# Patient Record
Sex: Female | Born: 1945
Health system: Southern US, Community
[De-identification: ages and names within clinical notes are randomized; demographics above are authoritative.]

## PROBLEM LIST (undated history)

## (undated) DIAGNOSIS — G4733 Obstructive sleep apnea (adult) (pediatric): Secondary | ICD-10-CM

## (undated) DIAGNOSIS — Z9989 Dependence on other enabling machines and devices: Secondary | ICD-10-CM

## (undated) DIAGNOSIS — Z7901 Long term (current) use of anticoagulants: Secondary | ICD-10-CM

## (undated) DIAGNOSIS — Z9889 Other specified postprocedural states: Secondary | ICD-10-CM

## (undated) DIAGNOSIS — C801 Malignant (primary) neoplasm, unspecified: Secondary | ICD-10-CM

## (undated) DIAGNOSIS — R112 Nausea with vomiting, unspecified: Secondary | ICD-10-CM

## (undated) DIAGNOSIS — K5792 Diverticulitis of intestine, part unspecified, without perforation or abscess without bleeding: Secondary | ICD-10-CM

## (undated) DIAGNOSIS — Z8541 Personal history of malignant neoplasm of cervix uteri: Secondary | ICD-10-CM

## (undated) DIAGNOSIS — I509 Heart failure, unspecified: Secondary | ICD-10-CM

## (undated) DIAGNOSIS — K219 Gastro-esophageal reflux disease without esophagitis: Secondary | ICD-10-CM

## (undated) DIAGNOSIS — Z8679 Personal history of other diseases of the circulatory system: Secondary | ICD-10-CM

## (undated) DIAGNOSIS — K449 Diaphragmatic hernia without obstruction or gangrene: Secondary | ICD-10-CM

## (undated) DIAGNOSIS — E119 Type 2 diabetes mellitus without complications: Secondary | ICD-10-CM

## (undated) DIAGNOSIS — Z853 Personal history of malignant neoplasm of breast: Secondary | ICD-10-CM

## (undated) DIAGNOSIS — M1990 Unspecified osteoarthritis, unspecified site: Secondary | ICD-10-CM

## (undated) DIAGNOSIS — Z85828 Personal history of other malignant neoplasm of skin: Secondary | ICD-10-CM

## (undated) DIAGNOSIS — J9601 Acute respiratory failure with hypoxia: Secondary | ICD-10-CM

## (undated) DIAGNOSIS — N189 Chronic kidney disease, unspecified: Secondary | ICD-10-CM

## (undated) DIAGNOSIS — N3020 Other chronic cystitis without hematuria: Secondary | ICD-10-CM

## (undated) DIAGNOSIS — E039 Hypothyroidism, unspecified: Secondary | ICD-10-CM

## (undated) DIAGNOSIS — Z794 Long term (current) use of insulin: Secondary | ICD-10-CM

## (undated) DIAGNOSIS — Z8719 Personal history of other diseases of the digestive system: Secondary | ICD-10-CM

## (undated) DIAGNOSIS — Z973 Presence of spectacles and contact lenses: Secondary | ICD-10-CM

## (undated) DIAGNOSIS — E785 Hyperlipidemia, unspecified: Secondary | ICD-10-CM

## (undated) DIAGNOSIS — N393 Stress incontinence (female) (male): Secondary | ICD-10-CM

## (undated) DIAGNOSIS — I739 Peripheral vascular disease, unspecified: Secondary | ICD-10-CM

## (undated) DIAGNOSIS — G629 Polyneuropathy, unspecified: Secondary | ICD-10-CM

## (undated) HISTORY — PX: ENDOVASCULAR STENT GRAFT (AAA): CATH118280

## (undated) HISTORY — DX: Acute respiratory failure with hypoxia: J96.01

## (undated) HISTORY — PX: TYMPANOPLASTY: SHX33

## (undated) HISTORY — PX: MASTECTOMY: SHX3

## (undated) HISTORY — PX: INCONTINENCE SURGERY: SHX676

## (undated) HISTORY — DX: Chronic kidney disease, unspecified: N18.9

## (undated) HISTORY — DX: Diverticulitis of intestine, part unspecified, without perforation or abscess without bleeding: K57.92

## (undated) HISTORY — DX: Gastro-esophageal reflux disease without esophagitis: K21.9

## (undated) HISTORY — PX: TONSILLECTOMY: SUR1361

## (undated) HISTORY — DX: Hyperlipidemia, unspecified: E78.5

---

## 1996-12-04 HISTORY — PX: WRIST GANGLION EXCISION: SUR520

## 1997-12-04 HISTORY — PX: CARPAL TUNNEL RELEASE: SHX101

## 1998-12-04 DIAGNOSIS — Z8541 Personal history of malignant neoplasm of cervix uteri: Secondary | ICD-10-CM

## 1998-12-04 DIAGNOSIS — I509 Heart failure, unspecified: Secondary | ICD-10-CM

## 1998-12-04 HISTORY — PX: ABDOMINAL HYSTERECTOMY: SHX81

## 1998-12-04 HISTORY — DX: Personal history of malignant neoplasm of cervix uteri: Z85.41

## 1998-12-04 HISTORY — DX: Heart failure, unspecified: I50.9

## 2011-06-26 DIAGNOSIS — R7302 Impaired glucose tolerance (oral): Secondary | ICD-10-CM | POA: Insufficient documentation

## 2011-09-15 DIAGNOSIS — Z8679 Personal history of other diseases of the circulatory system: Secondary | ICD-10-CM

## 2011-09-15 DIAGNOSIS — Z9889 Other specified postprocedural states: Secondary | ICD-10-CM | POA: Insufficient documentation

## 2011-09-15 HISTORY — DX: Other specified postprocedural states: Z98.890

## 2011-09-15 HISTORY — DX: Personal history of other diseases of the circulatory system: Z86.79

## 2011-11-23 ENCOUNTER — Encounter (INDEPENDENT_AMBULATORY_CARE_PROVIDER_SITE_OTHER): Payer: Medicare Other | Admitting: Family Medicine

## 2011-11-23 DIAGNOSIS — Z0000 Encounter for general adult medical examination without abnormal findings: Secondary | ICD-10-CM

## 2011-11-23 DIAGNOSIS — K219 Gastro-esophageal reflux disease without esophagitis: Secondary | ICD-10-CM

## 2011-11-23 DIAGNOSIS — M719 Bursopathy, unspecified: Secondary | ICD-10-CM

## 2011-12-16 ENCOUNTER — Ambulatory Visit (INDEPENDENT_AMBULATORY_CARE_PROVIDER_SITE_OTHER): Payer: Managed Care, Other (non HMO)

## 2011-12-16 DIAGNOSIS — E119 Type 2 diabetes mellitus without complications: Secondary | ICD-10-CM

## 2012-01-04 ENCOUNTER — Telehealth: Payer: Self-pay

## 2012-01-04 NOTE — Telephone Encounter (Signed)
OK to call in to pharmacy of patient's choice.  Metoprolol XR 25 mg 1 po qd #30 No rf and  Protonix 40mg  1 po qd #30 no rf.

## 2012-01-04 NOTE — Telephone Encounter (Signed)
.  [facility] PT [place] HER MEDS [place]  NEEDS ADDITIONAL PRESCRIPTION  [facility] [address] - [place]  CB [phone]

## 2012-01-04 NOTE — Telephone Encounter (Signed)
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  She needs her metoprolol XL 25mg  and protonix 40mg . Can we refill for her?  Chart at nurses desk

## 2012-01-04 NOTE — Telephone Encounter (Signed)
[facility] [address], [facility] notifying pt.

## 2012-01-07 ENCOUNTER — Telehealth: Payer: Self-pay

## 2012-01-07 NOTE — Telephone Encounter (Addendum)
PT REQUESTS RX FOR GLUCOSE METER AND STRIPS DOES NOT HAVE BRAND PREFERENCE   PT CALLED BACK TO LET us SHE USES THE TEST SCRIPTS TWICE A DAY. YOU MAY REACH HER AT [phone] IF NEEDED AND THE PHARMACY IS [facility] [address]

## 2012-01-07 NOTE — Telephone Encounter (Signed)
LMOM for patient to call back with which pharmacy she uses and how often she tests.

## 2012-01-08 NOTE — Telephone Encounter (Signed)
LMOM TO CB 

## 2012-01-08 NOTE — Telephone Encounter (Signed)
Please give OK for [place] to call in glucometer and test strips for pt.

## 2012-01-09 NOTE — Telephone Encounter (Signed)
Please call in glucometer of choice and appropriate strips so pt can test up to twice daily.  Thanks.

## 2012-01-10 NOTE — Telephone Encounter (Signed)
Called in Rx and spoke with pt to notify. D/W pt testing and BS levels. instr'd pt to write down readings and bring to OV w/ Dr Perrin Maltese. Pt agreed and will call if has questions before then.

## 2012-01-15 ENCOUNTER — Ambulatory Visit (INDEPENDENT_AMBULATORY_CARE_PROVIDER_SITE_OTHER): Payer: Managed Care, Other (non HMO) | Admitting: Family Medicine

## 2012-01-15 VITALS — BP 154/71 | HR 64 | Temp 98.0°F | Resp 16 | Ht 64.75 in | Wt 186.2 lb

## 2012-01-15 DIAGNOSIS — R309 Painful micturition, unspecified: Secondary | ICD-10-CM

## 2012-01-15 DIAGNOSIS — N390 Urinary tract infection, site not specified: Secondary | ICD-10-CM

## 2012-01-15 DIAGNOSIS — R300 Dysuria: Secondary | ICD-10-CM

## 2012-01-15 LAB — POCT UA - MICROSCOPIC ONLY
Casts, Ur, LPF, POC: NEGATIVE
Mucus, UA: NEGATIVE
Yeast, UA: NEGATIVE

## 2012-01-15 LAB — POCT URINALYSIS DIPSTICK
Spec Grav, UA: 1.02
Urobilinogen, UA: 0.2

## 2012-01-15 MED ORDER — SULFAMETHOXAZOLE-TRIMETHOPRIM 800-160 MG PO TABS: 1.0000 | ORAL_TABLET | Freq: Two times a day (BID) | ORAL | Status: AC

## 2012-01-15 NOTE — Patient Instructions (Signed)
Patient (or parent if minor) instructed to return to clinic or call if not better in 2 day(s). Sooner if worse.

## 2012-01-15 NOTE — Progress Notes (Signed)
Patient Name: Mandy Kim Date of Birth: 12-06-1945 Medical Record Number: [id number] Gender: female Date of Encounter: 01/15/2012  History of Present Illness:  Mandy Kim is a 66 y.o. very pleasant female patient who presents with the following:  Had a bladder tack 2 weeks ago by her Urologist, Dr. Wilburn Mylar in [place].  This was done ?due to frequent UTI.   Forgot to fill her septra rx-  Did well until she started to note UTI symptoms 4 days ago.  Hurts to urinate, blood in urine, urinary frequency.  These are her typical UTI symptoms.  No fever, nausea or vomiting.  Does note some right flank pain though.    There is no problem list on file for this patient.  No past medical history on file. No past surgical history on file. History  Substance Use Topics  . Smoking status: Never Smoker   . Smokeless tobacco: Not on file  . Alcohol Use: Not on file   No family history on file. Allergies  Allergen Reactions  . Aspirin   . Crestor (Rosuvastatin Calcium)   . Lipitor (Atorvastatin Calcium)   . Pravastatin   . Simvastatin     Medication list has been reviewed and updated.  Review of Systems: As per HPI- otherwise ok  Physical Examination: Filed Vitals:   01/15/12 0940  BP: 154/71  Pulse: 64  Temp: 98 F (36.7 C)  TempSrc: Oral  Resp: 16  Height: 5' 4.75" (1.645 m)  Weight: 186 lb 3.2 oz (84.46 kg)    Body mass index is 31.23 kg/(m^2).  GEN: WDWN, NAD, Non-toxic, A & O x 3, obese HEENT: Atraumatic, Normocephalic. Neck supple. No masses, No LAD. Ears and Nose: No external deformity. CV: RRR, No M/G/R. No JVD. No thrill. No extra heart sounds. PULM: CTA B, no wheezes, crackles, rhonchi. No retractions. No resp. distress. No accessory muscle use. ABD: S, NT, ND, +BS. No HSM.  No CVA tenderness EXTR: No c/c/e NEURO Normal gait.  PSYCH: Normally interactive. Conversant. Not depressed or anxious appearing.  Calm demeanor.   Results for orders  placed in visit on 01/15/12  POCT UA - MICROSCOPIC ONLY      Component Value Range   WBC, Ur, HPF, POC TNTC     RBC, urine, microscopic TNTC     Bacteria, U Microscopic 1+     Mucus, UA NEGATIVE     Epithelial cells, urine per micros 0-4     Crystals, Ur, HPF, POC NEGATIVE     Casts, Ur, LPF, POC NEGATIVE     Yeast, UA NEGATIVE    POCT URINALYSIS DIPSTICK      Component Value Range   Color, UA yellow     Clarity, UA cloudy     Glucose, UA negative     Bilirubin, UA negative     Ketones, UA negative     Spec Grav, UA 1.020     Blood, UA moderate     pH, UA 8.0     Protein, UA trace     Urobilinogen, UA 0.2     Nitrite, UA neative     Leukocytes, UA moderate (2+)       Assessment and Plan: 1. UTI (lower urinary tract infection)  sulfamethoxazole-trimethoprim (BACTRIM DS,SEPTRA DS) 800-160 MG per tablet, Urine culture  2. Painful urination  POCT UA - Microscopic Only, POCT Urinalysis Dipstick  patient with UTI after a recent bladder operation.  Will treat with antibiotics, urine culture pending. Instructed  her to let us know if she is not feeling better in short order!  Sooner if worse.

## 2012-01-17 ENCOUNTER — Encounter: Payer: Self-pay | Admitting: Family Medicine

## 2012-02-11 DIAGNOSIS — Z0271 Encounter for disability determination: Secondary | ICD-10-CM

## 2012-02-12 ENCOUNTER — Encounter: Payer: Self-pay | Admitting: Internal Medicine

## 2012-02-12 ENCOUNTER — Ambulatory Visit (INDEPENDENT_AMBULATORY_CARE_PROVIDER_SITE_OTHER): Payer: Managed Care, Other (non HMO) | Admitting: Internal Medicine

## 2012-02-12 DIAGNOSIS — E119 Type 2 diabetes mellitus without complications: Secondary | ICD-10-CM

## 2012-02-12 DIAGNOSIS — Z794 Long term (current) use of insulin: Secondary | ICD-10-CM

## 2012-02-12 DIAGNOSIS — E876 Hypokalemia: Secondary | ICD-10-CM

## 2012-02-12 DIAGNOSIS — E1142 Type 2 diabetes mellitus with diabetic polyneuropathy: Secondary | ICD-10-CM

## 2012-02-12 DIAGNOSIS — I714 Abdominal aortic aneurysm, without rupture, unspecified: Secondary | ICD-10-CM

## 2012-02-12 DIAGNOSIS — E785 Hyperlipidemia, unspecified: Secondary | ICD-10-CM

## 2012-02-12 DIAGNOSIS — I10 Essential (primary) hypertension: Secondary | ICD-10-CM

## 2012-02-12 DIAGNOSIS — Z79899 Other long term (current) drug therapy: Secondary | ICD-10-CM

## 2012-02-12 DIAGNOSIS — K760 Fatty (change of) liver, not elsewhere classified: Secondary | ICD-10-CM

## 2012-02-12 DIAGNOSIS — C50919 Malignant neoplasm of unspecified site of unspecified female breast: Secondary | ICD-10-CM | POA: Insufficient documentation

## 2012-02-12 DIAGNOSIS — C539 Malignant neoplasm of cervix uteri, unspecified: Secondary | ICD-10-CM

## 2012-02-12 DIAGNOSIS — R5381 Other malaise: Secondary | ICD-10-CM

## 2012-02-12 HISTORY — DX: Abdominal aortic aneurysm, without rupture, unspecified: I71.40

## 2012-02-12 HISTORY — DX: Type 2 diabetes mellitus with diabetic polyneuropathy: E11.42

## 2012-02-12 HISTORY — DX: Malignant neoplasm of unspecified site of unspecified female breast: C50.919

## 2012-02-12 HISTORY — DX: Fatty (change of) liver, not elsewhere classified: K76.0

## 2012-02-12 HISTORY — DX: Long term (current) use of insulin: Z79.4

## 2012-02-12 LAB — GLUCOSE, POCT (MANUAL RESULT ENTRY): POC Glucose: 82

## 2012-02-12 LAB — POCT URINALYSIS DIPSTICK
Ketones, UA: NEGATIVE
Protein, UA: NEGATIVE
Spec Grav, UA: 1.025
pH, UA: 7

## 2012-02-12 LAB — POCT UA - MICROSCOPIC ONLY: Crystals, Ur, HPF, POC: NEGATIVE

## 2012-02-12 NOTE — Progress Notes (Signed)
  Subjective:    Patient ID: Mandy Kim, female    DOB: Sep 05, 1946, 66 y.o.   MRN: [id number]  HPI New pt. Multiple problems, see list Drives a truck, feels good. New dx of NIDDM on new metformin 500mg  qd.   Review of Systems Will sched CPE    Objective:   Physical Exam  Normal      Results for orders placed in visit on 02/12/12  POCT UA - MICROSCOPIC ONLY      Component Value Range   WBC, Ur, HPF, POC 1-6     RBC, urine, microscopic 0-3     Bacteria, U Microscopic trace     Mucus, UA trace     Epithelial cells, urine per micros 0-5     Crystals, Ur, HPF, POC neg     Casts, Ur, LPF, POC neg     Yeast, UA neg    POCT URINALYSIS DIPSTICK      Component Value Range   Color, UA yellow     Clarity, UA clear     Glucose, UA neg     Bilirubin, UA neg     Ketones, UA neg     Spec Grav, UA 1.025     Blood, UA trace     pH, UA 7.0     Protein, UA neg     Urobilinogen, UA 1.0     Nitrite, UA neg     Leukocytes, UA Trace    GLUCOSE, POCT (MANUAL RESULT ENTRY)      Component Value Range   POC Glucose 82    POCT GLYCOSYLATED HEMOGLOBIN (HGB A1C)      Component Value Range   Hemoglobin A1C 6.5      Assessment & Plan:   Schedule CPE Refill all meds and test strips prn

## 2012-02-13 LAB — LIPID PANEL
Cholesterol: 248 mg/dL — ABNORMAL HIGH (ref 0–200)
Total CHOL/HDL Ratio: 8.6 Ratio

## 2012-02-13 LAB — CBC WITH DIFFERENTIAL/PLATELET
Basophils Absolute: 0.1 10*3/uL (ref 0.0–0.1)
Basophils Relative: 1 % (ref 0–1)
Hemoglobin: 13.6 g/dL (ref 12.0–15.0)
MCHC: 32.2 g/dL (ref 30.0–36.0)
Monocytes Relative: 8 % (ref 3–12)
Neutro Abs: 2.8 10*3/uL (ref 1.7–7.7)
Neutrophils Relative %: 39 % — ABNORMAL LOW (ref 43–77)
Platelets: 271 10*3/uL (ref 150–400)

## 2012-02-13 LAB — COMPREHENSIVE METABOLIC PANEL
AST: 45 U/L — ABNORMAL HIGH (ref 0–37)
Albumin: 5.1 g/dL (ref 3.5–5.2)
BUN: 19 mg/dL (ref 6–23)
Calcium: 9.8 mg/dL (ref 8.4–10.5)
Chloride: 107 mEq/L (ref 96–112)
Glucose, Bld: 92 mg/dL (ref 70–99)
Potassium: 4.2 mEq/L (ref 3.5–5.3)

## 2012-02-13 LAB — TSH: TSH: 2.406 u[IU]/mL (ref 0.350–4.500)

## 2012-02-19 ENCOUNTER — Encounter: Payer: Self-pay | Admitting: *Deleted

## 2012-02-22 ENCOUNTER — Ambulatory Visit: Payer: Managed Care, Other (non HMO)

## 2012-02-22 ENCOUNTER — Ambulatory Visit (INDEPENDENT_AMBULATORY_CARE_PROVIDER_SITE_OTHER): Payer: Managed Care, Other (non HMO) | Admitting: Family Medicine

## 2012-02-22 ENCOUNTER — Inpatient Hospital Stay (HOSPITAL_COMMUNITY)
Admission: EM | Admit: 2012-02-22 | Discharge: 2012-02-25 | DRG: 194 | Disposition: A | Discharge status: Home / Self Care | Payer: Managed Care, Other (non HMO) | Attending: Family Medicine | Admitting: Family Medicine

## 2012-02-22 VITALS — BP 118/71 | HR 98 | Temp 99.3°F | Resp 24 | Ht 64.0 in | Wt 169.8 lb

## 2012-02-22 DIAGNOSIS — J159 Unspecified bacterial pneumonia: Secondary | ICD-10-CM

## 2012-02-22 DIAGNOSIS — E119 Type 2 diabetes mellitus without complications: Secondary | ICD-10-CM

## 2012-02-22 DIAGNOSIS — J449 Chronic obstructive pulmonary disease, unspecified: Secondary | ICD-10-CM | POA: Diagnosis present

## 2012-02-22 DIAGNOSIS — J4489 Other specified chronic obstructive pulmonary disease: Secondary | ICD-10-CM | POA: Diagnosis present

## 2012-02-22 DIAGNOSIS — J438 Other emphysema: Secondary | ICD-10-CM

## 2012-02-22 DIAGNOSIS — J13 Pneumonia due to Streptococcus pneumoniae: Principal | ICD-10-CM | POA: Diagnosis present

## 2012-02-22 DIAGNOSIS — Z79899 Other long term (current) drug therapy: Secondary | ICD-10-CM

## 2012-02-22 DIAGNOSIS — R05 Cough: Secondary | ICD-10-CM

## 2012-02-22 DIAGNOSIS — E785 Hyperlipidemia, unspecified: Secondary | ICD-10-CM

## 2012-02-22 DIAGNOSIS — K7689 Other specified diseases of liver: Secondary | ICD-10-CM | POA: Diagnosis present

## 2012-02-22 DIAGNOSIS — I129 Hypertensive chronic kidney disease with stage 1 through stage 4 chronic kidney disease, or unspecified chronic kidney disease: Secondary | ICD-10-CM | POA: Diagnosis present

## 2012-02-22 DIAGNOSIS — J9690 Respiratory failure, unspecified, unspecified whether with hypoxia or hypercapnia: Secondary | ICD-10-CM

## 2012-02-22 DIAGNOSIS — N189 Chronic kidney disease, unspecified: Secondary | ICD-10-CM | POA: Diagnosis present

## 2012-02-22 DIAGNOSIS — J189 Pneumonia, unspecified organism: Secondary | ICD-10-CM

## 2012-02-22 DIAGNOSIS — G4733 Obstructive sleep apnea (adult) (pediatric): Secondary | ICD-10-CM

## 2012-02-22 DIAGNOSIS — E876 Hypokalemia: Secondary | ICD-10-CM | POA: Diagnosis present

## 2012-02-22 DIAGNOSIS — Z23 Encounter for immunization: Secondary | ICD-10-CM

## 2012-02-22 DIAGNOSIS — Z87891 Personal history of nicotine dependence: Secondary | ICD-10-CM

## 2012-02-22 DIAGNOSIS — Z8541 Personal history of malignant neoplasm of cervix uteri: Secondary | ICD-10-CM

## 2012-02-22 DIAGNOSIS — R634 Abnormal weight loss: Secondary | ICD-10-CM | POA: Diagnosis present

## 2012-02-22 DIAGNOSIS — R059 Cough, unspecified: Secondary | ICD-10-CM

## 2012-02-22 DIAGNOSIS — R0902 Hypoxemia: Secondary | ICD-10-CM

## 2012-02-22 DIAGNOSIS — E871 Hypo-osmolality and hyponatremia: Secondary | ICD-10-CM | POA: Diagnosis present

## 2012-02-22 DIAGNOSIS — Z7902 Long term (current) use of antithrombotics/antiplatelets: Secondary | ICD-10-CM

## 2012-02-22 DIAGNOSIS — Z853 Personal history of malignant neoplasm of breast: Secondary | ICD-10-CM

## 2012-02-22 DIAGNOSIS — E860 Dehydration: Secondary | ICD-10-CM

## 2012-02-22 HISTORY — DX: Malignant (primary) neoplasm, unspecified: C80.1

## 2012-02-22 LAB — POCT CBC
Granulocyte percent: 70.8 %G (ref 37–80)
HCT, POC: 40.9 % (ref 37.7–47.9)
Hemoglobin: 13.6 g/dL (ref 12.2–16.2)
Lymph, poc: 2.6 (ref 0.6–3.4)
MCH, POC: 28.9 pg (ref 27–31.2)
MCHC: 33.3 g/dL (ref 31.8–35.4)
MCV: 87 fL (ref 80–97)
MID (cbc): 0.8 (ref 0–0.9)
MPV: 9.6 fL (ref 0–99.8)
POC Granulocyte: 8.2 — AB (ref 2–6.9)
POC LYMPH PERCENT: 22.2 %L (ref 10–50)
POC MID %: 7 %M (ref 0–12)
Platelet Count, POC: 224 10*3/uL (ref 142–424)
RBC: 4.7 M/uL (ref 4.04–5.48)
RDW, POC: 16.5 %
WBC: 11.6 10*3/uL — AB (ref 4.6–10.2)

## 2012-02-22 LAB — CBC
Hemoglobin: 12.9 g/dL (ref 12.0–15.0)
MCHC: 34.4 g/dL (ref 30.0–36.0)
RBC: 4.4 MIL/uL (ref 3.87–5.11)
WBC: 10.2 10*3/uL (ref 4.0–10.5)

## 2012-02-22 LAB — BASIC METABOLIC PANEL
CO2: 21 mEq/L (ref 19–32)
Chloride: 100 mEq/L (ref 96–112)
GFR calc non Af Amer: 69 mL/min — ABNORMAL LOW (ref >90)
Glucose, Bld: 130 mg/dL — ABNORMAL HIGH (ref 70–99)
Potassium: 3.2 mEq/L — ABNORMAL LOW (ref 3.5–5.1)
Sodium: 131 mEq/L — ABNORMAL LOW (ref 135–145)

## 2012-02-22 LAB — GLUCOSE, CAPILLARY: Glucose-Capillary: 107 mg/dL — ABNORMAL HIGH (ref 70–99)

## 2012-02-22 LAB — GLUCOSE, POCT (MANUAL RESULT ENTRY): POC Glucose: 135

## 2012-02-22 IMAGING — CR DG CHEST 2V
2 series · 2 of 2 positions shown · non-contrast
Comparison: [DATE]

CLINICAL DATA: Cough.  Hypertension.  Diabetes.  History breast and
cervical cancer.

CHEST - 2 VIEW

[PA]
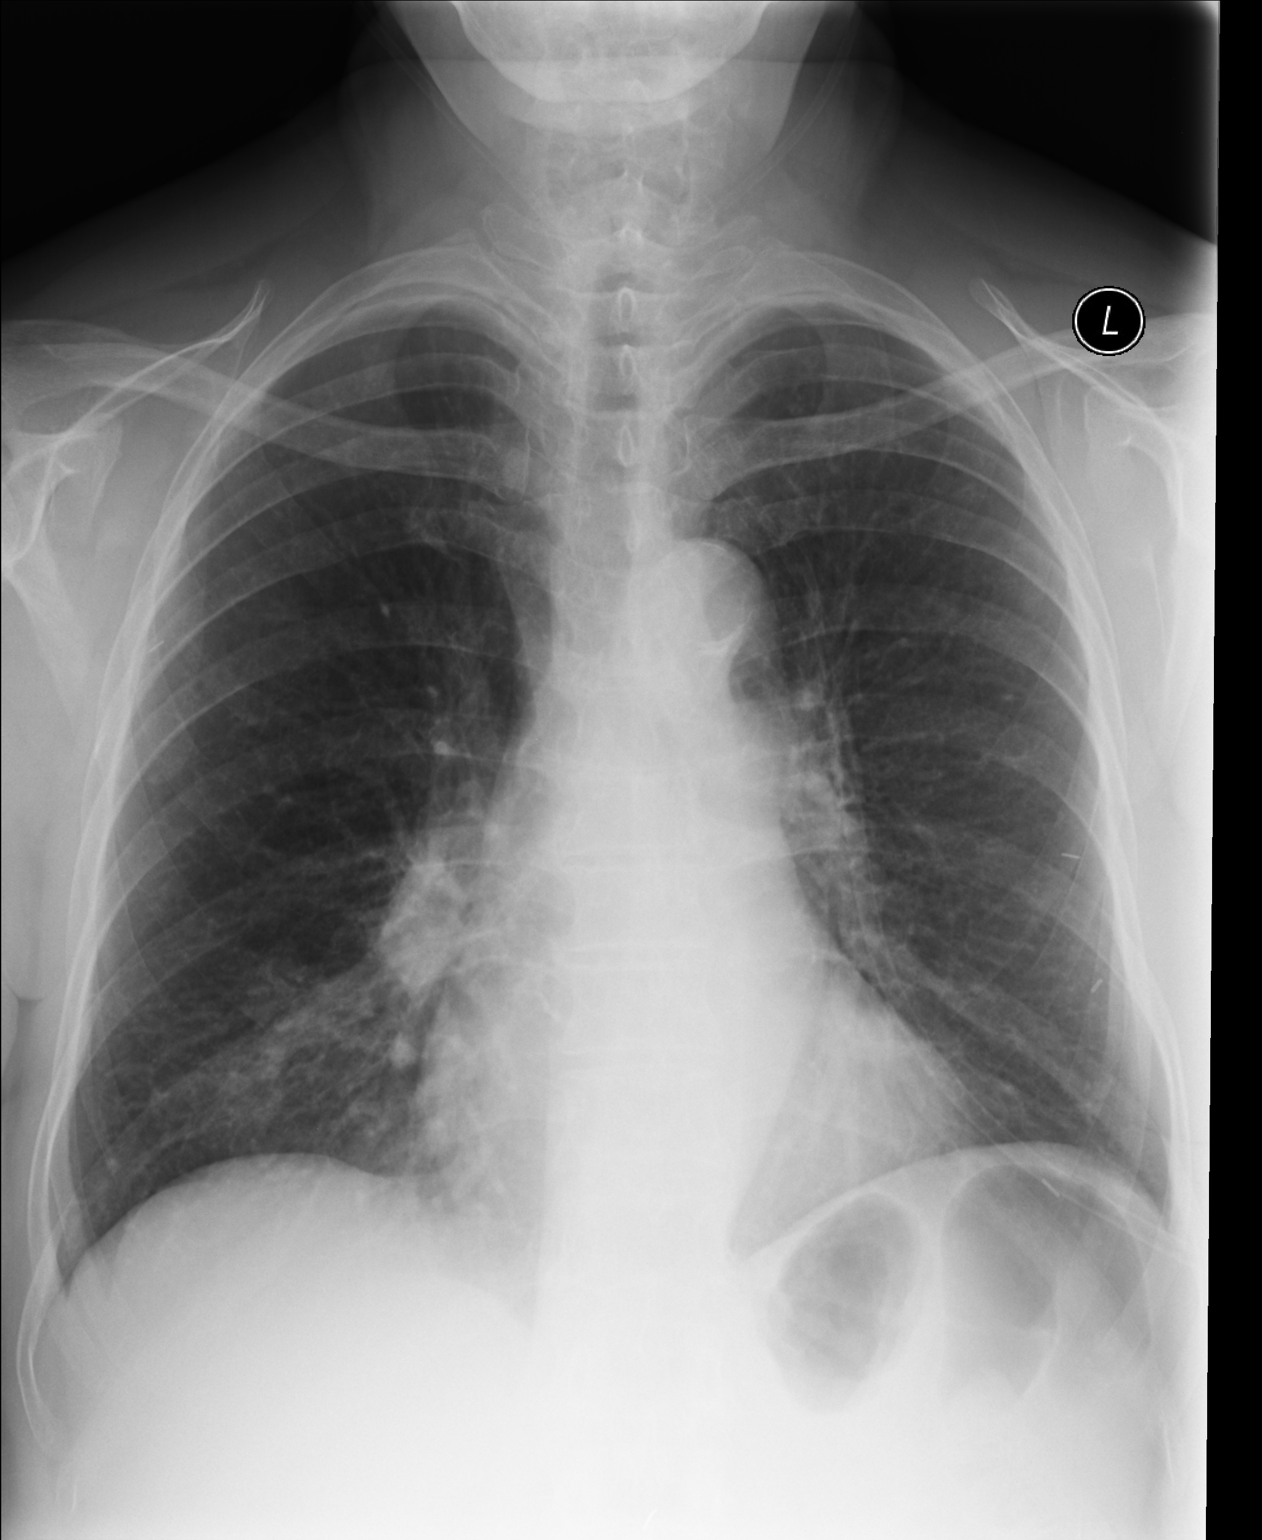

[lateral]
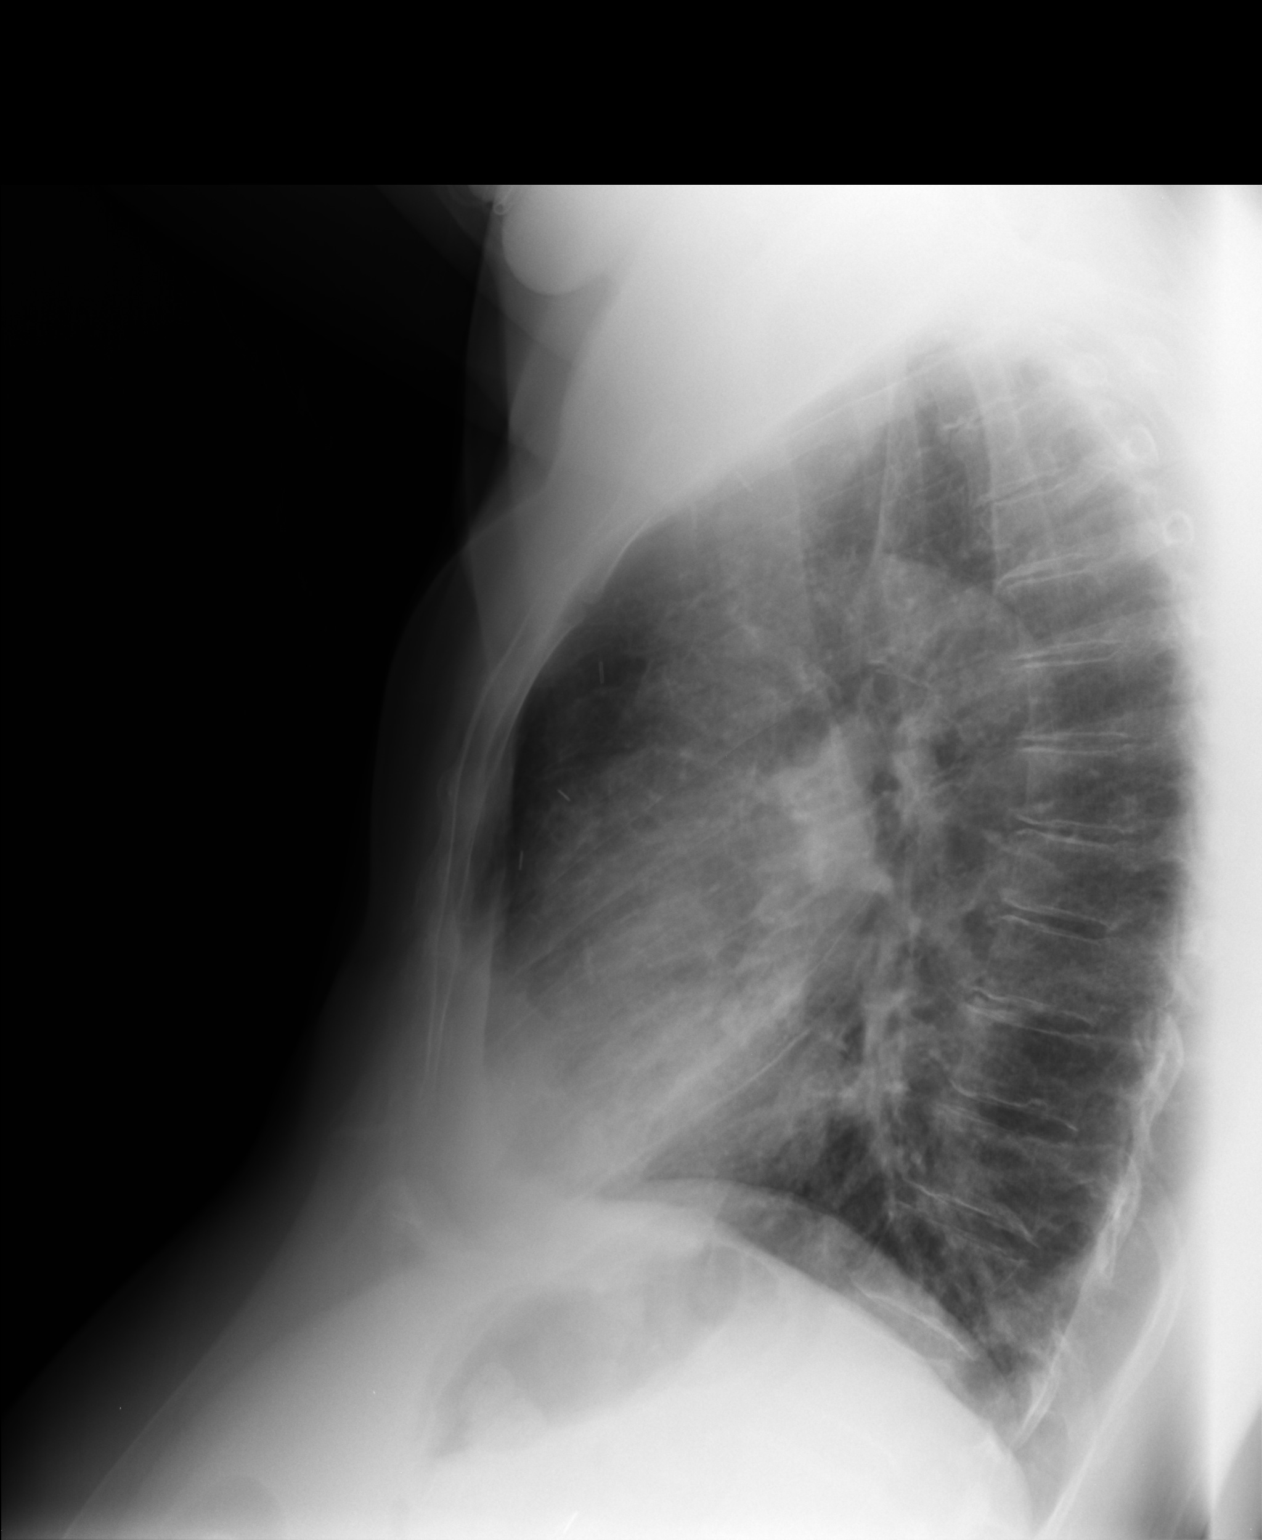

[2 of 2 positions shown; findings below may reference images not displayed]

FINDINGS: Surgical clips project over the right axilla and left
chest wall. Midline trachea.  Mild cardiomegaly with
atherosclerosis in the transverse aorta. No pleural effusion or
pneumothorax.  Right infrahilar soft tissue fullness suspected.
New patchy airspace disease in the right middle lobe.  Left lung
clear.
IMPRESSION: 1.  New patchy right middle lobe airspace disease, suspicious for
infection.
2.  Right infrahilar soft tissue fullness.  Cannot exclude a
central mass or adenopathy.  Antibiotic therapy with short-term
plain film follow-up versus further characterization with contrast
enhanced chest CT recommended.

This differs from the preliminary report by the clinical service,
which described only right lower lobe pneumonia. These results will
be called to the ordering clinician or representative by the
Radiologist Assistant, and communication documented in the PACS
Dashboard.

## 2012-02-22 MED ORDER — MOXIFLOXACIN HCL IN NACL 400 MG/250ML IV SOLN
400.0000 mg | Freq: Once | INTRAVENOUS | Status: AC
  Administered 2012-02-22: 400 mg via INTRAVENOUS
  Filled 2012-02-22: qty 250

## 2012-02-22 MED ORDER — ALBUTEROL SULFATE (5 MG/ML) 0.5% IN NEBU
5.0000 mg | INHALATION_SOLUTION | Freq: Once | RESPIRATORY_TRACT | Status: AC
  Administered 2012-02-22: 5 mg via RESPIRATORY_TRACT
  Filled 2012-02-22: qty 1

## 2012-02-22 MED ORDER — ACETAMINOPHEN 325 MG RE SUPP
RECTAL | Status: AC
  Filled 2012-02-22: qty 3

## 2012-02-22 MED ORDER — IPRATROPIUM BROMIDE 0.02 % IN SOLN: 0.5000 mg | Freq: Once | RESPIRATORY_TRACT | Status: DC

## 2012-02-22 NOTE — ED Provider Notes (Signed)
History     CSN: [id number]  Arrival date & time 02/22/12  1640   First MD Initiated Contact with Patient 02/22/12 1657      Chief Complaint  Patient presents with  . Shortness of Breath    (Consider location/radiation/quality/duration/timing/severity/associated sxs/prior treatment) HPI Pt presents with c/o cough, nasal congestion, generalized weakness over the past several days.  Symptoms have been continuous and worsening.  Pt went to her doctor's office and there presented with O2 sat of 80% on RA, CXR performed there shows RLL infiltrate.  Pt was transferred to ED via EMS.  Pt denies vomiting, no chest pain, no syncope.  Exertion makes symptoms worse.  There are no other systemic symptoms, there are no alleviating or modifying factors.   Past Medical History  Diagnosis Date  . Chronic kidney disease   . Hypertension   . Hyperlipidemia   . Diabetes mellitus     Past Surgical History  Procedure Date  . Abdominal aortic aneurysm repair     No family history on file.  History  Substance Use Topics  . Smoking status: Never Smoker   . Smokeless tobacco: Not on file  . Alcohol Use: Not on file    OB History    Grav Para Term Preterm Abortions TAB SAB Ect Mult Living                  Review of Systems ROS reviewed and otherwise negative except for mentioned in HPI  Allergies  Aspirin; Crestor; Lipitor; Pravastatin; and Simvastatin  Home Medications   Current Outpatient Rx  Name Route Sig Dispense Refill  . CALCIUM CITRATE-VITAMIN D 200-200 MG-UNIT PO TABS Oral Take 1 tablet by mouth daily.    Marland Kitchen VITAMIN D 1000 UNITS PO TABS Oral Take 1,000 Units by mouth daily.    Marland Kitchen CLOPIDOGREL BISULFATE 75 MG PO TABS Oral Take by mouth daily.    . OMEGA-3 FATTY ACIDS 1000 MG PO CAPS Oral Take 2 g by mouth daily.    Marland Kitchen GEMFIBROZIL 600 MG PO TABS Oral Take 600 mg by mouth 2 (two) times daily before a meal.    . METFORMIN HCL 500 MG PO TABS Oral Take 500 mg by mouth daily.     Marland Kitchen  METOPROLOL SUCCINATE ER 25 MG PO TB24 Oral Take 25 mg by mouth daily.    Marland Kitchen PANTOPRAZOLE SODIUM 40 MG PO TBEC Oral Take 40 mg by mouth daily.    Marland Kitchen POTASSIUM CHLORIDE CRYS ER 20 MEQ PO TBCR Oral Take 20 mEq by mouth 2 (two) times daily.    Marland Kitchen VITAMIN B-6 100 MG PO TABS Oral Take 100 mg by mouth daily.      BP 116/55  Pulse 86  Temp(Src) 98.9 F (37.2 C) (Axillary)  Resp 24  SpO2 92% Vitals reviewed Physical Exam Physical Examination: General appearance - alert, ill appearing, and in no acute distress Mental status - alert, oriented to person, place, and time Mouth - mucous membranes moist, pharynx normal without lesions Chest - coarse rhonchi bilaterally, right side greater than left, no wheezes, no increased respiratory effort or dyspnea Heart - normal rate, regular rhythm, normal S1, S2, no murmurs, rubs, clicks or gallops Abdomen - soft, nontender, nondistended, no masses or organomegaly, nabs Extremities - peripheral pulses normal, no pedal edema, no clubbing or cyanosis Skin - normal coloration and turgor, no rashes  ED Course  Procedures (including critical care time) \ 7:09 PM discussed with [facility] resident for admission.  Pt to be transferred to [facility].   Labs Reviewed  BASIC METABOLIC PANEL - Abnormal; Notable for the following:    Sodium 131 (*)    Potassium 3.2 (*)    Glucose, Bld 130 (*)    GFR calc non Af Amer 69 (*)    GFR calc Af Amer 80 (*)    All other components within normal limits  GLUCOSE, CAPILLARY - Abnormal; Notable for the following:    Glucose-Capillary 107 (*)    All other components within normal limits  CBC  CULTURE, BLOOD (ROUTINE X 2)  CULTURE, BLOOD (ROUTINE X 2)  INFLUENZA PANEL BY PCR   Dg Chest 2 View  02/22/2012  OVERREAD BY [facility] *RADIOLOGY REPORT*  Clinical Data: Cough.  Hypertension.  Diabetes.  History breast and cervical cancer.  CHEST - 2 VIEW  Comparison: 11/14/2009  Findings: Surgical  clips project over the right axilla and left chest wall. Midline trachea.  Mild cardiomegaly with atherosclerosis in the transverse aorta. No pleural effusion or pneumothorax.  Right infrahilar soft tissue fullness suspected. New patchy airspace disease in the right middle lobe.  Left lung clear.  IMPRESSION:  1.  New patchy right middle lobe airspace disease, suspicious for infection. 2.  Right infrahilar soft tissue fullness.  Cannot exclude a central mass or adenopathy.  Antibiotic therapy with short-term plain film follow-up versus further characterization with contrast enhanced chest CT recommended.  This differs from the preliminary report by the clinical service, which described only right lower lobe pneumonia. These results will be called to the ordering clinician or representative by the Radiologist Assistant, and communication documented in the PACS Dashboard.  Original Report Authenticated By: Consuello Bossier, M.D.     1. Community acquired pneumonia   2. Hypoxia       MDM  Patient presenting from her primary care doctor's office after being seen there for generalized weakness cough and shortness of breath with fever. Chest x-ray shows right lower lobe infiltrate. Patient is hypoxic at 83% on room air. Her oxygen saturation increased to the mid 90s on nasal cannula. She was started on antibiotics and arrangements were made for transfer to [facility] comes to family practice service        Ethelda Chick, MD 02/23/12 0000

## 2012-02-22 NOTE — ED Notes (Signed)
[facility] in process of assigning bed should be ready in about 5 min they said, attempted to call report but it was a dummy bed assigned.

## 2012-02-22 NOTE — ED Notes (Signed)
[id number]<AV> Expected date:<BR> Expected time:<BR> Means of arrival:<BR> Comments:<BR> Ems/ [facility]

## 2012-02-22 NOTE — ED Notes (Signed)
[id number]<YN> Expected date:<BR> Expected time: 4:37 PM<BR> Means of arrival:<BR> Comments:<BR> M32 - 60yoF Cough, fever,. Pneumonia by xray (transfer from [facility])

## 2012-02-22 NOTE — ED Notes (Signed)
Pt is going to [facility] when a bed becomes available and they are aware of what the wait is for

## 2012-02-22 NOTE — ED Notes (Signed)
At 2330 975 mg of Tylenol supp was scanned and showed up and patient was scanned but when came back to the computer it was not scanned

## 2012-02-22 NOTE — ED Notes (Signed)
Shortness of breath, cough, fever x 2 days, pt went to PMD at [facility], who sent her to ED via EMS for treatment. CXR and EKG done at [facility].

## 2012-02-22 NOTE — Progress Notes (Signed)
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  She continues to fall asleep.  Brought in by daughter acutely ill.  Decreased appetite.  O:  Acutely ill elderly woman with tachypnea and somnolence.  Brought back urgently Very deep congested cough Pulse ox 80% TM's neg Oroph:  Clear Chest:  Decreased bs on right Heart:  S3 gallop, tachycardic Ext:  No edema, good pedal pulses 3:45 hx and phys, O2 started 3:50 EKG 3:55 IV started 3:56 pulse Ox 93%, pulse 83 3:39 blood drawn  4:05 to x-ray [facility] reading (PRIMARY) by  Dr. Milus Glazier: CXR. RLL infiltrate  Results for orders placed in visit on 02/22/12  POCT CBC      Component Value Range   WBC 11.6 (*) 4.6 - 10.2 (K/uL)   Lymph, poc 2.6  0.6 - 3.4    POC LYMPH PERCENT 22.2  10 - 50 (%L)   MID (cbc) 0.8  0 - 0.9    POC MID % 7.0  0 - 12 (%M)   POC Granulocyte 8.2 (*) 2 - 6.9    Granulocyte percent 70.8  37 - 80 (%G)   RBC 4.70  4.04 - 5.48 (M/uL)   Hemoglobin 13.6  12.2 - 16.2 (g/dL)   HCT, POC 40.9  81.1 - 47.9 (%)   MCV 87.0  80 - 97 (fL)   MCH, POC 28.9  27 - 31.2 (pg)   MCHC 33.3  31.8 - 35.4 (g/dL)   RDW, POC 91.4     Platelet Count, POC 224  142 - 424 (K/uL)   MPV 9.6  0 - 99.8 (fL)  GLUCOSE, POCT (MANUAL RESULT ENTRY)      Component Value Range   POC Glucose 135      A:  RLL pneumonia, dehydration, resp failure  P:  EMT to ED stat

## 2012-02-23 ENCOUNTER — Inpatient Hospital Stay (HOSPITAL_COMMUNITY): Payer: Managed Care, Other (non HMO)

## 2012-02-23 ENCOUNTER — Encounter (HOSPITAL_COMMUNITY): Payer: Self-pay | Admitting: Family Medicine

## 2012-02-23 LAB — GLUCOSE, CAPILLARY
Glucose-Capillary: 114 mg/dL — ABNORMAL HIGH (ref 70–99)
Glucose-Capillary: 109 mg/dL — ABNORMAL HIGH (ref 70–99)
Glucose-Capillary: 104 mg/dL — ABNORMAL HIGH (ref 70–99)

## 2012-02-23 LAB — BASIC METABOLIC PANEL
BUN: 10 mg/dL (ref 6–23)
CO2: 22 mEq/L (ref 19–32)
Chloride: 107 mEq/L (ref 96–112)
Creatinine, Ser: 0.77 mg/dL (ref 0.50–1.10)

## 2012-02-23 LAB — HIV ANTIBODY (ROUTINE TESTING W REFLEX): HIV: NONREACTIVE

## 2012-02-23 LAB — CBC
HCT: 33.5 % — ABNORMAL LOW (ref 36.0–46.0)
MCHC: 34 g/dL (ref 30.0–36.0)
MCV: 85.7 fL (ref 78.0–100.0)
RDW: 14.6 % (ref 11.5–15.5)

## 2012-02-23 LAB — INFLUENZA PANEL BY PCR (TYPE A & B): H1N1 flu by pcr: NOT DETECTED

## 2012-02-23 IMAGING — CT CT CHEST W/O CM
3 of 4 series · 17 of 30 positions shown, 19 images · non-contrast
Comparison: Plain films of the chest [DATE] and [DATE].

CLINICAL DATA: Possible pulmonary nodule by chest film.

CT CHEST WITHOUT CONTRAST
TECHNIQUE: Multidetector CT imaging of the chest was performed
following the standard protocol without IV contrast.

[Series 2: routine chest · axial · 0.73mm/px · z∈[-304,-99]mm · 5 of 69 slices shown, 7 images]
[im 14/69  mediastinal]
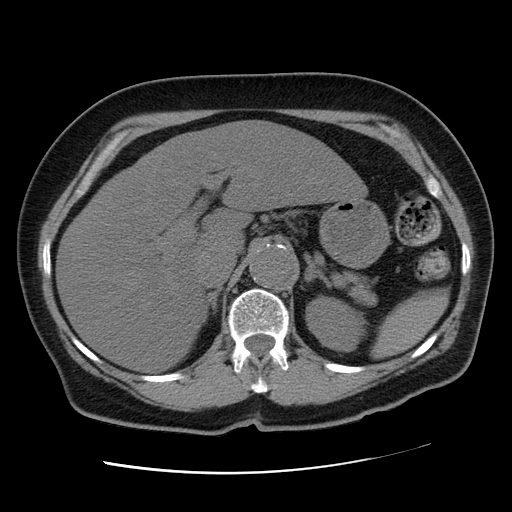
[im 14/69  lung]
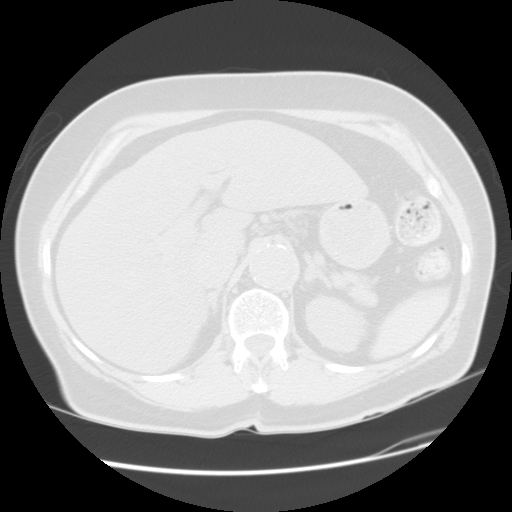
[im 28/69  lung]
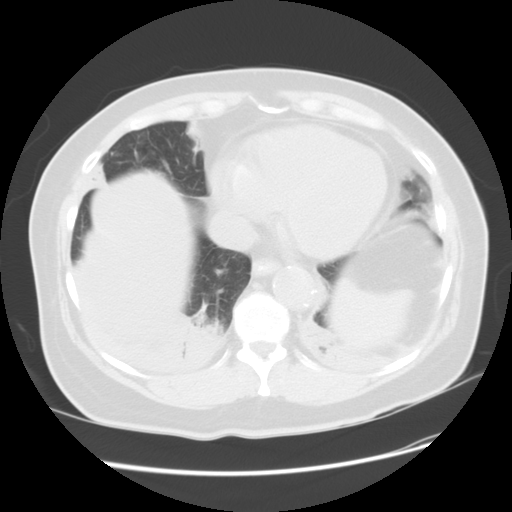
[im 37/69  lung]
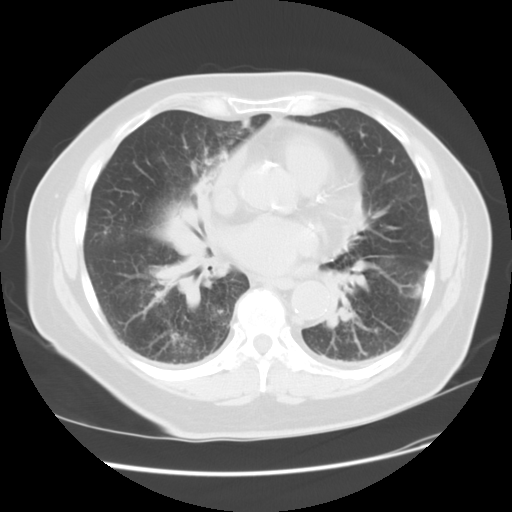
[im 41/69  lung]
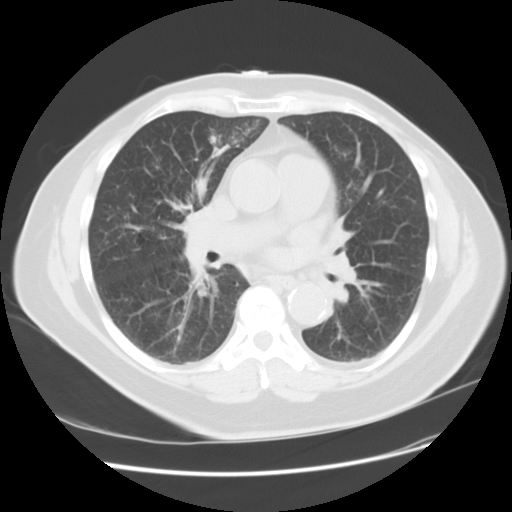
[im 55/69  mediastinal]
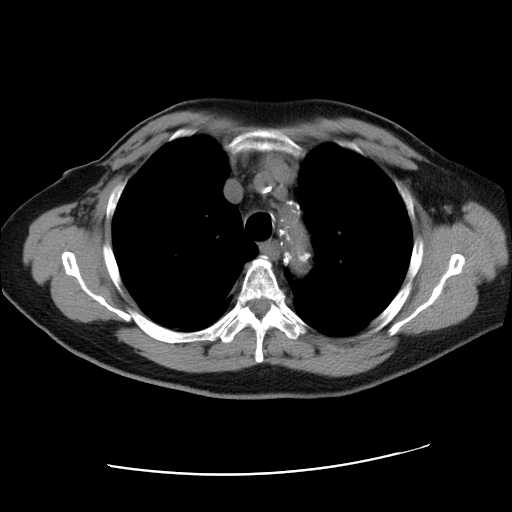
[im 55/69  lung]
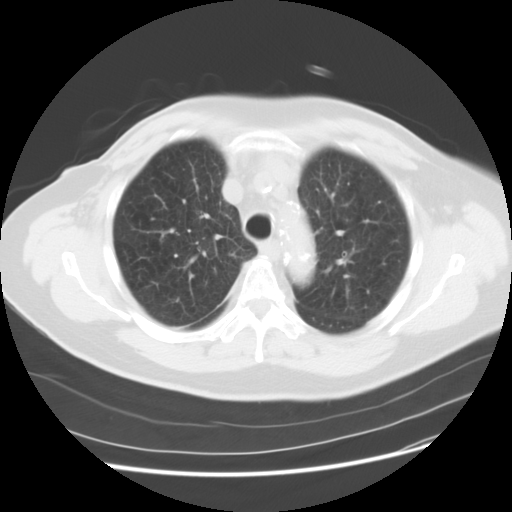

[Series 3: recon 2: routine chest · axial · 0.73mm/px · z∈[-234,-104]mm · 4 of 53 slices shown]
[im 14/53  lung]
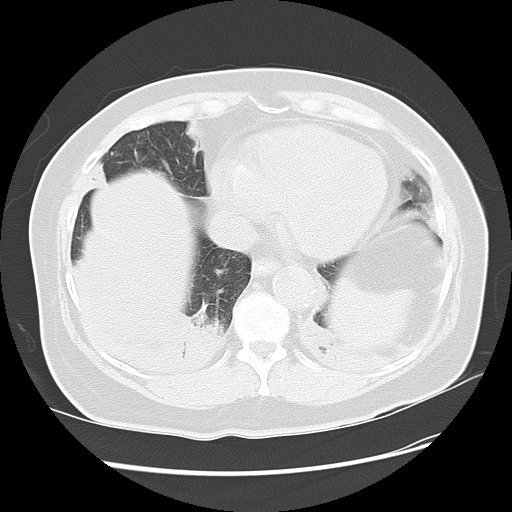
[im 23/53  lung]
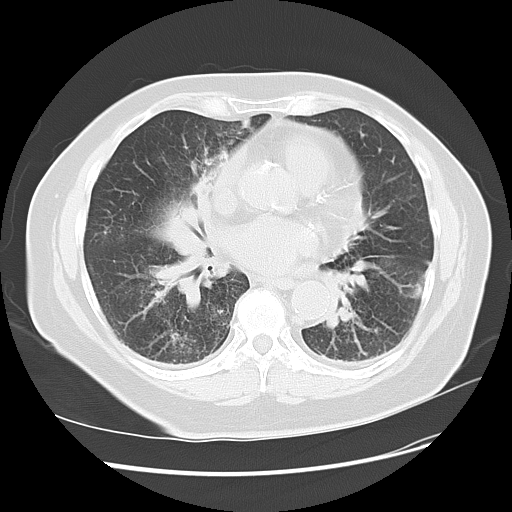
[im 27/53  lung]
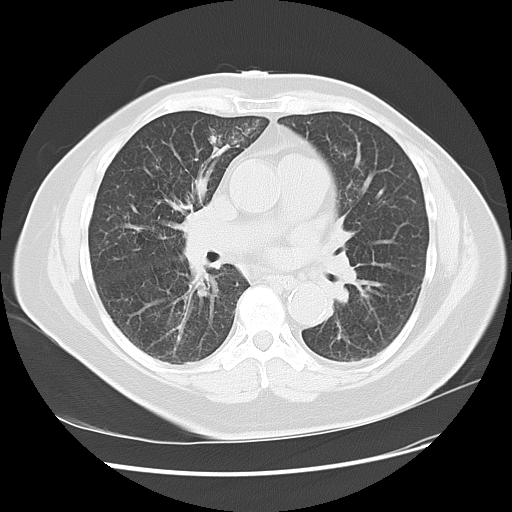
[im 40/53  lung]
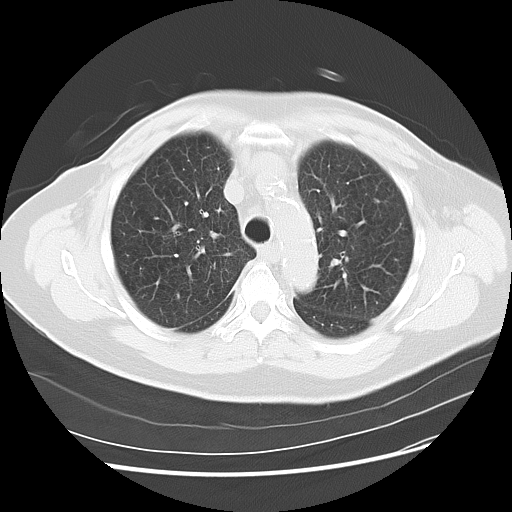

[Series 401: sagittals · sagittal · 0.73mm/px · 8 of 107 slices shown]
[im 12/107  lung]
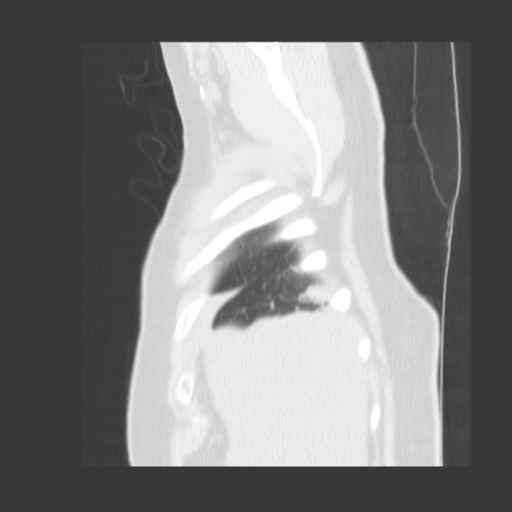
[im 24/107  lung]
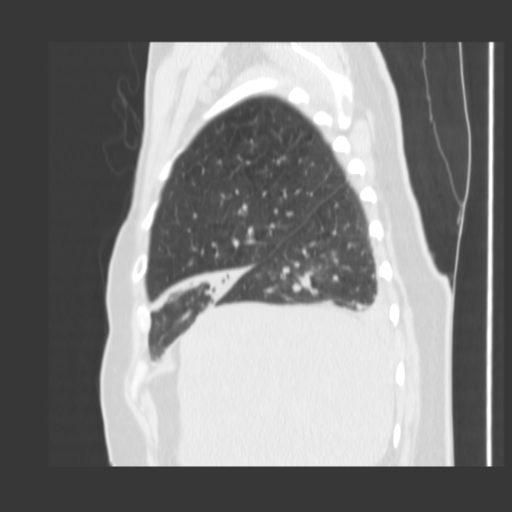
[im 36/107  lung]
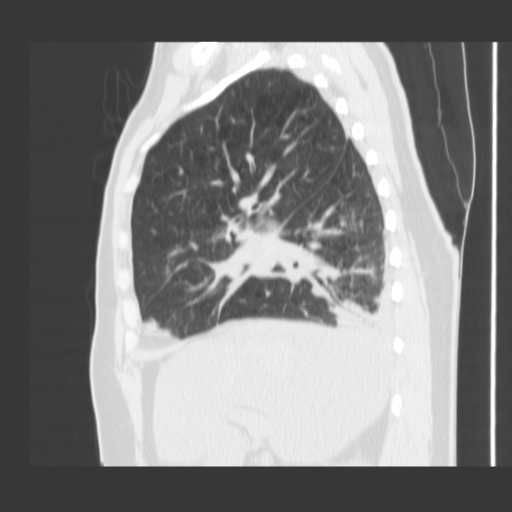
[im 48/107  lung]
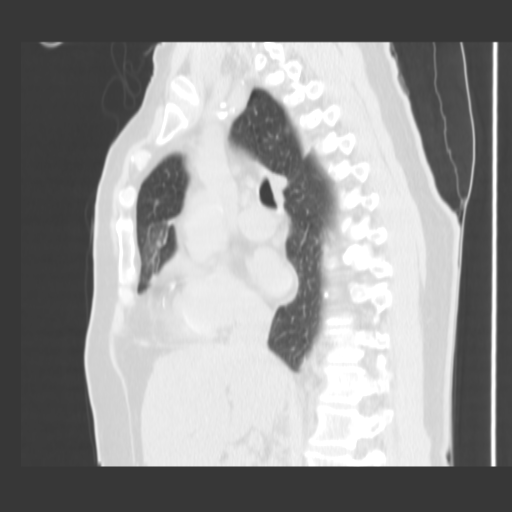
[im 59/107  lung]
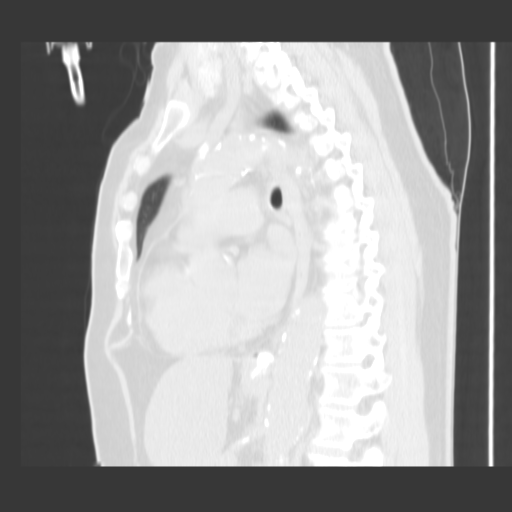
[im 71/107  lung]
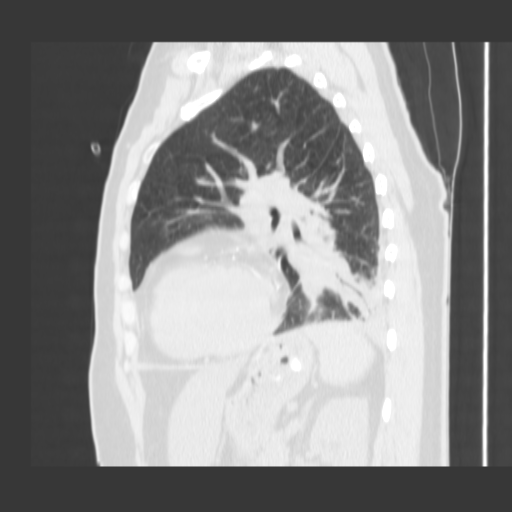
[im 83/107  lung]
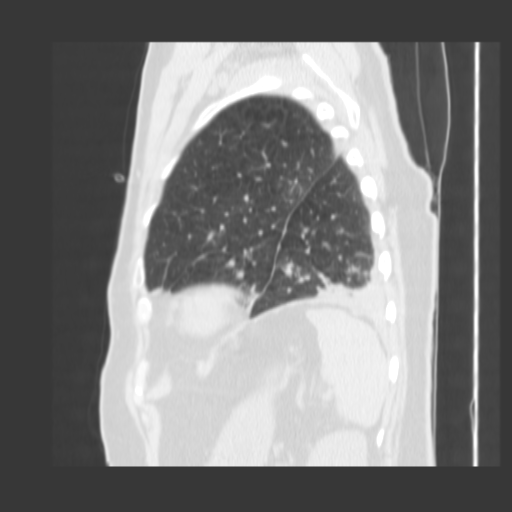
[im 95/107  lung]
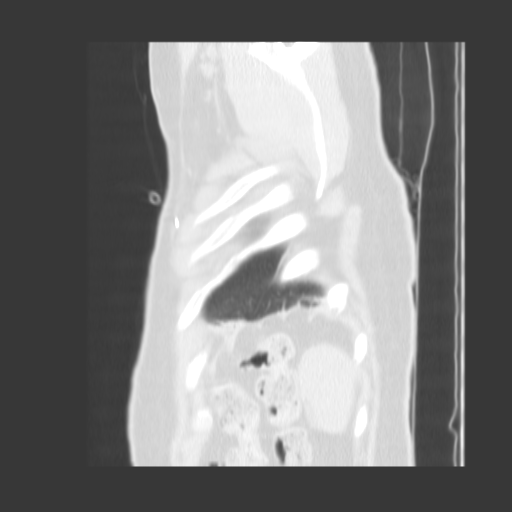

[17 of 30 positions shown; findings below may reference images not displayed]

FINDINGS: Surgical clips in the anterior left chest noted.  Heart
size is upper normal.  No pleural or pericardial effusion.  There
is no axillary, hilar or mediastinal lymphadenopathy.

The patient has marked centrilobular emphysema.  Bilateral airspace
disease is present and most confluent in the right middle lobe and
both lower lobes.  The appearance is most in keeping with
bronchopneumonia.  No nodule or mass is identified.

Incidentally imaged upper abdomen demonstrates low attenuation
throughout the visualized liver.  There is partial visualization of
an aortic stent graft.  No focal bony abnormality is identified.
IMPRESSION: 1.  Bilateral airspace disease worst in the right middle and both
lower lobes most consistent with bronchopneumonia. Recommend follow-
up films to clearing.
2.  Marked emphysema.
3.  Fatty infiltration of the liver.

## 2012-02-23 MED ORDER — ACETAMINOPHEN 325 MG PO TABS
650.0000 mg | ORAL_TABLET | Freq: Four times a day (QID) | ORAL | Status: DC | PRN
  Administered 2012-02-23 – 2012-02-24 (×2): 650 mg via ORAL
  Filled 2012-02-23 (×2): qty 2

## 2012-02-23 MED ORDER — VITAMIN D3 25 MCG (1000 UNIT) PO TABS
1000.0000 [IU] | ORAL_TABLET | Freq: Every day | ORAL | Status: DC
  Administered 2012-02-23 – 2012-02-25 (×3): 1000 [IU] via ORAL
  Filled 2012-02-23 (×3): qty 1

## 2012-02-23 MED ORDER — CLOPIDOGREL BISULFATE 75 MG PO TABS
75.0000 mg | ORAL_TABLET | Freq: Every day | ORAL | Status: DC
  Administered 2012-02-23 – 2012-02-25 (×3): 75 mg via ORAL
  Filled 2012-02-23 (×3): qty 1

## 2012-02-23 MED ORDER — CALCIUM CITRATE 950 (200 CA) MG PO TABS
200.0000 mg | ORAL_TABLET | Freq: Every day | ORAL | Status: DC
  Administered 2012-02-23 – 2012-02-25 (×3): 200 mg via ORAL
  Filled 2012-02-23 (×3): qty 1

## 2012-02-23 MED ORDER — INSULIN ASPART 100 UNIT/ML ~~LOC~~ SOLN: 0.0000 [IU] | Freq: Three times a day (TID) | SUBCUTANEOUS | Status: DC

## 2012-02-23 MED ORDER — GEMFIBROZIL 600 MG PO TABS
600.0000 mg | ORAL_TABLET | Freq: Two times a day (BID) | ORAL | Status: DC
  Administered 2012-02-23 – 2012-02-25 (×5): 600 mg via ORAL
  Filled 2012-02-23 (×7): qty 1

## 2012-02-23 MED ORDER — VITAMIN B-6 100 MG PO TABS
100.0000 mg | ORAL_TABLET | Freq: Every day | ORAL | Status: DC
  Administered 2012-02-23 – 2012-02-25 (×3): 100 mg via ORAL
  Filled 2012-02-23 (×3): qty 1

## 2012-02-23 MED ORDER — CALCIUM CITRATE-VITAMIN D 200-200 MG-UNIT PO TABS: 1.0000 | ORAL_TABLET | Freq: Every day | ORAL | Status: DC

## 2012-02-23 MED ORDER — POTASSIUM CHLORIDE CRYS ER 20 MEQ PO TBCR
20.0000 meq | EXTENDED_RELEASE_TABLET | Freq: Two times a day (BID) | ORAL | Status: DC
  Administered 2012-02-23 – 2012-02-25 (×5): 20 meq via ORAL
  Filled 2012-02-23 (×8): qty 1

## 2012-02-23 MED ORDER — OMEGA-3 FATTY ACIDS 1000 MG PO CAPS: 2.0000 g | ORAL_CAPSULE | Freq: Every day | ORAL | Status: DC

## 2012-02-23 MED ORDER — PANTOPRAZOLE SODIUM 40 MG PO TBEC
40.0000 mg | DELAYED_RELEASE_TABLET | Freq: Every day | ORAL | Status: DC
  Administered 2012-02-23 – 2012-02-24 (×2): 40 mg via ORAL
  Filled 2012-02-23 (×2): qty 1

## 2012-02-23 MED ORDER — POTASSIUM CHLORIDE CRYS ER 20 MEQ PO TBCR
EXTENDED_RELEASE_TABLET | ORAL | Status: AC
  Filled 2012-02-23: qty 1

## 2012-02-23 MED ORDER — MOXIFLOXACIN HCL 400 MG PO TABS
400.0000 mg | ORAL_TABLET | Freq: Every day | ORAL | Status: DC
  Administered 2012-02-23 – 2012-02-24 (×2): 400 mg via ORAL
  Filled 2012-02-23 (×3): qty 1

## 2012-02-23 MED ORDER — POTASSIUM CHLORIDE IN NACL 20-0.9 MEQ/L-% IV SOLN
INTRAVENOUS | Status: DC
  Administered 2012-02-23 – 2012-02-24 (×3): via INTRAVENOUS
  Filled 2012-02-23 (×6): qty 1000

## 2012-02-23 MED ORDER — HEPARIN SODIUM (PORCINE) 5000 UNIT/ML IJ SOLN
5000.0000 [IU] | Freq: Three times a day (TID) | INTRAMUSCULAR | Status: DC
  Administered 2012-02-23 – 2012-02-25 (×7): 5000 [IU] via SUBCUTANEOUS
  Filled 2012-02-23 (×10): qty 1

## 2012-02-23 MED ORDER — METOPROLOL SUCCINATE 12.5 MG HALF TABLET
12.5000 mg | ORAL_TABLET | Freq: Every day | ORAL | Status: DC
  Administered 2012-02-23 – 2012-02-25 (×3): 12.5 mg via ORAL
  Filled 2012-02-23 (×3): qty 1

## 2012-02-23 MED ORDER — SODIUM CHLORIDE 0.9 % IV BOLUS (SEPSIS)
1000.0000 mL | Freq: Once | INTRAVENOUS | Status: AC
  Administered 2012-02-23: 1000 mL via INTRAVENOUS

## 2012-02-23 MED ORDER — OMEGA-3-ACID ETHYL ESTERS 1 G PO CAPS
1.0000 g | ORAL_CAPSULE | Freq: Every day | ORAL | Status: DC
  Administered 2012-02-23 – 2012-02-25 (×3): 1 g via ORAL
  Filled 2012-02-23 (×3): qty 1

## 2012-02-23 MED ORDER — POTASSIUM CHLORIDE CRYS ER 20 MEQ PO TBCR
40.0000 meq | EXTENDED_RELEASE_TABLET | Freq: Once | ORAL | Status: AC
  Administered 2012-02-23: 40 meq via ORAL

## 2012-02-23 NOTE — Progress Notes (Signed)
Physical Therapy Evaluation Patient Details Name: Mandy Kim MRN: [id number] DOB: [birth year] Today's Date: 02/23/2012  Problem List:  Patient Active Problem List  Diagnoses  . Other and unspecified hyperlipidemia  . AAA (abdominal aortic aneurysm)  . Fatty liver  . Breast cancer  . Cervical cancer  . Diabetes mellitus  . Hyperlipidemia    Past Medical History:  Past Medical History  Diagnosis Date  . Chronic kidney disease   . Hypertension   . Hyperlipidemia   . Diabetes mellitus   . Cancer    Past Surgical History:  Past Surgical History  Procedure Date  . Abdominal aortic aneurysm repair   . Mastectomy   . Tonsillectomy   . Bladder tacking     PT Assessment/Plan/Recommendation PT Assessment Clinical Impression Statement: pt presents with PNA and Hypoxia.  pt very motivated to return to PLOF and only limited by respiratory status.   PT Recommendation/Assessment: Patient will need skilled PT in the acute care venue PT Problem List: Decreased activity tolerance;Decreased balance;Decreased knowledge of use of DME;Cardiopulmonary status limiting activity Barriers to Discharge: None PT Therapy Diagnosis : Difficulty walking (Decondition/Debility) PT Plan PT Frequency: Min 3X/week PT Treatment/Interventions: DME instruction;Gait training;Stair training;Functional mobility training;Therapeutic activities;Therapeutic exercise;Balance training;Patient/family education PT Recommendation Follow Up Recommendations: Outpatient PT;No PT follow up (Pending progress may not need f/u.  ) Equipment Recommended: None recommended by PT PT Goals  Acute Rehab PT Goals PT Goal Formulation: With patient Time For Goal Achievement: 2 weeks Pt will go Supine/Side to Sit: Independently PT Goal: Supine/Side to Sit - Progress: Goal set today Pt will go Sit to Supine/Side: Independently PT Goal: Sit to Supine/Side - Progress: Goal set today Pt will go Sit to Stand: with modified  independence;with upper extremity assist PT Goal: Sit to Stand - Progress: Goal set today Pt will go Stand to Sit: with modified independence PT Goal: Stand to Sit - Progress: Goal set today Pt will Ambulate: >150 feet;with modified independence PT Goal: Ambulate - Progress: Goal set today Pt will Go Up / Down Stairs: 3-5 stairs;with supervision PT Goal: Up/Down Stairs - Progress: Goal set today  PT Evaluation Precautions/Restrictions  Precautions Precautions:  (O2 sats) Precaution Comments: pt on Venti mask 15L 50% Restrictions Weight Bearing Restrictions: No Prior Functioning  Home Living Lives With: Spouse Receives Help From: Family Type of Home: House Home Layout: One level Home Access: Stairs to enter [employer] of Steps: couple Home Adaptive Equipment: None Prior Function Level of Independence: Independent with basic ADLs;Independent with homemaking with ambulation;Independent with gait;Independent with transfers Able to Take Stairs?: Yes Driving: Yes Vocation: Full time employment 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, [employer] Service Overall Cognitive Status: Appears within functional limits for tasks assessed Sensation/Coordination   Extremity Assessment RUE Assessment RUE Assessment: Within Functional Limits LUE Assessment LUE Assessment: Within Functional Limits RLE Assessment RLE Assessment: Within Functional Limits LLE Assessment LLE Assessment: Within Functional Limits Mobility (including Balance) Bed Mobility Bed Mobility: Yes Supine to Sit: 5: Supervision;With rails Supine to Sit Details (indicate cue type and reason): increased time Sitting - Scoot to Edge of Bed: 6: Modified independent (Device/Increase time) Transfers Transfers: Yes Sit to Stand: 5: Supervision;With upper extremity assist;From bed;From toilet Sit to Stand Details (indicate cue type and reason): cues for use of UEs and close  Supervision for safety Stand to Sit: 4: Min assist;With upper extremity assist;To bed;To toilet Stand to Sit Details: cues for use of grab bar in bathroom and use of  UEs to control descent Ambulation/Gait Ambulation/Gait: Yes Ambulation/Gait Assistance: 4: Min assist Ambulation/Gait Assistance Details (indicate cue type and reason): pt moves slowly and cautiously.  pt on Venti mask 15L at 50% O2.  pt sats 96-97% Ambulation Distance (Feet): 250 Feet Assistive device: None Gait Pattern: Step-through pattern;Decreased stride length;Shuffle Stairs: No Wheelchair Mobility Wheelchair Mobility: No  Posture/Postural Control Posture/Postural Control: No significant limitations Balance Balance Assessed: No Exercise    End of Session PT - End of Session Equipment Utilized During Treatment: Gait belt Activity Tolerance: Patient tolerated treatment well Patient left: in bed;with call bell in reach;with family/visitor present (Sitting EOB) Nurse Communication: Mobility status for transfers;Mobility status for ambulation General Behavior During Session: [facility] for tasks performed Cognition: [facility] for tasks performed  Sunny Schlein, [place] [phone] 02/23/2012, 2:36 PM

## 2012-02-23 NOTE — H&P (Signed)
Annalia Metzger is an 66 y.o. female.   Chief Complaint: difficulty breathing HPI: This is a 66 year old Caucasian female presenting with difficulty breathing. The patient started feeling unwell on Sunday, 02/18/2012. She was feeling like she was about to "catch something" and had a runny nose. 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 When she returned on Tuesday, she was feeling worse and complaining of difficulty breathing, sore throat, rattling cough productive of white sputum, and fever She tried to go to the clinic of her PCP, to [facility] on [address], however, they were full, and she did not want to go to the Emergent Room. She was able to be seen there today and was diagnosed with pneumonia and advised to come to the ED. She went to [facility] where she was found to be hypoxic to 80% on room air. She does not have a history of respiratory problems, including asthma or COPD, however, she had smoked 1/2 to 1ppd for about 30 years a few decades ago. She was started on moxifloxacin in the ED, and her saturations have been in the low to mid-90s with the mask.   The patient denies any history of difficulty breathing in the past.  She reports a 20lb weight loss over the past several months. Her previous weight has been about 193lb. She was diagnosed with diabetes a few months ago and started on metformin.   She denies nausea/vomiting/constipation/diarrhea. She is complaining of an occipital headache that started during this illness. She denies any vision or hearing changes, confusion, or changes in gait. She denies any back or bony pain.  She does have a history of cervical cancer s/p hysterectomy and breast cancer s/p bilateral mastectomy and right axillary lymph node dissection.  She denies any blood in her sputum.   Past Medical History  Diagnosis Date  . Chronic kidney disease   . Hypertension   . Hyperlipidemia   . Diabetes mellitus   Fatty liver disease AAA s/p repair  recently. Patient had known AAA and underwent the repair since her job ([employer]) required AAA repair for aneurysms past 5.0cm and her was 5.1cm.  Cervical cancer Breast cancer  Past Surgical History  Procedure Date  . Abdominal aortic aneurysm repair   . Mastectomy   . Tonsillectomy   . Bladder tacking   Total hysterectomy Bilateral mastectomy  No family history on file. Social History:  reports that she has quit smoking. She does not have any smokeless tobacco history on file. Her alcohol and drug histories not on file.  Lives with husband in [place]. Occupation: truck Hospital doctor.  Allergies:  Allergies  Allergen Reactions  . Aspirin   . Crestor (Rosuvastatin Calcium)   . Lipitor (Atorvastatin Calcium)   . Pravastatin   . Simvastatin     Medications Prior to Admission  Medication Dose Route Frequency Provider Last Rate Last Dose  . acetaminophen (TYLENOL) 325 MG suppository           . albuterol (PROVENTIL) (5 MG/ML) 0.5% nebulizer solution 5 mg  5 mg Nebulization Once Ethelda Chick, MD   5 mg at 02/22/12 1830  . moxifloxacin (AVELOX) IVPB 400 mg  400 mg Intravenous Once Ethelda Chick, MD   400 mg at 02/22/12 2019  . DISCONTD: ipratropium (ATROVENT) nebulizer solution 0.5 mg  0.5 mg Nebulization Once Ethelda Chick, MD       Medications Prior to Admission  Medication Sig Dispense Refill  . calcium citrate-vitamin  D 200-200 MG-UNIT TABS Take 1 tablet by mouth daily.      . clopidogrel (PLAVIX) 75 MG tablet Take by mouth daily.      . fish oil-omega-3 fatty acids 1000 MG capsule Take 2 g by mouth daily.      Marland Kitchen gemfibrozil (LOPID) 600 MG tablet Take 600 mg by mouth 2 (two) times daily before a meal.      . metFORMIN (GLUCOPHAGE) 500 MG tablet Take 500 mg by mouth daily.       . metoprolol succinate (TOPROL-XL) 25 MG 24 hr tablet Take 25 mg by mouth daily.      . pantoprazole (PROTONIX) 40 MG tablet Take 40 mg by mouth daily.      . potassium chloride SA (K-DUR,KLOR-CON) 20  MEQ tablet Take 20 mEq by mouth 2 (two) times daily.      Marland Kitchen pyridOXINE (VITAMIN B-6) 100 MG tablet Take 100 mg by mouth daily.        Results for orders placed during the hospital encounter of 02/22/12 (from the past 48 hour(s))  CBC     Status: Normal   Collection Time   02/22/12  5:37 PM      Component Value Range Comment   WBC 10.2  4.0 - 10.5 (K/uL)    RBC 4.40  3.87 - 5.11 (MIL/uL)    Hemoglobin 12.9  12.0 - 15.0 (g/dL)    HCT 16.1  09.6 - 04.5 (%)    MCV 85.2  78.0 - 100.0 (fL)    MCH 29.3  26.0 - 34.0 (pg)    MCHC 34.4  30.0 - 36.0 (g/dL)    RDW 40.9  81.1 - 91.4 (%)    Platelets 212  150 - 400 (K/uL)   BASIC METABOLIC PANEL     Status: Abnormal   Collection Time   02/22/12  5:37 PM      Component Value Range Comment   Sodium 131 (*) 135 - 145 (mEq/L)    Potassium 3.2 (*) 3.5 - 5.1 (mEq/L)    Chloride 100  96 - 112 (mEq/L)    CO2 21  19 - 32 (mEq/L)    Glucose, Bld 130 (*) 70 - 99 (mg/dL)    BUN 13  6 - 23 (mg/dL)    Creatinine, Ser 7.82  0.50 - 1.10 (mg/dL)    Calcium 8.5  8.4 - 10.5 (mg/dL)    GFR calc non Af Amer 69 (*) >90 (mL/min)    GFR calc Af Amer 80 (*) >90 (mL/min)   GLUCOSE, CAPILLARY     Status: Abnormal   Collection Time   02/22/12 11:40 PM      Component Value Range Comment   Glucose-Capillary 107 (*) 70 - 99 (mg/dL)    Dg Chest 2 View  9/56/2130  OVERREAD BY [facility] *RADIOLOGY REPORT*  Clinical Data: Cough.  Hypertension.  Diabetes.  History breast and cervical cancer.  CHEST - 2 VIEW  Comparison: 11/14/2009  Findings: Surgical clips project over the right axilla and left chest wall. Midline trachea.  Mild cardiomegaly with atherosclerosis in the transverse aorta. No pleural effusion or pneumothorax.  Right infrahilar soft tissue fullness suspected. New patchy airspace disease in the right middle lobe.  Left lung clear.  IMPRESSION:  1.  New patchy right middle lobe airspace disease, suspicious for infection. 2.  Right infrahilar soft tissue  fullness.  Cannot exclude a central mass or adenopathy.  Antibiotic therapy with short-term plain film follow-up versus further  characterization with contrast enhanced chest CT recommended.  This differs from the preliminary report by the clinical service, which described only right lower lobe pneumonia. These results will be called to the ordering clinician or representative by the Radiologist Assistant, and communication documented in the PACS Dashboard.  Original Report Authenticated By: Consuello Bossier, M.D.    ROS See above, under HPI, with inclusion of following: Gen: denies myalgias CV: denies chest pain, palpitations Abd: denies abdominal pain Ext: denies swelling GU: denies dysuria/frequency/urgency  Blood pressure 98/52, pulse 70, temperature 99.1 F (37.3 C), temperature source Oral, resp. rate 24, height 5\' 4"  (1.626 m), weight 174 lb 8 oz (79.153 kg), SpO2 95.00%. Physical Exam  Gen: mild distress, loud ronchi, wearing VM, accompanied by husband HEENT: conjunctiva normal; PERRL; nasal congestion without active rhinorrhea; oropharyhnx erythematous (s/p tonsillectomy); no neck LAD; MMM CV: RRR, distant HS, difficult to hear over ronchi Pulm: ronchorous throughout, mildly increased WOB with supraclavicular retractions, no wheezes or obvious rales, decreased BS bilateral bases Abd: NABS, soft, NT, ND GU: deferred Ext: no edema Neuro: CN grossly intact; 5/5 strength upper and lower extremities; sensation intact throughout Psych: alert and oriented; appropriate to questions  Assessment/Plan This is a 66 year old Caucasian female with a history of AAA s/p repair, HTN, T2DM recently diagnosed and started on metformin, HLD, significant past smoking history, and history of breast and cervical cancer presenting with dyspnea/hypoxia, cough, fever to 102 on admission.  Dyspnea, hypoxia to 80% on RA on admission CXR on admission concerning for right middle lobe infiltrate suspicious for  infection, however, central mass/adenopathy cannot be ruled-out. Patient's CXR and symptoms consistent with upper respiratory infection and pneumonia, which would be community-acquired, with sepsis. Patient reports improvement of symptoms with albuterol nebs. -Continue moxifloxacin -Will need follow-up CXR/CT to rule-out possible malignancy. Patient has had breast and cervical cancer and does have a smoking history -Keep O2 saturations above 90% -Follow-up blood cultures and flu PCR -IS. PT.  -Per pneumonia protocol: HIV, sputum culture and GS, viral panel, adenovirus, Legionella Ab, Legionalla and strep urine (although patient has received dose of antibiotic) -CT chest  Recent 20lb weight loss May be due to starting medication for diabetes recently. However, will need to follow-up on pulmonary findings on CXR to rule-out malignancy.   PULM CAP History of OSA on noctural CPAP -See above -CPAP QHS  CV History of HTN, now hypotensive--SBP 90-110s. In clinic, 110-150s. History of HLD--LDL 178, HDL 20, TG 205 02/12/2012.  History of AAA s/p repair -Home medications: metoprolol 25, gemfibrozal>>>continue gemfibrozal, 1/2 dose of metoprolol for low pressures -Plavix (started after AAA repair) -1L fluid bolus now  ENDO History of T2DM -HgbA1c -SSI  HEME/ONC History of breast and cervical cancer s/p mastectomy and hysterectomy -CBC WNL. Monitor.   FEN/GI Hyponatremia Hypokalemia -Fluid bolus, MIVF, replete K -IVF: NS c 20 KCl @ maintenance -Diet: carbohydrate-modified diet  PPx -DVT PPx: heparin SQ -SUP: home Protonix  DISPO: pending clinical improvement  CODE: full   [place], [place] Rowe 02/23/2012, 12:58 AM

## 2012-02-23 NOTE — Progress Notes (Signed)
Pt had orders for bedrest with bathroom privilege, as well as ad lib activity.  Please clarify activity orders.  Pt stood EOB with OT during OT eval. Thanks, Lawson Fiscal Adrinne Sze,OT

## 2012-02-23 NOTE — Progress Notes (Signed)
Nutrition Brief Note  RD pulled to pt due to unintentional weight loss > 10 lbs within the past month per admission nutrition screen. Pt's family report this weight loss was intentional given diabetes medication initiation and controlling carbohydrate portions. Pt consuming 85-100% on a Carbohydrate Modified Medium Calorie diet. BMI = 30 kg/m2 (Obesity Class I). No nutrition intervention warranted at this time. Please consult RD as needed.  Alger Memos, RD Pager #: [phone]

## 2012-02-23 NOTE — Evaluation (Signed)
Occupational Therapy Evaluation Patient Details Name: Mandy Kim MRN: [id number] DOB: 29-Feb-1946 Today's Date: 02/23/2012  Problem List:  Patient Active Problem List  Diagnoses  . Other and unspecified hyperlipidemia  . AAA (abdominal aortic aneurysm)  . Fatty liver  . Breast cancer  . Cervical cancer  . Diabetes mellitus  . Hyperlipidemia    Past Medical History:  Past Medical History  Diagnosis Date  . Chronic kidney disease   . Hypertension   . Hyperlipidemia   . Diabetes mellitus   . Cancer    Past Surgical History:  Past Surgical History  Procedure Date  . Abdominal aortic aneurysm repair   . Mastectomy   . Tonsillectomy   . Bladder tacking     OT Assessment/Plan/Recommendation OT Assessment Clinical Impression Statement: Pt presents to OT with decreased I with ADL activity s/p hospitalization for pneumonia.  Pt will beneift from skilled OT to increase I with ADL activity and return to PLOF OT Recommendation/Assessment: Patient will need skilled OT in the acute care venue OT Problem List: Decreased strength;Decreased activity tolerance OT Therapy Diagnosis : Generalized weakness OT Plan OT Frequency: Min 2X/week OT Treatment/Interventions: Self-care/ADL training;Patient/family education;DME and/or AE instruction;Energy conservation OT Recommendation Equipment Recommended: Other (comment) (to be determined) Individuals Consulted Consulted and Agree with Results and Recommendations: Patient OT Goals Acute Rehab OT Goals OT Goal Formulation: With patient Time For Goal Achievement: 2 weeks ADL Goals Pt Will Perform Grooming: with supervision;Standing at sink ADL Goal: Grooming - Progress: Goal set today Pt Will Perform Lower Body Bathing: with supervision;Sit to stand from bed ADL Goal: Lower Body Bathing - Progress: Goal set today Pt Will Perform Lower Body Dressing: with supervision ADL Goal: Lower Body Dressing - Progress: Goal set today Pt Will  Transfer to Toilet: with supervision;Comfort height toilet ADL Goal: Toilet Transfer - Progress: Goal set today  OT Evaluation Precautions/Restrictions  Precautions Precautions: Other (comment) Precaution Comments: Pt wearing venti mask (50 percent) Prior Functioning Home Living Lives With: Spouse Prior Function Level of Independence: Independent with basic ADLs;Independent with homemaking with ambulation Driving: Yes Vocation: Full time employment 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 Eating/Feeding: Simulated;Set up Where Assessed - Eating/Feeding: Edge of bed Grooming: Simulated;Set up Where Assessed - Grooming: Sitting, bed;Unsupported Upper Body Bathing: Simulated;Minimal assistance Where Assessed - Upper Body Bathing: Unsupported;Sitting, bed Lower Body Bathing: Moderate assistance Where Assessed - Lower Body Bathing: Sit to stand from bed Upper Body Dressing: Simulated;Minimal assistance Lower Body Dressing: Performed;Moderate assistance Where Assessed - Lower Body Dressing: Sit to stand from bed Toilet Transfer: Simulated;Other (comment);Minimal assistance (sit to stand only) Toileting - Clothing Manipulation: Simulated;Minimal assistance Where Assessed - Glass blower/designer Manipulation: Standing Toileting - Hygiene: Simulated;Minimal assistance Where Assessed - Toileting Hygiene: Standing Ambulation Related to ADLs: Only stood EOB as pt bad orders for bedrest with bathroom, as well as activity ad lib in same order set.  Will leave note for MD     Extremity Assessment RUE Assessment RUE Assessment: Within Functional Limits LUE Assessment LUE Assessment: Within Functional Limits Mobility  Bed Mobility Bed Mobility: Yes Supine to Sit: 4: Min assist Transfers Transfers: Yes Sit to Stand: 4: Min assist Stand to Sit: 4: Min assist    End of Session OT - End of Session Activity Tolerance: Patient limited by fatigue Patient left: in bed;Other (comment);with  family/visitor present (sitting EOB with lunch) General Behavior During Session: [facility] for tasks performed Cognition: [facility] for tasks performed   Briggs Edelen, [place] Kung 02/23/2012, 12:51 PM

## 2012-02-23 NOTE — Progress Notes (Signed)
02-23-12 UR completed. Ronny Flurry RN BSN

## 2012-02-23 NOTE — H&P (Signed)
I interviewed and examined this patient and discussed the care plan with Dr. Madolyn Frieze and the [facility] team and agree with assessment and plan as documented in the admission note for today. The chest xray and history of smoking and 2 cancers supports the use of chest CT to evaluate the possible right lung nodule    Winifred Bodiford A. Sheffield Slider, MD Family Medicine Teaching Service Attending  02/23/2012 8:52 AM

## 2012-02-23 NOTE — Progress Notes (Signed)
Weaned O2 from 50% Venturi mask to 40%, then 35% over 2 hours with O2 sats 96 - 100% throughout.  After 1 hour, weaned 35% Venturi mask to 5L humidified O2 per nasal canula.  Patient maintaining O2 sat 94 - 96%.  Continuing to monitor closely.

## 2012-02-24 DIAGNOSIS — I369 Nonrheumatic tricuspid valve disorder, unspecified: Secondary | ICD-10-CM

## 2012-02-24 LAB — STREP PNEUMONIAE URINARY ANTIGEN: Strep Pneumo Urinary Antigen: POSITIVE — AB

## 2012-02-24 LAB — GLUCOSE, CAPILLARY
Glucose-Capillary: 108 mg/dL — ABNORMAL HIGH (ref 70–99)
Glucose-Capillary: 109 mg/dL — ABNORMAL HIGH (ref 70–99)

## 2012-02-24 MED ORDER — PNEUMOCOCCAL VAC POLYVALENT 25 MCG/0.5ML IJ INJ
0.5000 mL | INJECTION | INTRAMUSCULAR | Status: AC
  Administered 2012-02-25: 0.5 mL via INTRAMUSCULAR
  Filled 2012-02-24: qty 0.5

## 2012-02-24 MED ORDER — METFORMIN HCL 500 MG PO TABS
500.0000 mg | ORAL_TABLET | Freq: Every day | ORAL | Status: DC
  Administered 2012-02-24 – 2012-02-25 (×2): 500 mg via ORAL
  Filled 2012-02-24 (×3): qty 1

## 2012-02-24 NOTE — Progress Notes (Signed)
Attempted to wean off O2 at 3 LPM via nasal cannula. Unsuccessful at this time. O2 saturation dropped from 95-97% to 88 - 89 %. Pt back on O2 at 1LPM. Will recheck.  Ancil Linsey RN

## 2012-02-24 NOTE — Progress Notes (Signed)
  Echocardiogram 2D Echocardiogram has been performed.  Mandy Kim L 02/24/2012, 9:14 AM

## 2012-02-24 NOTE — Progress Notes (Signed)
Subjective: Patient feels well overnight. She notes coughing weakness and a right-sided headache. She denies any difficulty breathing or shortness or breath currently. She feels well considering that she is in the hospital.  Objective: Vital signs in last 24 hours: Temp:  [97.8 F (36.6 C)-98.8 F (37.1 C)] 98.8 F (37.1 C) (03/23 0500) Pulse Rate:  [58-74] 58  (03/23 0500) Resp:  [20] 20  (03/23 0500) BP: (95-112)/(48-68) 109/48 mmHg (03/23 0500) SpO2:  [96 %-100 %] 96 % (03/23 0500) Weight:  [179 lb 6.4 oz (81.375 kg)] 179 lb 6.4 oz (81.375 kg) (03/23 0500) Weight change: 4 lb 14.4 oz (2.223 kg) Last BM Date: 02/23/12  Intake/Output from previous day: 03/22 0701 - 03/23 0700 In: 720 [P.O.:720] Out: -  Intake/Output this shift:    Exam:  BP 109/48  Pulse 58  Temp(Src) 98.8 F (37.1 C) (Oral)  Resp 20  Ht 5\' 4"  (1.626 m)  Wt 179 lb 6.4 oz (81.375 kg)  BMI 30.79 kg/m2  SpO2 96% Gen: Well NAD in a hospital bed wearing nasal cannula oxygen HEENT: EOMI,  MMM Lungs:  Nl WOB, coarse breath sounds and rhonchi bilaterally without wheezing or crackles Heart: RRR no MRG Abd: NABS, NT, ND Exts: Non edematous BL  LE, warm and well perfused.    Lab Results:  Basename 02/23/12 0625 02/22/12 1737  WBC 8.7 10.2  HGB 11.4* 12.9  HCT 33.5* 37.5  PLT 182 212   BMET  Basename 02/23/12 0625 02/22/12 1737  NA 138 131*  K 3.6 3.2*  CL 107 100  CO2 22 21  GLUCOSE 108* 130*  BUN 10 13  CREATININE 0.77 0.85  CALCIUM 8.0* 8.5    Studies/Results: Dg Chest 2 View    IMPRESSION:  1.  New patchy right middle lobe airspace disease, suspicious for infection. 2.  Right infrahilar soft tissue fullness.  Cannot exclude a central mass or adenopathy.  Antibiotic therapy with short-term plain film follow-up versus further characterization with contrast enhanced chest CT recommended.    Ct Chest Wo Contrast  .  IMPRESSION:  1.  Bilateral airspace disease worst in the right middle and  both lower lobes most consistent with bronchopneumonia. Recommend follow- up films to clearing. 2.  Marked emphysema. 3.  Fatty infiltration of the liver.      Medications:  Scheduled:   . calcium citrate  200 mg of elemental calcium Oral Daily  . cholecalciferol  1,000 Units Oral Daily  . clopidogrel  75 mg Oral Q breakfast  . gemfibrozil  600 mg Oral BID AC  . heparin  5,000 Units Subcutaneous Q8H  . insulin aspart  0-15 Units Subcutaneous TID WC  . metoprolol succinate  12.5 mg Oral Daily  . moxifloxacin  400 mg Oral q1800  . omega-3 acid ethyl esters  1 g Oral Daily  . pantoprazole  40 mg Oral Q1200  . pneumococcal 23 valent vaccine  0.5 mL Intramuscular Tomorrow-1000  . potassium chloride  20 mEq Oral BID  . pyridOXINE  100 mg Oral Daily    Assessment/Plan: This is a 66 year old Caucasian female with a history of AAA s/p repair, HTN, T2DM recently diagnosed and started on metformin, HLD, significant past smoking history, and history of breast and cervical cancer presenting with dyspnea/hypoxia, cough, fever to 102 on admission.   1) Dyspnea, hypoxia to 80% on RA on admission  Very suspicious for pneumonia based on chest CT scan results. Malignancy less likely given high-quality chest CT scan.  Additionally baseline COPD is certainly a factor in dyspnea.   Patient is being treated with  Moxifloxacin, oxygen therapy, incentive spirometer.  Additionally labs noted below are pending. -Keep O2 saturations above 90%  -Follow-up blood cultures and flu PCR   -Per pneumonia protocol: HIV, sputum culture and GS, viral panel, adenovirus, Legionella Ab, Legionalla and strep urine (although patient has received dose of antibiotic)    Recent 20lb weight loss  May be due to starting medication for diabetes recently. Obviously this is concerning for malignancy however we didn't see any indication of lung cancer based on chest CT results,    History of OSA on noctural CPAP    -CPAP QHS    History of HTN: Initially hypotensive now blood pressures are improved.  On some outpatient blood pressure medications at half dose currently.  Plan to follow blood pressure and continue home medicine. Is becoming more hypertensive will increase to full dose home metoprolol.    History of AAA s/p repair  -Home medications: metoprolol 25, gemfibrozal>>>continue gemfibrozal, and blood pressure medicines as above. -Plavix (started after AAA repair)    History of T2DM  -HgbA1c  -SSI   HEME/ONC  History of breast and cervical cancer s/p mastectomy and hysterectomy  -CBC WNL. Monitor.   FEN/GI  Hyponatremia  Hypokalemia  Metabolic panel this morning is normalizing. Patient is eating and drinking. Plan to saline lock IV and provide oral potassium and check metabolic panel tomorrow morning.   -Diet: carbohydrate-modified diet  PPx  -DVT PPx: heparin SQ -SUP: home Protonix  DISPO: pending clinical improvement  CODE: full   LOS: 2 days   Mandy Kim 02/24/2012, 9:47 AM [phone]

## 2012-02-24 NOTE — Progress Notes (Signed)
Pt O2 saturation prior to ambulation at 1500 hrs was 93 - 94 % on 1 LPM O2. Pt ambulated with O2 saturation at 91-93 % on 1 LPM O2. O2 taken off at this time. Will continue to measure O2 sat .  Ancil Linsey RN

## 2012-02-24 NOTE — Progress Notes (Signed)
I have seen and examined this patient. I have discussed with Dr Denyse Amass.  I agree with their findings and plans as documented in their progress note for today.  Acute Issues  1. Pneumococcal Pneumonia - (+) Strep pneumoniae urinary antigen - Improving.  Suspect headache and throbbing in head secondary to invasive pneumococcal infection of lungs with SIRS.  - If continues to improve clinically, then anticipate discharge home tomorrow to complete a total 7 day course of effective antibiotic. 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  Pt willing to take time off from work to recover.  She will need a note for work requesting that they allow up to 2 weeks for patient full recovery.

## 2012-02-24 NOTE — Progress Notes (Signed)
Weaned O2 to 3L via [place] from 5L via [place] with humidifier in place.  Patient maintaining O2 saturation of 96 - 98%.  Continuing to closely monitor.

## 2012-02-24 NOTE — Progress Notes (Signed)
Pt refused CPAP qhs and stated that she does not like the fitment of the mask that we have available.  Pt stated she would wear nasal cannula tonight instead.

## 2012-02-24 NOTE — Progress Notes (Signed)
Pt would like to clarify if she will continue not to take her metformin. She had been off the medication x 2 days. Please address accordingly. Thanks. Ancil Linsey RN

## 2012-02-25 LAB — BASIC METABOLIC PANEL
Chloride: 102 mEq/L (ref 96–112)
GFR calc Af Amer: >90 mL/min (ref >90)
GFR calc non Af Amer: 85 mL/min — ABNORMAL LOW (ref >90)
Glucose, Bld: 114 mg/dL — ABNORMAL HIGH (ref 70–99)
Potassium: 4.2 mEq/L (ref 3.5–5.1)
Sodium: 135 mEq/L (ref 135–145)

## 2012-02-25 LAB — CBC
Hemoglobin: 12.3 g/dL (ref 12.0–15.0)
MCH: 29.1 pg (ref 26.0–34.0)
RBC: 4.22 MIL/uL (ref 3.87–5.11)

## 2012-02-25 LAB — LEGIONELLA ANTIGEN, URINE: Legionella Antigen, Urine: NEGATIVE

## 2012-02-25 LAB — GLUCOSE, CAPILLARY: Glucose-Capillary: 93 mg/dL (ref 70–99)

## 2012-02-25 MED ORDER — MOXIFLOXACIN HCL 400 MG PO TABS
400.0000 mg | ORAL_TABLET | Freq: Every day | ORAL | Status: AC
Start: 2012-02-23 — End: 2012-03-08

## 2012-02-25 NOTE — Discharge Instructions (Signed)
Pneumonia, Adult Pneumonia is an infection of the lungs. It may be caused by a germ (virus or bacteria). Some types of pneumonia can spread easily from person to person. This can happen when you cough or sneeze. HOME CARE  Only take medicine as told by your doctor.   Take your medicine (antibiotics) as told. Finish it even if you start to feel better.   Do not smoke.   You may use a vaporizer or humidifier in your room. This can help loosen thick spit (mucus).   Sleep so you are almost sitting up (semi-upright). This helps reduce coughing.   Rest.  A shot (vaccine) can help prevent pneumonia. Shots are often advised for:  People over 65 years old.   Patients on chemotherapy.   People with long-term (chronic) lung problems.   People with immune system problems.  GET HELP RIGHT AWAY IF:   You are getting worse.   You cannot control your cough, and you are losing sleep.   You cough up blood.   Your pain gets worse, even with medicine.   You have a fever.   Any of your problems are getting worse, not better.   You have shortness of breath or chest pain.  MAKE SURE YOU:   Understand these instructions.   Will watch your condition.   Will get help right away if you are not doing well or get worse.  Document Released: 05/08/2008 Document Revised: 11/09/2011 Document Reviewed: 02/10/2011 [employer] Patient Information 2012 [employer]. 

## 2012-02-25 NOTE — Progress Notes (Signed)
PT oxygen saturation at 92-93 % on 1 LPM o2. Pt O2 taken off oxygen post 30 min to 1 hour at 93%. Dr. Tomasa Blase aware. Thanks ! Ancil Linsey RN

## 2012-02-25 NOTE — Discharge Summary (Signed)
Physician Discharge Summary  Patient ID: Mandy Kim [id number] [birth year] 66 y.o.  Admit date: 02/22/2012 Discharge date: 02/25/2012  PCP: No primary provider on file.   Discharge Diagnosis: 1. Community acquire pneumonia. 2. DM 3. HTN 4. Obesity. 5 HX of AAA repair, cervical and breast CA, and Obstructive Sleep Apnea.   Discharge Medications  Mandy Kim  Home Medication Instructions [id number]   Printed on:02/25/12 1330  Medication Information                    clopidogrel (PLAVIX) 75 MG tablet Take by mouth daily.           gemfibrozil (LOPID) 600 MG tablet Take 600 mg by mouth 2 (two) times daily before a meal.           metoprolol succinate (TOPROL-XL) 25 MG 24 hr tablet Take 25 mg by mouth daily.           pantoprazole (PROTONIX) 40 MG tablet Take 40 mg by mouth daily.           metFORMIN (GLUCOPHAGE) 500 MG tablet Take 500 mg by mouth daily.            pyridOXINE (VITAMIN B-6) 100 MG tablet Take 100 mg by mouth daily.           cholecalciferol (VITAMIN D) 1000 UNITS tablet Take 1,000 Units by mouth daily.           calcium citrate-vitamin D 200-200 MG-UNIT TABS Take 1 tablet by mouth 2 (two) times daily.           fish oil-omega-3 fatty acids 1000 MG capsule Take 1-2 g by mouth 2 (two) times daily. Patient takes 1 tablet in the morning and 2 tablets at bedtime           moxifloxacin (AVELOX) 400 MG tablet Take 1 tablet (400 mg total) by mouth daily at 6 PM.              Consults:None  Physical Exam on Discharge. Gen: Well NAD in a hospital bed wearing nasal cannula oxygen  HEENT: EOMI, MMM  Lungs: Nl WOB, coarse breath sounds bilaterally and scattered rales. Heart: RRR no MRG  Abd: NABS, NT, ND  Exts: Non edematous BL LE, warm and well perfused  Labs: CBC  Lab 02/24/12 2349 02/23/12 0625 02/22/12 1737  WBC 7.7 8.7 10.2  HGB 12.3 11.4* 12.9  HCT 35.6* 33.5* 37.5  PLT 249 182 212   BMET  Lab 02/24/12 2349 02/23/12 0625  02/22/12 1737  NA 135 138 131*  K 4.2 3.6 3.2*  CL 102 107 100  CO2 23 22 21   BUN 12 10 13   CREATININE 0.77 0.77 0.85  CALCIUM 9.6 8.0* 8.5  PROT -- -- --  BILITOT -- -- --  ALKPHOS -- -- --  ALT -- -- --  AST -- -- --  GLUCOSE 114* 108* 130*   Results for orders placed during the hospital encounter of 02/22/12 (from the past 72 hour(s))  INFLUENZA PANEL BY PCR     Status: Normal   Collection Time   02/23/12 12:54 AM      Component Value Range Comment   Influenza A By PCR NEGATIVE  NEGATIVE     Influenza B By PCR NEGATIVE  NEGATIVE     H1N1 flu by pcr NOT DETECTED  NOT DETECTED    HIV ANTIBODY (ROUTINE TESTING)     Status: Normal   Collection Time   02/23/12  6:25 AM  STREP PNEUMONIAE URINARY ANTIGEN     Status: Abnormal   Collection Time   02/24/12  4:33 AM      Component Value Range Comment   Strep Pneumo Urinary Antigen POSITIVE (*) NEGATIVE      Procedures/Imaging:  Dg Chest 2 View 02/22/2012   IMPRESSION:  1.  New patchy right middle lobe airspace disease, suspicious for infection. 2.  Right infrahilar soft tissue fullness.  Cannot exclude a central mass or adenopathy.  Antibiotic therapy with short-term plain film follow-up versus further characterization with contrast enhanced chest CT recommended.  This differs from the preliminary report by the clinical service, which described only right lower lobe pneumonia. These results will be called to the ordering clinician or representative by the Radiologist Assistant, and communication documented in the PACS Dashboard.  Original Report Authenticated By: Mandy Kim, M.D.   Ct Chest Wo Contrast 02/23/2012   IMPRESSION:  1.  Bilateral airspace disease worst in the right middle and both lower lobes most consistent with bronchopneumonia. Recommend follow- up films to clearing. 2.  Marked emphysema. 3.  Fatty infiltration of the liver.  Original Report Authenticated By: Mandy Kim. Mandy Kim, M.D.     Brief Hospital Course: This is  a 66 year old Caucasian female with a history of AAA s/p repair, HTN, T2DM recently diagnosed and started on metformin, HLD, significant past smoking history, and history of breast and cervical cancer presenting with dyspnea/hypoxia, cough, fever to 102 on admission.  1. Dyspnea, hypoxia to 80% on RA on admission  Very suspicious for pneumonia based on chest CT scan results. Malignancy less likely given high-quality chest CT scan. Additionally baseline COPD is certainly a factor in dyspnea.  Patient was treated with Moxifloxacin, oxygen therapy, incentive spirometer. Strep urine was positive and abx selection covers it. Will continue  Moxifloxacin for 14 days course. HIV negative, Influenza negative. Additionally labs noted below are pending.  -Per pneumonia protocol: , sputum culture and GS, viral panel, adenovirus, Legionella Ab. 2. Recent 20lb weight loss  May be due to starting medication for diabetes recently. Obviously this is concerning for malignancy however we didn't see any indication of lung cancer based on chest CT results. Recommended F/u films to clearing.  History of OSA on noctural CPAP  -CPAP QHS  History of HTN: Initially hypotensive now blood pressures are improved. On some outpatient blood pressure medications at half dose currently and BP controlled. We recommend f/u as outpatient. History of AAA s/p repair  -Plavix (started after AAA repair)  History of T2DM  -HgbA1c pending. Pt on SSI and CBG's were low 100's   Patient condition at time of discharge/disposition:  Patient is discharge home  on stable medical condition.   Follow up issues: 1.improvement of PNA 2. F/u pending labs.  Discharge follow up:  Discharge Orders    Future Appointments: Provider: Department: Dept Phone: Center:   05/27/2012 11:45 AM Mandy Albee, MD [facility] [phone] [facility]        D. Piloto Rolene Arbour, MD  Mandy Kim [facility] 02/25/2012

## 2012-02-26 NOTE — Discharge Summary (Signed)
I have seen and examined this patient. I have discussed with Dr Piloto.  I agree with their findings and plans as documented in their discharge note.  

## 2012-02-29 LAB — CULTURE, BLOOD (ROUTINE X 2): Culture  Setup Time: 201303220135

## 2012-03-07 ENCOUNTER — Ambulatory Visit (INDEPENDENT_AMBULATORY_CARE_PROVIDER_SITE_OTHER): Payer: Managed Care, Other (non HMO) | Admitting: Internal Medicine

## 2012-03-07 VITALS — BP 111/61 | HR 57 | Temp 98.1°F | Resp 16 | Ht 66.0 in | Wt 172.2 lb

## 2012-03-07 DIAGNOSIS — J9801 Acute bronchospasm: Secondary | ICD-10-CM

## 2012-03-07 DIAGNOSIS — R5381 Other malaise: Secondary | ICD-10-CM

## 2012-03-07 DIAGNOSIS — J329 Chronic sinusitis, unspecified: Secondary | ICD-10-CM

## 2012-03-07 DIAGNOSIS — R5383 Other fatigue: Secondary | ICD-10-CM

## 2012-03-07 DIAGNOSIS — J159 Unspecified bacterial pneumonia: Secondary | ICD-10-CM

## 2012-03-07 MED ORDER — MOXIFLOXACIN HCL 400 MG PO TABS: 400.0000 mg | ORAL_TABLET | Freq: Every day | ORAL | Status: AC

## 2012-03-07 MED ORDER — ALBUTEROL SULFATE (2.5 MG/3ML) 0.083% IN NEBU
2.5000 mg | INHALATION_SOLUTION | Freq: Once | RESPIRATORY_TRACT | Status: AC
  Administered 2012-03-07: 2.5 mg via RESPIRATORY_TRACT

## 2012-03-07 MED ORDER — METHYLPREDNISOLONE ACETATE 80 MG/ML IJ SUSP
80.0000 mg | Freq: Once | INTRAMUSCULAR | Status: AC
  Administered 2012-03-07: 80 mg via INTRAMUSCULAR

## 2012-03-07 MED ORDER — CEFTRIAXONE SODIUM 1 G IJ SOLR: 1.0000 g | INTRAMUSCULAR | Status: DC

## 2012-03-07 MED ORDER — IPRATROPIUM BROMIDE 0.02 % IN SOLN
0.5000 mg | Freq: Once | RESPIRATORY_TRACT | Status: AC
  Administered 2012-03-07: 0.5 mg via RESPIRATORY_TRACT

## 2012-03-07 MED ORDER — CEFTRIAXONE SODIUM 1 G IJ SOLR
1.0000 g | Freq: Once | INTRAMUSCULAR | Status: AC
  Administered 2012-03-07: 1 g via INTRAMUSCULAR

## 2012-03-07 NOTE — Progress Notes (Signed)
  Subjective:    Patient ID: Mandy Kim, female    DOB: Feb 15, 1946, 66 y.o.   MRN: [id number]  HPI Has been in hospital for pneumonia, Has improved a lot. W/up and tx reviewed. Is on her last 2 avelox. SOB  Resolved, worst sx is sinus, nasal, ear head congestion. She quit smokes 2000. Has COPD , oximetry is 96% now. No chest pain. Fever in evening   Review of Systems     Objective:   Physical Exam  Constitutional: She is oriented to person, place, and time. She appears well-nourished. She is active.  Non-toxic appearance. She has a sickly appearance. No distress.  HENT:  Right Ear: Tympanic membrane is retracted. Tympanic membrane mobility is abnormal. Decreased hearing is noted.  Left Ear: Tympanic membrane is scarred. Tympanic membrane mobility is abnormal. Decreased hearing is noted.  Nose: Mucosal edema, rhinorrhea and sinus tenderness present. Right sinus exhibits frontal sinus tenderness. Left sinus exhibits frontal sinus tenderness.  Mouth/Throat: Oropharynx is clear and moist and mucous membranes are normal.  Cardiovascular: Normal rate and regular rhythm.   Pulmonary/Chest: Effort normal. No respiratory distress. She has wheezes. She has rhonchi. She has rales.  Neurological: She is alert and oriented to person, place, and time. Coordination normal.  Skin: Skin is warm and dry.  Psychiatric: She has a normal mood and affect.    Neb. With albut and atovent     Assessment & Plan:  Rocephin 1g IM Depomedrol 80mg  IM Rest hydrate One more week of avelox

## 2012-03-07 NOTE — Patient Instructions (Signed)

## 2012-03-08 ENCOUNTER — Telehealth: Payer: Self-pay

## 2012-03-08 NOTE — Telephone Encounter (Signed)
.  [facility] The patient called to state that Dr. Perrin Maltese prescribed her a nasal inhaler when she was seen yesterday (03/07/12), but when she went to the pharmacy she stated the nasal inhaler wasn't there.  Please call patient at [phone] regarding this medication.

## 2012-03-13 ENCOUNTER — Ambulatory Visit (INDEPENDENT_AMBULATORY_CARE_PROVIDER_SITE_OTHER): Payer: Managed Care, Other (non HMO) | Admitting: Family Medicine

## 2012-03-13 DIAGNOSIS — J301 Allergic rhinitis due to pollen: Secondary | ICD-10-CM

## 2012-03-13 DIAGNOSIS — J189 Pneumonia, unspecified organism: Secondary | ICD-10-CM

## 2012-03-13 MED ORDER — FLUTICASONE PROPIONATE 50 MCG/ACT NA SUSP: 2.0000 | Freq: Every day | NASAL | Status: DC

## 2012-03-13 NOTE — Progress Notes (Signed)
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  Finishing her avelox.   C/o allergies with nasal congestion  O: NAD Heart: reg, no murmur with rate about 70 Chest:  Few bilateral exp wheezes HEENT:  Unremarkable except swollen nasal passages Neck:  Supple, no adenopathy  A:  Allergic rhinitis,  Resolving pneumonia  P:  flonase daily

## 2012-05-06 ENCOUNTER — Encounter: Payer: Self-pay | Admitting: Internal Medicine

## 2012-05-06 ENCOUNTER — Ambulatory Visit (INDEPENDENT_AMBULATORY_CARE_PROVIDER_SITE_OTHER): Payer: Managed Care, Other (non HMO) | Admitting: Internal Medicine

## 2012-05-06 VITALS — BP 115/55 | HR 49 | Temp 97.5°F | Resp 16 | Ht 65.5 in | Wt 162.4 lb

## 2012-05-06 DIAGNOSIS — R5383 Other fatigue: Secondary | ICD-10-CM

## 2012-05-06 DIAGNOSIS — Z7189 Other specified counseling: Secondary | ICD-10-CM

## 2012-05-06 DIAGNOSIS — E782 Mixed hyperlipidemia: Secondary | ICD-10-CM

## 2012-05-06 DIAGNOSIS — Z79899 Other long term (current) drug therapy: Secondary | ICD-10-CM

## 2012-05-06 DIAGNOSIS — E785 Hyperlipidemia, unspecified: Secondary | ICD-10-CM

## 2012-05-06 LAB — POCT URINALYSIS DIPSTICK
Bilirubin, UA: NEGATIVE
Blood, UA: NEGATIVE
Glucose, UA: NEGATIVE
Nitrite, UA: NEGATIVE
Spec Grav, UA: 1.025
Urobilinogen, UA: 0.2

## 2012-05-06 LAB — POCT UA - MICROSCOPIC ONLY
Bacteria, U Microscopic: NEGATIVE
Crystals, Ur, HPF, POC: NEGATIVE

## 2012-05-06 NOTE — Progress Notes (Signed)
Subjective:    Patient ID: Mandy Kim, female    DOB: 1946/02/04, 66 y.o.   MRN: [id number]  HPI High cholesterol with allergy to all statins, hx of AAA repair on plavix. Feels good and is working. Had cervical and breast cancer. NIDDM controlled Review of Systems Needs cpe soon    Objective:   Physical Exam  Constitutional: She is oriented to person, place, and time. She appears well-developed and well-nourished.  HENT:  Right Ear: External ear normal.  Left Ear: External ear normal.  Nose: Nose normal.  Eyes: EOM are normal.  Neck: Normal range of motion. Neck supple.  Cardiovascular: Regular rhythm, normal heart sounds and normal pulses.  Bradycardia present.  Exam reveals no decreased pulses.        No bruit heard  Pulmonary/Chest: Effort normal and breath sounds normal.  Abdominal: Soft. Bowel sounds are normal. She exhibits no distension and no mass. There is no tenderness.  Musculoskeletal: Normal range of motion.  Neurological: She is alert and oriented to person, place, and time. Coordination normal.  Skin: Skin is warm and dry.  Psychiatric: She has a normal mood and affect. Her behavior is normal. Judgment and thought content normal.    Results for orders placed during the hospital encounter of 02/22/12  CBC      Component Value Range   WBC 10.2  4.0 - 10.5 (K/uL)   RBC 4.40  3.87 - 5.11 (MIL/uL)   Hemoglobin 12.9  12.0 - 15.0 (g/dL)   HCT 40.9  81.1 - 91.4 (%)   MCV 85.2  78.0 - 100.0 (fL)   MCH 29.3  26.0 - 34.0 (pg)   MCHC 34.4  30.0 - 36.0 (g/dL)   RDW 78.2  95.6 - 21.3 (%)   Platelets 212  150 - 400 (K/uL)  BASIC METABOLIC PANEL      Component Value Range   Sodium 131 (*) 135 - 145 (mEq/L)   Potassium 3.2 (*) 3.5 - 5.1 (mEq/L)   Chloride 100  96 - 112 (mEq/L)   CO2 21  19 - 32 (mEq/L)   Glucose, Bld 130 (*) 70 - 99 (mg/dL)   BUN 13  6 - 23 (mg/dL)   Creatinine, Ser 0.86  0.50 - 1.10 (mg/dL)   Calcium 8.5  8.4 - 57.8 (mg/dL)   GFR calc non Af  Amer 69 (*) >90 (mL/min)   GFR calc Af Amer 80 (*) >90 (mL/min)  CULTURE, BLOOD (ROUTINE X 2)      Component Value Range   Specimen Description BLOOD LEFT THUMB     Special Requests BOTTLES DRAWN AEROBIC ONLY 3CC     Culture  Setup Time [id number]     Culture NO GROWTH 5 DAYS     Report Status 02/29/2012 FINAL    CULTURE, BLOOD (ROUTINE X 2)      Component Value Range   Specimen Description BLOOD LEFT HAND     Special Requests BOTTLES DRAWN AEROBIC ONLY 3CC     Culture  Setup Time [id number]     Culture NO GROWTH 5 DAYS     Report Status 02/29/2012 FINAL    INFLUENZA PANEL BY PCR      Component Value Range   Influenza A By PCR NEGATIVE  NEGATIVE    Influenza B By PCR NEGATIVE  NEGATIVE    H1N1 flu by pcr NOT DETECTED  NOT DETECTED   GLUCOSE, CAPILLARY      Component Value Range   Glucose-Capillary 107 (*)  70 - 99 (mg/dL)  LEGIONELLA ANTIGEN, URINE      Component Value Range   Specimen Description URINE, RANDOM     Special Requests NONE     Legionella Antigen, Urine Negative for Legionella pneumophilia serogroup 1     Report Status 02/25/2012 FINAL    STREP PNEUMONIAE URINARY ANTIGEN      Component Value Range   Strep Pneumo Urinary Antigen POSITIVE (*) NEGATIVE   ADENOVIRUS ANTIBODIES      Component Value Range   Adenovirus Antibody (NOTE)    LEGIONELLA PNEUMOPHILA ANTIBODIES      Component Value Range   Legionella pneumo, IgG Type 1-6 (NOTE)  NONE DET   HEMOGLOBIN A1C      Component Value Range   Hemoglobin A1C 6.6     Mean Plasma Glucose 143    BASIC METABOLIC PANEL      Component Value Range   Sodium 138  135 - 145 (mEq/L)   Potassium 3.6  3.5 - 5.1 (mEq/L)   Chloride 107  96 - 112 (mEq/L)   CO2 22  19 - 32 (mEq/L)   Glucose, Bld 108 (*) 70 - 99 (mg/dL)   BUN 10  6 - 23 (mg/dL)   Creatinine, Ser 1.61  0.50 - 1.10 (mg/dL)   Calcium 8.0 (*) 8.4 - 10.5 (mg/dL)   GFR calc non Af Amer 85 (*) >90 (mL/min)   GFR calc Af Amer >90  >90 (mL/min)  CBC       Component Value Range   WBC 8.7  4.0 - 10.5 (K/uL)   RBC 3.91  3.87 - 5.11 (MIL/uL)   Hemoglobin 11.4 (*) 12.0 - 15.0 (g/dL)   HCT 09.6 (*) 04.5 - 46.0 (%)   MCV 85.7  78.0 - 100.0 (fL)   MCH 29.2  26.0 - 34.0 (pg)   MCHC 34.0  30.0 - 36.0 (g/dL)   RDW 40.9  81.1 - 91.4 (%)   Platelets 182  150 - 400 (K/uL)  HIV ANTIBODY (ROUTINE TESTING)      Component Value Range   HIV NON REACTIVE  NON REACTIVE   GLUCOSE, CAPILLARY      Component Value Range   Glucose-Capillary 114 (*) 70 - 99 (mg/dL)   Comment 1 Notify RN    GLUCOSE, CAPILLARY      Component Value Range   Glucose-Capillary 109 (*) 70 - 99 (mg/dL)   Comment 1 Notify RN    GLUCOSE, CAPILLARY      Component Value Range   Glucose-Capillary 104 (*) 70 - 99 (mg/dL)   Comment 1 Notify RN    GLUCOSE, CAPILLARY      Component Value Range   Glucose-Capillary 104 (*) 70 - 99 (mg/dL)   Comment 1 Notify RN    GLUCOSE, CAPILLARY      Component Value Range   Glucose-Capillary 113 (*) 70 - 99 (mg/dL)  BASIC METABOLIC PANEL      Component Value Range   Sodium 135  135 - 145 (mEq/L)   Potassium 4.2  3.5 - 5.1 (mEq/L)   Chloride 102  96 - 112 (mEq/L)   CO2 23  19 - 32 (mEq/L)   Glucose, Bld 114 (*) 70 - 99 (mg/dL)   BUN 12  6 - 23 (mg/dL)   Creatinine, Ser 7.82  0.50 - 1.10 (mg/dL)   Calcium 9.6  8.4 - 95.6 (mg/dL)   GFR calc non Af Amer 85 (*) >90 (mL/min)   GFR calc Af Amer >90  >  90 (mL/min)  CBC      Component Value Range   WBC 7.7  4.0 - 10.5 (K/uL)   RBC 4.22  3.87 - 5.11 (MIL/uL)   Hemoglobin 12.3  12.0 - 15.0 (g/dL)   HCT 78.2 (*) 95.6 - 46.0 (%)   MCV 84.4  78.0 - 100.0 (fL)   MCH 29.1  26.0 - 34.0 (pg)   MCHC 34.6  30.0 - 36.0 (g/dL)   RDW 21.3  08.6 - 57.8 (%)   Platelets 249  150 - 400 (K/uL)  GLUCOSE, CAPILLARY      Component Value Range   Glucose-Capillary 108 (*) 70 - 99 (mg/dL)  GLUCOSE, CAPILLARY      Component Value Range   Glucose-Capillary 109 (*) 70 - 99 (mg/dL)  GLUCOSE, CAPILLARY      Component  Value Range   Glucose-Capillary 87  70 - 99 (mg/dL)  GLUCOSE, CAPILLARY      Component Value Range   Glucose-Capillary 93  70 - 99 (mg/dL)        Assessment & Plan:  stable

## 2012-05-07 LAB — CBC WITH DIFFERENTIAL/PLATELET
HCT: 37.5 % (ref 36.0–46.0)
Hemoglobin: 12.6 g/dL (ref 12.0–15.0)
Lymphocytes Relative: 41 % (ref 12–46)
Lymphs Abs: 3.3 10*3/uL (ref 0.7–4.0)
MCHC: 33.6 g/dL (ref 30.0–36.0)
Monocytes Absolute: 0.6 10*3/uL (ref 0.1–1.0)
Monocytes Relative: 8 % (ref 3–12)
Neutro Abs: 3.9 10*3/uL (ref 1.7–7.7)
Neutrophils Relative %: 49 % (ref 43–77)
RBC: 4.34 MIL/uL (ref 3.87–5.11)
WBC: 8 10*3/uL (ref 4.0–10.5)

## 2012-05-07 LAB — LIPID PANEL
HDL: 29 mg/dL — ABNORMAL LOW (ref >39)
LDL Cholesterol: 138 mg/dL — ABNORMAL HIGH (ref 0–99)
Triglycerides: 194 mg/dL — ABNORMAL HIGH (ref <150)

## 2012-05-07 LAB — COMPREHENSIVE METABOLIC PANEL
Albumin: 4.3 g/dL (ref 3.5–5.2)
BUN: 18 mg/dL (ref 6–23)
CO2: 21 mEq/L (ref 19–32)
Calcium: 9.3 mg/dL (ref 8.4–10.5)
Chloride: 110 mEq/L (ref 96–112)
Glucose, Bld: 90 mg/dL (ref 70–99)
Potassium: 4 mEq/L (ref 3.5–5.3)
Sodium: 144 mEq/L (ref 135–145)
Total Protein: 6.9 g/dL (ref 6.0–8.3)

## 2012-05-20 ENCOUNTER — Encounter: Payer: Managed Care, Other (non HMO) | Admitting: Internal Medicine

## 2012-05-27 ENCOUNTER — Ambulatory Visit: Payer: Managed Care, Other (non HMO) | Admitting: Internal Medicine

## 2012-06-05 ENCOUNTER — Other Ambulatory Visit: Payer: Self-pay

## 2012-06-05 MED ORDER — METFORMIN HCL 500 MG PO TABS: 500.0000 mg | ORAL_TABLET | Freq: Every day | ORAL | Status: DC

## 2012-06-07 ENCOUNTER — Other Ambulatory Visit: Payer: Self-pay | Admitting: Family Medicine

## 2012-06-20 ENCOUNTER — Other Ambulatory Visit: Payer: Self-pay | Admitting: Family Medicine

## 2012-07-14 ENCOUNTER — Ambulatory Visit (INDEPENDENT_AMBULATORY_CARE_PROVIDER_SITE_OTHER): Payer: Managed Care, Other (non HMO) | Admitting: Family Medicine

## 2012-07-14 VITALS — BP 105/50 | HR 54 | Temp 98.0°F | Resp 16 | Ht 64.25 in | Wt 162.6 lb

## 2012-07-14 DIAGNOSIS — N390 Urinary tract infection, site not specified: Secondary | ICD-10-CM

## 2012-07-14 DIAGNOSIS — R300 Dysuria: Secondary | ICD-10-CM

## 2012-07-14 LAB — POCT URINALYSIS DIPSTICK
Bilirubin, UA: NEGATIVE
Glucose, UA: NEGATIVE
Ketones, UA: NEGATIVE
Nitrite, UA: NEGATIVE
Protein, UA: 30
Spec Grav, UA: 1.02
Urobilinogen, UA: 0.2
pH, UA: 6.5

## 2012-07-14 LAB — POCT UA - MICROSCOPIC ONLY
Casts, Ur, LPF, POC: NEGATIVE
Crystals, Ur, HPF, POC: NEGATIVE
Epithelial cells, urine per micros: NEGATIVE
Mucus, UA: NEGATIVE
Yeast, UA: NEGATIVE

## 2012-07-14 MED ORDER — CIPROFLOXACIN HCL 500 MG PO TABS: 500.0000 mg | ORAL_TABLET | Freq: Two times a day (BID) | ORAL | Status: AC

## 2012-07-14 NOTE — Progress Notes (Signed)
Results for orders placed in visit on 05/06/12  CBC WITH DIFFERENTIAL      Component Value Range   WBC 8.0  4.0 - 10.5 K/uL   RBC 4.34  3.87 - 5.11 MIL/uL   Hemoglobin 12.6  12.0 - 15.0 g/dL   HCT 08.6  57.8 - 46.9 %   MCV 86.4  78.0 - 100.0 fL   MCH 29.0  26.0 - 34.0 pg   MCHC 33.6  30.0 - 36.0 g/dL   RDW 62.9 (*) 52.8 - 41.3 %   Platelets 289  150 - 400 K/uL   Neutrophils Relative 49  43 - 77 %   Neutro Abs 3.9  1.7 - 7.7 K/uL   Lymphocytes Relative 41  12 - 46 %   Lymphs Abs 3.3  0.7 - 4.0 K/uL   Monocytes Relative 8  3 - 12 %   Monocytes Absolute 0.6  0.1 - 1.0 K/uL   Eosinophils Relative 2  0 - 5 %   Eosinophils Absolute 0.2  0.0 - 0.7 K/uL   Basophils Relative 0  0 - 1 %   Basophils Absolute 0.0  0.0 - 0.1 K/uL   Smear Review Criteria for review not met    COMPREHENSIVE METABOLIC PANEL      Component Value Range   Sodium 144  135 - 145 mEq/L   Potassium 4.0  3.5 - 5.3 mEq/L   Chloride 110  96 - 112 mEq/L   CO2 21  19 - 32 mEq/L   Glucose, Bld 90  70 - 99 mg/dL   BUN 18  6 - 23 mg/dL   Creat 2.44  0.10 - 2.72 mg/dL   Total Bilirubin 0.5  0.3 - 1.2 mg/dL   Alkaline Phosphatase 38 (*) 39 - 117 U/L   AST 22  0 - 37 U/L   ALT 25  0 - 35 U/L   Total Protein 6.9  6.0 - 8.3 g/dL   Albumin 4.3  3.5 - 5.2 g/dL   Calcium 9.3  8.4 - 53.6 mg/dL  LIPID PANEL      Component Value Range   Cholesterol 206 (*) 0 - 200 mg/dL   Triglycerides 644 (*) <150 mg/dL   HDL 29 (*) >03 mg/dL   Total CHOL/HDL Ratio 7.1     VLDL 39  0 - 40 mg/dL   LDL Cholesterol 474 (*) 0 - 99 mg/dL  TSH      Component Value Range   TSH 3.014  0.350 - 4.500 uIU/mL  POCT URINALYSIS DIPSTICK      Component Value Range   Color, UA yellow     Clarity, UA clear     Glucose, UA neg     Bilirubin, UA neg     Ketones, UA trace     Spec Grav, UA 1.025     Blood, UA neg     pH, UA 7.0     Protein, UA neg     Urobilinogen, UA 0.2     Nitrite, UA neg     Leukocytes, UA Trace    POCT UA - MICROSCOPIC  ONLY      Component Value Range   WBC, Ur, HPF, POC 0-4     RBC, urine, microscopic 0-1     Bacteria, U Microscopic neg     Mucus, UA trace     Epithelial cells, urine per micros 0-4     Crystals, Ur, HPF, POC neg  Casts, Ur, LPF, POC neg     Yeast, UA neg     66 yo woman with one week of flank pain, malodorous urine, and question of hematuria.  Also, she has dysuria, worse at the end of passage.  No fever, chills, nausea, or vomiting.  Last UTI: over 7 months ago  Objective:  NAD No cvat Results for orders placed in visit on 07/14/12  POCT URINALYSIS DIPSTICK      Component Value Range   Color, UA yellow     Clarity, UA cloudy     Glucose, UA neg     Bilirubin, UA neg     Ketones, UA neg     Spec Grav, UA 1.020     Blood, UA small     pH, UA 6.5     Protein, UA 30     Urobilinogen, UA 0.2     Nitrite, UA neg     Leukocytes, UA large (3+)     Assessment:  Uncomplicated UTI 1. Dysuria  POCT urinalysis dipstick, POCT UA - Microscopic Only, Urine culture, ciprofloxacin (CIPRO) 500 MG tablet

## 2012-07-17 LAB — URINE CULTURE: Colony Count: >=100000

## 2012-08-13 ENCOUNTER — Other Ambulatory Visit: Payer: Self-pay | Admitting: Family Medicine

## 2012-08-17 ENCOUNTER — Ambulatory Visit (INDEPENDENT_AMBULATORY_CARE_PROVIDER_SITE_OTHER): Payer: Managed Care, Other (non HMO) | Admitting: Emergency Medicine

## 2012-08-17 VITALS — BP 126/65 | HR 84 | Temp 97.7°F | Resp 18 | Ht 64.75 in | Wt 159.0 lb

## 2012-08-17 DIAGNOSIS — N3000 Acute cystitis without hematuria: Secondary | ICD-10-CM

## 2012-08-17 DIAGNOSIS — R300 Dysuria: Secondary | ICD-10-CM

## 2012-08-17 DIAGNOSIS — R319 Hematuria, unspecified: Secondary | ICD-10-CM

## 2012-08-17 LAB — POCT UA - MICROSCOPIC ONLY: Casts, Ur, LPF, POC: NEGATIVE

## 2012-08-17 LAB — POCT URINALYSIS DIPSTICK
Bilirubin, UA: NEGATIVE
Glucose, UA: NEGATIVE
Ketones, UA: NEGATIVE
Nitrite, UA: NEGATIVE
pH, UA: 5.5

## 2012-08-17 MED ORDER — PHENAZOPYRIDINE HCL 200 MG PO TABS: 200.0000 mg | ORAL_TABLET | Freq: Three times a day (TID) | ORAL | Status: AC | PRN

## 2012-08-17 MED ORDER — SULFAMETHOXAZOLE-TRIMETHOPRIM 800-160 MG PO TABS: 1.0000 | ORAL_TABLET | Freq: Two times a day (BID) | ORAL | Status: AC

## 2012-08-17 NOTE — Progress Notes (Signed)
Date:  08/17/2012   Name:  Mandy Kim   DOB:  Oct 01, 1946   MRN:  [id number] Gender: female Age: 67 y.o.  PCP:  No primary provider on file.    Chief Complaint: Urinary Tract Infection   History of Present Illness:  Mandy Kim is a 67 y.o. pleasant patient who presents with the following:  Treated a month ago for E coli in urine with 5 days antibiotics.  Now has recurrent symptoms.  Dysuria, frequency and urgency.  No fever or chills.  No nausea or vomiting.  Noticed blood in urine and gravity incontinence.  Denies other complaints.  Has found it necessary to wear an adult diaper  Patient Active Problem List  Diagnosis  . Other and unspecified hyperlipidemia  . AAA (abdominal aortic aneurysm)  . Fatty liver  . Breast cancer  . Cervical cancer  . Diabetes mellitus  . Hyperlipidemia    Past Medical History  Diagnosis Date  . Chronic kidney disease   . Hypertension   . Hyperlipidemia   . Diabetes mellitus   . Cancer     Past Surgical History  Procedure Date  . Abdominal aortic aneurysm repair   . Mastectomy   . Tonsillectomy   . Bladder tacking     History  Substance Use Topics  . Smoking status: Former Games developer  . Smokeless tobacco: Not on file  . Alcohol Use: Not on file    No family history on file.  Allergies  Allergen Reactions  . Aspirin     Per pt any pain medications  . Crestor (Rosuvastatin Calcium)   . Lipitor (Atorvastatin Calcium)   . Pravastatin   . Simvastatin     Medication list has been reviewed and updated.  Outpatient Prescriptions Prior to Visit  Medication Sig Dispense Refill  . calcium citrate-vitamin D 200-200 MG-UNIT TABS Take 1 tablet by mouth 2 (two) times daily.      . cholecalciferol (VITAMIN D) 1000 UNITS tablet Take 1,000 Units by mouth daily.      . clopidogrel (PLAVIX) 75 MG tablet Take by mouth daily.      . fish oil-omega-3 fatty acids 1000 MG capsule Take 1-2 g by mouth 2 (two) times daily. Patient takes 1  tablet in the morning and 2 tablets at bedtime      . fluticasone (FLONASE) 50 MCG/ACT nasal spray Place 2 sprays into the nose daily.  1 g  5  . gemfibrozil (LOPID) 600 MG tablet TAKE 1 TABLET BY MOUTH TWICE A DAY BEFORE MEALS  180 tablet  0  . KLOR-CON M20 20 MEQ tablet TAKE 1 TABLET BY MOUTH EVERY DAY  90 tablet  1  . metFORMIN (GLUCOPHAGE) 500 MG tablet Take 1 tablet (500 mg total) by mouth daily.  90 tablet  0  . metoprolol succinate (TOPROL-XL) 25 MG 24 hr tablet EVERY DAY  90 tablet  0  . pantoprazole (PROTONIX) 40 MG tablet TAKE 1 TABLET BY MOUTH EVERY DAY ON AN EMPTY STOMACH  90 tablet  0  . pyridOXINE (VITAMIN B-6) 100 MG tablet Take 100 mg by mouth daily.      . solifenacin (VESICARE) 5 MG tablet Take 5 mg by mouth daily.        Review of Systems:  As per HPI, otherwise negative.    Physical Examination: Filed Vitals:   08/17/12 1135  BP: 126/65  Pulse: 84  Temp: 97.7 F (36.5 C)  Resp: 18   Filed Vitals:  08/17/12 1135  Height: 5' 4.75" (1.645 m)  Weight: 159 lb (72.122 kg)   Body mass index is 26.66 kg/(m^2). Ideal Body Weight: Weight in (lb) to have BMI = 25: 148.8    GEN: WDWN, NAD, Non-toxic, Alert & Oriented x 3 HEENT: Atraumatic, Normocephalic.  Ears and Nose: No external deformity. EXTR: No clubbing/cyanosis/edema NEURO: Normal gait.  PSYCH: Normally interactive. Conversant. Not depressed or anxious appearing.  Calm demeanor.  ABDOMEN: benign soft  No cva tenderness  Assessment and Plan: Cystitis Septra Pyridium Follow up as needed  Carmelina Dane, MD   Results for orders placed in visit on 08/17/12  POCT URINALYSIS DIPSTICK      Component Value Range   Color, UA yellow     Clarity, UA cloudy     Glucose, UA neg     Bilirubin, UA neg     Ketones, UA neg     Spec Grav, UA 1.020     Blood, UA moderate     pH, UA 5.5     Protein, UA 30     Urobilinogen, UA 0.2     Nitrite, UA neg     Leukocytes, UA small (1+)    POCT UA -  MICROSCOPIC ONLY      Component Value Range   WBC, Ur, HPF, POC TNTC     RBC, urine, microscopic 10-15     Bacteria, U Microscopic 1+     Mucus, UA trace     Epithelial cells, urine per micros 2-4     Crystals, Ur, HPF, POC neg     Casts, Ur, LPF, POC neg     Yeast, UA neg     I have reviewed and agree with documentation. Robert P. Merla Riches, M.D.

## 2012-09-02 ENCOUNTER — Encounter: Payer: Self-pay | Admitting: Internal Medicine

## 2012-09-02 ENCOUNTER — Ambulatory Visit (INDEPENDENT_AMBULATORY_CARE_PROVIDER_SITE_OTHER): Payer: Managed Care, Other (non HMO) | Admitting: Internal Medicine

## 2012-09-02 VITALS — BP 131/64 | HR 55 | Temp 97.8°F | Resp 16 | Ht 65.0 in | Wt 163.0 lb

## 2012-09-02 DIAGNOSIS — Z5181 Encounter for therapeutic drug level monitoring: Secondary | ICD-10-CM

## 2012-09-02 DIAGNOSIS — N390 Urinary tract infection, site not specified: Secondary | ICD-10-CM

## 2012-09-02 DIAGNOSIS — E785 Hyperlipidemia, unspecified: Secondary | ICD-10-CM

## 2012-09-02 DIAGNOSIS — I10 Essential (primary) hypertension: Secondary | ICD-10-CM

## 2012-09-02 DIAGNOSIS — E119 Type 2 diabetes mellitus without complications: Secondary | ICD-10-CM

## 2012-09-02 DIAGNOSIS — G4730 Sleep apnea, unspecified: Secondary | ICD-10-CM

## 2012-09-02 LAB — COMPREHENSIVE METABOLIC PANEL
Albumin: 5 g/dL (ref 3.5–5.2)
CO2: 28 mEq/L (ref 19–32)
Calcium: 9.9 mg/dL (ref 8.4–10.5)
Glucose, Bld: 88 mg/dL (ref 70–99)
Potassium: 4.5 mEq/L (ref 3.5–5.3)
Sodium: 139 mEq/L (ref 135–145)
Total Protein: 7.4 g/dL (ref 6.0–8.3)

## 2012-09-02 LAB — POCT URINALYSIS DIPSTICK
Bilirubin, UA: NEGATIVE
Blood, UA: NEGATIVE
Glucose, UA: NEGATIVE
Ketones, UA: NEGATIVE
Leukocytes, UA: NEGATIVE
Nitrite, UA: NEGATIVE

## 2012-09-02 LAB — POCT GLYCOSYLATED HEMOGLOBIN (HGB A1C): Hemoglobin A1C: 5.6

## 2012-09-02 LAB — POCT UA - MICROSCOPIC ONLY
Mucus, UA: NEGATIVE
WBC, Ur, HPF, POC: NEGATIVE
Yeast, UA: NEGATIVE

## 2012-09-02 LAB — CBC
Hemoglobin: 13.5 g/dL (ref 12.0–15.0)
MCH: 29.5 pg (ref 26.0–34.0)
RBC: 4.58 MIL/uL (ref 3.87–5.11)

## 2012-09-02 NOTE — Progress Notes (Signed)
  Subjective:    Patient ID: Mandy Kim, female    DOB: 23-Dec-1945, 66 y.o.   MRN: [id number]  HPI Doing well in all areas NIDDM controled HTN controlled  Lipid disorder fair control, does not tolerate statins See scanned hx Scheduled for cataract surgery  Review of Systems See scanned ros    Objective:   Physical Exam  Constitutional: She is oriented to person, place, and time. She appears well-developed and well-nourished.  HENT:  Right Ear: External ear normal.  Left Ear: External ear normal.  Nose: Nose normal.  Mouth/Throat: Oropharynx is clear and moist.  Eyes: Conjunctivae normal and EOM are normal.  Neck: Neck supple.  Cardiovascular: Normal rate, regular rhythm and normal heart sounds.   Pulmonary/Chest: Effort normal and breath sounds normal.  Abdominal: Soft. Bowel sounds are normal.  Musculoskeletal: Normal range of motion.  Neurological: She is alert and oriented to person, place, and time.  Skin: Skin is warm and dry.  Psychiatric: She has a normal mood and affect. Her behavior is normal. Judgment and thought content normal.   Results for orders placed in visit on 09/02/12  GLUCOSE, POCT (MANUAL RESULT ENTRY)      Component Value Range   POC Glucose 80  70 - 99 mg/dl  POCT GLYCOSYLATED HEMOGLOBIN (HGB A1C)      Component Value Range   Hemoglobin A1C 5.6    POCT UA - MICROSCOPIC ONLY      Component Value Range   WBC, Ur, HPF, POC neg     RBC, urine, microscopic 0-1     Bacteria, U Microscopic neg     Mucus, UA neg     Epithelial cells, urine per micros 0-3     Crystals, Ur, HPF, POC neg     Casts, Ur, LPF, POC neg     Yeast, UA neg    POCT URINALYSIS DIPSTICK      Component Value Range   Color, UA yellow     Clarity, UA clear     Glucose, UA neg     Bilirubin, UA neg     Ketones, UA neg     Spec Grav, UA 1.020     Blood, UA neg     pH, UA 7.0     Protein, UA neg     Urobilinogen, UA 0.2     Nitrite, UA neg     Leukocytes, UA Negative      uti resolved Dm controlled       Assessment & Plan:  All issues controlled RF meds 1 yr CPE 99mo.

## 2012-09-02 NOTE — Patient Instructions (Signed)

## 2012-09-03 ENCOUNTER — Encounter: Payer: Self-pay | Admitting: Radiology

## 2012-09-08 ENCOUNTER — Other Ambulatory Visit: Payer: Self-pay | Admitting: Internal Medicine

## 2012-09-08 ENCOUNTER — Other Ambulatory Visit: Payer: Self-pay | Admitting: Physician Assistant

## 2012-10-04 ENCOUNTER — Other Ambulatory Visit: Payer: Self-pay | Admitting: Family Medicine

## 2012-10-07 ENCOUNTER — Telehealth: Payer: Self-pay

## 2012-10-07 NOTE — Telephone Encounter (Signed)
She does not need any meds at this point. She requests 90 day supply on her meds, is FYI only

## 2012-10-07 NOTE — Telephone Encounter (Signed)
PT STATES SHE NEED ALL HER MEDS CALLED IN FOR A 90 DAY SUPPLY WITH REFILLS TO LAST A YEAR. SHE TAKES ABOUT 6 DIFFERENT MEDS. PLEASE CALL [phone]   [facility] [address]

## 2012-10-18 ENCOUNTER — Ambulatory Visit (INDEPENDENT_AMBULATORY_CARE_PROVIDER_SITE_OTHER): Payer: Managed Care, Other (non HMO) | Admitting: Emergency Medicine

## 2012-10-18 ENCOUNTER — Encounter (HOSPITAL_COMMUNITY): Payer: Self-pay | Admitting: Emergency Medicine

## 2012-10-18 ENCOUNTER — Ambulatory Visit (INDEPENDENT_AMBULATORY_CARE_PROVIDER_SITE_OTHER): Payer: Managed Care, Other (non HMO) | Admitting: Family Medicine

## 2012-10-18 ENCOUNTER — Emergency Department (HOSPITAL_COMMUNITY)
Admission: EM | Admit: 2012-10-18 | Discharge: 2012-10-18 | Disposition: A | Discharge status: Home / Self Care | Payer: Managed Care, Other (non HMO) | Attending: Emergency Medicine | Admitting: Emergency Medicine

## 2012-10-18 VITALS — BP 170/79 | HR 84 | Resp 18

## 2012-10-18 VITALS — BP 122/62 | HR 68 | Temp 97.9°F | Resp 16 | Ht 64.38 in | Wt 170.2 lb

## 2012-10-18 DIAGNOSIS — N189 Chronic kidney disease, unspecified: Secondary | ICD-10-CM | POA: Insufficient documentation

## 2012-10-18 DIAGNOSIS — R040 Epistaxis: Secondary | ICD-10-CM

## 2012-10-18 DIAGNOSIS — E119 Type 2 diabetes mellitus without complications: Secondary | ICD-10-CM | POA: Insufficient documentation

## 2012-10-18 DIAGNOSIS — E785 Hyperlipidemia, unspecified: Secondary | ICD-10-CM | POA: Insufficient documentation

## 2012-10-18 DIAGNOSIS — Z87891 Personal history of nicotine dependence: Secondary | ICD-10-CM | POA: Insufficient documentation

## 2012-10-18 DIAGNOSIS — Z79899 Other long term (current) drug therapy: Secondary | ICD-10-CM | POA: Insufficient documentation

## 2012-10-18 DIAGNOSIS — R300 Dysuria: Secondary | ICD-10-CM

## 2012-10-18 DIAGNOSIS — I129 Hypertensive chronic kidney disease with stage 1 through stage 4 chronic kidney disease, or unspecified chronic kidney disease: Secondary | ICD-10-CM | POA: Insufficient documentation

## 2012-10-18 DIAGNOSIS — N390 Urinary tract infection, site not specified: Secondary | ICD-10-CM

## 2012-10-18 LAB — POCT URINALYSIS DIPSTICK
Bilirubin, UA: NEGATIVE
Glucose, UA: NEGATIVE
Ketones, UA: NEGATIVE
Protein, UA: 30

## 2012-10-18 LAB — POCT UA - MICROSCOPIC ONLY

## 2012-10-18 MED ORDER — NITROFURANTOIN MONOHYD MACRO 100 MG PO CAPS: 100.0000 mg | ORAL_CAPSULE | Freq: Two times a day (BID) | ORAL | Status: DC

## 2012-10-18 NOTE — Progress Notes (Signed)
[facility] [address] [place] [address] [phone] [phone]  Date:  10/18/2012   Name:  Mandy Kim   DOB:  [birth year]   MRN:  [id number]  PCP:  Tally Due, MD    Chief Complaint: Dysuria   History of Present Illness:  Mandy Kim is a 66 y.o. very pleasant female patient who presents with the following:  She was actually here this morning for an uncontrolled nosebleed.  She was transferred to the ED and the bleed was cauterized.   She has had UTI symptoms for about 2 days but there was no time to mention this problem this morning.  She had noted frequency and urgency, and this morning she developed dysuria and a trace of blood when she wiped after urination.   She has not had any fevers or chills related to this illness.   She does note pain in her bilateral lower back.  No abdominal pain.  She is s/p a total hysterectomy, AAA repair and bladder tack.    Her urologist is Wilburn Mylar in [place].  She is being followed for some sort of bladder tumor noted on a recent MRI to follow- up her AAA.   [facility]: Wilburn Mylar A MD  Directions  Be the first to review  Address: [address] [place], [place] [address]  [phone]    Patient Active Problem List  Diagnosis  . Other and unspecified hyperlipidemia  . AAA (abdominal aortic aneurysm)  . Fatty liver  . Breast cancer  . Cervical cancer  . Diabetes mellitus  . Hyperlipidemia    Past Medical History  Diagnosis Date  . Chronic kidney disease   . Hypertension   . Hyperlipidemia   . Diabetes mellitus   . Cancer     Past Surgical History  Procedure Date  . Abdominal aortic aneurysm repair   . Mastectomy   . Tonsillectomy   . Bladder tacking   . Cataract extraction, bilateral   . Abdominal aortic aneurysm repair     History  Substance Use Topics  . Smoking status: Former Games developer  . Smokeless tobacco: Not on file  .  Alcohol Use: Not on file    No family history on file.  Allergies  Allergen Reactions  . Aspirin     Per pt any pain medications  . Crestor (Rosuvastatin Calcium)   . Lipitor (Atorvastatin Calcium)   . Pravastatin   . Simvastatin     Medication list has been reviewed and updated.  Current Outpatient Prescriptions on File Prior to Visit  Medication Sig Dispense Refill  . ACCU-CHEK FASTCLIX LANCETS MISC TEST TWICE A DAY  102 each  0  . Biotin 1000 MCG tablet Take 1,000 mcg by mouth daily.      . calcium citrate-vitamin D 200-200 MG-UNIT TABS Take 1 tablet by mouth 2 (two) times daily.      . cholecalciferol (VITAMIN D) 1000 UNITS tablet Take 1,000 Units by mouth daily.      . clopidogrel (PLAVIX) 75 MG tablet Take 75 mg by mouth daily.       . fish oil-omega-3 fatty acids 1000 MG capsule Take 1-2 g by mouth 2 (two) times daily. Patient takes 1 tablet in the morning and 2 tablets at bedtime      . Flaxseed, Linseed, (FLAX SEED OIL PO) Take 1 capsule by mouth 2 (two) times daily.      Marland Kitchen gemfibrozil (LOPID) 600 MG  tablet TAKE 1 TABLET BY MOUTH TWICE A DAY BEFORE MEALS  180 tablet  0  . KLOR-CON M20 20 MEQ tablet TAKE 1 TABLET BY MOUTH EVERY DAY  90 tablet  1  . metFORMIN (GLUCOPHAGE) 500 MG tablet TAKE 1 TABLET BY MOUTH EVERY DAY  30 tablet  3  . metoprolol succinate (TOPROL-XL) 25 MG 24 hr tablet Take 25 mg by mouth daily.      . Multiple Vitamin (MULTIVITAMIN) tablet Take 0.5 tablets by mouth 2 (two) times daily.       . pantoprazole (PROTONIX) 40 MG tablet TAKE 1 TABLET BY MOUTH EVERY DAY ON AN EMPTY STOMACH  90 tablet  0  . pyridOXINE (VITAMIN B-6) 100 MG tablet Take 100 mg by mouth daily.      . solifenacin (VESICARE) 10 MG tablet Take 10 mg by mouth daily.        Review of Systems:  As per HPI- otherwise negative.   Physical Examination: Filed Vitals:   10/18/12 1637  BP: 122/62  Pulse: 68  Temp: 97.9 F (36.6 C)  Resp: 16   Filed Vitals:   10/18/12 1637  Height:  5' 4.38" (1.635 m)  Weight: 170 lb 3.2 oz (77.202 kg)   Body mass index is 28.87 kg/(m^2). Ideal Body Weight: Weight in (lb) to have BMI = 25: 147.1   GEN: WDWN, NAD, Non-toxic, A & O x 3 HEENT: Atraumatic, Normocephalic. Neck supple. No masses, No LAD.  Bilateral TM wnl, oropharynx normal.  PEERL,EOMI.   There is evidence of cautery in her right nostril, but no bleeding Ears and Nose: No external deformity. CV: RRR, No M/G/R. No JVD. No thrill. No extra heart sounds. PULM: CTA B, no wheezes, crackles, rhonchi. No retractions. No resp. distress. No accessory muscle use. ABD: S, NT, ND, +BS. No rebound. No HSM.  No CVA tenderness.  No pulsatile mass EXTR: No c/c/e NEURO Normal gait.  PSYCH: Normally interactive. Conversant. Not depressed or anxious appearing.  Calm demeanor.   Results for orders placed in visit on 10/18/12  POCT UA - MICROSCOPIC ONLY      Component Value Range   WBC, Ur, HPF, POC tntc     RBC, urine, microscopic tntc     Bacteria, U Microscopic 3+     Mucus, UA positive     Epithelial cells, urine per micros 0-1     Crystals, Ur, HPF, POC neg     Casts, Ur, LPF, POC neg     Yeast, UA neg    POCT URINALYSIS DIPSTICK      Component Value Range   Color, UA yellow     Clarity, UA cloudy     Glucose, UA neg     Bilirubin, UA neg     Ketones, UA neg     Spec Grav, UA 1.025     Blood, UA large     pH, UA 5.5     Protein, UA 30     Urobilinogen, UA 0.2     Nitrite, UA neg     Leukocytes, UA large (3+)       Assessment and Plan: 1. UTI (lower urinary tract infection)  Urine culture, nitrofurantoin, macrocrystal-monohydrate, (MACROBID) 100 MG capsule  2. Dysuria  POCT UA - Microscopic Only, POCT urinalysis dipstick   Treat UTI with macrobid- her creatinine clearance is above 70 so this medication is ok. Await urine culture.  See patient instructions for more details.     Abbe Amsterdam, MD

## 2012-10-18 NOTE — ED Notes (Signed)
Pt began having nosebleed at 0600 this morning after sneezing.  Went to [facility] where rhino rocket was placed (reported as covered with vaseline gauze).  Pt continues to have some bleeding with n/v.

## 2012-10-18 NOTE — Progress Notes (Signed)
  Subjective:    Patient ID: Mandy Kim, female    DOB: 1946-06-21, 66 y.o.   MRN: [id number]  HPI Patient enters with onset this morning at 6 AM of significant right-sided nosebleed. She was unable to get the bleeding to stop with pressure. She has a history of a previous aneurysm and is currently on Plavix.   Review of Systems     Objective:   Physical Exam there is significant bleeding from the right nares. kleenex was removed and had a large clot attached to it. A rapid rhino was inserted and 20 cc of air was inserted. Bleeding persisted and the anterior portion of the nares was packed with Vaseline gauze. The air was removed from the rapid rise no it was pulled forward about 1 cm and 20 cc of air was re instillled. . Patient complains of significant nasopharyngeal pain so no further air was placed. The EMS arrived and she was transported to the hospital for further evaluation with an uncontrolled nosebleed.        Assessment & Plan:  Patient being transported to the hospital with an uncontrolled nosebleed currently on Plavix.

## 2012-10-18 NOTE — ED Provider Notes (Signed)
History     CSN: [id number]  Arrival date & time 10/18/12  6213   First MD Initiated Contact with Patient 10/18/12 [phone]      Chief Complaint  Patient presents with  . Epistaxis    Rt nare    (Consider location/radiation/quality/duration/timing/severity/associated sxs/prior treatment) Patient is a 65 y.o. female presenting with nosebleeds. The history is provided by the patient and a relative.  Epistaxis   She sneezed this morning and developed a severe right-sided nosebleed. She went to an urgent care Center where a rapid I know was placed with which has not controlled the bleeding. She is on Plavix but no other anticoagulants or antiplatelet agents.  Past Medical History  Diagnosis Date  . Chronic kidney disease   . Hypertension   . Hyperlipidemia   . Diabetes mellitus   . Cancer     Past Surgical History  Procedure Date  . Abdominal aortic aneurysm repair   . Mastectomy   . Tonsillectomy   . Bladder tacking   . Cataract extraction, bilateral   . Abdominal aortic aneurysm repair     No family history on file.  History  Substance Use Topics  . Smoking status: Former Games developer  . Smokeless tobacco: Not on file  . Alcohol Use: Not on file    OB History    Grav Para Term Preterm Abortions TAB SAB Ect Mult Living                  Review of Systems  HENT: Positive for nosebleeds.   All other systems reviewed and are negative.    Allergies  Aspirin; Crestor; Lipitor; Pravastatin; and Simvastatin  Home Medications   Current Outpatient Rx  Name  Route  Sig  Dispense  Refill  . ACCU-CHEK FASTCLIX LANCETS MISC      TEST TWICE A DAY   102 each   0   . BIOTIN 1000 MCG PO TABS   Oral   Take 1,000 mcg by mouth daily.         Marland Kitchen CALCIUM CITRATE-VITAMIN D 200-200 MG-UNIT PO TABS   Oral   Take 1 tablet by mouth 2 (two) times daily.         Marland Kitchen VITAMIN D 1000 UNITS PO TABS   Oral   Take 1,000 Units by mouth daily.         Marland Kitchen CLOPIDOGREL BISULFATE 75  MG PO TABS   Oral   Take by mouth daily.         . OMEGA-3 FATTY ACIDS 1000 MG PO CAPS   Oral   Take 1-2 g by mouth 2 (two) times daily. Patient takes 1 tablet in the morning and 2 tablets at bedtime         . FLAX SEED OIL 1000 MG PO CAPS   Oral   Take 1,200 mg by mouth 2 (two) times daily.         Marland Kitchen FLUTICASONE PROPIONATE 50 MCG/ACT NA SUSP   Nasal   Place 2 sprays into the nose daily.   1 g   5   . GEMFIBROZIL 600 MG PO TABS      TAKE 1 TABLET BY MOUTH TWICE A DAY BEFORE MEALS   180 tablet   0   . KLOR-CON M20 20 MEQ PO TBCR      TAKE 1 TABLET BY MOUTH EVERY DAY   90 tablet   1   . METFORMIN HCL 500 MG PO TABS  TAKE 1 TABLET BY MOUTH EVERY DAY   30 tablet   3   . METOPROLOL SUCCINATE ER 25 MG PO TB24      EVERY DAY   90 tablet   0   . METOPROLOL TARTRATE PO   Oral   Take by mouth.         . ONE-DAILY MULTI VITAMINS PO TABS   Oral   Take 1 tablet by mouth daily.         Marland Kitchen PANTOPRAZOLE SODIUM 40 MG PO TBEC      TAKE 1 TABLET BY MOUTH EVERY DAY ON AN EMPTY STOMACH   90 tablet   0   . VITAMIN B-6 100 MG PO TABS   Oral   Take 100 mg by mouth daily.         Marland Kitchen SOLIFENACIN SUCCINATE 5 MG PO TABS   Oral   Take 5 mg by mouth daily.           BP 146/69  Pulse 73  Resp 16  SpO2 95%  Physical Exam  Nursing note and vitals reviewed. 66 year old female, somewhat anxious, but in no acute distress. Vital signs are significant for mild hypertension with blood pressure 146/69. Oxygen saturation is 95%, which is normal. Head is normocephalic and atraumatic. PERRLA, EOMI. Oropharynx is clear. Rapid rhino with vaselinized gauze packing present in the right nostril with blood oozing around the packing. There is also blood going down the nasopharynx. Neck is nontender and supple without adenopathy or JVD. Back is nontender and there is no CVA tenderness. Lungs are clear without rales, wheezes, or rhonchi. Chest is nontender. Heart has  regular rate and rhythm without murmur. Abdomen is soft, flat, nontender without masses or hepatosplenomegaly and peristalsis is normoactive. Extremities have no cyanosis or edema, full range of motion is present. Skin is warm and dry without rash. Neurologic: Mental status is normal, cranial nerves are intact, there are no motor or sensory deficits.   ED Course  EPISTAXIS MANAGEMENT Date/Time: 10/18/2012 9:55 AM Performed by: Dione Booze Authorized by: Preston Fleeting, Janat Tabbert Consent: Verbal consent obtained. Written consent obtained. Risks and benefits: risks, benefits and alternatives were discussed Consent given by: patient Patient understanding: patient states understanding of the procedure being performed Patient consent: the patient's understanding of the procedure matches consent given Procedure consent: procedure consent matches procedure scheduled Relevant documents: relevant documents present and verified Site marked: the operative site was marked Required items: required blood products, implants, devices, and special equipment available Patient identity confirmed: verbally with patient and arm band Preparation: Patient was prepped and draped in the usual sterile fashion. Anesthesia: local infiltration Local anesthetic: lidocaine 1% with epinephrine Patient sedated: no Treatment site: right anterior Repair method: suction and silver nitrate Post-procedure assessment: bleeding stopped Treatment complexity: complex Patient tolerance: Patient tolerated the procedure well with no immediate complications. Comments: Bleeding site appeared to be proximal to where the Rapid Rhino was inflated, and distal to where the nose had been packed. Cautery was difficult because of new bleeding sites started to appear after the initial bleeding site was cauterized. However, bleeding was able to be controlled. Patient will be observed in the ED for recurrence of bleeding.   she was observed for  approximately 1.5 hours after cautery, and had no recurrence of bleeding.  Labs Reviewed - No data to display No results found.   Date: 10/18/2012  Rate: 76  Rhythm: normal sinus rhythm and premature ventricular contractions (PVC)  QRS  Axis: normal  Intervals: PR prolonged  ST/T Wave abnormalities: normal  Conduction Disutrbances:first-degree A-V block   Narrative Interpretation: Occasional PVC, first degree AV block. No prior ECG available for comparison the  Old EKG Reviewed: none available    1. Anterior Epistaxis       MDM  Epistaxis which has been difficult to control. The Rapid Rhino was removed. It was noted that this was inserted too far into the nostril. Vasculitis gauze packing was also removed. The area was inspected with aid of a nasal speculum and bleeding site was seen in Kiesselbach's plexus in the nasal septum. I do believe that the rapid Rhino was inserted too deep to be applying pressure to the actual bleeding site.        Dione Booze, MD 10/18/12 [phone]

## 2012-10-18 NOTE — ED Notes (Signed)
Pt undressed, in gown, on monitor, continuous pulse oximetry and blood pressure cuff; EKG performed; family at bedside 

## 2012-10-18 NOTE — Patient Instructions (Addendum)
Please give me a call if you are not better in the next 2 days. Drink plenty of water.

## 2012-10-21 LAB — URINE CULTURE: Colony Count: >=100000

## 2012-10-29 LAB — HM COLONOSCOPY

## 2012-11-06 ENCOUNTER — Ambulatory Visit (INDEPENDENT_AMBULATORY_CARE_PROVIDER_SITE_OTHER): Payer: Managed Care, Other (non HMO) | Admitting: Emergency Medicine

## 2012-11-06 VITALS — BP 132/69 | HR 62 | Temp 97.8°F | Resp 18 | Ht 64.0 in | Wt 175.0 lb

## 2012-11-06 DIAGNOSIS — Z0289 Encounter for other administrative examinations: Secondary | ICD-10-CM

## 2012-11-06 NOTE — Progress Notes (Signed)
  Subjective:    Patient ID: Mandy Kim, female    DOB: 1946-02-03, 66 y.o.   MRN: [id number]  HPI  DOT.  History endovascular repair AAA. No complications.  Cleared by surgeon after 3 months.  Just had MRI  And will bring copy for inclusion in record  Review of Systems    As per HPI, otherwise negative.   Objective:   Physical Exam  GEN: WDWN, NAD, Non-toxic, A & O x 3 HEENT: Atraumatic, Normocephalic. Neck supple. No masses, No LAD. Ears and Nose: No external deformity. CV: RRR, No M/G/R. No JVD. No thrill. No extra heart sounds. PULM: CTA B, no wheezes, crackles, rhonchi. No retractions. No resp. distress. No accessory muscle use. ABD: S, NT, ND, +BS. No rebound. No HSM. EXTR: No c/c/e NEURO Normal gait.  PSYCH: Normally interactive. Conversant. Not depressed or anxious appearing.  Calm demeanor.        Assessment & Plan:   Fit

## 2012-11-11 NOTE — Progress Notes (Signed)
Reviewed and agree.

## 2012-12-03 ENCOUNTER — Other Ambulatory Visit: Payer: Self-pay | Admitting: Physician Assistant

## 2012-12-10 ENCOUNTER — Other Ambulatory Visit: Payer: Self-pay | Admitting: Physician Assistant

## 2012-12-10 ENCOUNTER — Other Ambulatory Visit: Payer: Self-pay | Admitting: Family Medicine

## 2012-12-12 ENCOUNTER — Other Ambulatory Visit: Payer: Self-pay | Admitting: Internal Medicine

## 2012-12-16 ENCOUNTER — Other Ambulatory Visit: Payer: Self-pay | Admitting: *Deleted

## 2012-12-20 ENCOUNTER — Telehealth: Payer: Self-pay

## 2012-12-20 NOTE — Telephone Encounter (Signed)
Pt needs he dot long form mailed to [facility] [employer]  [address]  [place] [id number]

## 2012-12-23 NOTE — Telephone Encounter (Signed)
DOT long form mailed.

## 2013-01-13 ENCOUNTER — Ambulatory Visit (INDEPENDENT_AMBULATORY_CARE_PROVIDER_SITE_OTHER): Payer: Medicare PPO | Admitting: Internal Medicine

## 2013-01-13 ENCOUNTER — Encounter: Payer: Self-pay | Admitting: Internal Medicine

## 2013-01-13 VITALS — BP 138/68 | HR 61 | Temp 97.7°F | Resp 16 | Ht 64.5 in | Wt 182.0 lb

## 2013-01-13 DIAGNOSIS — I10 Essential (primary) hypertension: Secondary | ICD-10-CM

## 2013-01-13 DIAGNOSIS — Z0000 Encounter for general adult medical examination without abnormal findings: Secondary | ICD-10-CM

## 2013-01-13 DIAGNOSIS — I714 Abdominal aortic aneurysm, without rupture, unspecified: Secondary | ICD-10-CM

## 2013-01-13 DIAGNOSIS — Z8541 Personal history of malignant neoplasm of cervix uteri: Secondary | ICD-10-CM

## 2013-01-13 DIAGNOSIS — Z853 Personal history of malignant neoplasm of breast: Secondary | ICD-10-CM

## 2013-01-13 DIAGNOSIS — K219 Gastro-esophageal reflux disease without esophagitis: Secondary | ICD-10-CM

## 2013-01-13 DIAGNOSIS — R7303 Prediabetes: Secondary | ICD-10-CM

## 2013-01-13 DIAGNOSIS — Z8679 Personal history of other diseases of the circulatory system: Secondary | ICD-10-CM

## 2013-01-13 DIAGNOSIS — E7800 Pure hypercholesterolemia, unspecified: Secondary | ICD-10-CM

## 2013-01-13 LAB — CBC WITH DIFFERENTIAL/PLATELET
HCT: 39.8 % (ref 36.0–46.0)
Hemoglobin: 13.9 g/dL (ref 12.0–15.0)
Lymphocytes Relative: 46 % (ref 12–46)
Lymphs Abs: 2.6 10*3/uL (ref 0.7–4.0)
Monocytes Absolute: 0.5 10*3/uL (ref 0.1–1.0)
Monocytes Relative: 8 % (ref 3–12)
Neutro Abs: 2.4 10*3/uL (ref 1.7–7.7)
Neutrophils Relative %: 42 % — ABNORMAL LOW (ref 43–77)
RBC: 4.83 MIL/uL (ref 3.87–5.11)

## 2013-01-13 LAB — COMPREHENSIVE METABOLIC PANEL
Albumin: 4.8 g/dL (ref 3.5–5.2)
Alkaline Phosphatase: 42 U/L (ref 39–117)
CO2: 23 mEq/L (ref 19–32)
Calcium: 9.8 mg/dL (ref 8.4–10.5)
Chloride: 104 mEq/L (ref 96–112)
Glucose, Bld: 158 mg/dL — ABNORMAL HIGH (ref 70–99)
Potassium: 4.2 mEq/L (ref 3.5–5.3)
Sodium: 137 mEq/L (ref 135–145)
Total Protein: 7.4 g/dL (ref 6.0–8.3)

## 2013-01-13 LAB — POCT UA - MICROSCOPIC ONLY: Crystals, Ur, HPF, POC: NEGATIVE

## 2013-01-13 LAB — LIPID PANEL
Cholesterol: 229 mg/dL — ABNORMAL HIGH (ref 0–200)
Total CHOL/HDL Ratio: 7.2 Ratio
VLDL: 58 mg/dL — ABNORMAL HIGH (ref 0–40)

## 2013-01-13 LAB — POCT URINALYSIS DIPSTICK
Ketones, UA: NEGATIVE
Nitrite, UA: NEGATIVE
Protein, UA: NEGATIVE

## 2013-01-13 LAB — POCT GLYCOSYLATED HEMOGLOBIN (HGB A1C): Hemoglobin A1C: 6.5

## 2013-01-13 LAB — GLUCOSE, POCT (MANUAL RESULT ENTRY): POC Glucose: 141 mg/dl — AB (ref 70–99)

## 2013-01-13 MED ORDER — CLOPIDOGREL BISULFATE 75 MG PO TABS: 75.0000 mg | ORAL_TABLET | Freq: Every day | ORAL | Status: DC

## 2013-01-13 MED ORDER — GEMFIBROZIL 600 MG PO TABS: 600.0000 mg | ORAL_TABLET | Freq: Two times a day (BID) | ORAL | Status: DC

## 2013-01-13 MED ORDER — METFORMIN HCL 500 MG PO TABS: 500.0000 mg | ORAL_TABLET | Freq: Two times a day (BID) | ORAL | Status: DC

## 2013-01-13 MED ORDER — POTASSIUM CHLORIDE CRYS ER 20 MEQ PO TBCR: 20.0000 meq | EXTENDED_RELEASE_TABLET | Freq: Every day | ORAL | Status: DC

## 2013-01-13 MED ORDER — METOPROLOL SUCCINATE ER 25 MG PO TB24: 25.0000 mg | ORAL_TABLET | Freq: Every day | ORAL | Status: DC

## 2013-01-13 NOTE — Progress Notes (Signed)
  Subjective:    Patient ID: Mandy Kim, female    DOB: 01-20-1946, 67 y.o.   MRN: [id number]  HPI Feels good and doing well. Colonoscopy normal 10/13. CT abd normal and AAA graft stable last month with surgeon. Breast and cervial cancer stable, no recurrence. Severe lipidemia allergy to statins, on gemfibozil. Complex med review done. Glucose intolerance may need incr in metformin.    Review of Systems Frequent uti.    Objective:   Physical Exam  Vitals reviewed. Constitutional: She is oriented to person, place, and time. She appears well-developed and well-nourished. No distress.  HENT:  Right Ear: External ear normal.  Left Ear: External ear normal.  Nose: Nose normal.  Mouth/Throat: Oropharynx is clear and moist.  Eyes: EOM are normal. Pupils are equal, round, and reactive to light. No scleral icterus.  Neck: Normal range of motion. Neck supple. No tracheal deviation present. No thyromegaly present.  Cardiovascular: Normal rate, normal heart sounds and intact distal pulses.   No murmur heard. Pulmonary/Chest: Effort normal and breath sounds normal. No respiratory distress. She exhibits no tenderness.  Abdominal: Soft. Bowel sounds are normal. She exhibits no mass. There is no tenderness.  Musculoskeletal: Normal range of motion.  Lymphadenopathy:    She has no cervical adenopathy.  Neurological: She is alert and oriented to person, place, and time. No cranial nerve deficit. She exhibits normal muscle tone. Coordination normal.  Good balance one leg and no drift  Skin: No rash noted.  Psychiatric: She has a normal mood and affect. Her behavior is normal. Judgment normal.   Results for orders placed in visit on 01/13/13  POCT UA - MICROSCOPIC ONLY      Result Value Range   WBC, Ur, HPF, POC 0-2     RBC, urine, microscopic 0-5     Bacteria, U Microscopic neg     Mucus, UA trace     Epithelial cells, urine per micros 0-4     Crystals, Ur, HPF, POC neg     Casts,  Ur, LPF, POC neg     Yeast, UA neg    POCT URINALYSIS DIPSTICK      Result Value Range   Color, UA yellow     Clarity, UA clear     Glucose, UA neg     Bilirubin, UA neg     Ketones, UA neg     Spec Grav, UA >=1.030     Blood, UA trace     pH, UA 5.0     Protein, UA neg     Urobilinogen, UA 0.2     Nitrite, UA neg     Leukocytes, UA Trace            Assessment & Plan:  RF meds 1 year. Refer to cardiology r/o CAD routine appt.

## 2013-01-13 NOTE — Patient Instructions (Signed)

## 2013-01-18 ENCOUNTER — Encounter: Payer: Self-pay | Admitting: Radiology

## 2013-01-27 ENCOUNTER — Encounter: Payer: Self-pay | Admitting: Cardiovascular Disease

## 2013-01-28 ENCOUNTER — Ambulatory Visit: Payer: Managed Care, Other (non HMO) | Admitting: Cardiovascular Disease

## 2013-03-28 ENCOUNTER — Other Ambulatory Visit: Payer: Self-pay | Admitting: Physician Assistant

## 2013-04-02 DIAGNOSIS — N816 Rectocele: Secondary | ICD-10-CM | POA: Insufficient documentation

## 2013-04-02 DIAGNOSIS — R3915 Urgency of urination: Secondary | ICD-10-CM | POA: Insufficient documentation

## 2013-04-02 DIAGNOSIS — N390 Urinary tract infection, site not specified: Secondary | ICD-10-CM | POA: Insufficient documentation

## 2013-04-02 DIAGNOSIS — N3941 Urge incontinence: Secondary | ICD-10-CM | POA: Insufficient documentation

## 2013-04-08 ENCOUNTER — Other Ambulatory Visit: Payer: Self-pay | Admitting: Internal Medicine

## 2013-04-08 ENCOUNTER — Other Ambulatory Visit: Payer: Self-pay | Admitting: Physician Assistant

## 2013-07-14 ENCOUNTER — Ambulatory Visit: Payer: Medicare PPO | Admitting: Internal Medicine

## 2013-07-21 ENCOUNTER — Ambulatory Visit (INDEPENDENT_AMBULATORY_CARE_PROVIDER_SITE_OTHER): Payer: Medicare PPO | Admitting: Internal Medicine

## 2013-07-21 ENCOUNTER — Encounter: Payer: Self-pay | Admitting: Internal Medicine

## 2013-07-21 VITALS — BP 137/68 | HR 59 | Temp 98.0°F | Resp 17 | Ht 65.5 in | Wt 180.0 lb

## 2013-07-21 DIAGNOSIS — Z79899 Other long term (current) drug therapy: Secondary | ICD-10-CM

## 2013-07-21 DIAGNOSIS — R7989 Other specified abnormal findings of blood chemistry: Secondary | ICD-10-CM

## 2013-07-21 DIAGNOSIS — W57XXXA Bitten or stung by nonvenomous insect and other nonvenomous arthropods, initial encounter: Secondary | ICD-10-CM

## 2013-07-21 DIAGNOSIS — R946 Abnormal results of thyroid function studies: Secondary | ICD-10-CM

## 2013-07-21 DIAGNOSIS — E785 Hyperlipidemia, unspecified: Secondary | ICD-10-CM

## 2013-07-21 DIAGNOSIS — I10 Essential (primary) hypertension: Secondary | ICD-10-CM

## 2013-07-21 LAB — COMPREHENSIVE METABOLIC PANEL
AST: 32 U/L (ref 0–37)
Albumin: 4.8 g/dL (ref 3.5–5.2)
Alkaline Phosphatase: 36 U/L — ABNORMAL LOW (ref 39–117)
Potassium: 4.3 mEq/L (ref 3.5–5.3)
Sodium: 136 mEq/L (ref 135–145)
Total Bilirubin: 0.6 mg/dL (ref 0.3–1.2)
Total Protein: 7.2 g/dL (ref 6.0–8.3)

## 2013-07-21 LAB — POCT UA - MICROSCOPIC ONLY
Crystals, Ur, HPF, POC: NEGATIVE
Mucus, UA: NEGATIVE
Yeast, UA: NEGATIVE

## 2013-07-21 LAB — CBC
MCH: 29.3 pg (ref 26.0–34.0)
MCHC: 34.8 g/dL (ref 30.0–36.0)
Platelets: 277 10*3/uL (ref 150–400)
RDW: 15 % (ref 11.5–15.5)

## 2013-07-21 LAB — POCT URINALYSIS DIPSTICK
Bilirubin, UA: NEGATIVE
Blood, UA: NEGATIVE
Ketones, UA: NEGATIVE
Nitrite, UA: NEGATIVE
Protein, UA: NEGATIVE
pH, UA: 5.5

## 2013-07-21 NOTE — Patient Instructions (Signed)

## 2013-07-21 NOTE — Progress Notes (Signed)
  Subjective:    Patient ID: Mandy Kim, female    DOB: 07-12-1946, 67 y.o.   MRN: [id number]  HPI Review vaccines All issues stable, TSH was creeping up and needs reck. 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  Review of Systems unchanged    Objective:   Physical Exam  Constitutional: She is oriented to person, place, and time. She appears well-developed and well-nourished. No distress.  HENT:  Head: Normocephalic.  Eyes: EOM are normal. No scleral icterus.  Neck: Normal range of motion. Neck supple. No thyromegaly present.  Cardiovascular: Normal rate, regular rhythm and normal heart sounds.   Pulmonary/Chest: Effort normal and breath sounds normal.  Abdominal: Soft. She exhibits no mass. There is no tenderness.  Musculoskeletal: Normal range of motion.  Neurological: She is alert and oriented to person, place, and time. A cranial nerve deficit is present. She exhibits normal muscle tone. Coordination normal.  Psychiatric: She has a normal mood and affect. Her behavior is normal.          Assessment & Plan:  HTN/Lipids stable RF meds 1 year

## 2013-07-22 ENCOUNTER — Other Ambulatory Visit: Payer: Self-pay | Admitting: *Deleted

## 2013-07-22 LAB — B. BURGDORFI ANTIBODIES: B burgdorferi Ab IgG+IgM: 0.43 {ISR}

## 2013-07-22 MED ORDER — DOXYCYCLINE HYCLATE 100 MG PO TABS: 100.0000 mg | ORAL_TABLET | Freq: Two times a day (BID) | ORAL | Status: DC

## 2013-07-22 NOTE — Progress Notes (Unsigned)
Called in Rx for Doxycycline for a positive RMSF performed 8/18.

## 2013-07-24 ENCOUNTER — Ambulatory Visit (INDEPENDENT_AMBULATORY_CARE_PROVIDER_SITE_OTHER): Payer: Medicare PPO | Admitting: Internal Medicine

## 2013-07-24 VITALS — BP 128/80 | HR 60 | Temp 98.1°F | Resp 18 | Ht 65.5 in | Wt 180.0 lb

## 2013-07-24 DIAGNOSIS — Z79899 Other long term (current) drug therapy: Secondary | ICD-10-CM

## 2013-07-24 DIAGNOSIS — A770 Spotted fever due to Rickettsia rickettsii: Secondary | ICD-10-CM

## 2013-07-24 DIAGNOSIS — A779 Spotted fever, unspecified: Secondary | ICD-10-CM

## 2013-07-24 NOTE — Progress Notes (Signed)
  Subjective:    Patient ID: Mandy Kim, female    DOB: Jul 31, 1946, 67 y.o.   MRN: [id number]  HPI  67 YO female patient returns to the clinic today to follow up on a positive RMSF blood test. Patient states she is feeling "crummy". She has been fatigued. The doxycycline makes her nauseated. She has been taking it with food and it helps. She has body aches still.  Body aches and head aches Her vision gets cloudy. Patient reports she thinks that she has some allergies acting up. She has used eye drops in the past. She had cataracts removed last fall and her eyes have never been right.  Had 3 doses of doxycycline  Lyme screen neg. Review of Systems     Objective:   Physical Exam  Vitals reviewed. Constitutional: She is oriented to person, place, and time. She appears well-developed and well-nourished. No distress.  HENT:  Head: Normocephalic.  Eyes: EOM are normal.  Neck: Normal range of motion. Neck supple.  Cardiovascular: Normal rate.   Pulmonary/Chest: Effort normal.  Musculoskeletal: Normal range of motion.  Neurological: She is alert and oriented to person, place, and time. No cranial nerve deficit. She exhibits normal muscle tone. Coordination normal.  Skin: Rash noted.  Psychiatric: She has a normal mood and affect.   Has few petechiae on wrist and legs       Assessment & Plan:  Finish doxy

## 2013-07-24 NOTE — Patient Instructions (Signed)
[place] Spotted Fever [place] Sp[place] Fever (RMSF) is the oldest known tick-borne disease of people in the [place]. This disease was named because it was first described among people in the [place] area who had an illness characterized by a rash with red-purple-black spots. This disease is caused by a rickettsia (Rickettsia rickettsii), a bacteria carried by the tick. The [place] wood tick and the [place] dog tick, acquire and transmit the RMSF bacteria (pictures NOT actual size). When a larval, nymphal or adult tick feeds on an infected rodent or larger animal, the tick can become infected. Infected adult ticks then feed on people who may then get RMSF. The tick transmits the disease to humans during a prolonged period of feeding that lasts many hours, days or even a couple weeks. The bite is painless and frequently goes unnoticed. An infected female tick may also pass the rickettsial bacteria to her eggs that then may mature to be infected adult ticks. The rickettsia that causes RMSF can also get into a person's body through damaged skin. A tick bite is not necessary. People can get RMSF if they crush a tick and get it's blood or body fluids on their skin through a small cut or sore.  DIAGNOSIS Diagnosis is made by laboratory tests.  TREATMENT Treatment is with antibiotics (medications that kill rickettsia and other bacteria). Immediate treatment usually prevents death. GEOGRAPHIC RANGE This disease was reported only in the [place] until 1931. RMSF has more recently been described among individuals in all states except [place], [place] and [place]. The highest reported incidences of RMSF now occur among residents of [place], [place], [place] and the [place]. TIME OF YEAR  Most cases are diagnosed during late spring and summer when ticks are most active. However, especially in the warmer southern states, a few cases occur during the winter. SYMPTOMS    Symptoms of RMSF begin from 2 to 14 days after a tick bite. The most common early symptoms are fever, muscle aches and headache followed by nausea (feeling sick to your stomach) or vomiting.  The RMSF rash is typically delayed until 3 or more days after symptom onset, and eventually develops in 9 of 10 infected patients by the 5th day of illness. If the disease is not treated it can cause death. If you get a fever, headache, muscle aches, rash, nausea or vomiting within 2 weeks of a possible tick bite or exposure you should see your caregiver immediately. PREVENTION Ticks prefer to hide in shady, moist ground litter. They can often be found above the ground clinging to tall grass, brush, shrubs and low tree branches. They also inhabit lawns and gardens, especially at the edges of woodlands and around old stone walls. Within the areas where ticks generally live, no naturally vegetated area can be considered completely free of infected ticks. The best precaution against RMSF is to avoid contact with soil, leaf litter and vegetation as much as possible in tick infested areas. For those who enjoy gardening or walking in their yards, clear brush and mow tall grass around houses and at the edges of gardens. This may help reduce the tick population in the immediate area. Applications of chemical insecticides by a licensed professional in the spring (late May) and Fall (September) will also control ticks, especially in heavily infested areas. Treatment will never get rid of all the ticks. Getting rid of small animal populations that host ticks will also decrease the tick population. When working in the garden,   pruning shrubs, or handling soil and vegetation, wear light-colored protective clothing and gloves. Spot-check often to prevent ticks from reaching the skin. Ticks cannot jump or fly. They will not drop from an above-ground perch onto a passing animal. Once a tick gains access to human skin it climbs upward  until it reaches a more protected area. For example, the back of the knee, groin, navel, armpit, ears or nape of the neck. It then begins the slow process of embedding itself in the skin. Campers, hikers, field workers, and others who spend time in wooded, brushy or tall grassy areas can avoid exposure to ticks by using the following precautions:  Wear light-colored clothing with a tight weave to spot ticks more easily and prevent contact with the skin.  Wear long pants tucked into socks, long-sleeved shirts tucked into pants and enclosed shoes or boots along with insect repellent.  Spray clothes with insect repellent containing either DEET or Permethrin. Only DEET can be used on exposed skin. Follow the manufacturer's directions carefully.  Wear a hat and keep long hair pulled back.  Stay on cleared, well-worn trails whenever possible.  Spot-check yourself and others often for the presence of ticks on clothes. If you find one, there are likely to be others. Check thoroughly.  Remove clothes after leaving tick-infested areas. If possible, wash them to eliminate any unseen ticks. Check yourself, your children and any pets from head to toe for the presence of ticks.  Shower and shampoo. You can greatly reduce your chances of contracting RMSF if you remove attached ticks as soon as possible. Regular checks of the body, including all body sites covered by hair (head, armpits, genitals), allow removal of the tick before rickettsial transmission. To remove an attached tick, use a forceps or tweezers to detach the intact tick without leaving mouth parts in the skin. The tick bite wound should be cleansed after tick removal. Remember the most common symptoms of RMSF are fever, muscle aches, headache and nausea or vomiting with a later onset of rash. If you get these symptoms after a tick bite and while living in an area where RMSF is found, RMSF should be suspected. If the disease is not treated, it can  cause death. See your caregiver immediately if you get these symptoms. Do this even if not aware of a tick bite. Document Released: 03/04/2001 Document Revised: 02/12/2012 Document Reviewed: 10/25/2009 [employer] Patient Information 2014 [employer].  

## 2013-09-25 ENCOUNTER — Other Ambulatory Visit: Payer: Self-pay | Admitting: Internal Medicine

## 2013-12-04 HISTORY — PX: CATARACT EXTRACTION W/ INTRAOCULAR LENS  IMPLANT, BILATERAL: SHX1307

## 2014-01-19 ENCOUNTER — Ambulatory Visit (INDEPENDENT_AMBULATORY_CARE_PROVIDER_SITE_OTHER): Payer: Medicare PPO | Admitting: Family Medicine

## 2014-01-19 VITALS — BP 133/62 | HR 59 | Temp 97.6°F | Resp 16 | Ht 65.0 in | Wt 190.0 lb

## 2014-01-19 DIAGNOSIS — R300 Dysuria: Secondary | ICD-10-CM

## 2014-01-19 DIAGNOSIS — I714 Abdominal aortic aneurysm, without rupture, unspecified: Secondary | ICD-10-CM

## 2014-01-19 DIAGNOSIS — N390 Urinary tract infection, site not specified: Secondary | ICD-10-CM

## 2014-01-19 DIAGNOSIS — C50919 Malignant neoplasm of unspecified site of unspecified female breast: Secondary | ICD-10-CM

## 2014-01-19 DIAGNOSIS — E119 Type 2 diabetes mellitus without complications: Secondary | ICD-10-CM

## 2014-01-19 DIAGNOSIS — C539 Malignant neoplasm of cervix uteri, unspecified: Secondary | ICD-10-CM

## 2014-01-19 DIAGNOSIS — L989 Disorder of the skin and subcutaneous tissue, unspecified: Secondary | ICD-10-CM

## 2014-01-19 DIAGNOSIS — G629 Polyneuropathy, unspecified: Secondary | ICD-10-CM

## 2014-01-19 DIAGNOSIS — G609 Hereditary and idiopathic neuropathy, unspecified: Secondary | ICD-10-CM

## 2014-01-19 LAB — LIPID PANEL
CHOL/HDL RATIO: 6 ratio
Cholesterol: 169 mg/dL (ref 0–200)
HDL: 28 mg/dL — AB (ref >39)
LDL Cholesterol: 73 mg/dL (ref 0–99)
Triglycerides: 341 mg/dL — ABNORMAL HIGH (ref <150)
VLDL: 68 mg/dL — AB (ref 0–40)

## 2014-01-19 LAB — POCT UA - MICROSCOPIC ONLY
CASTS, UR, LPF, POC: NEGATIVE
CRYSTALS, UR, HPF, POC: NEGATIVE
Mucus, UA: POSITIVE
Yeast, UA: NEGATIVE

## 2014-01-19 LAB — COMPREHENSIVE METABOLIC PANEL
ALT: 63 U/L — AB (ref 0–35)
AST: 43 U/L — AB (ref 0–37)
Albumin: 4.4 g/dL (ref 3.5–5.2)
Alkaline Phosphatase: 50 U/L (ref 39–117)
BILIRUBIN TOTAL: 0.5 mg/dL (ref 0.2–1.2)
BUN: 18 mg/dL (ref 6–23)
CO2: 24 mEq/L (ref 19–32)
CREATININE: 0.73 mg/dL (ref 0.50–1.10)
Calcium: 9.4 mg/dL (ref 8.4–10.5)
Chloride: 104 mEq/L (ref 96–112)
Glucose, Bld: 134 mg/dL — ABNORMAL HIGH (ref 70–99)
Potassium: 4.4 mEq/L (ref 3.5–5.3)
Sodium: 138 mEq/L (ref 135–145)
Total Protein: 7.1 g/dL (ref 6.0–8.3)

## 2014-01-19 LAB — CBC
HEMATOCRIT: 38.7 % (ref 36.0–46.0)
Hemoglobin: 13.6 g/dL (ref 12.0–15.0)
MCH: 29.4 pg (ref 26.0–34.0)
MCHC: 35.1 g/dL (ref 30.0–36.0)
MCV: 83.8 fL (ref 78.0–100.0)
PLATELETS: 271 10*3/uL (ref 150–400)
RBC: 4.62 MIL/uL (ref 3.87–5.11)
RDW: 15.7 % — AB (ref 11.5–15.5)
WBC: 5.5 10*3/uL (ref 4.0–10.5)

## 2014-01-19 LAB — POCT URINALYSIS DIPSTICK
Bilirubin, UA: NEGATIVE
Glucose, UA: NEGATIVE
Ketones, UA: NEGATIVE
Nitrite, UA: NEGATIVE
PH UA: 6
PROTEIN UA: NEGATIVE
RBC UA: NEGATIVE
SPEC GRAV UA: 1.02
UROBILINOGEN UA: 0.2

## 2014-01-19 LAB — MICROALBUMIN, URINE: MICROALB UR: 1.36 mg/dL (ref 0.00–1.89)

## 2014-01-19 LAB — POCT GLYCOSYLATED HEMOGLOBIN (HGB A1C): HEMOGLOBIN A1C: 7.4

## 2014-01-19 MED ORDER — GABAPENTIN 300 MG PO CAPS: 300.0000 mg | ORAL_CAPSULE | Freq: Three times a day (TID) | ORAL | Status: DC

## 2014-01-19 MED ORDER — CEPHALEXIN 500 MG PO CAPS
500.0000 mg | ORAL_CAPSULE | Freq: Two times a day (BID) | ORAL | Status: DC
Start: 2014-01-19 — End: 2014-04-28

## 2014-01-19 NOTE — Progress Notes (Addendum)
[facility] [address], [place] [address] [phone]  Date:  01/19/2014   Name:  Mandy Kim   DOB:  05/13/1946   MRN:  [id number]  PCP:  Kennon Portela, MD    Chief Complaint: Urinary Frequency, Dysuria, Establish Care and Nevus   History of Present Illness:  Mandy Kim is a 68 y.o. very pleasant female patient who presents with the following:  Here today with a couple of concerns.  She notes "burning in the bottoms of my feet.  They're on fire."  She has noticed this for 20 or 30 years, never tried anything for it.  Being barefoot makes her feel better.  She does have DM, but this was just diagnosed about 3 years ago so she does not think this is the complete cause for her foot pain.   She also notes urinary sx of dysuria, and frequency.  She has noted this since November. She did take an antibiotic but is not quite sure what it was; it was an extra rx given to her by another MD.  She took this in December.  Her sx came back however  She also has a mole on her face, in the right naso -labial fold that is exacerbated by her sleep apnea mask and by her tendency to pick at it.    She is fasting today  She also notes that she has slight weakens in her left arm for about one month.  No pain.  She has a history of 3 herniated discs in her neck for about 16 years.  However she has never had this issue with her arm in the past, except she has noted that occasionally her arm would "just drop" if she reached for something for about one year.  She has not noted any numbness or pain, no other neurological sx  Patient Active Problem List   Diagnosis Date Noted  . Other and unspecified hyperlipidemia 02/12/2012  . AAA (abdominal aortic aneurysm) 02/12/2012  . Fatty liver 02/12/2012  . Breast cancer 02/12/2012  . Cervical cancer 02/12/2012  . Diabetes mellitus 02/12/2012  . Hyperlipidemia 02/12/2012    Past Medical History  Diagnosis Date  . Chronic  kidney disease   . Hypertension   . Hyperlipidemia   . Diabetes mellitus   . Cancer   . Allergy   . GERD (gastroesophageal reflux disease)   . Cataract   . Diverticulitis   . Sleep apnea     has c pap machine    Past Surgical History  Procedure Laterality Date  . Abdominal aortic aneurysm repair    . Mastectomy    . Tonsillectomy    . Bladder tacking    . Cataract extraction, bilateral    . Abdominal aortic aneurysm repair    . Abdominal hysterectomy  2000  . Breast surgery    . Eye surgery      History  Substance Use Topics  . Smoking status: Former Smoker -- 30 years    Types: Cigarettes    Quit date: 06/07/1999  . Smokeless tobacco: Not on file  . Alcohol Use: Not on file    Family History  Problem Relation Age of Onset  . Cancer Mother   . Pneumonia Mother   . Thyroid disease Mother   . Thyroid disease Sister   . Obesity Daughter   . Hypertension Daughter   . Thyroid disease Son   . Pneumonia Maternal Grandmother   .  Heart attack Maternal Grandfather     Allergies  Allergen Reactions  . Aspirin     Per pt any pain medications  . Crestor [Rosuvastatin Calcium]   . Lipitor [Atorvastatin Calcium]   . Pravastatin   . Simvastatin     Medication list has been reviewed and updated.  Current Outpatient Prescriptions on File Prior to Visit  Medication Sig Dispense Refill  . ACCU-CHEK AVIVA PLUS test strip USE AS DIRECTED  100 each  2  . ACCU-CHEK FASTCLIX LANCETS MISC TEST TWICE A DAY  102 each  2  . Biotin 1000 MCG tablet Take 1,000 mcg by mouth daily.      . calcium citrate-vitamin D 200-200 MG-UNIT TABS Take 1 tablet by mouth 2 (two) times daily.      . cholecalciferol (VITAMIN D) 1000 UNITS tablet Take 1,000 Units by mouth daily.      . clopidogrel (PLAVIX) 75 MG tablet Take 1 tablet (75 mg total) by mouth daily.  90 tablet  3  . fish oil-omega-3 fatty acids 1000 MG capsule Take 1-2 g by mouth 2 (two) times daily. Patient takes 1 tablet in the  morning and 2 tablets at bedtime      . Flaxseed, Linseed, (FLAX SEED OIL PO) Take 1 capsule by mouth 2 (two) times daily.      Marland Kitchen gemfibrozil (LOPID) 600 MG tablet TAKE 1 TABLET BY MOUTH TWICE A DAY BEFORE MEALS  180 tablet  0  . metFORMIN (GLUCOPHAGE) 500 MG tablet Take 1 tablet (500 mg total) by mouth 2 (two) times daily with a meal.  180 tablet  3  . metoprolol succinate (TOPROL-XL) 25 MG 24 hr tablet Take 1 tablet (25 mg total) by mouth daily.  90 tablet  3  . Multiple Vitamin (MULTIVITAMIN) tablet Take 0.5 tablets by mouth 2 (two) times daily.       . pantoprazole (PROTONIX) 40 MG tablet TAKE 1 TABLET BY MOUTH EVERY DAY ON AN EMPTY STOMACH  90 tablet  2  . potassium chloride SA (KLOR-CON M20) 20 MEQ tablet Take 1 tablet (20 mEq total) by mouth daily.  90 tablet  3  . pyridOXINE (VITAMIN B-6) 100 MG tablet Take 100 mg by mouth daily.      . nitrofurantoin, macrocrystal-monohydrate, (MACROBID) 100 MG capsule Take 1 capsule (100 mg total) by mouth 2 (two) times daily.  14 capsule  0  . solifenacin (VESICARE) 10 MG tablet Take 10 mg by mouth daily.       No current facility-administered medications on file prior to visit.    Review of Systems:  As per HPI- otherwise negative.   Physical Examination: Filed Vitals:   01/19/14 0955  BP: 133/62  Pulse: 59  Temp: 97.6 F (36.4 C)  Resp: 16   Filed Vitals:   01/19/14 0955  Height: 5\' 5"  (1.651 m)  Weight: 190 lb (86.183 kg)   Body mass index is 31.62 kg/(m^2). Ideal Body Weight: Weight in (lb) to have BMI = 25: 149.9  GEN: WDWN, NAD, Non-toxic, A & O x 3, overweight, looks well HEENT: Atraumatic, Normocephalic. Neck supple. No masses, No LAD. Bilateral TM wnl, oropharynx normal.  PEERL,EOMI.   She has a likely seborrheic keratosis in the right NL fold Ears and Nose: No external deformity. CV: RRR, No M/G/R. No JVD. No thrill. No extra heart sounds. PULM: CTA B, no wheezes, crackles, rhonchi. No retractions. No resp. distress. No  accessory muscle use. ABD: S, NT, ND. No  rebound. No HSM. EXTR: No c/c/e NEURO Normal gait.  PSYCH: Normally interactive. Conversant. Not depressed or anxious appearing.  Calm demeanor.  Feet: normal perfusion.  Thick calluses from walking barefoot  On exam I cannot appreciate any difference in strength between her arms. She has 5/5 strength bilaterally, and normal biceps DTR bilaterally. Normal and equal sensation bilaterally    Results for orders placed in visit on 01/19/14  POCT UA - MICROSCOPIC ONLY      Result Value Ref Range   WBC, Ur, HPF, POC tntc     RBC, urine, microscopic 4-6     Bacteria, U Microscopic 1+     Mucus, UA pos     Epithelial cells, urine per micros 4-5     Crystals, Ur, HPF, POC neg     Casts, Ur, LPF, POC neg     Yeast, UA neg    POCT URINALYSIS DIPSTICK      Result Value Ref Range   Color, UA yellow     Clarity, UA clear     Glucose, UA neg     Bilirubin, UA neg     Ketones, UA neg     Spec Grav, UA 1.020     Blood, UA neg     pH, UA 6.0     Protein, UA neg     Urobilinogen, UA 0.2     Nitrite, UA neg     Leukocytes, UA small (1+)    POCT GLYCOSYLATED HEMOGLOBIN (HGB A1C)      Result Value Ref Range   Hemoglobin A1C 7.4       Assessment and Plan: Dysuria - Plan: POCT UA - Microscopic Only, POCT urinalysis dipstick, Urine culture, cephALEXin (KEFLEX) 500 MG capsule  Type II or unspecified type diabetes mellitus without mention of complication, not stated as uncontrolled - Plan: HM Diabetes Foot Exam, CBC, Comprehensive metabolic panel, Lipid panel, POCT glycosylated hemoglobin (Hb A1C), Microalbumin, urine  Breast cancer  AAA (abdominal aortic aneurysm)  Cervical cancer  Peripheral neuropathy - Plan: gabapentin (NEURONTIN) 300 MG capsule  Skin lesion of face - Plan: Ambulatory referral to Dermatology  Likely UTI- await culture, treat with keflex DM is under acceptable control.  Await labs as above Referral to dermatology Possible  nerve impingement in neck causing left arm weakness which has occurred intermittently for one year.  Really cannot appreciate any difference in strength.  No numbness, no other neurological sx.  Will continue to follow  Signed Lamar Blinks, MD  Received her final urine culture and sensitivity.  We need to change her abx as she is resistant to keflex.  Will change to macrobid- her creatinine clearance is 77 so ok to use macrobid.  Her triglycerides and A1c have gone up over the last year.  She admits she has not been exercising as much as she did in the past.  Encouraged her to get back to her exercise program; she had been walking 2 miles a day.  Letter to pt as well

## 2014-01-19 NOTE — Patient Instructions (Signed)
Use the keflex twice a day for your urine- I will be in touch with your urine culture when it comes in.  I will let you know the results of your other labs as well.  Start on the gabapentin 300 mg- first once a day, then increase to twice a day and 3x if you can manage it! Let me know if this seems to be helping with your feet burning or not We will refer you to a dermatologist for your lesion on your face

## 2014-01-21 LAB — URINE CULTURE: Colony Count: 70000

## 2014-01-22 ENCOUNTER — Encounter: Payer: Self-pay | Admitting: Family Medicine

## 2014-01-22 ENCOUNTER — Telehealth: Payer: Self-pay

## 2014-01-22 MED ORDER — NITROFURANTOIN MONOHYD MACRO 100 MG PO CAPS: 100.0000 mg | ORAL_CAPSULE | Freq: Two times a day (BID) | ORAL | Status: DC

## 2014-01-22 NOTE — Addendum Note (Signed)
Addended by: Lamar Blinks C on: 01/22/2014 09:32 AM   Modules accepted: Orders

## 2014-02-14 ENCOUNTER — Other Ambulatory Visit: Payer: Self-pay | Admitting: Internal Medicine

## 2014-03-15 ENCOUNTER — Other Ambulatory Visit: Payer: Self-pay | Admitting: Internal Medicine

## 2014-03-15 DIAGNOSIS — I10 Essential (primary) hypertension: Secondary | ICD-10-CM

## 2014-03-16 NOTE — Telephone Encounter (Signed)
Dr Lorelei Pont, you saw pt in Feb, but don't see HTN addressed. Do you want to RF, or RTC?

## 2014-03-21 ENCOUNTER — Other Ambulatory Visit: Payer: Self-pay | Admitting: Internal Medicine

## 2014-03-21 DIAGNOSIS — Z9889 Other specified postprocedural states: Secondary | ICD-10-CM

## 2014-03-23 NOTE — Telephone Encounter (Signed)
Dr Lorelei Pont, do you want to RF pt's Plavix? Last Rxd by Dr Elder Cyphers but looks like she saw you recently to est care. Do we need to change her PCP?

## 2014-04-09 ENCOUNTER — Telehealth: Payer: Self-pay

## 2014-04-09 NOTE — Telephone Encounter (Signed)
Has not been seen for this issue since 01/2014.  Please advise.

## 2014-04-09 NOTE — Telephone Encounter (Signed)
Advised patient that she needs to come in to be checked for a UTI.  She stated that she knows she has a UTI.  Advised patient that she must be seen to get verification of this and for treatment.  She hung up the phone.

## 2014-04-09 NOTE — Telephone Encounter (Signed)
Patient says she see Dr. Lorelei Pont regularly for her UTI. She would like something called in to her pharmacy an antibiotic for her uti. I informed her her Dr. Lorelei Pont is off for several days and she may be advised to be seen. She would sill like someone to look at her records and send her something anyway's. Please advise patient. Thank you   Best: [phone]

## 2014-04-09 NOTE — Telephone Encounter (Signed)
Needs OV.  

## 2014-04-28 ENCOUNTER — Ambulatory Visit (INDEPENDENT_AMBULATORY_CARE_PROVIDER_SITE_OTHER): Payer: Medicare PPO | Admitting: Family Medicine

## 2014-04-28 VITALS — BP 138/62 | HR 65 | Temp 98.1°F | Resp 16 | Ht 65.0 in | Wt 187.2 lb

## 2014-04-28 DIAGNOSIS — R300 Dysuria: Secondary | ICD-10-CM

## 2014-04-28 DIAGNOSIS — N390 Urinary tract infection, site not specified: Secondary | ICD-10-CM

## 2014-04-28 LAB — POCT URINALYSIS DIPSTICK
Bilirubin, UA: NEGATIVE
GLUCOSE UA: 250
KETONES UA: NEGATIVE
Nitrite, UA: NEGATIVE
Protein, UA: 30
SPEC GRAV UA: 1.02
Urobilinogen, UA: 0.2
pH, UA: 6

## 2014-04-28 LAB — POCT UA - MICROSCOPIC ONLY
CASTS, UR, LPF, POC: NEGATIVE
Crystals, Ur, HPF, POC: NEGATIVE
EPITHELIAL CELLS, URINE PER MICROSCOPY: NEGATIVE
MUCUS UA: NEGATIVE
RBC, urine, microscopic: NEGATIVE
YEAST UA: NEGATIVE

## 2014-04-28 MED ORDER — NITROFURANTOIN MONOHYD MACRO 100 MG PO CAPS: 100.0000 mg | ORAL_CAPSULE | Freq: Two times a day (BID) | ORAL | Status: DC

## 2014-04-28 MED ORDER — PHENAZOPYRIDINE HCL 100 MG PO TABS: 100.0000 mg | ORAL_TABLET | Freq: Three times a day (TID) | ORAL | Status: DC | PRN

## 2014-04-28 NOTE — Patient Instructions (Signed)
Urinary Tract Infection  Urinary tract infections (UTIs) can develop anywhere along your urinary tract. Your urinary tract is your body's drainage system for removing wastes and extra water. Your urinary tract includes two kidneys, two ureters, a bladder, and a urethra. Your kidneys are a pair of bean-shaped organs. Each kidney is about the size of your fist. They are located below your ribs, one on each side of your spine.  CAUSES  Infections are caused by microbes, which are microscopic organisms, including fungi, viruses, and bacteria. These organisms are so small that they can only be seen through a microscope. Bacteria are the microbes that most commonly cause UTIs.  SYMPTOMS   Symptoms of UTIs may vary by age and gender of the patient and by the location of the infection. Symptoms in young women typically include a frequent and intense urge to urinate and a painful, burning feeling in the bladder or urethra during urination. Older women and men are more likely to be tired, shaky, and weak and have muscle aches and abdominal pain. A fever may mean the infection is in your kidneys. Other symptoms of a kidney infection include pain in your back or sides below the ribs, nausea, and vomiting.  DIAGNOSIS  To diagnose a UTI, your caregiver will ask you about your symptoms. Your caregiver also will ask to provide a urine sample. The urine sample will be tested for bacteria and white blood cells. White blood cells are made by your body to help fight infection.  TREATMENT   Typically, UTIs can be treated with medication. Because most UTIs are caused by a bacterial infection, they usually can be treated with the use of antibiotics. The choice of antibiotic and length of treatment depend on your symptoms and the type of bacteria causing your infection.  HOME CARE INSTRUCTIONS   If you were prescribed antibiotics, take them exactly as your caregiver instructs you. Finish the medication even if you feel better after you  have only taken some of the medication.   Drink enough water and fluids to keep your urine clear or pale yellow.   Avoid caffeine, tea, and carbonated beverages. They tend to irritate your bladder.   Empty your bladder often. Avoid holding urine for long periods of time.   Empty your bladder before and after sexual intercourse.   After a bowel movement, women should cleanse from front to back. Use each tissue only once.  SEEK MEDICAL CARE IF:    You have back pain.   You develop a fever.   Your symptoms do not begin to resolve within 3 days.  SEEK IMMEDIATE MEDICAL CARE IF:    You have severe back pain or lower abdominal pain.   You develop chills.   You have nausea or vomiting.   You have continued burning or discomfort with urination.  MAKE SURE YOU:    Understand these instructions.   Will watch your condition.   Will get help right away if you are not doing well or get worse.  Document Released: 08/30/2005 Document Revised: 05/21/2012 Document Reviewed: 12/29/2011  [employer] Patient Information 2014 [employer].

## 2014-04-28 NOTE — Progress Notes (Signed)
Chief Complaint:  Chief Complaint  Patient presents with  . Dysuria    HPI: Mandy Kim is a 68 y.o. female who is here for  UTI sxs x 2 days, she has not had any fevers or chills, has had some nausea. She has had frequency.  She has odor and pus in her urine. She has a history of bladder prolapse, she has had 2 bladder tacking procedures.  Sees Dr Kathyrn Lass with [facility] in [place], was on keflex for PPx  Past Medical History  Diagnosis Date  . Chronic kidney disease   . Hypertension   . Hyperlipidemia   . Diabetes mellitus   . Cancer   . Allergy   . GERD (gastroesophageal reflux disease)   . Cataract   . Diverticulitis   . Sleep apnea     has c pap machine   Past Surgical History  Procedure Laterality Date  . Abdominal aortic aneurysm repair    . Mastectomy    . Tonsillectomy    . Bladder tacking    . Cataract extraction, bilateral    . Abdominal aortic aneurysm repair    . Abdominal hysterectomy  2000  . Breast surgery    . Eye surgery     History   Social History  . Marital Status: Unknown    Spouse Name: N/A    Number of Children: N/A  . Years of Education: N/A   Social History Main Topics  . Smoking status: Former Smoker -- 30 years    Types: Cigarettes    Quit date: 06/07/1999  . Smokeless tobacco: None  . Alcohol Use: None  . Drug Use: None  . Sexual Activity: Yes   Other Topics Concern  . None   Social History Narrative   Lives with husband in [place].   Occupation: truck Geophysicist/field seismologist.   Family History  Problem Relation Age of Onset  . Cancer Mother   . Pneumonia Mother   . Thyroid disease Mother   . Thyroid disease Sister   . Obesity Daughter   . Hypertension Daughter   . Thyroid disease Son   . Pneumonia Maternal Grandmother   . Heart attack Maternal Grandfather    Allergies  Allergen Reactions  . Aspirin     Per pt any pain medications  . Crestor [Rosuvastatin Calcium]   . Lipitor [Atorvastatin Calcium]   .  Pravastatin   . Simvastatin    Prior to Admission medications   Medication Sig Start Date End Date Taking? Authorizing Provider  ACCU-CHEK AVIVA PLUS test strip USE AS DIRECTED 04/08/13  Yes Orma Flaming, MD  ACCU-CHEK FASTCLIX LANCETS MISC TEST TWICE A DAY 04/08/13  Yes Orma Flaming, MD  Biotin 1000 MCG tablet Take 1,000 mcg by mouth daily.   Yes Historical Provider, MD  calcium citrate-vitamin D 200-200 MG-UNIT TABS Take 1 tablet by mouth 2 (two) times daily.   Yes Historical Provider, MD  cephALEXin (KEFLEX) 500 MG capsule Take 1 capsule (500 mg total) by mouth 2 (two) times daily. 01/19/14  Yes Gay Filler Copland, MD  cholecalciferol (VITAMIN D) 1000 UNITS tablet Take 1,000 Units by mouth daily.   Yes Historical Provider, MD  clopidogrel (PLAVIX) 75 MG tablet TAKE 1 TABLET (75 MG TOTAL) BY MOUTH DAILY.   Yes Gay Filler Copland, MD  fish oil-omega-3 fatty acids 1000 MG capsule Take 1-2 g by mouth 2 (two) times daily. Patient takes 1 tablet in the morning and 2  tablets at bedtime   Yes Historical Provider, MD  Flaxseed, Linseed, (FLAX SEED OIL PO) Take 1 capsule by mouth 2 (two) times daily.   Yes Historical Provider, MD  gemfibrozil (LOPID) 600 MG tablet TAKE 1 TABLET (600 MG TOTAL) BY MOUTH 2 (TWO) TIMES DAILY BEFORE A MEAL.   Yes Gay Filler Copland, MD  metFORMIN (GLUCOPHAGE) 500 MG tablet TAKE 1 TABLET (500 MG TOTAL) BY MOUTH 2 (TWO) TIMES DAILY WITH A MEAL.   Yes Gay Filler Copland, MD  metoprolol succinate (TOPROL-XL) 25 MG 24 hr tablet TAKE 1 TABLET (25 MG TOTAL) BY MOUTH DAILY.   Yes Darreld Mclean, MD  Multiple Vitamin (MULTIVITAMIN) tablet Take 0.5 tablets by mouth 2 (two) times daily.    Yes Historical Provider, MD  pantoprazole (PROTONIX) 40 MG tablet TAKE 1 TABLET BY MOUTH EVERY DAY ON AN EMPTY STOMACH 09/25/13  Yes Orma Flaming, MD  potassium chloride SA (KLOR-CON M20) 20 MEQ tablet Take 1 tablet (20 mEq total) by mouth daily. 01/13/13  Yes Orma Flaming, MD  pyridOXINE (VITAMIN B-6)  100 MG tablet Take 100 mg by mouth daily.   Yes Historical Provider, MD     ROS: The patient denies fevers, chills, night sweats, unintentional weight loss, chest pain, palpitations, wheezing, dyspnea on exertion, abdominal pain,  hematuria, melena, numbness, weakness, or tingling.   All other systems have been reviewed and were otherwise negative with the exception of those mentioned in the HPI and as above.    PHYSICAL EXAM: Filed Vitals:   04/28/14 1214  BP: 138/62  Pulse: 65  Temp: 98.1 F (36.7 C)  Resp: 16   Filed Vitals:   04/28/14 1214  Height: 5\' 5"  (1.651 m)  Weight: 187 lb 3.2 oz (84.913 kg)   Body mass index is 31.15 kg/(m^2).  General: Alert, no acute distress HEENT:  Normocephalic, atraumatic, oropharynx patent. EOMI, PERRLA Cardiovascular:  Regular rate and rhythm, no rubs murmurs or gallops.  No Carotid bruits, radial pulse intact. No pedal edema.  Respiratory: Clear to auscultation bilaterally.  No wheezes, rales, or rhonchi.  No cyanosis, no use of accessory musculature GI: No organomegaly, abdomen is soft and non-tender, positive bowel sounds.  No masses. Skin: No rashes. Neurologic: Facial musculature symmetric. Psychiatric: Patient is appropriate throughout our interaction. Lymphatic: No cervical lymphadenopathy Musculoskeletal: Gait intact.   LABS: Results for orders placed in visit on 04/28/14  POCT UA - MICROSCOPIC ONLY      Result Value Ref Range   WBC, Ur, HPF, POC TNTC     RBC, urine, microscopic neg     Bacteria, U Microscopic 3+     Mucus, UA neg     Epithelial cells, urine per micros neg     Crystals, Ur, HPF, POC neg     Casts, Ur, LPF, POC neg     Yeast, UA neg    POCT URINALYSIS DIPSTICK      Result Value Ref Range   Color, UA yellow     Clarity, UA cloudy     Glucose, UA 250     Bilirubin, UA neg     Ketones, UA neg     Spec Grav, UA 1.020     Blood, UA small     pH, UA 6.0     Protein, UA 30     Urobilinogen, UA 0.2      Nitrite, UA neg     Leukocytes, UA large (3+)  EKG/XRAY:   Primary read interpreted by Dr. Marin Comment at [facility].   ASSESSMENT/PLAN: Encounter Diagnoses  Name Primary?  . Dysuria Yes  . UTI (urinary tract infection)    Urine cx pending Rx Macrobid and pyridium Kidney function was ok on labs Push fluids Ask Dr Kathyrn Lass if she needs to be on ppx for recurrent UTI due to ? Pelvic/bladder prolapse.  F/u prn  Gross sideeffects, risk and benefits, and alternatives of medications d/w patient. Patient is aware that all medications have potential sideeffects and we are unable to predict every sideeffect or drug-drug interaction that may occur.  Glenford Bayley, DO 04/28/2014 1:52 PM

## 2014-04-30 LAB — URINE CULTURE: Colony Count: >=100000

## 2014-05-23 ENCOUNTER — Ambulatory Visit (INDEPENDENT_AMBULATORY_CARE_PROVIDER_SITE_OTHER): Payer: Medicare PPO | Admitting: Emergency Medicine

## 2014-05-23 VITALS — BP 100/62 | HR 65 | Temp 98.0°F | Resp 18 | Ht 64.5 in | Wt 188.0 lb

## 2014-05-23 DIAGNOSIS — R319 Hematuria, unspecified: Secondary | ICD-10-CM

## 2014-05-23 DIAGNOSIS — J029 Acute pharyngitis, unspecified: Secondary | ICD-10-CM

## 2014-05-23 DIAGNOSIS — R300 Dysuria: Secondary | ICD-10-CM

## 2014-05-23 DIAGNOSIS — E119 Type 2 diabetes mellitus without complications: Secondary | ICD-10-CM

## 2014-05-23 DIAGNOSIS — R350 Frequency of micturition: Secondary | ICD-10-CM

## 2014-05-23 LAB — POCT CBC
GRANULOCYTE PERCENT: 41.9 % (ref 37–80)
HEMATOCRIT: 40.9 % (ref 37.7–47.9)
Hemoglobin: 13.2 g/dL (ref 12.2–16.2)
Lymph, poc: 2.4 (ref 0.6–3.4)
MCH: 28.9 pg (ref 27–31.2)
MCHC: 32.3 g/dL (ref 31.8–35.4)
MCV: 89.6 fL (ref 80–97)
MID (cbc): 0.5 (ref 0–0.9)
MPV: 8.9 fL (ref 0–99.8)
POC Granulocyte: 2.1 (ref 2–6.9)
POC LYMPH PERCENT: 48 %L (ref 10–50)
POC MID %: 10.1 %M (ref 0–12)
Platelet Count, POC: 275 10*3/uL (ref 142–424)
RBC: 4.56 M/uL (ref 4.04–5.48)
RDW, POC: 16.4 %
WBC: 5.1 10*3/uL (ref 4.6–10.2)

## 2014-05-23 LAB — POCT UA - MICROSCOPIC ONLY
Bacteria, U Microscopic: NEGATIVE
CASTS, UR, LPF, POC: NEGATIVE
Crystals, Ur, HPF, POC: NEGATIVE
EPITHELIAL CELLS, URINE PER MICROSCOPY: NEGATIVE
MUCUS UA: NEGATIVE
WBC, Ur, HPF, POC: NEGATIVE
Yeast, UA: NEGATIVE

## 2014-05-23 LAB — POCT URINALYSIS DIPSTICK
Bilirubin, UA: NEGATIVE
Blood, UA: NEGATIVE
Glucose, UA: NEGATIVE
KETONES UA: NEGATIVE
Nitrite, UA: NEGATIVE
PH UA: 5
Protein, UA: NEGATIVE
SPEC GRAV UA: 1.02
Urobilinogen, UA: 0.2

## 2014-05-23 LAB — GLUCOSE, POCT (MANUAL RESULT ENTRY): POC Glucose: 148 mg/dl — AB (ref 70–99)

## 2014-05-23 LAB — POCT RAPID STREP A (OFFICE): Rapid Strep A Screen: NEGATIVE

## 2014-05-23 MED ORDER — CEPHALEXIN 500 MG PO CAPS: 500.0000 mg | ORAL_CAPSULE | Freq: Two times a day (BID) | ORAL | Status: DC

## 2014-05-23 NOTE — Progress Notes (Signed)
Subjective:    Patient ID: Mandy Kim, female    DOB: Mar 03, 1946, 68 y.o.   MRN: [id number]  This chart was scribed for Mandy Queen, MD by Steva Colder, ED Scribe. The patient was seen in room 10 at 2:20 PM.   Chief Complaint  Patient presents with  . burning with urination    never really got better from 05/26  . Urinary Frequency    HPI   Mandy Kim is a 68 y.o. female who presents today complaining of painful urination onset 26 days ago. She states that she was here a couple of weeks ago and saw Dr. Truman Hayward who diagnosed her with an UTI. She states that she is having associated pain of back pain and sore throat. She states that her throat gets "real dry" and then she coughs.  She states that she can hardly stand or sit because of her back pain. She states that she was on Antibiotics for her UTI until last week when she finished the medication. She states that she feels burning and stinging while urinating. She states that she gets up during the night to urinate.    Past Medical History  Diagnosis Date  . Chronic kidney disease   . Hypertension   . Hyperlipidemia   . Diabetes mellitus   . Cancer   . Allergy   . GERD (gastroesophageal reflux disease)   . Cataract   . Diverticulitis   . Sleep apnea     has c pap machine   Scheduled Meds: Continuous Infusions: PRN Meds:.   Allergies  Allergen Reactions  . Aspirin     Per pt any pain medications  . Crestor [Rosuvastatin Calcium]   . Lipitor [Atorvastatin Calcium]   . Pravastatin   . Simvastatin      Review of Systems  Constitutional: Negative for fever and chills.  HENT: Positive for sore throat.   Respiratory: Positive for cough.   Gastrointestinal: Negative for nausea, vomiting, abdominal pain and diarrhea.  Genitourinary: Positive for dysuria.  Musculoskeletal: Positive for back pain.      BP 100/62  Pulse 65  Temp(Src) 98 F (36.7 C) (Oral)  Resp 18  Ht 5' 4.5" (1.638 m)  Wt 188 lb (85.276 kg)   BMI 31.78 kg/m2  SpO2 95%  Objective:   Physical Exam  Nursing note and vitals reviewed. Constitutional: She is oriented to person, place, and time. She appears well-developed and well-nourished. No distress.  HENT:  Head: Normocephalic and atraumatic.  Fever blister on the upper lip. Mild redness to the left side of the throat.  Eyes: EOM are normal.  Neck: Neck supple. No tracheal deviation present.  Cardiovascular: Normal rate.   Pulmonary/Chest: Effort normal and breath sounds normal. No respiratory distress.  Bilaterally mastectomy scars.   Abdominal: There is no tenderness.  Ventral hernia without masses.  Musculoskeletal: Normal range of motion.  Neurological: She is alert and oriented to person, place, and time.  Skin: Skin is warm and dry.  Psychiatric: She has a normal mood and affect. Her behavior is normal.    Results for orders placed in visit on 05/23/14  POCT URINALYSIS DIPSTICK      Result Value Ref Range   Color, UA yellow     Clarity, UA cloudy     Glucose, UA neg     Bilirubin, UA neg     Ketones, UA neg     Spec Grav, UA 1.020     Blood, UA neg  pH, UA 5.0     Protein, UA neg     Urobilinogen, UA 0.2     Nitrite, UA neg     Leukocytes, UA Trace    POCT UA - MICROSCOPIC ONLY      Result Value Ref Range   WBC, Ur, HPF, POC neg     RBC, urine, microscopic 10-15     Bacteria, U Microscopic neg     Mucus, UA neg     Epithelial cells, urine per micros neg     Crystals, Ur, HPF, POC neg     Casts, Ur, LPF, POC neg     Yeast, UA neg    POCT CBC      Result Value Ref Range   WBC 5.1  4.6 - 10.2 K/uL   Lymph, poc 2.4  0.6 - 3.4   POC LYMPH PERCENT 48.0  10 - 50 %L   MID (cbc) 0.5  0 - 0.9   POC MID % 10.1  0 - 12 %M   POC Granulocyte 2.1  2 - 6.9   Granulocyte percent 41.9  37 - 80 %G   RBC 4.56  4.04 - 5.48 M/uL   Hemoglobin 13.2  12.2 - 16.2 g/dL   HCT, POC 40.9  37.7 - 47.9 %   MCV 89.6  80 - 97 fL   MCH, POC 28.9  27 - 31.2 pg   MCHC 32.3   31.8 - 35.4 g/dL   RDW, POC 16.4     Platelet Count, POC 275  142 - 424 K/uL   MPV 8.9  0 - 99.8 fL  POCT RAPID STREP A (OFFICE)      Result Value Ref Range   Rapid Strep A Screen Negative  Negative  GLUCOSE, POCT (MANUAL RESULT ENTRY)      Result Value Ref Range   POC Glucose 148 (*) 70 - 99 mg/dl         Assessment & Plan:  She now has hematuria. This raises the question of a stone or some other abnormality of the urinary system. Urine culture was done we'll treat with cephalexin 500 twice a day and order CT of the abdomen stone protocol.

## 2014-05-23 NOTE — Patient Instructions (Signed)
Hematuria, Adult  Hematuria is blood in your urine. It can be caused by a bladder infection, kidney infection, prostate infection, kidney stone, or cancer of your urinary tract. Infections can usually be treated with medicine, and a kidney stone usually will pass through your urine. If neither of these is the cause of your hematuria, further workup to find out the reason may be needed.  It is very important that you tell your health care provider about any blood you see in your urine, even if the blood stops without treatment or happens without causing pain. Blood in your urine that happens and then stops and then happens again can be a symptom of a very serious condition. Also, pain is not a symptom in the initial stages of many urinary cancers.  HOME CARE INSTRUCTIONS   · Drink lots of fluid, 3-4 quarts a day. If you have been diagnosed with an infection, cranberry juice is especially recommended, in addition to large amounts of water.  · Avoid caffeine, tea, and carbonated beverages, because they tend to irritate the bladder.  · Avoid alcohol because it may irritate the prostate.  · Only take over-the-counter or prescription medicines for pain, discomfort, or fever as directed by your health care provider.  · If you have been diagnosed with a kidney stone, follow your health care provider's instructions regarding straining your urine to catch the stone.  · Empty your bladder often. Avoid holding urine for long periods of time.  · After a bowel movement, women should cleanse front to back. Use each tissue only once.  · Empty your bladder before and after sexual intercourse if you are a female.  SEEK MEDICAL CARE IF:  You develop back pain, fever, a feeling of sickness in your stomach (nausea), or vomiting or if your symptoms are not better in 3 days. Return sooner if you are getting worse.  SEEK IMMEDIATE MEDICAL CARE IF:   · You have a persistent fever, with a temperature of 101.8°F (38.8°C) or greater.  · You  develop severe vomiting and are unable to keep the medicine down.  · You develop severe back or abdominal pain despite taking your medicines.  · You begin passing a large amount of blood or clots in your urine.  · You feel extremely weak or faint, or you pass out.  MAKE SURE YOU:   · Understand these instructions.  · Will watch your condition.  · Will get help right away if you are not doing well or get worse.  Document Released: 11/20/2005 Document Revised: 09/10/2013 Document Reviewed: 07/21/2013  Exit[employer]® Patient Information ©2015 [employer]. This information is not intended to replace advice given to you by your health care provider. Make sure you discuss any questions you have with your health care provider.

## 2014-05-27 LAB — URINE CULTURE: Colony Count: >=100000

## 2014-06-20 ENCOUNTER — Other Ambulatory Visit: Payer: Self-pay | Admitting: Internal Medicine

## 2014-06-26 ENCOUNTER — Ambulatory Visit: Payer: Medicare PPO | Admitting: Family Medicine

## 2014-07-27 ENCOUNTER — Other Ambulatory Visit: Payer: Self-pay | Admitting: Physician Assistant

## 2014-07-27 ENCOUNTER — Ambulatory Visit (INDEPENDENT_AMBULATORY_CARE_PROVIDER_SITE_OTHER): Payer: Commercial Managed Care - HMO | Admitting: Family Medicine

## 2014-07-27 ENCOUNTER — Other Ambulatory Visit: Payer: Self-pay | Admitting: Family Medicine

## 2014-07-27 VITALS — BP 149/73 | HR 63 | Temp 98.0°F | Resp 16 | Ht 64.5 in | Wt 189.0 lb

## 2014-07-27 DIAGNOSIS — E785 Hyperlipidemia, unspecified: Secondary | ICD-10-CM

## 2014-07-27 DIAGNOSIS — Z23 Encounter for immunization: Secondary | ICD-10-CM

## 2014-07-27 DIAGNOSIS — E119 Type 2 diabetes mellitus without complications: Secondary | ICD-10-CM

## 2014-07-27 DIAGNOSIS — G629 Polyneuropathy, unspecified: Secondary | ICD-10-CM

## 2014-07-27 DIAGNOSIS — R7989 Other specified abnormal findings of blood chemistry: Secondary | ICD-10-CM

## 2014-07-27 DIAGNOSIS — R945 Abnormal results of liver function studies: Principal | ICD-10-CM

## 2014-07-27 DIAGNOSIS — G609 Hereditary and idiopathic neuropathy, unspecified: Secondary | ICD-10-CM

## 2014-07-27 DIAGNOSIS — K219 Gastro-esophageal reflux disease without esophagitis: Secondary | ICD-10-CM

## 2014-07-27 LAB — CBC WITH DIFFERENTIAL/PLATELET
Basophils Absolute: 0.1 10*3/uL (ref 0.0–0.1)
Basophils Relative: 1 % (ref 0–1)
Eosinophils Absolute: 0.3 10*3/uL (ref 0.0–0.7)
Eosinophils Relative: 5 % (ref 0–5)
HEMATOCRIT: 38.1 % (ref 36.0–46.0)
Hemoglobin: 13.1 g/dL (ref 12.0–15.0)
Lymphocytes Relative: 48 % — ABNORMAL HIGH (ref 12–46)
Lymphs Abs: 2.6 10*3/uL (ref 0.7–4.0)
MCH: 28.9 pg (ref 26.0–34.0)
MCHC: 34.4 g/dL (ref 30.0–36.0)
MCV: 83.9 fL (ref 78.0–100.0)
MONO ABS: 0.6 10*3/uL (ref 0.1–1.0)
MONOS PCT: 10 % (ref 3–12)
Neutro Abs: 2 10*3/uL (ref 1.7–7.7)
Neutrophils Relative %: 36 % — ABNORMAL LOW (ref 43–77)
Platelets: 262 10*3/uL (ref 150–400)
RBC: 4.54 MIL/uL (ref 3.87–5.11)
RDW: 15.1 % (ref 11.5–15.5)
WBC: 5.5 10*3/uL (ref 4.0–10.5)

## 2014-07-27 LAB — COMPREHENSIVE METABOLIC PANEL
ALT: 56 U/L — ABNORMAL HIGH (ref 0–35)
AST: 41 U/L — ABNORMAL HIGH (ref 0–37)
Albumin: 4.6 g/dL (ref 3.5–5.2)
Alkaline Phosphatase: 43 U/L (ref 39–117)
BUN: 14 mg/dL (ref 6–23)
CALCIUM: 9.1 mg/dL (ref 8.4–10.5)
CHLORIDE: 104 meq/L (ref 96–112)
CO2: 22 mEq/L (ref 19–32)
Creat: 0.72 mg/dL (ref 0.50–1.10)
Glucose, Bld: 158 mg/dL — ABNORMAL HIGH (ref 70–99)
Potassium: 4.5 mEq/L (ref 3.5–5.3)
Sodium: 137 mEq/L (ref 135–145)
Total Bilirubin: 0.4 mg/dL (ref 0.2–1.2)
Total Protein: 7.1 g/dL (ref 6.0–8.3)

## 2014-07-27 LAB — HEMOGLOBIN A1C
Hgb A1c MFr Bld: 8.3 % — ABNORMAL HIGH (ref <5.7)
Mean Plasma Glucose: 192 mg/dL — ABNORMAL HIGH (ref <117)

## 2014-07-27 LAB — LIPID PANEL
Cholesterol: 188 mg/dL (ref 0–200)
HDL: 27 mg/dL — AB (ref >39)
LDL Cholesterol: 89 mg/dL (ref 0–99)
TRIGLYCERIDES: 361 mg/dL — AB (ref <150)
Total CHOL/HDL Ratio: 7 Ratio
VLDL: 72 mg/dL — AB (ref 0–40)

## 2014-07-27 MED ORDER — AMITRIPTYLINE HCL 25 MG PO TABS: 25.0000 mg | ORAL_TABLET | Freq: Every day | ORAL | Status: DC

## 2014-07-27 NOTE — Progress Notes (Addendum)
[facility] [address], [place] [address] [phone]  Date:  07/27/2014   Name:  Mandy Kim   DOB:  17-Jan-1946   MRN:  [id number]  PCP:  Kennon Portela, MD    Chief Complaint: Follow-up   History of Present Illness:  Mandy Kim is a 68 y.o. very pleasant female patient who presents with the following:  Here today for follow-up.  Most recent visit with me was about 6 months ago.    Lab Results  Component Value Date   HGBA1C 7.4 01/19/2014   She was dx with a bladder prolapse, and is seeing Dr. Louis Meckel with [facility].  She is taking macrobid once a day every day, will follow-up next week.    She is fasting today.   Declines flu shot today  She had cervical cancer and a hysterectomy in 2000- they did paps for a few years and then told her she could DC this.   We had tried some neurontin a few months ago for her feet.  However this seemed to make her worse- she took it for a couple of weeks.  She would like to try something else if possible.    Patient Active Problem List   Diagnosis Date Noted  . Other and unspecified hyperlipidemia 02/12/2012  . AAA (abdominal aortic aneurysm) 02/12/2012  . Fatty liver 02/12/2012  . Breast cancer 02/12/2012  . Cervical cancer 02/12/2012  . Diabetes mellitus 02/12/2012  . Hyperlipidemia 02/12/2012    Past Medical History  Diagnosis Date  . Chronic kidney disease   . Hypertension   . Hyperlipidemia   . Diabetes mellitus   . Cancer   . Allergy   . GERD (gastroesophageal reflux disease)   . Cataract   . Diverticulitis   . Sleep apnea     has c pap machine    Past Surgical History  Procedure Laterality Date  . Abdominal aortic aneurysm repair    . Mastectomy    . Tonsillectomy    . Bladder tacking    . Cataract extraction, bilateral    . Abdominal aortic aneurysm repair    . Abdominal hysterectomy  2000  . Breast surgery    . Eye surgery      History  Substance Use Topics  .  Smoking status: Former Smoker -- 30 years    Types: Cigarettes    Quit date: 06/07/1999  . Smokeless tobacco: Not on file  . Alcohol Use: Not on file    Family History  Problem Relation Age of Onset  . Cancer Mother   . Pneumonia Mother   . Thyroid disease Mother   . Thyroid disease Sister   . Obesity Daughter   . Hypertension Daughter   . Thyroid disease Son   . Pneumonia Maternal Grandmother   . Heart attack Maternal Grandfather     Allergies  Allergen Reactions  . Aspirin     Per pt any pain medications  . Crestor [Rosuvastatin Calcium]   . Lipitor [Atorvastatin Calcium]   . Pravastatin   . Simvastatin     Medication list has been reviewed and updated.  Current Outpatient Prescriptions on File Prior to Visit  Medication Sig Dispense Refill  . ACCU-CHEK AVIVA PLUS test strip USE AS DIRECTED  100 each  2  . ACCU-CHEK FASTCLIX LANCETS MISC TEST TWICE A DAY  102 each  2  . Biotin 1000 MCG tablet Take 1,000 mcg by mouth daily.      Marland Kitchen  calcium citrate-vitamin D 200-200 MG-UNIT TABS Take 1 tablet by mouth 2 (two) times daily.      . cholecalciferol (VITAMIN D) 1000 UNITS tablet Take 1,000 Units by mouth daily.      . clopidogrel (PLAVIX) 75 MG tablet TAKE 1 TABLET (75 MG TOTAL) BY MOUTH DAILY.  90 tablet  3  . fish oil-omega-3 fatty acids 1000 MG capsule Take 1-2 g by mouth 2 (two) times daily. Patient takes 1 tablet in the morning and 2 tablets at bedtime      . gemfibrozil (LOPID) 600 MG tablet TAKE 1 TABLET (600 MG TOTAL) BY MOUTH 2 (TWO) TIMES DAILY BEFORE A MEAL.  180 tablet  1  . metFORMIN (GLUCOPHAGE) 500 MG tablet TAKE 1 TABLET (500 MG TOTAL) BY MOUTH 2 (TWO) TIMES DAILY WITH A MEAL.  180 tablet  1  . metoprolol succinate (TOPROL-XL) 25 MG 24 hr tablet TAKE 1 TABLET (25 MG TOTAL) BY MOUTH DAILY.  90 tablet  1  . Multiple Vitamin (MULTIVITAMIN) tablet Take 0.5 tablets by mouth 2 (two) times daily.       . pantoprazole (PROTONIX) 40 MG tablet TAKE 1 TABLET BY MOUTH EVERY  DAY ON AN EMPTY STOMACH  30 tablet  0  . potassium chloride SA (KLOR-CON M20) 20 MEQ tablet Take 1 tablet (20 mEq total) by mouth daily.  90 tablet  3  . pyridOXINE (VITAMIN B-6) 100 MG tablet Take 100 mg by mouth daily.       No current facility-administered medications on file prior to visit.    Review of Systems:  As per HPI- otherwise negative.   Physical Examination: Filed Vitals:   07/27/14 0920  BP: 149/73  Pulse: 63  Temp: 98 F (36.7 C)  Resp: 16   Filed Vitals:   07/27/14 0920  Height: 5' 4.5" (1.638 m)  Weight: 189 lb (85.73 kg)   Body mass index is 31.95 kg/(m^2). Ideal Body Weight: Weight in (lb) to have BMI = 25: 147.6  GEN: WDWN, NAD, Non-toxic, A & O x 3, overweight, looks well HEENT: Atraumatic, Normocephalic. Neck supple. No masses, No LAD.  Bilateral TM wnl, oropharynx normal.  PEERL,EOMI.   Ears and Nose: No external deformity. CV: RRR, No M/G/R. No JVD. No thrill. No extra heart sounds. S/p double mastectomy PULM: CTA B, no wheezes, crackles, rhonchi. No retractions. No resp. distress. No accessory muscle use. ABD: S, NT, ND, +BS. No rebound. No HSM. EXTR: No c/c/e NEURO Normal gait.  PSYCH: Normally interactive. Conversant. Not depressed or anxious appearing.  Calm demeanor.  Foot exam done today  Assessment and Plan: Type II or unspecified type diabetes mellitus without mention of complication, not stated as uncontrolled - Plan: HM Diabetes Foot Exam, Comprehensive metabolic panel, CBC with Differential, Hemoglobin A1c, Pneumococcal conjugate vaccine 13-valent IM  Immunization due  Other and unspecified hyperlipidemia - Plan: Lipid panel  Peripheral neuropathy - Plan: amitriptyline (ELAVIL) 25 MG tablet   Given prevnar today, refused flu shot.  Await her labs and will follow-up with her.   Will try elavil 65mfor her peripheral neuropathy  See patient instructions for more details.   Plan follow- up in about 6 months  Did records release so  we can try and get her colonoscopy report   Signed JLamar Blinks MD  8/25: called to discuss with her.   Results for orders placed in visit on 07/27/14  COMPREHENSIVE METABOLIC PANEL      Result Value Ref Range  Sodium 137  135 - 145 mEq/L   Potassium 4.5  3.5 - 5.3 mEq/L   Chloride 104  96 - 112 mEq/L   CO2 22  19 - 32 mEq/L   Glucose, Bld 158 (*) 70 - 99 mg/dL   BUN 14  6 - 23 mg/dL   Creat 0.72  0.50 - 1.10 mg/dL   Total Bilirubin 0.4  0.2 - 1.2 mg/dL   Alkaline Phosphatase 43  39 - 117 U/L   AST 41 (*) 0 - 37 U/L   ALT 56 (*) 0 - 35 U/L   Total Protein 7.1  6.0 - 8.3 g/dL   Albumin 4.6  3.5 - 5.2 g/dL   Calcium 9.1  8.4 - 10.5 mg/dL  CBC WITH DIFFERENTIAL      Result Value Ref Range   WBC 5.5  4.0 - 10.5 K/uL   RBC 4.54  3.87 - 5.11 MIL/uL   Hemoglobin 13.1  12.0 - 15.0 g/dL   HCT 38.1  36.0 - 46.0 %   MCV 83.9  78.0 - 100.0 fL   MCH 28.9  26.0 - 34.0 pg   MCHC 34.4  30.0 - 36.0 g/dL   RDW 15.1  11.5 - 15.5 %   Platelets 262  150 - 400 K/uL   Neutrophils Relative % 36 (*) 43 - 77 %   Neutro Abs 2.0  1.7 - 7.7 K/uL   Lymphocytes Relative 48 (*) 12 - 46 %   Lymphs Abs 2.6  0.7 - 4.0 K/uL   Monocytes Relative 10  3 - 12 %   Monocytes Absolute 0.6  0.1 - 1.0 K/uL   Eosinophils Relative 5  0 - 5 %   Eosinophils Absolute 0.3  0.0 - 0.7 K/uL   Basophils Relative 1  0 - 1 %   Basophils Absolute 0.1  0.0 - 0.1 K/uL   Smear Review Criteria for review not met    LIPID PANEL      Result Value Ref Range   Cholesterol 188  0 - 200 mg/dL   Triglycerides 361 (*) <150 mg/dL   HDL 27 (*) >39 mg/dL   Total CHOL/HDL Ratio 7.0     VLDL 72 (*) 0 - 40 mg/dL   LDL Cholesterol 89  0 - 99 mg/dL  HEMOGLOBIN A1C      Result Value Ref Range   Hemoglobin A1C 8.3 (*) <5.7 %   Mean Plasma Glucose 192 (*) <117 mg/dL   Her A1c has gone up.  She admits to eating a lot of carbs lately and will work on this.  Her triglycerides are also up.  She is already on gemfibrozil and her LFTs are  up.  Hesitate to add a statin.  She declines a hepatic ultrasound now due to cost.  Plan to recheck in 4 months and she is going to work on her diet.

## 2014-07-27 NOTE — Patient Instructions (Addendum)
We are going to try a low dose of amitriptyline for your foot pain.  Let me know if this is or is not helpful!  Otherwise I will be in touch with your labs asap.  Take care!    Pneumococcal Polysaccharide Vaccine: What You Need to Know 1. Pneumococcal disease Pneumococcal disease is caused by Streptococcus pneumoniae bacteria. It is a leading cause of vaccine-preventable illness and death in the [place]. Anyone can get pneumococcal disease, but some people are at greater risk than others:  People 47 years and older  The very young  People with certain health problems  People with a weakened immune system  Smokers Pneumococcal disease can lead to serious infections of the:  Lungs (pneumonia),  Blood (bacteremia), and  Covering of the brain (meningitis). Pneumococcal pneumonia kills about 1 out of 20 people who get it. Bacteremia kills about 1 person in 5, and meningitis about 3 people in 10.  People with the health problems described in Section 3 of this statement may be more likely to die from the disease. 2. Pneumococcal polysaccharide vaccine (PPSV) Treatment of pneumococcal infections with penicillin and other drugs used to be more effective. But some strains of the disease have become resistant to these drugs. This makes prevention of the disease, through vaccination, even more important. Pneumococcal polysaccharide vaccine (PPSV) protects against 23 types of pneumococcal bacteria, including those most likely to cause serious disease. Most healthy adults who get the vaccine develop protection to most or all of these types within 2 to 3 weeks of getting the shot. Very old people, children under 26 years of age, and people with some long-term illnesses might not respond as well, or at all. Another type of pneumococcal vaccine (pneumococcal conjugate vaccine, or PCV) is routinely recommended for children younger than 60 years of age. PCV is described in a separate Vaccine  Information Statement. 3. Who should get PPSV?  All adults 55 years of age and older.  Anyone 2 through 70 years of age who has a long-term health problem such as:  heart disease  lung disease  sickle cell disease  diabetes  alcoholism  cirrhosis  leaks of cerebrospinal fluid or cochlear implant  Anyone 2 through 70 years of age who has a disease or condition that lowers the body's resistance to infection, such as:  Hodgkin's disease  lymphoma or leukemia  kidney failure  multiple myeloma  nephrotic syndrome  HIV infection or AIDS  damaged spleen, or no spleen  organ transplant  Anyone 2 through 70 years of age who is taking a drug or treatment that lowers the body's resistance to infection, such as:  long-term steroids  certain cancer drugs  radiation therapy  Any adult 53 through 70 years of age who:  is a smoker  has asthma PPSV may be less effective for some people, especially those with lower resistance to infection. But these people should still be vaccinated, because they are more likely to have serious complications if they get pneumococcal disease. Children who often get ear infections, sinus infections, or other upper respiratory diseases, but who are otherwise healthy, do not need to get PPSV because it is not effective against those conditions. 4. How many doses of PPSV are needed, and when? Usually only one dose of PPSV is needed, but under some circumstances a second dose may be given.  A second dose is recommended for people 65 years and older who got their first dose when they were younger than  65 and it has been 5 or more years since the first dose.  A second dose is recommended for people 2 through 70 years of age who:  have a damaged spleen or no spleen  have sickle-cell disease  have HIV infection or AIDS  have cancer, leukemia, lymphoma, multiple myeloma  have nephrotic syndrome  have had an organ or bone marrow  transplant  are taking medication that lowers immunity (such as chemotherapy or long-term steroids) When a second dose is given, it should be given 5 years after the first dose. 5. Some people should not get PPSV or should wait  Anyone who has had a life-threatening allergic reaction to PPSV should not get another dose.  Anyone who has a severe allergy to any component of a vaccine should not get that vaccine. Tell your doctor if you have any severe allergies.  Anyone who is moderately or severely ill when the shot is scheduled may be asked to wait until they recover before getting the vaccine. Someone with a mild illness can usually be vaccinated.  While there is no evidence that PPSV is harmful to either a pregnant woman or to her fetus, as a precaution, women with conditions that put them at risk for pneumococcal disease should be vaccinated before becoming pregnant, if possible. 6. What are the risks from PPSV? About half of people who get PPSV have mild side effects, such as redness or pain where the shot is given. Less than 1% develop a fever, muscle aches, or more severe local reactions. A vaccine, like any medicine, could cause a serious reaction. But the risk of a vaccine causing serious harm, or death, is extremely small. 7. What if there is a serious reaction? What should I look for?  Look for anything that concerns you, such as signs of a severe allergic reaction, very high fever, or behavior changes. Signs of a severe allergic reaction can include hives, swelling of the face and throat, difficulty breathing, a fast heartbeat, dizziness, and weakness. These would start a few minutes to a few hours after the vaccination. What should I do?  If you think it is a severe allergic reaction or other emergency that can't wait, call 9-1-1 or get the person to the nearest hospital. Otherwise, call your doctor.  Afterward, the reaction should be reported to the Vaccine Adverse Event  Reporting System (VAERS). Your doctor might file this report, or you can do it yourself through the VAERS web site at www.vaers.SamedayNews.es, or by calling 580 205 6686. VAERS is only for reporting reactions. They do not give medical advice. 8. How can I learn more?  Ask your doctor.  Call your local or state health department.  Contact the Centers for Disease Control and Prevention (CDC):  Call 9544736204 (1-800-CDC-INFO) or  Visit CDC's website at http://hunter.com/ CDC Pneumococcal Polysaccharide Vaccine VIS (09/08/08) Document Released: 09/17/2006 Document Revised: 04/06/2014 Document Reviewed: 03/30/2014 [facility] Patient Information 2015 [place], [place]. This information is not intended to replace advice given to you by your health care provider. Make sure you discuss any questions you have with your health care provider.

## 2014-07-27 NOTE — Telephone Encounter (Signed)
Dr Lorelei Pont, is it [facility] to give RFs? I don't see this med discussed at [place] today.

## 2014-07-28 ENCOUNTER — Encounter: Payer: Self-pay | Admitting: Family Medicine

## 2014-07-29 LAB — HEPATITIS PANEL, ACUTE

## 2014-07-30 ENCOUNTER — Telehealth: Payer: Self-pay | Admitting: *Deleted

## 2014-07-30 NOTE — Addendum Note (Signed)
Addended by: Darreld Mclean on: 07/30/2014 06:49 AM   Modules accepted: Orders

## 2014-07-30 NOTE — Telephone Encounter (Signed)
[facility] called and stated that they did not have enough specimen to do the add on for acute hep panel.  Would you like the pt to return for redraw?

## 2014-07-30 NOTE — Telephone Encounter (Signed)
I will contact pt...thanks

## 2014-08-03 ENCOUNTER — Telehealth: Payer: Self-pay | Admitting: Family Medicine

## 2014-08-03 NOTE — Telephone Encounter (Signed)
Patient is looking for a return call from Dr Lorelei Pont. States that she has a concern about a medication  [phone]

## 2014-08-04 NOTE — Telephone Encounter (Signed)
Pt states that she has not been able to get her amitriptyline due to PA needed. Needs prior auth due to caution in elderly.  Called pharmacy to get fax for PA started.

## 2014-08-05 NOTE — Telephone Encounter (Signed)
Still have not received info from pharm. Called pharmacy and they are sending info since we did not get it yesterday. Completed on covermymeds and PA was approved, case # [id number]. Notified pharm and pt.

## 2014-08-06 ENCOUNTER — Telehealth: Payer: Self-pay

## 2014-08-06 NOTE — Telephone Encounter (Signed)
This PA was approved yesterday w/case number on prev ph mess. LMOM for pharm giving them case # and advised they should call pharm help desk to find out what the problem is. Notified pt that there is nothing more we can do since it has already been approved and pharm should be able to get help from help desk.

## 2014-08-06 NOTE — Telephone Encounter (Signed)
Patient does says although PA was approved insurance still will not accept from pharmacy.

## 2014-08-17 ENCOUNTER — Other Ambulatory Visit: Payer: Self-pay | Admitting: Family Medicine

## 2014-09-09 ENCOUNTER — Other Ambulatory Visit: Payer: Self-pay | Admitting: Family Medicine

## 2014-10-31 ENCOUNTER — Other Ambulatory Visit: Payer: Self-pay | Admitting: Internal Medicine

## 2014-10-31 ENCOUNTER — Telehealth: Payer: Self-pay

## 2014-10-31 DIAGNOSIS — Z8541 Personal history of malignant neoplasm of cervix uteri: Secondary | ICD-10-CM

## 2014-10-31 DIAGNOSIS — I10 Essential (primary) hypertension: Secondary | ICD-10-CM

## 2014-10-31 DIAGNOSIS — I714 Abdominal aortic aneurysm, without rupture, unspecified: Secondary | ICD-10-CM

## 2014-10-31 DIAGNOSIS — Z853 Personal history of malignant neoplasm of breast: Secondary | ICD-10-CM

## 2014-10-31 DIAGNOSIS — E7800 Pure hypercholesterolemia, unspecified: Secondary | ICD-10-CM

## 2014-10-31 DIAGNOSIS — Z8679 Personal history of other diseases of the circulatory system: Secondary | ICD-10-CM

## 2014-10-31 DIAGNOSIS — Z0000 Encounter for general adult medical examination without abnormal findings: Secondary | ICD-10-CM

## 2014-10-31 NOTE — Telephone Encounter (Signed)
Pt called in wanting a refill on her potassium chloride SA (KLOR-CON M20) 20 MEQ tablet meds, stated [facility] uses [facility] [address].  Her call back number is [phone]

## 2014-10-31 NOTE — Telephone Encounter (Signed)
Patient calling back in regards to her prescription for her potasium chloride. Per patient she is completely out and can not go without this medicine. Please call in as soon as possible to [facility]. Patients call back number is [phone]

## 2014-11-02 MED ORDER — POTASSIUM CHLORIDE CRYS ER 20 MEQ PO TBCR: 20.0000 meq | EXTENDED_RELEASE_TABLET | Freq: Every day | ORAL | Status: DC

## 2014-11-02 NOTE — Telephone Encounter (Signed)
Sent in for her. Called her to advise. No answer/ no recording

## 2014-11-10 ENCOUNTER — Telehealth: Payer: Self-pay | Admitting: Family Medicine

## 2014-11-10 NOTE — Telephone Encounter (Signed)
Spoke with patient and she does not take a flu shot

## 2014-11-11 ENCOUNTER — Ambulatory Visit (INDEPENDENT_AMBULATORY_CARE_PROVIDER_SITE_OTHER): Payer: Medicare PPO | Admitting: Emergency Medicine

## 2014-11-11 VITALS — BP 128/82 | HR 78 | Temp 98.6°F | Resp 18 | Ht 65.0 in | Wt 189.0 lb

## 2014-11-11 DIAGNOSIS — L237 Allergic contact dermatitis due to plants, except food: Secondary | ICD-10-CM

## 2014-11-11 MED ORDER — HYDROXYZINE HCL 25 MG PO TABS: 12.5000 mg | ORAL_TABLET | Freq: Three times a day (TID) | ORAL | Status: DC | PRN

## 2014-11-11 MED ORDER — METHYLPREDNISOLONE ACETATE 80 MG/ML IJ SUSP: 120.0000 mg | Freq: Once | INTRAMUSCULAR | Status: DC

## 2014-11-11 NOTE — Patient Instructions (Signed)

## 2014-11-11 NOTE — Progress Notes (Signed)
[facility] [address] [place] [address] [phone]  Date:  11/11/2014   Name:  Mandy Kim   DOB:  [birth year]   MRN:  [id number]  PCP:  Kennon Portela, MD    Chief Complaint: Allergic Reaction   History of Present Illness:  Mandy Kim is a 68 y.o. very pleasant female patient who presents with the following:  Worked in the yard on Monday and developed swelling and redness and itching of the face and left ear that day. Took one zyrtec with no improvement. No difficulty swallowing. No wheezing or shortness of breath. No fever or chills.  No cough or coryza  No new personal care products or medications No improvement with over the counter medications or other home remedies.  Denies other complaint or health concern today.   Patient Active Problem List   Diagnosis Date Noted  . Peripheral neuropathy 07/27/2014  . Other and unspecified hyperlipidemia 02/12/2012  . AAA (abdominal aortic aneurysm) 02/12/2012  . Fatty liver 02/12/2012  . Breast cancer 02/12/2012  . Cervical cancer 02/12/2012  . Diabetes mellitus 02/12/2012  . Hyperlipidemia 02/12/2012    Past Medical History  Diagnosis Date  . Chronic kidney disease   . Hypertension   . Hyperlipidemia   . Diabetes mellitus   . Cancer   . Allergy   . GERD (gastroesophageal reflux disease)   . Cataract   . Diverticulitis   . Sleep apnea     has c pap machine    Past Surgical History  Procedure Laterality Date  . Abdominal aortic aneurysm repair    . Mastectomy    . Tonsillectomy    . Bladder tacking    . Cataract extraction, bilateral    . Abdominal aortic aneurysm repair    . Abdominal hysterectomy  2000  . Breast surgery    . Eye surgery      History  Substance Use Topics  . Smoking status: Former Smoker -- 30 years    Types: Cigarettes    Quit date: 06/07/1999  . Smokeless tobacco: Not on file  . Alcohol Use: Not on file    Family History  Problem Relation  Age of Onset  . Cancer Mother   . Pneumonia Mother   . Thyroid disease Mother   . Thyroid disease Sister   . Obesity Daughter   . Hypertension Daughter   . Thyroid disease Son   . Pneumonia Maternal Grandmother   . Heart attack Maternal Grandfather     Allergies  Allergen Reactions  . Aspirin     Per pt any pain medications  . Crestor [Rosuvastatin Calcium]   . Lipitor [Atorvastatin Calcium]   . Pravastatin   . Simvastatin     Medication list has been reviewed and updated.  Current Outpatient Prescriptions on File Prior to Visit  Medication Sig Dispense Refill  . ACCU-CHEK AVIVA PLUS test strip USE AS DIRECTED 100 each 2  . ACCU-CHEK FASTCLIX LANCETS MISC TEST TWICE A DAY 102 each 2  . amitriptyline (ELAVIL) 25 MG tablet Take 1 tablet (25 mg total) by mouth at bedtime. 30 tablet 5  . Biotin 1000 MCG tablet Take 1,000 mcg by mouth daily.    . calcium citrate-vitamin D 200-200 MG-UNIT TABS Take 1 tablet by mouth 2 (two) times daily.    . cholecalciferol (VITAMIN D) 1000 UNITS tablet Take 1,000 Units by mouth daily.    . clopidogrel (PLAVIX) 75 MG tablet TAKE 1 TABLET (  75 MG TOTAL) BY MOUTH DAILY. 90 tablet 3  . fish oil-omega-3 fatty acids 1000 MG capsule Take 1-2 g by mouth 2 (two) times daily. Patient takes 1 tablet in the morning and 2 tablets at bedtime    . gemfibrozil (LOPID) 600 MG tablet TAKE 1 TABLET (600 MG TOTAL) BY MOUTH 2 (TWO) TIMES DAILY BEFORE A MEAL. 180 tablet 1  . metFORMIN (GLUCOPHAGE) 500 MG tablet TAKE 1 TABLET (500 MG TOTAL) BY MOUTH 2 (TWO) TIMES DAILY WITH A MEAL. 180 tablet 1  . metoprolol succinate (TOPROL-XL) 25 MG 24 hr tablet TAKE 1 TABLET (25 MG TOTAL) BY MOUTH DAILY. 90 tablet 1  . Multiple Vitamin (MULTIVITAMIN) tablet Take 0.5 tablets by mouth 2 (two) times daily.     . pantoprazole (PROTONIX) 40 MG tablet TAKE 1 TABLET BY MOUTH EVERY DAY ON AN EMPTY STOMACH 90 tablet 2  . potassium chloride SA (KLOR-CON M20) 20 MEQ tablet Take 1 tablet (20 mEq  total) by mouth daily. Due for visit in Dec. 2015 90 tablet 0  . pyridOXINE (VITAMIN B-6) 100 MG tablet Take 100 mg by mouth daily.     No current facility-administered medications on file prior to visit.    Review of Systems:  As per HPI, otherwise negative.    Physical Examination: Filed Vitals:   11/11/14 1326  BP: 128/82  Pulse: 78  Temp: 98.6 F (37 C)  Resp: 18   Filed Vitals:   11/11/14 1326  Height: 5\' 5"  (1.651 m)  Weight: 189 lb (85.73 kg)   Body mass index is 31.45 kg/(m^2). Ideal Body Weight: Weight in (lb) to have BMI = 25: 149.9   GEN: WDWN, NAD, Non-toxic, Alert & Oriented x 3 HEENT: Atraumatic, Normocephalic.  Ears and Nose: No external deformity. EXTR: No clubbing/cyanosis/edema NEURO: Normal gait.  PSYCH: Normally interactive. Conversant. Not depressed or anxious appearing.  Calm demeanor.  Skin:  Contact dermatitis characteristic to poison ivy  Assessment and Plan: Contact dermatitis Vistaril  Depo medrol  Signed,  Ellison Carwin, MD

## 2014-12-21 ENCOUNTER — Encounter: Payer: Self-pay | Admitting: Family Medicine

## 2014-12-21 ENCOUNTER — Ambulatory Visit (INDEPENDENT_AMBULATORY_CARE_PROVIDER_SITE_OTHER): Payer: Commercial Managed Care - HMO | Admitting: Family Medicine

## 2014-12-21 VITALS — BP 140/70 | HR 61 | Temp 97.6°F | Resp 16 | Ht 65.0 in | Wt 179.0 lb

## 2014-12-21 DIAGNOSIS — E7800 Pure hypercholesterolemia, unspecified: Secondary | ICD-10-CM

## 2014-12-21 DIAGNOSIS — I10 Essential (primary) hypertension: Secondary | ICD-10-CM

## 2014-12-21 DIAGNOSIS — E118 Type 2 diabetes mellitus with unspecified complications: Secondary | ICD-10-CM

## 2014-12-21 DIAGNOSIS — G629 Polyneuropathy, unspecified: Secondary | ICD-10-CM

## 2014-12-21 DIAGNOSIS — Z0000 Encounter for general adult medical examination without abnormal findings: Secondary | ICD-10-CM | POA: Diagnosis not present

## 2014-12-21 LAB — COMPREHENSIVE METABOLIC PANEL
ALBUMIN: 4.6 g/dL (ref 3.5–5.2)
ALK PHOS: 53 U/L (ref 39–117)
ALT: 83 U/L — ABNORMAL HIGH (ref 0–35)
AST: 56 U/L — AB (ref 0–37)
BUN: 14 mg/dL (ref 6–23)
CO2: 22 mEq/L (ref 19–32)
Calcium: 9.8 mg/dL (ref 8.4–10.5)
Chloride: 97 mEq/L (ref 96–112)
Creat: 0.75 mg/dL (ref 0.50–1.10)
Glucose, Bld: 322 mg/dL — ABNORMAL HIGH (ref 70–99)
POTASSIUM: 4.1 meq/L (ref 3.5–5.3)
SODIUM: 133 meq/L — AB (ref 135–145)
Total Bilirubin: 0.7 mg/dL (ref 0.2–1.2)
Total Protein: 7.6 g/dL (ref 6.0–8.3)

## 2014-12-21 LAB — LIPID PANEL
CHOLESTEROL: 232 mg/dL — AB (ref 0–200)
HDL: 31 mg/dL — AB (ref >39)
TRIGLYCERIDES: 706 mg/dL — AB (ref <150)
Total CHOL/HDL Ratio: 7.5 Ratio

## 2014-12-21 LAB — MICROALBUMIN, URINE: Microalb, Ur: 2 mg/dL (ref <2.0)

## 2014-12-21 LAB — HEMOGLOBIN A1C
Hgb A1c MFr Bld: 12.1 % — ABNORMAL HIGH (ref <5.7)
Mean Plasma Glucose: 301 mg/dL — ABNORMAL HIGH (ref <117)

## 2014-12-21 MED ORDER — AMITRIPTYLINE HCL 25 MG PO TABS: 25.0000 mg | ORAL_TABLET | Freq: Every day | ORAL | Status: DC

## 2014-12-21 MED ORDER — METOPROLOL SUCCINATE ER 25 MG PO TB24: ORAL_TABLET | ORAL | Status: DC

## 2014-12-21 MED ORDER — POTASSIUM CHLORIDE CRYS ER 20 MEQ PO TBCR: 20.0000 meq | EXTENDED_RELEASE_TABLET | Freq: Every day | ORAL | Status: DC

## 2014-12-21 MED ORDER — GEMFIBROZIL 600 MG PO TABS: ORAL_TABLET | ORAL | Status: DC

## 2014-12-21 MED ORDER — METFORMIN HCL 500 MG PO TABS: ORAL_TABLET | ORAL | Status: DC

## 2014-12-21 NOTE — Progress Notes (Addendum)
[facility] [address], [place] [address] [phone]  Date:  12/21/2014   Name:  Mandy Kim   DOB:  1946/11/11   MRN:  [id number]  PCP:  Kennon Portela, MD    Chief Complaint: Follow-up   History of Present Illness:  Mandy Kim is a 69 y.o. very pleasant female patient who presents with the following:  Here today to follow-up.  Last seen by myself in August- we need to follow-up elevated A1c, elevated LFts and dyslipidemia.  She is running low on her potassium replacement as well as some other meds.  Elavil is helping her feet not burn.   She lost her glucose meter- I will rx a new one for her She has lost 10 lbs-attributes this to taking a tablespoon of vinegar once a day and also she is eating less.   She declines a flu shot today.  She has had prevnar and penumovax.  Wt Readings from Last 3 Encounters:  12/21/14 179 lb (81.194 kg)  11/11/14 189 lb (85.73 kg)  07/27/14 189 lb (85.73 kg)   Lab Results  Component Value Date   HGBA1C 8.3* 07/27/2014   Patient Active Problem List   Diagnosis Date Noted  . Peripheral neuropathy 07/27/2014  . Other and unspecified hyperlipidemia 02/12/2012  . AAA (abdominal aortic aneurysm) 02/12/2012  . Fatty liver 02/12/2012  . Breast cancer 02/12/2012  . Cervical cancer 02/12/2012  . Diabetes mellitus 02/12/2012  . Hyperlipidemia 02/12/2012    Past Medical History  Diagnosis Date  . Chronic kidney disease   . Hypertension   . Hyperlipidemia   . Diabetes mellitus   . Cancer   . Allergy   . GERD (gastroesophageal reflux disease)   . Cataract   . Diverticulitis   . Sleep apnea     has c pap machine    Past Surgical History  Procedure Laterality Date  . Abdominal aortic aneurysm repair    . Mastectomy    . Tonsillectomy    . Bladder tacking    . Cataract extraction, bilateral    . Abdominal aortic aneurysm repair    . Abdominal hysterectomy  2000  . Breast surgery    . Eye surgery       History  Substance Use Topics  . Smoking status: Former Smoker -- 30 years    Types: Cigarettes    Quit date: 06/07/1999  . Smokeless tobacco: Not on file  . Alcohol Use: Not on file    Family History  Problem Relation Age of Onset  . Cancer Mother   . Pneumonia Mother   . Thyroid disease Mother   . Thyroid disease Sister   . Obesity Daughter   . Hypertension Daughter   . Thyroid disease Son   . Pneumonia Maternal Grandmother   . Heart attack Maternal Grandfather     Allergies  Allergen Reactions  . Aspirin     Per pt any pain medications  . Crestor [Rosuvastatin Calcium]   . Lipitor [Atorvastatin Calcium]   . Pravastatin   . Simvastatin     Medication list has been reviewed and updated.  Current Outpatient Prescriptions on File Prior to Visit  Medication Sig Dispense Refill  . ACCU-CHEK AVIVA PLUS test strip USE AS DIRECTED 100 each 2  . ACCU-CHEK FASTCLIX LANCETS MISC TEST TWICE A DAY 102 each 2  . amitriptyline (ELAVIL) 25 MG tablet Take 1 tablet (25 mg total) by mouth at bedtime. 30 tablet  5  . Biotin 1000 MCG tablet Take 1,000 mcg by mouth daily.    . calcium citrate-vitamin D 200-200 MG-UNIT TABS Take 1 tablet by mouth 2 (two) times daily.    . cholecalciferol (VITAMIN D) 1000 UNITS tablet Take 1,000 Units by mouth daily.    . clopidogrel (PLAVIX) 75 MG tablet TAKE 1 TABLET (75 MG TOTAL) BY MOUTH DAILY. 90 tablet 3  . fish oil-omega-3 fatty acids 1000 MG capsule Take 1-2 g by mouth 2 (two) times daily. Patient takes 1 tablet in the morning and 2 tablets at bedtime    . gemfibrozil (LOPID) 600 MG tablet TAKE 1 TABLET (600 MG TOTAL) BY MOUTH 2 (TWO) TIMES DAILY BEFORE A MEAL. 180 tablet 1  . hydrOXYzine (ATARAX/VISTARIL) 25 MG tablet Take 0.5-1 tablets (12.5-25 mg total) by mouth 3 (three) times daily as needed for itching. 30 tablet 0  . metFORMIN (GLUCOPHAGE) 500 MG tablet TAKE 1 TABLET (500 MG TOTAL) BY MOUTH 2 (TWO) TIMES DAILY WITH A MEAL. 180 tablet 1   . metoprolol succinate (TOPROL-XL) 25 MG 24 hr tablet TAKE 1 TABLET (25 MG TOTAL) BY MOUTH DAILY. 90 tablet 1  . Multiple Vitamin (MULTIVITAMIN) tablet Take 0.5 tablets by mouth 2 (two) times daily.     . pantoprazole (PROTONIX) 40 MG tablet TAKE 1 TABLET BY MOUTH EVERY DAY ON AN EMPTY STOMACH 90 tablet 2  . potassium chloride SA (KLOR-CON M20) 20 MEQ tablet Take 1 tablet (20 mEq total) by mouth daily. Due for visit in Dec. 2015 90 tablet 0  . pyridOXINE (VITAMIN B-6) 100 MG tablet Take 100 mg by mouth daily.     Current Facility-Administered Medications on File Prior to Visit  Medication Dose Route Frequency Provider Last Rate Last Dose  . methylPREDNISolone acetate (DEPO-MEDROL) injection 120 mg  120 mg Intramuscular Once Roselee Culver, MD        Review of Systems:  As per HPI- otherwise negative.   Physical Examination: Filed Vitals:   12/21/14 1015  BP: 140/70  Pulse: 61  Temp: 97.6 F (36.4 C)  Resp: 16   Filed Vitals:   12/21/14 1015  Height: 5\' 5"  (1.651 m)  Weight: 179 lb (81.194 kg)   Body mass index is 29.79 kg/(m^2). Ideal Body Weight: Weight in (lb) to have BMI = 25: 149.9  GEN: WDWN, NAD, Non-toxic, A & O x 3, overweight but has lost and looks well HEENT: Atraumatic, Normocephalic. Neck supple. No masses, No LAD. Bilateral TM wnl, oropharynx normal.  PEERL,EOMI.   Ears and Nose: No external deformity. CV: RRR, No M/G/R. No JVD. No thrill. No extra heart sounds. PULM: CTA B, no wheezes, crackles, rhonchi. No retractions. No resp. distress. No accessory muscle use. EXTR: No c/c/e NEURO Normal gait.   Foot exam today wnl PSYCH: Normally interactive. Conversant. Not depressed or anxious appearing.  Calm demeanor.    Assessment and Plan: Routine general medical examination at a health care facility  Essential hypertension, benign - Plan: potassium chloride SA (KLOR-CON M20) 20 MEQ tablet, metoprolol succinate (TOPROL-XL) 25 MG 24 hr tablet, Comprehensive  metabolic panel  Pure hypercholesterolemia - Plan: gemfibrozil (LOPID) 600 MG tablet, Lipid panel  Peripheral neuropathy - Plan: amitriptyline (ELAVIL) 25 MG tablet  Diabetes mellitus type 2 with complications - Plan: metFORMIN (GLUCOPHAGE) 500 MG tablet, Microalbumin, urine, Hemoglobin A1c  BP is ok, continue current medications Await FLP- she is on lopid and has been intolerant to statins Hope to see improvement in her  A1c since she has lost weight. Plan follow-up with her pending labs, recheck in clinic in about 3 months   Signed Lamar Blinks, MD  1/19: received her labs as below. Called her to discuss- her A1c unfortunately has gone up, I do not think we will be able to reach goal with oral therapy alone.  Suggested that we start lantus but she does not wish to do this.  She has heard about Trulicity, a once a week GLP-1 agonist that she would like to try.  This is a reasonable plan, barring cost issues.  Will send in rx for her.  She has "given plenty of injections" and feels comfortable with the injection pens but will come in for instruction if she cannot discern how to use the pen. Continue using metformin as well.  Planned for a recheck in clinic in 2 months.   Results for orders placed or performed in visit on 12/21/14  Comprehensive metabolic panel  Result Value Ref Range   Sodium 133 (L) 135 - 145 mEq/L   Potassium 4.1 3.5 - 5.3 mEq/L   Chloride 97 96 - 112 mEq/L   CO2 22 19 - 32 mEq/L   Glucose, Bld 322 (H) 70 - 99 mg/dL   BUN 14 6 - 23 mg/dL   Creat 0.75 0.50 - 1.10 mg/dL   Total Bilirubin 0.7 0.2 - 1.2 mg/dL   Alkaline Phosphatase 53 39 - 117 U/L   AST 56 (H) 0 - 37 U/L   ALT 83 (H) 0 - 35 U/L   Total Protein 7.6 6.0 - 8.3 g/dL   Albumin 4.6 3.5 - 5.2 g/dL   Calcium 9.8 8.4 - 10.5 mg/dL  Lipid panel  Result Value Ref Range   Cholesterol 232 (H) 0 - 200 mg/dL   Triglycerides 706 (H) <150 mg/dL   HDL 31 (L) >39 mg/dL   Total CHOL/HDL Ratio 7.5 Ratio   VLDL NOT  CALC 0 - 40 mg/dL   LDL Cholesterol NOT CALC 0 - 99 mg/dL  Microalbumin, urine  Result Value Ref Range   Microalb, Ur 2.0 <2.0 mg/dL  Hemoglobin A1c  Result Value Ref Range   Hgb A1c MFr Bld 12.1 (H) <5.7 %   Mean Plasma Glucose 301 (H) <117 mg/dL

## 2014-12-21 NOTE — Patient Instructions (Signed)
Good to see you today- I will be in touch with your labs.  Take care and let's plan to recheck in 3 months unless your labs suggest otherwise

## 2014-12-22 MED ORDER — DULAGLUTIDE 0.75 MG/0.5ML ~~LOC~~ SOAJ: SUBCUTANEOUS | Status: DC

## 2014-12-22 NOTE — Addendum Note (Signed)
Addended by: Lamar Blinks C on: 12/22/2014 04:07 PM   Modules accepted: Orders

## 2014-12-23 ENCOUNTER — Telehealth: Payer: Self-pay

## 2014-12-28 ENCOUNTER — Encounter: Payer: Self-pay | Admitting: Family Medicine

## 2015-01-04 ENCOUNTER — Telehealth: Payer: Self-pay

## 2015-01-04 DIAGNOSIS — R7989 Other specified abnormal findings of blood chemistry: Secondary | ICD-10-CM

## 2015-01-04 DIAGNOSIS — R945 Abnormal results of liver function studies: Principal | ICD-10-CM

## 2015-01-04 NOTE — Telephone Encounter (Signed)
Called her back- I will order her [place].  She has noted some belching and diarrhea with trulicity- she will try this for a few more weeks and let me know if not improved

## 2015-01-04 NOTE — Telephone Encounter (Signed)
Pt is is thinking that a referral for a ultrasound should be in the works from dr copland

## 2015-01-04 NOTE — Telephone Encounter (Signed)
Spoke with pt, she is referring to an ultrasound of her liver. Please advise.

## 2015-01-12 ENCOUNTER — Ambulatory Visit
Admission: RE | Admit: 2015-01-12 | Discharge: 2015-01-12 | Disposition: A | Discharge status: Home / Self Care | Payer: Commercial Managed Care - HMO | Source: Ambulatory Visit | Attending: Family Medicine | Admitting: Family Medicine

## 2015-01-12 DIAGNOSIS — I714 Abdominal aortic aneurysm, without rupture: Secondary | ICD-10-CM | POA: Diagnosis not present

## 2015-01-12 DIAGNOSIS — R7989 Other specified abnormal findings of blood chemistry: Secondary | ICD-10-CM

## 2015-01-12 DIAGNOSIS — R945 Abnormal results of liver function studies: Principal | ICD-10-CM

## 2015-01-12 DIAGNOSIS — R748 Abnormal levels of other serum enzymes: Secondary | ICD-10-CM | POA: Diagnosis not present

## 2015-01-12 IMAGING — US US ABDOMEN COMPLETE
1 series · 13 of 25 positions shown · non-contrast
Comparison: CT chest and upper abdomen [DATE]

CLINICAL DATA: Elevated liver enzymes

EXAM:
ULTRASOUND ABDOMEN COMPLETE

[Series 1: us abdomen complete · 0.21mm/px · 13 of 85 slices shown]
[im 1/85]
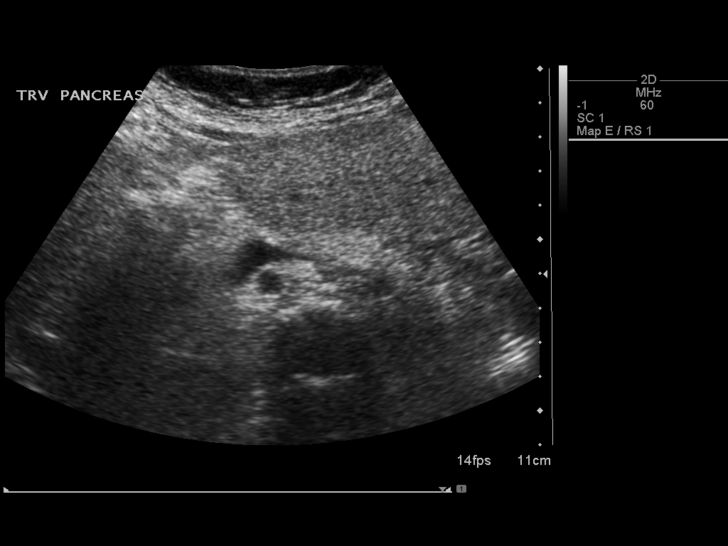
[im 8/85]
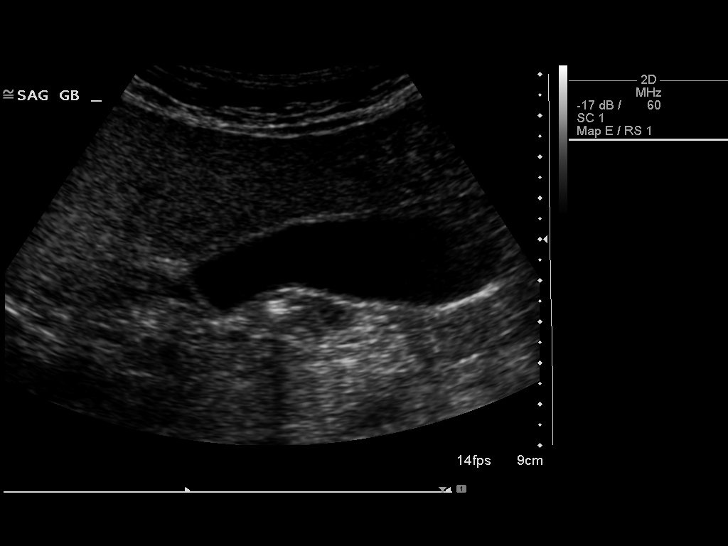
[im 15/85]
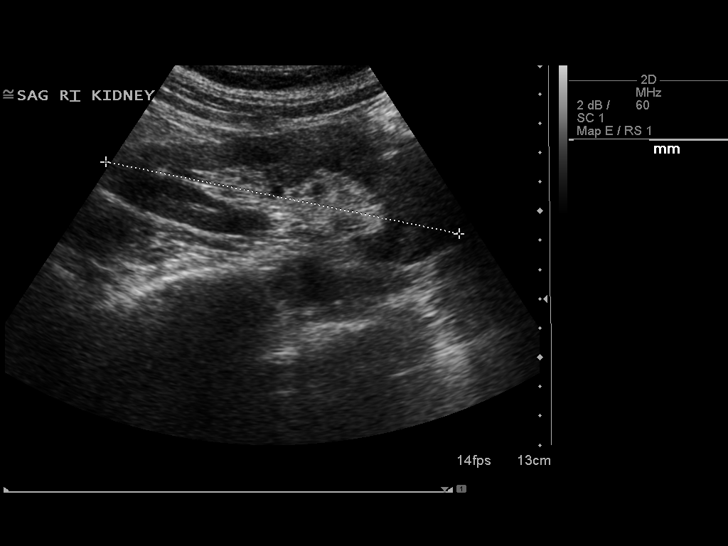
[im 22/85]
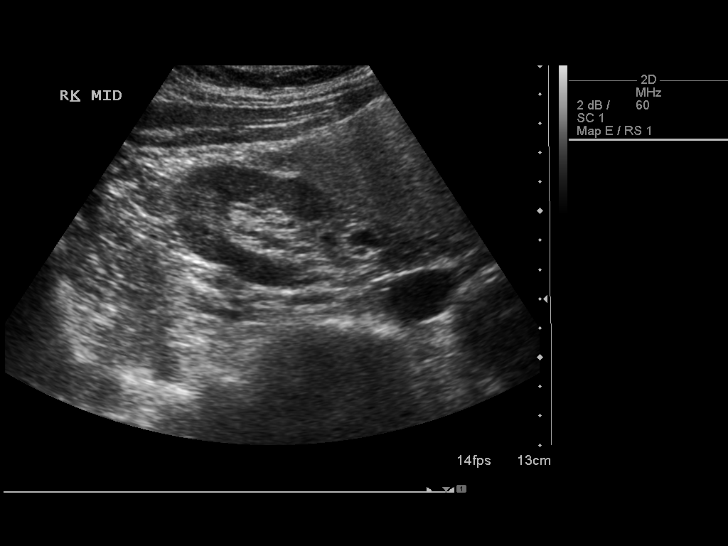
[im 29/85]
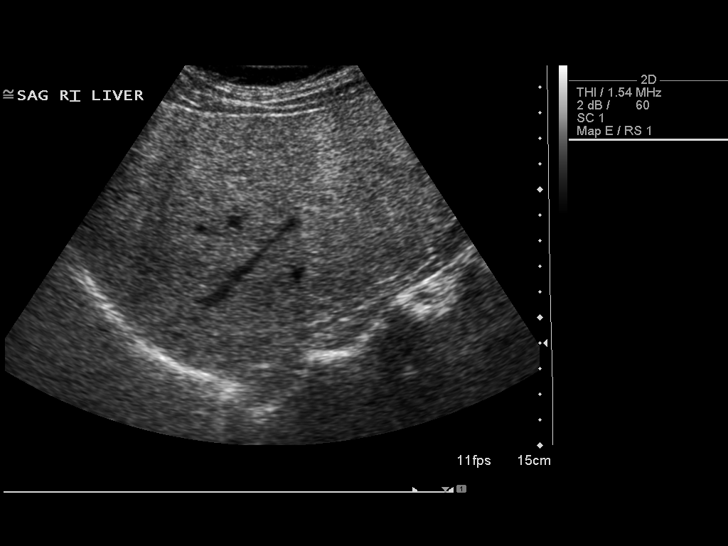
[im 36/85]
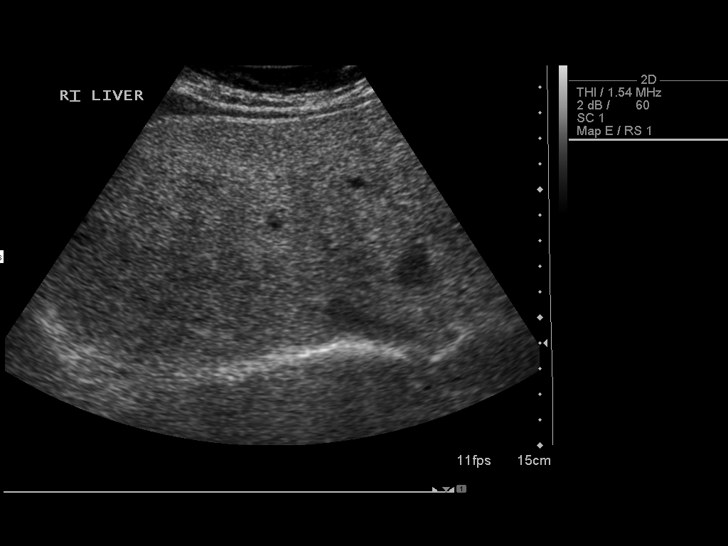
[im 43/85]
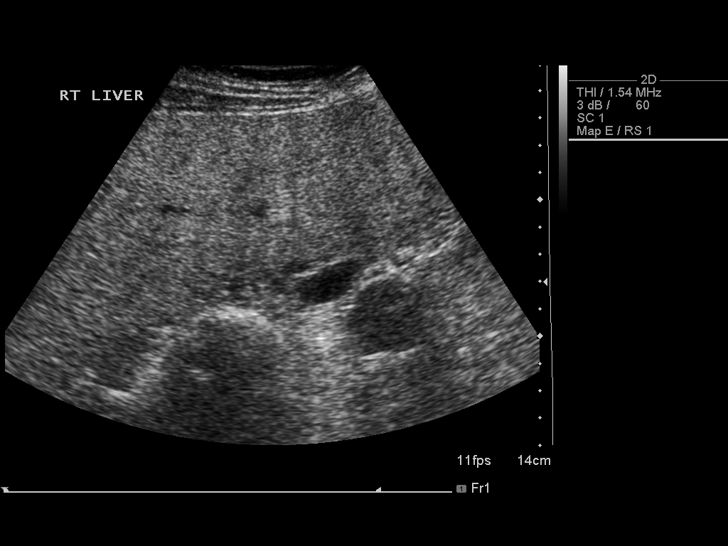
[im 50/85]
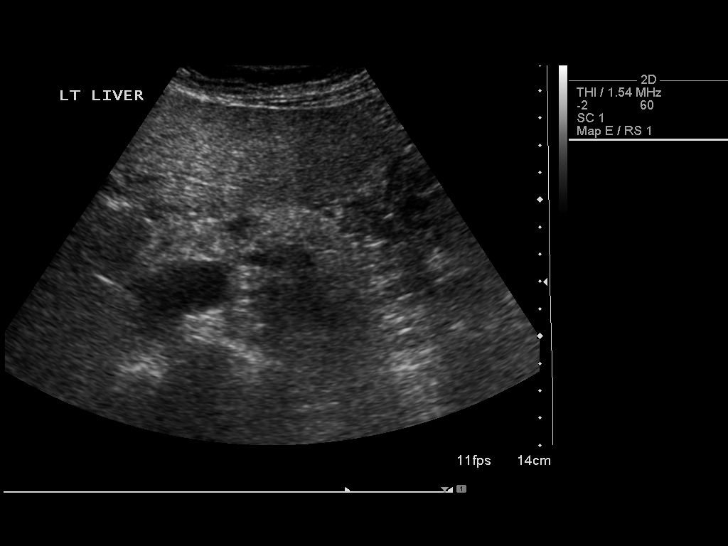
[im 57/85]
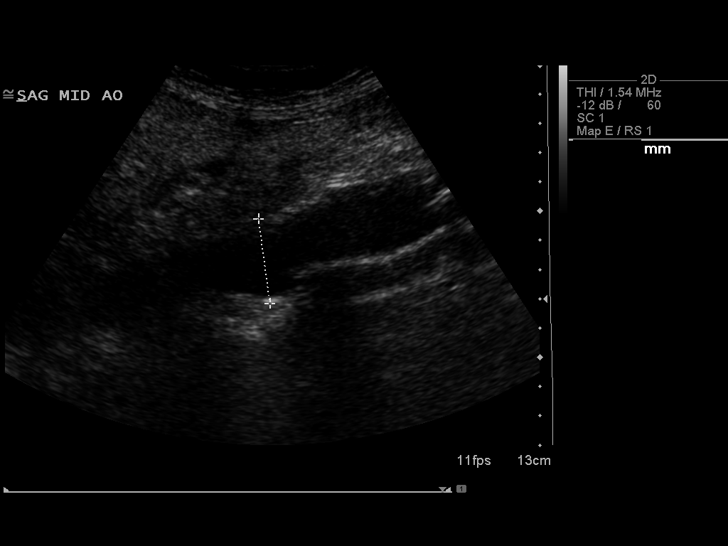
[im 64/85]
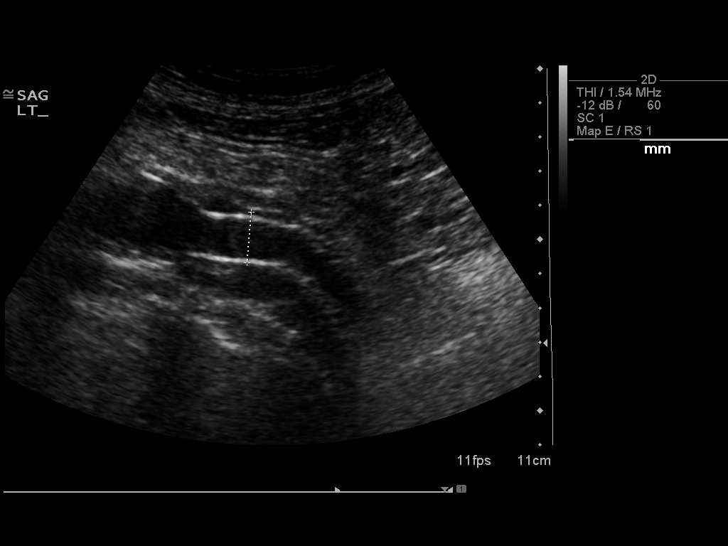
[im 71/85]
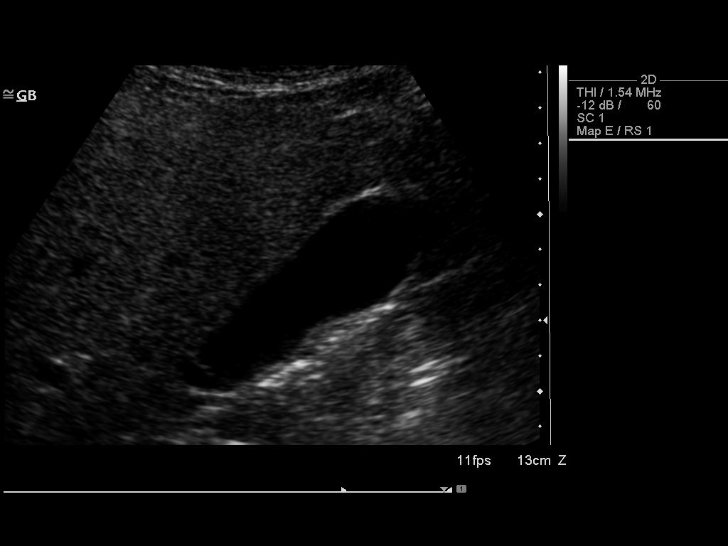
[im 78/85]
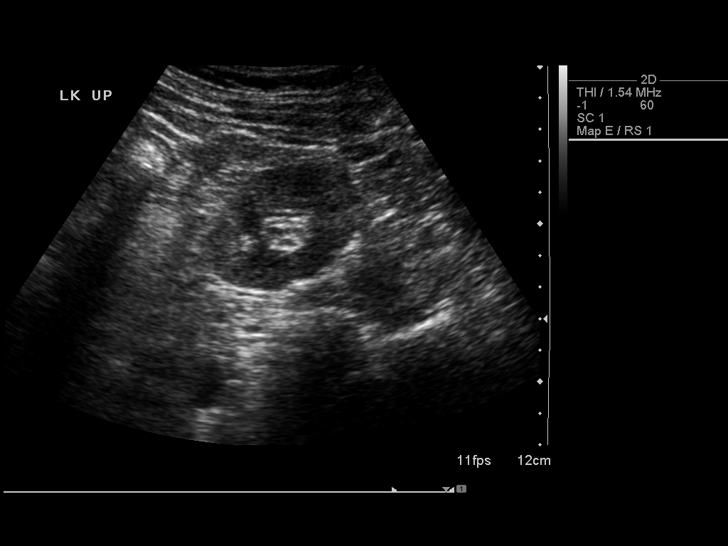
[im 85/85]
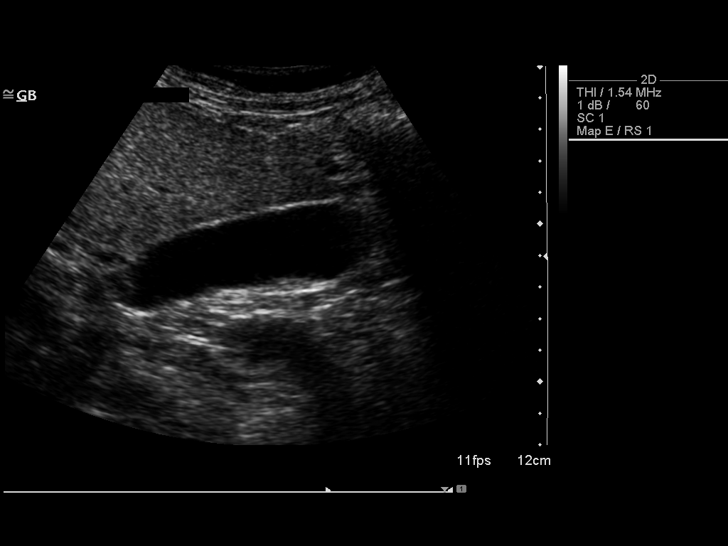

[13 of 25 positions shown; findings below may reference images not displayed]

FINDINGS: Gallbladder: No gallstones or wall thickening visualized. There is
no pericholecystic fluid. No sonographic Murphy sign noted.

Common bile duct: Diameter: 3 mm. There is no intrahepatic, common
hepatic, or common bile duct dilatation.

Liver: No focal lesion identified. Liver echogenicity is overall
increased and somewhat inhomogeneous.

IVC: No abnormality visualized.

Pancreas: Visualized portion unremarkable. Portions of pancreas are
obscured by gas.

Spleen: Size and appearance within normal limits.

Right Kidney: Length: 12.3 cm. Echogenicity within normal limits. No
mass or hydronephrosis visualized.

Left Kidney: Length: 12.4 cm. Echogenicity within normal limits. No
mass or hydronephrosis visualized.

Abdominal aorta: An aortic stent graft is present. The distal
abdominal aorta measures 4.2 x 3.7 cm. There is no periaortic fluid.

Other findings: No demonstrable ascites.
IMPRESSION: Abdominal aortic aneurysm with aortic stent graft in place.

Liver echogenicity is increased and somewhat inhomogeneous. This
finding is most likely due to hepatic steatosis. While no focal
liver lesions are identified, it must be cautioned that the
sensitivity of ultrasound for focal liver lesions is diminished in
this circumstance.

Portions of pancreas obscured by gas. Visualized portions of
pancreas appear normal.

Study otherwise unremarkable.

## 2015-01-16 ENCOUNTER — Encounter: Payer: Self-pay | Admitting: Family Medicine

## 2015-01-21 ENCOUNTER — Telehealth: Payer: Self-pay

## 2015-01-21 NOTE — Telephone Encounter (Signed)
Appt on 4/18. Can we refill.

## 2015-01-21 NOTE — Telephone Encounter (Signed)
Pt is scheduled to see dr copland on 03/22/15, but wants to know if her her medication for trulicity can be refilled

## 2015-01-21 NOTE — Telephone Encounter (Signed)
Patient is calling for Dr. Lorelei Pont. Patient would like KLOR-CON M20  renewed  for a 1 year, 3 months at a time. Patient phone [phone]

## 2015-01-22 NOTE — Telephone Encounter (Signed)
Contacted pt and advised that pharm should have RFs of this from 12/22/14. Pt advised she will check with them, she thought the pharm would automatically fill them and call her, if they had it on file. I explained that she should call pharm and asked to be set up on their automated RF list if she would like refills that way.

## 2015-01-22 NOTE — Telephone Encounter (Signed)
Advised pt she has RFs already at pharmacy from 12/21/14.

## 2015-02-09 ENCOUNTER — Telehealth: Payer: Self-pay

## 2015-02-09 DIAGNOSIS — E1165 Type 2 diabetes mellitus with hyperglycemia: Secondary | ICD-10-CM

## 2015-02-09 DIAGNOSIS — IMO0002 Reserved for concepts with insufficient information to code with codable children: Secondary | ICD-10-CM

## 2015-02-09 NOTE — Telephone Encounter (Signed)
Pt states that the Dulaglutide (TRULICITY) 0.13 HY/3.8OI SOPN [[id number]] is making her sick, and that she has had diarrhea for the past month. Pt would like to know if Dr. Lorelei Pont can switch her medication to something else that wont be so hard on her. Please advise

## 2015-02-10 MED ORDER — INSULIN PEN NEEDLE 31G X 6 MM MISC: Status: DC

## 2015-02-10 MED ORDER — INSULIN GLARGINE 100 UNIT/ML SOLOSTAR PEN: PEN_INJECTOR | SUBCUTANEOUS | Status: DC

## 2015-02-10 NOTE — Telephone Encounter (Signed)
Called her back- she states that trulicity has caused gas, diarrhea and nausea. She has tried to use it for about 6 weeks and these sx have not gotten better.  She would like to stop this and is ok with going on insulin.  Discussed the lantus pen and she feels that she will be able to use this.  However asked her to bring her pen to clinic if she has any questions and I am glad to go over it with her. Asked her to start at 10 units and go up by 2 units every 2 days as long as FBG is over 150. She has an appt to see me on 3/18.she will let me know if/ when she gets to 20 units of lantus   This morning her glucose was 180

## 2015-03-15 ENCOUNTER — Encounter: Payer: Self-pay | Admitting: Family Medicine

## 2015-03-15 ENCOUNTER — Ambulatory Visit (INDEPENDENT_AMBULATORY_CARE_PROVIDER_SITE_OTHER): Payer: Commercial Managed Care - HMO | Admitting: Family Medicine

## 2015-03-15 VITALS — BP 128/66 | HR 53 | Temp 97.7°F | Resp 16 | Ht 64.75 in | Wt 178.8 lb

## 2015-03-15 DIAGNOSIS — E118 Type 2 diabetes mellitus with unspecified complications: Secondary | ICD-10-CM | POA: Diagnosis not present

## 2015-03-15 DIAGNOSIS — E1165 Type 2 diabetes mellitus with hyperglycemia: Secondary | ICD-10-CM | POA: Diagnosis not present

## 2015-03-15 DIAGNOSIS — N390 Urinary tract infection, site not specified: Secondary | ICD-10-CM | POA: Diagnosis not present

## 2015-03-15 DIAGNOSIS — Z13820 Encounter for screening for osteoporosis: Secondary | ICD-10-CM

## 2015-03-15 DIAGNOSIS — E785 Hyperlipidemia, unspecified: Secondary | ICD-10-CM

## 2015-03-15 DIAGNOSIS — R7989 Other specified abnormal findings of blood chemistry: Secondary | ICD-10-CM | POA: Diagnosis not present

## 2015-03-15 DIAGNOSIS — E119 Type 2 diabetes mellitus without complications: Secondary | ICD-10-CM | POA: Diagnosis not present

## 2015-03-15 DIAGNOSIS — R945 Abnormal results of liver function studies: Secondary | ICD-10-CM

## 2015-03-15 DIAGNOSIS — R799 Abnormal finding of blood chemistry, unspecified: Secondary | ICD-10-CM | POA: Diagnosis not present

## 2015-03-15 DIAGNOSIS — IMO0002 Reserved for concepts with insufficient information to code with codable children: Secondary | ICD-10-CM

## 2015-03-15 LAB — HEMOGLOBIN A1C
Hgb A1c MFr Bld: 7.6 % — ABNORMAL HIGH (ref <5.7)
Mean Plasma Glucose: 171 mg/dL — ABNORMAL HIGH (ref <117)

## 2015-03-15 LAB — COMPREHENSIVE METABOLIC PANEL
ALBUMIN: 4.4 g/dL (ref 3.5–5.2)
ALT: 51 U/L — ABNORMAL HIGH (ref 0–35)
AST: 41 U/L — ABNORMAL HIGH (ref 0–37)
Alkaline Phosphatase: 39 U/L (ref 39–117)
BUN: 21 mg/dL (ref 6–23)
CALCIUM: 9.1 mg/dL (ref 8.4–10.5)
CO2: 26 meq/L (ref 19–32)
Chloride: 104 mEq/L (ref 96–112)
Creat: 0.97 mg/dL (ref 0.50–1.10)
GLUCOSE: 117 mg/dL — AB (ref 70–99)
POTASSIUM: 4.1 meq/L (ref 3.5–5.3)
Sodium: 141 mEq/L (ref 135–145)
TOTAL PROTEIN: 7.1 g/dL (ref 6.0–8.3)
Total Bilirubin: 0.5 mg/dL (ref 0.2–1.2)

## 2015-03-15 MED ORDER — METFORMIN HCL 500 MG PO TABS: ORAL_TABLET | ORAL | Status: DC

## 2015-03-15 MED ORDER — GLUCOSE BLOOD VI STRP: ORAL_STRIP | Status: DC

## 2015-03-15 NOTE — Patient Instructions (Signed)
Great to see you today as always!  I will be in touch with your labs asap Please add your metformin back- start with 500 mg once a day, go up to twice a day Please let me know how your sugar is looking after a few weeks of this regimen.  We may need to increase your lantus if you are still running hihg.

## 2015-03-15 NOTE — Progress Notes (Addendum)
[facility] [address] [place] [address] [phone]  Date:  03/15/2015   Name:  Mandy Kim   DOB:  11-29-1946   MRN:  [id number]  PCP:  Lamar Blinks, MD    Chief Complaint: Follow-up   History of Present Illness:  Mandy Kim is a 69 y.o. very pleasant female patient who presents with the following:  Last seen by myself in January at which time her A1c was 12.  We added trulicity to her regimen at that time, continued metformin.  However she was not able to tolerate trulicity, went on lantus instead about one month ago  Here today for a recheck of her DM She has a history of high triglycerides and has been intolerant to statins.    Her FBG is running 150- 200.  She is on 20 units of lantus right now . She has worked on her diet but she  She is running in the 200s in the evening before bed.   She has been on lantus 20 units for a week or so She stopped her metformin because she thought she was supposed to stop it when she started the insulin She is not having any low glucose readings.     Lab Results  Component Value Date   HGBA1C 12.1* 12/21/2014     Wt Readings from Last 3 Encounters:  03/15/15 178 lb 12.8 oz (81.103 kg)  12/21/14 179 lb (81.194 kg)  11/11/14 189 lb (85.73 kg)     Patient Active Problem List   Diagnosis Date Noted  . Peripheral neuropathy 07/27/2014  . Other and unspecified hyperlipidemia 02/12/2012  . AAA (abdominal aortic aneurysm) 02/12/2012  . Fatty liver 02/12/2012  . Breast cancer 02/12/2012  . Cervical cancer 02/12/2012  . Diabetes mellitus 02/12/2012  . Hyperlipidemia 02/12/2012    Past Medical History  Diagnosis Date  . Chronic kidney disease   . Hypertension   . Hyperlipidemia   . Diabetes mellitus   . Cancer   . Allergy   . GERD (gastroesophageal reflux disease)   . Cataract   . Diverticulitis   . Sleep apnea     has c pap machine    Past Surgical History  Procedure Laterality  Date  . Abdominal aortic aneurysm repair    . Mastectomy    . Tonsillectomy    . Bladder tacking    . Cataract extraction, bilateral    . Abdominal aortic aneurysm repair    . Abdominal hysterectomy  2000  . Breast surgery    . Eye surgery      History  Substance Use Topics  . Smoking status: Former Smoker -- 30 years    Types: Cigarettes    Quit date: 06/07/1999  . Smokeless tobacco: Not on file  . Alcohol Use: Not on file    Family History  Problem Relation Age of Onset  . Cancer Mother   . Pneumonia Mother   . Thyroid disease Mother   . Thyroid disease Sister   . Obesity Daughter   . Hypertension Daughter   . Thyroid disease Son   . Pneumonia Maternal Grandmother   . Heart attack Maternal Grandfather     Allergies  Allergen Reactions  . Aspirin     Per pt any pain medications  . Crestor [Rosuvastatin Calcium]   . Lipitor [Atorvastatin Calcium]   . Pravastatin   . Simvastatin     Medication list has been reviewed and updated.  Current Outpatient Prescriptions on File Prior to Visit  Medication Sig Dispense Refill  . ACCU-CHEK AVIVA PLUS test strip USE AS DIRECTED 100 each 2  . ACCU-CHEK FASTCLIX LANCETS MISC TEST TWICE A DAY 102 each 2  . amitriptyline (ELAVIL) 25 MG tablet Take 1 tablet (25 mg total) by mouth at bedtime. 90 tablet 3  . Biotin 1000 MCG tablet Take 1,000 mcg by mouth daily.    . calcium citrate-vitamin D 200-200 MG-UNIT TABS Take 1 tablet by mouth 2 (two) times daily.    . cholecalciferol (VITAMIN D) 1000 UNITS tablet Take 1,000 Units by mouth daily.    . clopidogrel (PLAVIX) 75 MG tablet TAKE 1 TABLET (75 MG TOTAL) BY MOUTH DAILY. 90 tablet 3  . fish oil-omega-3 fatty acids 1000 MG capsule Take 1-2 g by mouth 2 (two) times daily. Patient takes 1 tablet in the morning and 2 tablets at bedtime    . gemfibrozil (LOPID) 600 MG tablet TAKE 1 TABLET (600 MG TOTAL) BY MOUTH 2 (TWO) TIMES DAILY BEFORE A MEAL. 180 tablet 3  . hydrOXYzine  (ATARAX/VISTARIL) 25 MG tablet Take 0.5-1 tablets (12.5-25 mg total) by mouth 3 (three) times daily as needed for itching. 30 tablet 0  . Insulin Glargine (LANTUS SOLOSTAR) 100 UNIT/ML Solostar Pen Start with 10 units.  Increase by 2 units every 2 days as long as fasting sugar is over 150 5 pen 6  . Insulin Pen Needle 31G X 6 MM MISC Use daily with lantus pen.  Ok to substitute other size of pen needle if necessary 100 each 3  . metFORMIN (GLUCOPHAGE) 500 MG tablet TAKE 1 TABLET (500 MG TOTAL) BY MOUTH 2 (TWO) TIMES DAILY WITH A MEAL. 180 tablet 3  . metoprolol succinate (TOPROL-XL) 25 MG 24 hr tablet TAKE 1 TABLET (25 MG TOTAL) BY MOUTH DAILY. 90 tablet 3  . Multiple Vitamin (MULTIVITAMIN) tablet Take 0.5 tablets by mouth 2 (two) times daily.     . pantoprazole (PROTONIX) 40 MG tablet TAKE 1 TABLET BY MOUTH EVERY DAY ON AN EMPTY STOMACH 90 tablet 2  . potassium chloride SA (KLOR-CON M20) 20 MEQ tablet Take 1 tablet (20 mEq total) by mouth daily. 90 tablet 3  . pyridOXINE (VITAMIN B-6) 100 MG tablet Take 100 mg by mouth daily.     No current facility-administered medications on file prior to visit.    Review of Systems:  As per HPI- otherwise negative.  Creat clearance is 70 Physical Examination: Filed Vitals:   03/15/15 1109  BP: 128/66  Pulse: 53  Temp: 97.7 F (36.5 C)  Resp: 16   Filed Vitals:   03/15/15 1109  Height: 5' 4.75" (1.645 m)  Weight: 178 lb 12.8 oz (81.103 kg)   Body mass index is 29.97 kg/(m^2). Ideal Body Weight: Weight in (lb) to have BMI = 25: 148.8  GEN: WDWN, NAD, Non-toxic, A & O x 3, overweight, but looks well HEENT: Atraumatic, Normocephalic. Neck supple. No masses, No LAD. Ears and Nose: No external deformity. CV: RRR, No M/G/R. No JVD. No thrill. No extra heart sounds. PULM: CTA B, no wheezes, crackles, rhonchi. No retractions. No resp. distress. No accessory muscle use. EXTR: No c/c/e NEURO Normal gait.  PSYCH: Normally interactive. Conversant.  Not depressed or anxious appearing.  Calm demeanor.    Assessment and Plan: Diabetes mellitus type 2, uncontrolled - Plan: HM Diabetes Foot Exam, glucose blood (ACCU-CHEK AVIVA PLUS) test strip, Hemoglobin A1c  Urinary tract infection without hematuria, site  unspecified - Plan: Ambulatory referral to Urology  Diabetes mellitus type 2 with complications - Plan: metFORMIN (GLUCOPHAGE) 500 MG tablet  Screening for osteoporosis - Plan: HM DEXA SCAN  Elevated liver function tests - Plan: Comprehensive metabolic panel  Discussed her DM control.  Await her a1c and will give her a call.  She will go back on her metformin- See patient instructions for more details.    Referral to urology as required by her insurance Referral for a dexa scan  Signed Lamar Blinks, MD   4/12: received her labs and gave her a call.  Great news, her A1c is a lot better. She is pleased.  Asked her to add her metformin back in as we discussed, and to continue 20 units of lantus for now.  She is to give me a call with an update in about 3 weeks.  Repeat labs in 3 months- placed orders for fasting future labs for her.  Her LFTs are better- hope that triglycerides will also be better since her A1c is improved   Results for orders placed or performed in visit on 03/15/15  Hemoglobin A1c  Result Value Ref Range   Hgb A1c MFr Bld 7.6 (H) <5.7 %   Mean Plasma Glucose 171 (H) <117 mg/dL  Comprehensive metabolic panel  Result Value Ref Range   Sodium 141 135 - 145 mEq/L   Potassium 4.1 3.5 - 5.3 mEq/L   Chloride 104 96 - 112 mEq/L   CO2 26 19 - 32 mEq/L   Glucose, Bld 117 (H) 70 - 99 mg/dL   BUN 21 6 - 23 mg/dL   Creat 0.97 0.50 - 1.10 mg/dL   Total Bilirubin 0.5 0.2 - 1.2 mg/dL   Alkaline Phosphatase 39 39 - 117 U/L   AST 41 (H) 0 - 37 U/L   ALT 51 (H) 0 - 35 U/L   Total Protein 7.1 6.0 - 8.3 g/dL   Albumin 4.4 3.5 - 5.2 g/dL   Calcium 9.1 8.4 - 10.5 mg/dL

## 2015-03-16 ENCOUNTER — Encounter: Payer: Self-pay | Admitting: Family Medicine

## 2015-03-16 NOTE — Addendum Note (Signed)
Addended by: Lamar Blinks C on: 03/16/2015 08:50 AM   Modules accepted: Orders

## 2015-03-22 ENCOUNTER — Ambulatory Visit: Payer: Commercial Managed Care - HMO | Admitting: Family Medicine

## 2015-03-22 DIAGNOSIS — R300 Dysuria: Secondary | ICD-10-CM | POA: Diagnosis not present

## 2015-03-22 DIAGNOSIS — N816 Rectocele: Secondary | ICD-10-CM | POA: Diagnosis not present

## 2015-03-22 DIAGNOSIS — N8111 Cystocele, midline: Secondary | ICD-10-CM | POA: Diagnosis not present

## 2015-03-26 ENCOUNTER — Other Ambulatory Visit: Payer: Self-pay | Admitting: Family Medicine

## 2015-03-26 DIAGNOSIS — Z9889 Other specified postprocedural states: Secondary | ICD-10-CM

## 2015-03-26 NOTE — Telephone Encounter (Signed)
Dr Lorelei Pont, you just saw pt for check up, but don't see this med discussed. OK to RF?

## 2015-03-27 NOTE — Telephone Encounter (Signed)
Called her- she is checking her glucose regularly at home, some of her readings:  178, 151, 147, 190, [id number] She is on lantus 20u and metformin.   Lab Results  Component Value Date   HGBA1C 7.6* 03/15/2015   We plan to recheck in the office in 4 months Refilled her plavix

## 2015-04-24 ENCOUNTER — Other Ambulatory Visit: Payer: Self-pay | Admitting: Family Medicine

## 2015-07-23 ENCOUNTER — Other Ambulatory Visit: Payer: Self-pay | Admitting: Family Medicine

## 2015-07-23 DIAGNOSIS — Z48812 Encounter for surgical aftercare following surgery on the circulatory system: Secondary | ICD-10-CM | POA: Diagnosis not present

## 2015-07-23 DIAGNOSIS — I714 Abdominal aortic aneurysm, without rupture: Secondary | ICD-10-CM | POA: Diagnosis not present

## 2015-08-02 DIAGNOSIS — Z48812 Encounter for surgical aftercare following surgery on the circulatory system: Secondary | ICD-10-CM | POA: Diagnosis not present

## 2015-08-02 DIAGNOSIS — I714 Abdominal aortic aneurysm, without rupture: Secondary | ICD-10-CM | POA: Diagnosis not present

## 2015-08-16 ENCOUNTER — Ambulatory Visit: Payer: Commercial Managed Care - HMO | Admitting: Family Medicine

## 2015-08-17 NOTE — Telephone Encounter (Signed)
No noter

## 2015-08-18 ENCOUNTER — Encounter: Payer: Self-pay | Admitting: Family Medicine

## 2015-08-18 ENCOUNTER — Ambulatory Visit (INDEPENDENT_AMBULATORY_CARE_PROVIDER_SITE_OTHER): Payer: Commercial Managed Care - HMO | Admitting: Family Medicine

## 2015-08-18 VITALS — BP 128/64 | HR 65 | Temp 98.3°F | Resp 16 | Ht 65.0 in | Wt 186.0 lb

## 2015-08-18 DIAGNOSIS — H43391 Other vitreous opacities, right eye: Secondary | ICD-10-CM

## 2015-08-18 DIAGNOSIS — E785 Hyperlipidemia, unspecified: Secondary | ICD-10-CM

## 2015-08-18 DIAGNOSIS — E118 Type 2 diabetes mellitus with unspecified complications: Secondary | ICD-10-CM | POA: Diagnosis not present

## 2015-08-18 DIAGNOSIS — E119 Type 2 diabetes mellitus without complications: Secondary | ICD-10-CM | POA: Diagnosis not present

## 2015-08-18 DIAGNOSIS — Z130 Encounter for screening for diseases of the blood and blood-forming organs and certain disorders involving the immune mechanism: Secondary | ICD-10-CM

## 2015-08-18 LAB — COMPREHENSIVE METABOLIC PANEL
ALK PHOS: 31 U/L — AB (ref 33–130)
ALT: 61 U/L — AB (ref 6–29)
AST: 49 U/L — ABNORMAL HIGH (ref 10–35)
Albumin: 4.6 g/dL (ref 3.6–5.1)
BUN: 21 mg/dL (ref 7–25)
CHLORIDE: 106 mmol/L (ref 98–110)
CO2: 22 mmol/L (ref 20–31)
Calcium: 9.5 mg/dL (ref 8.6–10.4)
Creat: 0.79 mg/dL (ref 0.60–0.93)
Glucose, Bld: 141 mg/dL — ABNORMAL HIGH (ref 65–99)
POTASSIUM: 4.2 mmol/L (ref 3.5–5.3)
Sodium: 140 mmol/L (ref 135–146)
TOTAL PROTEIN: 7 g/dL (ref 6.1–8.1)
Total Bilirubin: 0.6 mg/dL (ref 0.2–1.2)

## 2015-08-18 LAB — CBC
HCT: 39.4 % (ref 36.0–46.0)
Hemoglobin: 13.5 g/dL (ref 12.0–15.0)
MCH: 29.6 pg (ref 26.0–34.0)
MCHC: 34.3 g/dL (ref 30.0–36.0)
MCV: 86.4 fL (ref 78.0–100.0)
MPV: 10.6 fL (ref 8.6–12.4)
Platelets: 255 10*3/uL (ref 150–400)
RBC: 4.56 MIL/uL (ref 3.87–5.11)
RDW: 15.3 % (ref 11.5–15.5)
WBC: 5.4 10*3/uL (ref 4.0–10.5)

## 2015-08-18 LAB — LIPID PANEL
CHOL/HDL RATIO: 8.5 ratio — AB (ref <=5.0)
CHOLESTEROL: 179 mg/dL (ref 125–200)
HDL: 21 mg/dL — AB (ref >=46)
LDL Cholesterol: 82 mg/dL (ref <130)
Triglycerides: 380 mg/dL — ABNORMAL HIGH (ref <150)
VLDL: 76 mg/dL — ABNORMAL HIGH (ref <30)

## 2015-08-18 NOTE — Patient Instructions (Signed)
I will be in touch with your labs We will set up an eye appt and give you a call

## 2015-08-18 NOTE — Progress Notes (Addendum)
[facility] [address], [place] [address] [phone]  Date:  08/18/2015   Name:  Mandy Kim   DOB:  Sep 03, 1946   MRN:  [id number]  PCP:  Lamar Blinks, MD    Chief Complaint: Diabetes; Hyperlipidemia; Eye Injury; Leg Pain; and Toe Pain   History of Present Illness:  Mandy Kim is a 69 y.o. very pleasant female patient who presents with the following:  History of HTN, DM.   She refuses flu shot  She had fasting labs this am  She notes a "knot" on her right calf.  She thinks it has been there for a month or so. It does not hurt She also notes a corn on her toe- they are using an OTC remover  She also notes what sounds like a new floater in her right eye over the last 3 weeks;  She describes this as being "blood in my eye."  It does seem to be getting better and her vision is OW ok.  She does not have an ophthalmologist so she has not been seen for this. No flashers,   She does wear corrective lenses per her optometrist at [facility] She is taking 20 units of lantus daily at this point   Lab Results  Component Value Date   HGBA1C 7.6* 03/15/2015     Patient Active Problem List   Diagnosis Date Noted  . Peripheral neuropathy 07/27/2014  . Other and unspecified hyperlipidemia 02/12/2012  . AAA (abdominal aortic aneurysm) 02/12/2012  . Fatty liver 02/12/2012  . Breast cancer 02/12/2012  . Cervical cancer 02/12/2012  . Diabetes mellitus 02/12/2012  . Hyperlipidemia 02/12/2012    Past Medical History  Diagnosis Date  . Chronic kidney disease   . Hypertension   . Hyperlipidemia   . Diabetes mellitus   . Cancer   . Allergy   . GERD (gastroesophageal reflux disease)   . Cataract   . Diverticulitis   . Sleep apnea     has c pap machine    Past Surgical History  Procedure Laterality Date  . Abdominal aortic aneurysm repair    . Mastectomy    . Tonsillectomy    . Bladder tacking    . Cataract extraction, bilateral    .  Abdominal aortic aneurysm repair    . Abdominal hysterectomy  2000  . Breast surgery    . Eye surgery      Social History  Substance Use Topics  . Smoking status: Former Smoker -- 30 years    Types: Cigarettes    Quit date: 06/07/1999  . Smokeless tobacco: None  . Alcohol Use: None    Family History  Problem Relation Age of Onset  . Cancer Mother   . Pneumonia Mother   . Thyroid disease Mother   . Thyroid disease Sister   . Obesity Daughter   . Hypertension Daughter   . Thyroid disease Son   . Pneumonia Maternal Grandmother   . Heart attack Maternal Grandfather     Allergies  Allergen Reactions  . Aspirin     Per pt any pain medications  . Crestor [Rosuvastatin Calcium]   . Lipitor [Atorvastatin Calcium]   . Pravastatin   . Simvastatin     Medication list has been reviewed and updated.  Current Outpatient Prescriptions on File Prior to Visit  Medication Sig Dispense Refill  . ACCU-CHEK FASTCLIX LANCETS MISC TEST TWICE A DAY 102 each 2  . b complex vitamins tablet  Take 1 tablet by mouth daily.    . Biotin 1000 MCG tablet Take 1,000 mcg by mouth daily.    . calcium citrate-vitamin D 200-200 MG-UNIT TABS Take 1 tablet by mouth 2 (two) times daily.    . cholecalciferol (VITAMIN D) 1000 UNITS tablet Take 1,000 Units by mouth daily.    . clopidogrel (PLAVIX) 75 MG tablet TAKE 1 TABLET BY MOUTH EVERY DAY 90 tablet 3  . fish oil-omega-3 fatty acids 1000 MG capsule Take 1-2 g by mouth 2 (two) times daily. Patient takes 1 tablet in the morning and 2 tablets at bedtime    . gemfibrozil (LOPID) 600 MG tablet TAKE 1 TABLET (600 MG TOTAL) BY MOUTH 2 (TWO) TIMES DAILY BEFORE A MEAL. 180 tablet 3  . glucose blood (ACCU-CHEK AVIVA PLUS) test strip Pt tests twice a day, on insulin 100 each 2  . Insulin Glargine (LANTUS SOLOSTAR) 100 UNIT/ML Solostar Pen Start with 10 units.  Increase by 2 units every 2 days as long as fasting sugar is over 150 5 pen 6  . Insulin Pen Needle 31G X 6  MM MISC Use daily with lantus pen.  Ok to substitute other size of pen needle if necessary 100 each 3  . metFORMIN (GLUCOPHAGE) 500 MG tablet TAKE 1 TABLET (500 MG TOTAL) BY MOUTH 2 (TWO) TIMES DAILY WITH A MEAL. 180 tablet 3  . metoprolol succinate (TOPROL-XL) 25 MG 24 hr tablet TAKE 1 TABLET (25 MG TOTAL) BY MOUTH DAILY. 90 tablet 3  . Multiple Vitamin (MULTIVITAMIN) tablet Take 0.5 tablets by mouth 2 (two) times daily.     . pantoprazole (PROTONIX) 40 MG tablet TAKE 1 TABLET BY MOUTH EVERY DAY ON AN EMPTY STOMACH. 90 tablet 0  . potassium chloride SA (KLOR-CON M20) 20 MEQ tablet Take 1 tablet (20 mEq total) by mouth daily. 90 tablet 3  . pyridOXINE (VITAMIN B-6) 100 MG tablet Take 100 mg by mouth daily.    Marland Kitchen amitriptyline (ELAVIL) 25 MG tablet Take 1 tablet (25 mg total) by mouth at bedtime. (Patient not taking: Reported on 08/18/2015) 90 tablet 3  . hydrOXYzine (ATARAX/VISTARIL) 25 MG tablet Take 0.5-1 tablets (12.5-25 mg total) by mouth 3 (three) times daily as needed for itching. (Patient not taking: Reported on 03/15/2015) 30 tablet 0   No current facility-administered medications on file prior to visit.    Review of Systems:  As per HPI- otherwise negative.   Physical Examination: Filed Vitals:   08/18/15 1143  BP: 128/64  Pulse: 65  Temp: 98.3 F (36.8 C)  Resp: 16   Filed Vitals:   08/18/15 1143  Height: 5\' 5"  (1.651 m)  Weight: 186 lb (84.369 kg)   Body mass index is 30.95 kg/(m^2). Ideal Body Weight: Weight in (lb) to have BMI = 25: 149.9  GEN: WDWN, NAD, Non-toxic, A & O x 3, obese, looks well HEENT: Atraumatic, Normocephalic. Neck supple. No masses, No LAD.  Bilateral TM wnl, oropharynx normal.  PEERL,EOMI.   Ears and Nose: No external deformity. CV: RRR, No M/G/R. No JVD. No thrill. No extra heart sounds. PULM: CTA B, no wheezes, crackles, rhonchi. No retractions. No resp. distress. No accessory muscle use EXTR: No c/c/e NEURO Normal gait.  PSYCH: Normally  interactive. Conversant. Not depressed or anxious appearing.  Calm demeanor.  Limited fundoscopic exam wnl  Small corn on left little toe- she is treating this at home, it appears to be going away, no ulceration She points out area on  lateral right calf- seems to be a dilated blood vessel, does not appear to be concerning  Called and obtained appt Tomorrow at 2pm Dr. Katy Fitch- let per know on phone  Assessment and Plan: Diabetes mellitus type 2, controlled - Plan: Comprehensive metabolic panel, Hemoglobin A1c  Dyslipidemia - Plan: Lipid panel  Screening for deficiency anemia - Plan: CBC  Floaters in visual field, right  Check labs today- follow-up with her Arranged appt with optho tomorrow to eval new floaters  Signed Lamar Blinks, MD  Received her labs, called to discuss with her on 9/21 Her A1c is table, but still above ideal.  She is tolerating metformin well- will increase her dose to 1,000 BID Her triglycerides are better but still her lipid ratio is awful. She is not able to tolerate statins.  She is taking gemfibrozil and omega 3 supplement.  She has tried getting more exercise but it never seemed to help.   She would be willing to try a low dose of niacin in addition to her gemfibrozil Will make these 2 changes and recheck in 4-6 months  Mild increase in her LFTs- [place] in February of this year showed fatty liver  Results for orders placed or performed in visit on 08/18/15  CBC  Result Value Ref Range   WBC 5.4 4.0 - 10.5 K/uL   RBC 4.56 3.87 - 5.11 MIL/uL   Hemoglobin 13.5 12.0 - 15.0 g/dL   HCT 39.4 36.0 - 46.0 %   MCV 86.4 78.0 - 100.0 fL   MCH 29.6 26.0 - 34.0 pg   MCHC 34.3 30.0 - 36.0 g/dL   RDW 15.3 11.5 - 15.5 %   Platelets 255 150 - 400 K/uL   MPV 10.6 8.6 - 12.4 fL  Comprehensive metabolic panel  Result Value Ref Range   Sodium 140 135 - 146 mmol/L   Potassium 4.2 3.5 - 5.3 mmol/L   Chloride 106 98 - 110 mmol/L   CO2 22 20 - 31 mmol/L   Glucose, Bld  141 (H) 65 - 99 mg/dL   BUN 21 7 - 25 mg/dL   Creat 0.79 0.60 - 0.93 mg/dL   Total Bilirubin 0.6 0.2 - 1.2 mg/dL   Alkaline Phosphatase 31 (L) 33 - 130 U/L   AST 49 (H) 10 - 35 U/L   ALT 61 (H) 6 - 29 U/L   Total Protein 7.0 6.1 - 8.1 g/dL   Albumin 4.6 3.6 - 5.1 g/dL   Calcium 9.5 8.6 - 10.4 mg/dL  Lipid panel  Result Value Ref Range   Cholesterol 179 125 - 200 mg/dL   Triglycerides 380 (H) <150 mg/dL   HDL 21 (L) >=46 mg/dL   Total CHOL/HDL Ratio 8.5 (H) <=5.0 Ratio   VLDL 76 (H) <30 mg/dL   LDL Cholesterol 82 <130 mg/dL  Hemoglobin A1c  Result Value Ref Range   Hgb A1c MFr Bld 7.5 (H) <5.7 %   Mean Plasma Glucose 169 (H) <117 mg/dL

## 2015-08-19 DIAGNOSIS — H43391 Other vitreous opacities, right eye: Secondary | ICD-10-CM | POA: Diagnosis not present

## 2015-08-19 DIAGNOSIS — E119 Type 2 diabetes mellitus without complications: Secondary | ICD-10-CM | POA: Diagnosis not present

## 2015-08-19 DIAGNOSIS — Z961 Presence of intraocular lens: Secondary | ICD-10-CM | POA: Diagnosis not present

## 2015-08-19 DIAGNOSIS — H43811 Vitreous degeneration, right eye: Secondary | ICD-10-CM | POA: Diagnosis not present

## 2015-08-19 DIAGNOSIS — H26492 Other secondary cataract, left eye: Secondary | ICD-10-CM | POA: Diagnosis not present

## 2015-08-19 LAB — HM DIABETES EYE EXAM

## 2015-08-19 LAB — HEMOGLOBIN A1C
Hgb A1c MFr Bld: 7.5 % — ABNORMAL HIGH (ref <5.7)
Mean Plasma Glucose: 169 mg/dL — ABNORMAL HIGH (ref <117)

## 2015-08-25 ENCOUNTER — Encounter: Payer: Self-pay | Admitting: Family Medicine

## 2015-08-25 MED ORDER — NIACIN ER (ANTIHYPERLIPIDEMIC) 500 MG PO TBCR: 500.0000 mg | EXTENDED_RELEASE_TABLET | Freq: Every day | ORAL | Status: AC

## 2015-08-25 MED ORDER — METFORMIN HCL 1000 MG PO TABS: ORAL_TABLET | ORAL | Status: DC

## 2015-08-25 NOTE — Addendum Note (Signed)
Addended by: Lamar Blinks C on: 08/25/2015 05:20 PM   Modules accepted: Orders, Medications

## 2015-09-03 ENCOUNTER — Encounter: Payer: Self-pay | Admitting: Family Medicine

## 2015-09-16 ENCOUNTER — Telehealth: Payer: Self-pay

## 2015-09-16 NOTE — Telephone Encounter (Signed)
Pt is requesting that dr copland return her call she did not go into details jus that she called her back

## 2015-09-17 ENCOUNTER — Telehealth: Payer: Self-pay | Admitting: Family Medicine

## 2015-09-17 DIAGNOSIS — N390 Urinary tract infection, site not specified: Secondary | ICD-10-CM

## 2015-09-17 DIAGNOSIS — I10 Essential (primary) hypertension: Secondary | ICD-10-CM

## 2015-09-17 NOTE — Telephone Encounter (Signed)
Which referrals? Herschell Dimes, cardiologist. Dr. Louis Meckel, urology.

## 2015-09-17 NOTE — Telephone Encounter (Signed)
Dr. Lorelei Pont which codes should I attach to this?

## 2015-09-17 NOTE — Telephone Encounter (Signed)
Herschell Dimes, cardiologist. Dr. Louis Meckel, urology Did referrals for her

## 2015-09-17 NOTE — Telephone Encounter (Signed)
Patient states that she needs two referrals. She is going to one of the doctors on Monday and needs the referral done by then.   [phone]

## 2015-09-17 NOTE — Telephone Encounter (Signed)
I sent Dr. Lorelei Pont a message on her referrals already.

## 2015-09-20 DIAGNOSIS — N390 Urinary tract infection, site not specified: Secondary | ICD-10-CM | POA: Diagnosis not present

## 2015-09-20 DIAGNOSIS — Z87440 Personal history of urinary (tract) infections: Secondary | ICD-10-CM | POA: Diagnosis not present

## 2015-09-29 ENCOUNTER — Telehealth: Payer: Self-pay | Admitting: Family Medicine

## 2015-09-29 NOTE — Telephone Encounter (Signed)
-----   Message from [employer] sent at 09/28/2015 10:24 AM EDT ----- She needed a [facility] referral for Dr Santina Evans and Dr Louis Meckel, so her future visits would be covered.  Sorry for the confusion.  ----- Message -----    From: Darreld Mclean, MD    Sent: 09/27/2015   8:59 PM      To: [employer]ne [employer]  Regarding message: Eustaquio Maize from [facility] called to inquire about the referral. She said the patient was seen a couple months ago for a follow-up and was doing well. Also, the referral was for Cardiology, and their facility is V[facility]. She wasn't sure if the referral was meant for another facility, or if she needed this referral since her follow-up at their facility was normal. Please advise, thanks. ----------------------------------------------------------- I really don't know- the pt contacted me asking for a referral to her cardiologist who she said was [employer].  She may have been wrong about the name of her cardiologist.  Can you please give her a call- I am glad to re-do referral for the right person.    Thanks, JC

## 2015-09-29 NOTE — Telephone Encounter (Signed)
Do I need to do anything further?  I did both referrals for her already

## 2015-09-29 NOTE — Telephone Encounter (Signed)
Dr Lorelei Pont, that is all that was needed.  Thanks!

## 2015-10-14 ENCOUNTER — Encounter: Payer: Self-pay | Admitting: Family Medicine

## 2015-10-14 ENCOUNTER — Telehealth: Payer: Self-pay | Admitting: Family Medicine

## 2015-10-14 NOTE — Telephone Encounter (Signed)
Spoke with patient and she is going to call me back and let me know where and when she had her colonoscopy.

## 2015-10-18 ENCOUNTER — Other Ambulatory Visit: Payer: Self-pay | Admitting: Physician Assistant

## 2015-11-03 ENCOUNTER — Encounter: Payer: Self-pay | Admitting: Family Medicine

## 2015-11-03 ENCOUNTER — Telehealth: Payer: Self-pay | Admitting: Family Medicine

## 2015-11-03 NOTE — Telephone Encounter (Signed)
Spoke with patient and she stated that she had her colonoscopy at [facility] with Dr. Octaviano Batty.  Will have them fax the report over and update her chart.

## 2015-11-19 ENCOUNTER — Other Ambulatory Visit: Payer: Self-pay | Admitting: Family Medicine

## 2015-11-19 NOTE — Progress Notes (Unsigned)
Received a note from referrals:  Mandy Kim from [facility] called to inquire about the referral. She said the patient was seen a couple months ago for a follow-up and was doing well. Also, the referral was for Cardiology, and their facility is Vascular. She wasn't sure if the referral was meant for another facility, or if she needed this referral since her follow-up at their facility was normal. Please advise, thanks.  Looked through chart- pt had requested a referral from [place] back in October for her urologist and "cardiologist" who she stated was Dr. Audrie Lia. This was done and it sounds like she saw Dr. Audrie Lia in October.  I don't think that she needs another referral at this time

## 2015-12-12 ENCOUNTER — Other Ambulatory Visit: Payer: Self-pay | Admitting: Family Medicine

## 2015-12-13 NOTE — Telephone Encounter (Signed)
Dr Lorelei Pont, I can't tell if you still want pt taking this. At 9/14 OV it is on her current med list at [place] and on AVS at end of OV, but it is listed as a discontinued med at bottom of OV notes. Please advise.

## 2015-12-20 ENCOUNTER — Other Ambulatory Visit: Payer: Self-pay

## 2015-12-20 MED ORDER — BLOOD GLUCOSE TEST VI STRP: ORAL_STRIP | Status: DC

## 2015-12-20 MED ORDER — ALCOHOL SWABS PADS: MEDICATED_PAD | Status: DC

## 2015-12-20 MED ORDER — LANCETS MISC: Status: DC

## 2015-12-20 MED ORDER — GLUCOSE CONTROL VI SOLN: Status: AC

## 2015-12-20 MED ORDER — INSULIN GLARGINE 100 UNIT/ML SOLOSTAR PEN: PEN_INJECTOR | SUBCUTANEOUS | Status: DC

## 2015-12-20 MED ORDER — BLOOD GLUCOSE METER KIT: PACK | Status: DC

## 2015-12-20 MED ORDER — INSULIN PEN NEEDLE 31G X 6 MM MISC: Status: DC

## 2015-12-20 MED ORDER — GLUCOSE CONTROL VI SOLN: Status: DC

## 2015-12-20 MED ORDER — BLOOD GLUCOSE METER KIT
PACK | Status: DC
Start: 2015-12-20 — End: 2015-12-20

## 2015-12-21 ENCOUNTER — Ambulatory Visit (INDEPENDENT_AMBULATORY_CARE_PROVIDER_SITE_OTHER): Payer: Commercial Managed Care - HMO | Admitting: Family Medicine

## 2015-12-21 VITALS — BP 118/70 | HR 82 | Temp 98.8°F | Resp 17 | Ht 64.0 in | Wt 183.0 lb

## 2015-12-21 DIAGNOSIS — J0110 Acute frontal sinusitis, unspecified: Secondary | ICD-10-CM

## 2015-12-21 MED ORDER — AMOXICILLIN 500 MG PO CAPS: 1000.0000 mg | ORAL_CAPSULE | Freq: Two times a day (BID) | ORAL | Status: DC

## 2015-12-21 NOTE — Progress Notes (Signed)
[facility] [address] [place] [address] [phone]  Date:  12/21/2015   Name:  Mandy Kim   DOB:  1946-09-30   MRN:  [id number]  PCP:  Lamar Blinks, MD    Chief Complaint: Sinusitis and Headache   History of Present Illness:  Mandy Kim is a 70 y.o. very pleasant female patient who presents with the following:  Here today with illness.  "I'm miserable."  She noted a head cold the week after christmas- got better but her sx returned about 3 days ago.   She notes a HA, nasal congestion, sinus pressure and pain, she has felt feverish.  She endorses that she checked her temp and had a fever up to 102 last night.  This is now gone and she has not used any antipyretics  She is getting discolored mucus from her nose No cough really  The sx are all in her nose and sinuses   She is not allergic to any abx.  She has noted some sneezing  Lab Results  Component Value Date   HGBA1C 7.5* 08/18/2015     Patient Active Problem List   Diagnosis Date Noted  . Peripheral neuropathy ([place]) 07/27/2014  . Other and unspecified hyperlipidemia 02/12/2012  . AAA (abdominal aortic aneurysm) ([place]) 02/12/2012  . Fatty liver 02/12/2012  . Breast cancer ([place]) 02/12/2012  . Cervical cancer ([place]) 02/12/2012  . Diabetes mellitus 02/12/2012  . Hyperlipidemia 02/12/2012    Past Medical History  Diagnosis Date  . Chronic kidney disease   . Hypertension   . Hyperlipidemia   . Diabetes mellitus   . Cancer ([place])   . Allergy   . GERD (gastroesophageal reflux disease)   . Cataract   . Diverticulitis   . Sleep apnea     has c pap machine    Past Surgical History  Procedure Laterality Date  . Abdominal aortic aneurysm repair    . Mastectomy    . Tonsillectomy    . Bladder tacking    . Cataract extraction, bilateral    . Abdominal aortic aneurysm repair    . Abdominal hysterectomy  2000  . Breast surgery    . Eye surgery      Social History   Substance Use Topics  . Smoking status: Former Smoker -- 30 years    Types: Cigarettes    Quit date: 06/07/1999  . Smokeless tobacco: None  . Alcohol Use: None    Family History  Problem Relation Age of Onset  . Cancer Mother   . Pneumonia Mother   . Thyroid disease Mother   . Thyroid disease Sister   . Obesity Daughter   . Hypertension Daughter   . Thyroid disease Son   . Pneumonia Maternal Grandmother   . Heart attack Maternal Grandfather     Allergies  Allergen Reactions  . Aspirin     Per pt any pain medications  . Crestor [Rosuvastatin Calcium]   . Lipitor [Atorvastatin Calcium]   . Pravastatin   . Simvastatin     Medication list has been reviewed and updated.  Current Outpatient Prescriptions on File Prior to Visit  Medication Sig Dispense Refill  . Alcohol Swabs PADS Use to test blood sugar 3 times daily. Dx: E11.9 300 each 2  . amitriptyline (ELAVIL) 25 MG tablet TAKE 1 TABLET (25 MG TOTAL) BY MOUTH AT BEDTIME. 90 tablet 3  . b complex vitamins tablet Take 1 tablet by mouth daily.    Marland Kitchen  Biotin 1000 MCG tablet Take 1,000 mcg by mouth daily.    . Blood Glucose Calibration (GLUCOSE CONTROL) SOLN Use to test blood sugar 3 times daily. Dx: E11.9 1 each 2  . blood glucose meter kit and supplies Use to test blood sugar 3 times daily. Dx: E11.9 1 each 0  . calcium citrate-vitamin D 200-200 MG-UNIT TABS Take 1 tablet by mouth 2 (two) times daily.    . cholecalciferol (VITAMIN D) 1000 UNITS tablet Take 1,000 Units by mouth daily.    . clopidogrel (PLAVIX) 75 MG tablet TAKE 1 TABLET BY MOUTH EVERY DAY 90 tablet 3  . fish oil-omega-3 fatty acids 1000 MG capsule Take 1-2 g by mouth 2 (two) times daily. Patient takes 1 tablet in the morning and 2 tablets at bedtime    . gemfibrozil (LOPID) 600 MG tablet TAKE 1 TABLET (600 MG TOTAL) BY MOUTH 2 (TWO) TIMES DAILY BEFORE A MEAL. 180 tablet 3  . Glucose Blood (BLOOD GLUCOSE TEST STRIPS) STRP Use to test blood sugar 3 times daily.  Dx: E11.9 300 each 0  . Insulin Glargine (LANTUS SOLOSTAR) 100 UNIT/ML Solostar Pen Inject 20 units into the skin daily. Dx: E11.9 30 mL 0  . Insulin Pen Needle 31G X 6 MM MISC Use daily with lantus pen.  Ok to substitute other size of pen needle if necessary. Dx: E11.9 100 each 2  . Lancets MISC Use to test blood sugar 3 times daily. Dx: E11.9 300 each 2  . metFORMIN (GLUCOPHAGE) 1000 MG tablet TAKE 1 TABLET (500 MG TOTAL) BY MOUTH 2 (TWO) TIMES DAILY WITH A MEAL. 180 tablet 3  . Multiple Vitamin (MULTIVITAMIN) tablet Take 0.5 tablets by mouth 2 (two) times daily.     . niacin (NIASPAN) 500 MG CR tablet Take 1 tablet (500 mg total) by mouth at bedtime. 30 tablet 3  . pantoprazole (PROTONIX) 40 MG tablet TAKE 1 TABLET BY MOUTH EVERY DAY ON AN EMPTY STOMACH 90 tablet 0  . potassium chloride SA (KLOR-CON M20) 20 MEQ tablet Take 1 tablet (20 mEq total) by mouth daily. 90 tablet 3  . pyridOXINE (VITAMIN B-6) 100 MG tablet Take 100 mg by mouth daily.     No current facility-administered medications on file prior to visit.    Review of Systems:  As per HPI- otherwise negative.   Physical Examination: Filed Vitals:   12/21/15 1426  BP: 118/70  Pulse: 82  Temp: 98.8 F (37.1 C)  Resp: 17   Filed Vitals:   12/21/15 1426  Height: '5\' 4"'$  (1.626 m)  Weight: 183 lb (83.008 kg)   Body mass index is 31.4 kg/(m^2). Ideal Body Weight: Weight in (lb) to have BMI = 25: 145.3  GEN: WDWN, NAD, Non-toxic, A & O x 3, obese,appears congested  HEENT: Atraumatic, Normocephalic. Neck supple. No masses, No LAD. Bilateral TM wnl, oropharynx normal.  PEERL,EOMI.   Frontal sinuses are TTP and the nasal cavity is congested  Ears and Nose: No external deformity. CV: RRR, No M/G/R. No JVD. No thrill. No extra heart sounds. PULM: CTA B, no wheezes, crackles, rhonchi. No retractions. No resp. distress. No accessory muscle use. EXTR: No c/c/e NEURO Normal gait.  PSYCH: Normally interactive. Conversant. Not  depressed or anxious appearing.  Calm demeanor.    Assessment and Plan: Acute frontal sinusitis, recurrence not specified - Plan: amoxicillin (AMOXIL) 500 MG capsule   Here today with acute sinusitis.  Treat with amoxicillin as above   Signed Vista Sawatzky,  MD   

## 2015-12-21 NOTE — Patient Instructions (Signed)
We are going to treat you with amoxicillin for your sinus infection Use tylenol if needed for your fever Let me know if you do not feel better in the next few days- Sooner if worse.   If you have any change of your symptoms please let me know

## 2015-12-24 ENCOUNTER — Encounter: Payer: Self-pay | Admitting: Family Medicine

## 2015-12-29 ENCOUNTER — Encounter: Payer: Self-pay | Admitting: Family Medicine

## 2016-01-16 ENCOUNTER — Other Ambulatory Visit: Payer: Self-pay | Admitting: Physician Assistant

## 2016-01-16 ENCOUNTER — Other Ambulatory Visit: Payer: Self-pay | Admitting: Family Medicine

## 2016-02-03 ENCOUNTER — Other Ambulatory Visit: Payer: Self-pay | Admitting: Family Medicine

## 2016-02-14 ENCOUNTER — Encounter: Payer: Self-pay | Admitting: Family Medicine

## 2016-02-14 ENCOUNTER — Ambulatory Visit (INDEPENDENT_AMBULATORY_CARE_PROVIDER_SITE_OTHER): Payer: Commercial Managed Care - HMO | Admitting: Family Medicine

## 2016-02-14 ENCOUNTER — Ambulatory Visit (HOSPITAL_BASED_OUTPATIENT_CLINIC_OR_DEPARTMENT_OTHER)
Admission: RE | Admit: 2016-02-14 | Discharge: 2016-02-14 | Disposition: A | Discharge status: Home / Self Care | Payer: Commercial Managed Care - HMO | Source: Ambulatory Visit | Attending: Family Medicine | Admitting: Family Medicine

## 2016-02-14 VITALS — BP 140/70 | HR 57 | Temp 97.6°F | Ht 64.0 in | Wt 171.4 lb

## 2016-02-14 DIAGNOSIS — Z9889 Other specified postprocedural states: Secondary | ICD-10-CM

## 2016-02-14 DIAGNOSIS — E119 Type 2 diabetes mellitus without complications: Secondary | ICD-10-CM

## 2016-02-14 DIAGNOSIS — Z5181 Encounter for therapeutic drug level monitoring: Secondary | ICD-10-CM

## 2016-02-14 DIAGNOSIS — K219 Gastro-esophageal reflux disease without esophagitis: Secondary | ICD-10-CM

## 2016-02-14 DIAGNOSIS — I714 Abdominal aortic aneurysm, without rupture, unspecified: Secondary | ICD-10-CM

## 2016-02-14 DIAGNOSIS — N390 Urinary tract infection, site not specified: Secondary | ICD-10-CM

## 2016-02-14 DIAGNOSIS — E785 Hyperlipidemia, unspecified: Secondary | ICD-10-CM | POA: Diagnosis not present

## 2016-02-14 DIAGNOSIS — M25511 Pain in right shoulder: Secondary | ICD-10-CM | POA: Insufficient documentation

## 2016-02-14 DIAGNOSIS — E118 Type 2 diabetes mellitus with unspecified complications: Secondary | ICD-10-CM | POA: Diagnosis not present

## 2016-02-14 DIAGNOSIS — L989 Disorder of the skin and subcutaneous tissue, unspecified: Secondary | ICD-10-CM

## 2016-02-14 LAB — COMPREHENSIVE METABOLIC PANEL
ALBUMIN: 5 g/dL (ref 3.5–5.2)
ALT: 61 U/L — ABNORMAL HIGH (ref 0–35)
AST: 52 U/L — AB (ref 0–37)
Alkaline Phosphatase: 35 U/L — ABNORMAL LOW (ref 39–117)
BILIRUBIN TOTAL: 0.6 mg/dL (ref 0.2–1.2)
BUN: 23 mg/dL (ref 6–23)
CALCIUM: 9.9 mg/dL (ref 8.4–10.5)
CHLORIDE: 104 meq/L (ref 96–112)
CO2: 23 mEq/L (ref 19–32)
CREATININE: 0.85 mg/dL (ref 0.40–1.20)
GFR: 70.01 mL/min (ref >60.00)
Glucose, Bld: 105 mg/dL — ABNORMAL HIGH (ref 70–99)
Potassium: 3.8 mEq/L (ref 3.5–5.1)
SODIUM: 139 meq/L (ref 135–145)
TOTAL PROTEIN: 7.9 g/dL (ref 6.0–8.3)

## 2016-02-14 LAB — LIPID PANEL
CHOL/HDL RATIO: 8
CHOLESTEROL: 224 mg/dL — AB (ref 0–200)
HDL: 29.5 mg/dL — ABNORMAL LOW (ref >39.00)
NonHDL: 194.24
TRIGLYCERIDES: 235 mg/dL — AB (ref 0.0–149.0)
VLDL: 47 mg/dL — ABNORMAL HIGH (ref 0.0–40.0)

## 2016-02-14 LAB — HEMOGLOBIN A1C: Hgb A1c MFr Bld: 7.3 % — ABNORMAL HIGH (ref 4.6–6.5)

## 2016-02-14 LAB — LDL CHOLESTEROL, DIRECT: Direct LDL: 133 mg/dL

## 2016-02-14 IMAGING — DX DG SHOULDER 2+V*R*
3 series · 3 of 3 positions shown · non-contrast
Comparison: None.

CLINICAL DATA: Right shoulder pain for 1 month.

EXAM:
RIGHT SHOULDER - 2+ VIEW

[shoulder grashey]
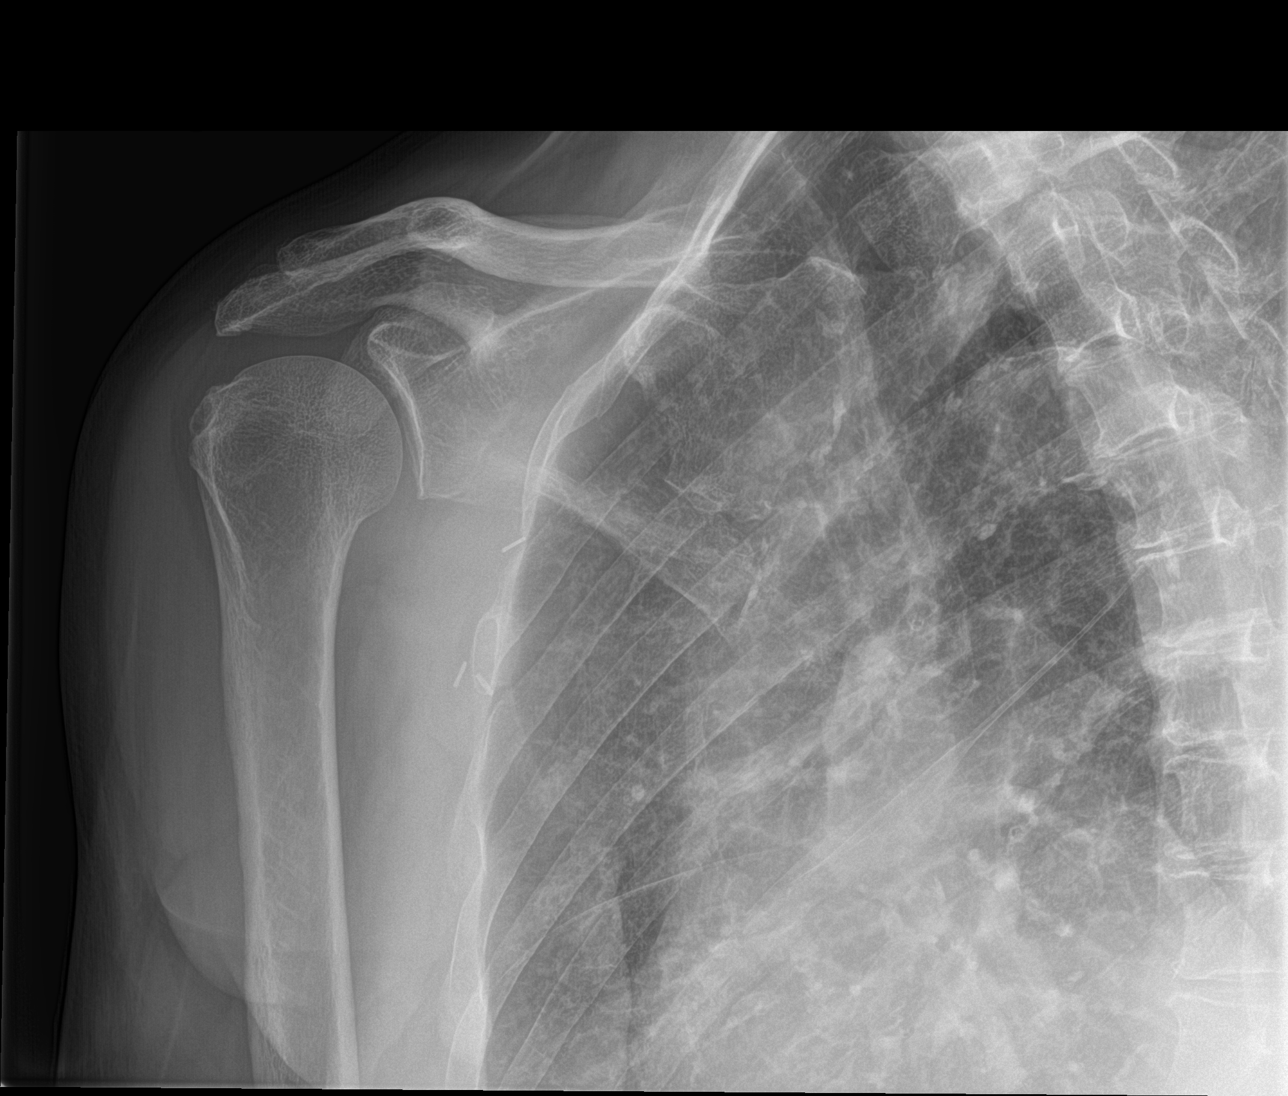

[shoulder y view]
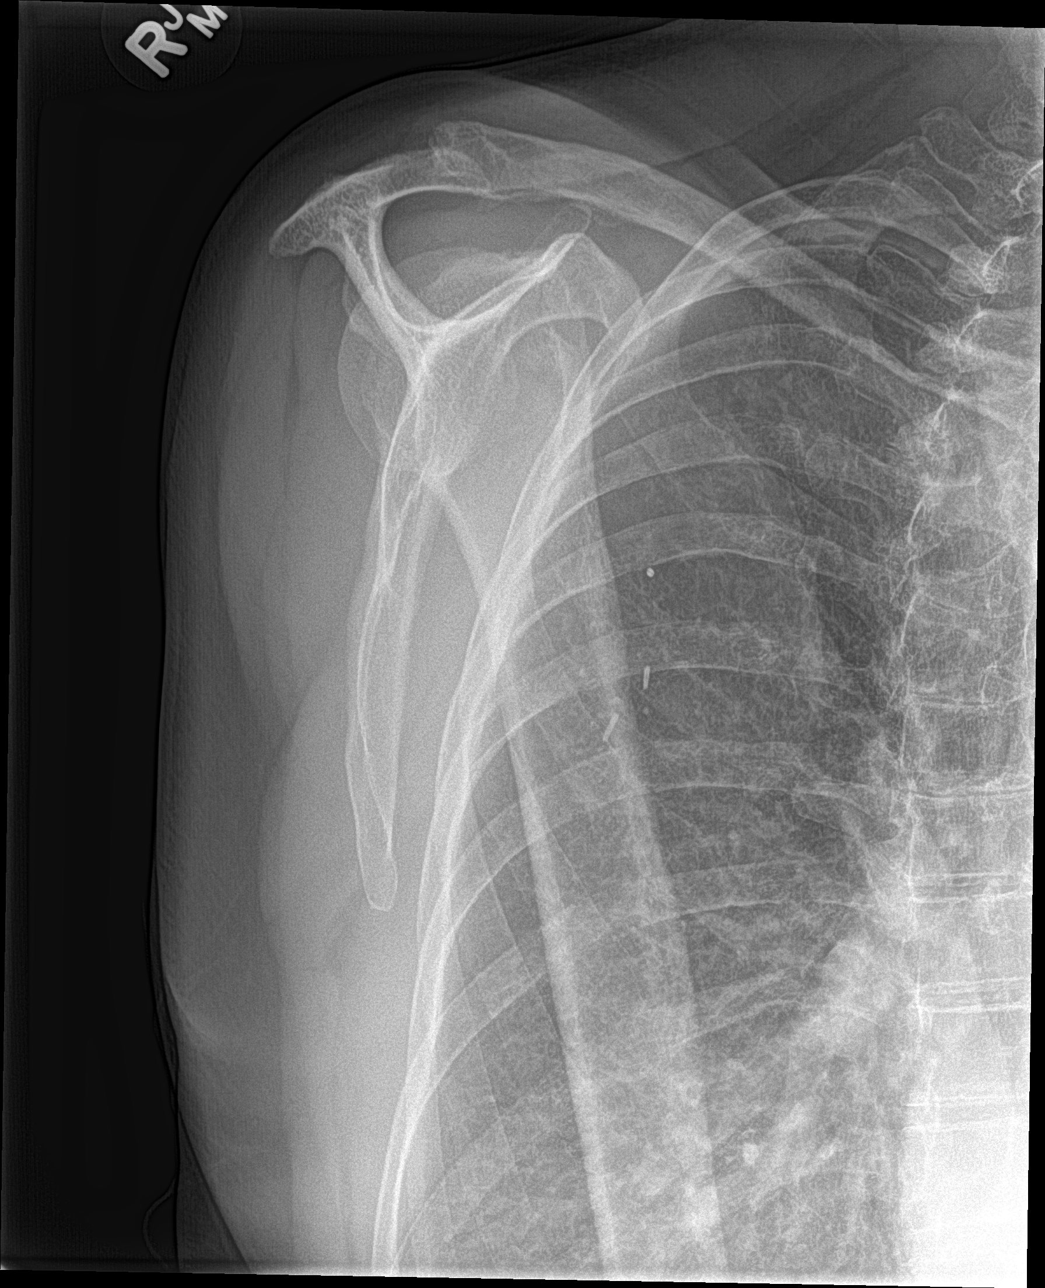

[shoulder axillary]
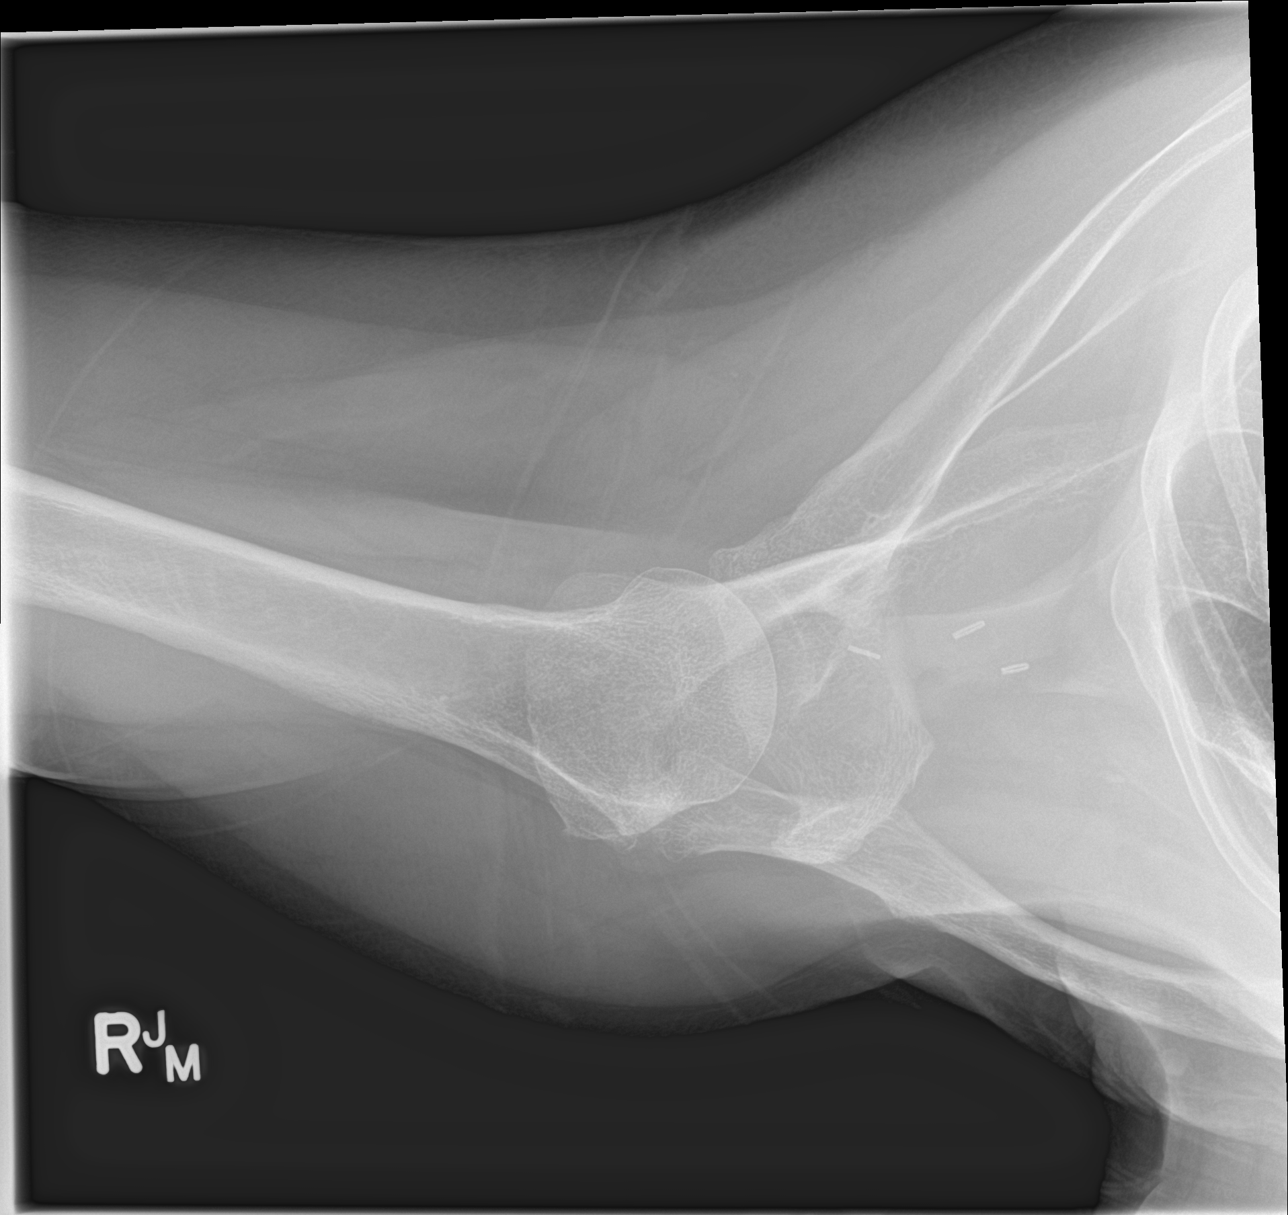

[3 of 3 positions shown; findings below may reference images not displayed]

FINDINGS: Right shoulder is located. Mild degenerative changes are noted at
the AC joint. No acute or focal abnormality is present. Surgical
clips are present in the axilla. The visualized right hemi thorax is
clear.
IMPRESSION: 1. Degenerative changes at the right AC joint.
2. No acute abnormality.
3. Surgical clips of the right axilla.

## 2016-02-14 MED ORDER — METFORMIN HCL 1000 MG PO TABS: ORAL_TABLET | ORAL | Status: DC

## 2016-02-14 MED ORDER — CLOPIDOGREL BISULFATE 75 MG PO TABS: 75.0000 mg | ORAL_TABLET | Freq: Every day | ORAL | Status: DC

## 2016-02-14 MED ORDER — PANTOPRAZOLE SODIUM 40 MG PO TBEC: DELAYED_RELEASE_TABLET | ORAL | Status: DC

## 2016-02-14 MED ORDER — GEMFIBROZIL 600 MG PO TABS: ORAL_TABLET | ORAL | Status: DC

## 2016-02-14 MED ORDER — POTASSIUM CHLORIDE CRYS ER 20 MEQ PO TBCR: 20.0000 meq | EXTENDED_RELEASE_TABLET | Freq: Every day | ORAL | Status: DC

## 2016-02-14 NOTE — Progress Notes (Addendum)
[place] at [facility] [address], [place], [place] [address] [phone]  Date:  02/14/2016   Name:  Mandy Kim   DOB:  [birth year]   MRN:  [id number]  PCP:  Lamar Blinks, MD    Chief Complaint: Follow-up   History of Present Illness:  Mandy Kim is a 70 y.o. very pleasant female patient who presents with the following:  Here today to follow-up on her DM.  Most recent labs in September of last year History of DM, hyperlipidemia, breast cancer. We have had a lot of difficulty with her triglycerides. At last check they were still high but a lot better with OTC niacin and gemfibrozil DM controlled with metformin currently; She stopped her insulin in January.  She went on a diet and has gotten great glucose readings at home.  She is still on metformin once a day.  She is doing nutrasystem that she buys at [employer] She is fasting today for labs  Lab Results  Component Value Date   HGBA1C 7.5* 08/18/2015   She has had both of her pneumonia vaccines.   She has noted right shoulder pain for a month or so.  She has pain all day- more at night.  Her ROM is still normal but she is having to force herself to maintain her range- it hurts  She is not taking any medication for her shoulder She is putting it in a sling sometimes- this feels good but then her shoulder will be stiffer She did have right sided breast cancer surgery with axillary node dissection biofreenze does help her shoulder  She needs a few referrals to her urologist, opthalmologist , vascular surgeon to monitor her AAA today She also has noticed a couple of skin lesion on her face and would like to see derm   Patient Active Problem List   Diagnosis Date Noted  . Peripheral neuropathy ([place]) 07/27/2014  . Other and unspecified hyperlipidemia 02/12/2012  . AAA (abdominal aortic aneurysm) ([place]) 02/12/2012  . Fatty liver 02/12/2012  . Breast cancer ([place])  02/12/2012  . Cervical cancer ([place]) 02/12/2012  . Diabetes mellitus 02/12/2012  . Hyperlipidemia 02/12/2012    Past Medical History  Diagnosis Date  . Chronic kidney disease   . Hypertension   . Hyperlipidemia   . Diabetes mellitus   . Cancer ([place])   . Allergy   . GERD (gastroesophageal reflux disease)   . Cataract   . Diverticulitis   . Sleep apnea     has c pap machine    Past Surgical History  Procedure Laterality Date  . Abdominal aortic aneurysm repair    . Mastectomy    . Tonsillectomy    . Bladder tacking    . Cataract extraction, bilateral    . Abdominal aortic aneurysm repair    . Abdominal hysterectomy  2000  . Breast surgery    . Eye surgery      Social History  Substance Use Topics  . Smoking status: Former Smoker -- 30 years    Types: Cigarettes    Quit date: 06/07/1999  . Smokeless tobacco: None  . Alcohol Use: None    Family History  Problem Relation Age of Onset  . Cancer Mother   . Pneumonia Mother   . Thyroid disease Mother   . Thyroid disease Sister   . Obesity Daughter   . Hypertension Daughter   . Thyroid disease Son   .  Pneumonia Maternal Grandmother   . Heart attack Maternal Grandfather     Allergies  Allergen Reactions  . Aspirin     Per pt any pain medications  . Crestor [Rosuvastatin Calcium]   . Lipitor [Atorvastatin Calcium]   . Pravastatin   . Simvastatin     Medication list has been reviewed and updated.  Current Outpatient Prescriptions on File Prior to Visit  Medication Sig Dispense Refill  . Alcohol Swabs PADS Use to test blood sugar 3 times daily. Dx: E11.9 300 each 2  . b complex vitamins tablet Take 1 tablet by mouth daily.    . Biotin 1000 MCG tablet Take 1,000 mcg by mouth daily.    . Blood Glucose Calibration (GLUCOSE CONTROL) SOLN Use to test blood sugar 3 times daily. Dx: E11.9 1 each 2  . blood glucose meter kit and supplies Use to test blood sugar 3 times daily. Dx: E11.9 1 each 0  . calcium  citrate-vitamin D 200-200 MG-UNIT TABS Take 1 tablet by mouth 2 (two) times daily.    . cholecalciferol (VITAMIN D) 1000 UNITS tablet Take 1,000 Units by mouth daily.    . clopidogrel (PLAVIX) 75 MG tablet TAKE 1 TABLET BY MOUTH EVERY DAY 90 tablet 3  . fish oil-omega-3 fatty acids 1000 MG capsule Take 1-2 g by mouth 2 (two) times daily. Patient takes 1 tablet in the morning and 2 tablets at bedtime    . gemfibrozil (LOPID) 600 MG tablet TAKE 1 TABLET (600 MG TOTAL) BY MOUTH 2 (TWO) TIMES DAILY BEFORE A MEAL. 180 tablet 0  . Glucose Blood (BLOOD GLUCOSE TEST STRIPS) STRP Use to test blood sugar 3 times daily. Dx: E11.9 300 each 0  . Insulin Glargine (LANTUS SOLOSTAR) 100 UNIT/ML Solostar Pen Inject 20 units into the skin daily. Dx: E11.9 30 mL 0  . Insulin Pen Needle 31G X 6 MM MISC Use daily with lantus pen.  Ok to substitute other size of pen needle if necessary. Dx: E11.9 100 each 2  . KLOR-CON M20 20 MEQ tablet TAKE 1 TABLET BY MOUTH EVERY DAY 90 tablet 0  . Lancets MISC Use to test blood sugar 3 times daily. Dx: E11.9 300 each 2  . metFORMIN (GLUCOPHAGE) 1000 MG tablet TAKE 1 TABLET (500 MG TOTAL) BY MOUTH 2 (TWO) TIMES DAILY WITH A MEAL. 180 tablet 3  . Multiple Vitamin (MULTIVITAMIN) tablet Take 0.5 tablets by mouth 2 (two) times daily.     . niacin (NIASPAN) 500 MG CR tablet Take 1 tablet (500 mg total) by mouth at bedtime. 30 tablet 3  . pantoprazole (PROTONIX) 40 MG tablet TAKE 1 TABLET BY MOUTH EVERY DAY ON AN EMPTY STOMACH 90 tablet 0  . pyridOXINE (VITAMIN B-6) 100 MG tablet Take 100 mg by mouth daily.     No current facility-administered medications on file prior to visit.    Review of Systems:  As per HPI- otherwise negative.   Physical Examination: Filed Vitals:   02/14/16 1311  BP: 148/58  Pulse: 57  Temp: 97.6 F (36.4 C)   Filed Vitals:   02/14/16 1311  Height: 5' 4"  (1.626 m)  Weight: 171 lb 6.4 oz (77.747 kg)   Body mass index is 29.41 kg/(m^2). Ideal Body  Weight: Weight in (lb) to have BMI = 25: 145.3  GEN: WDWN, NAD, Non-toxic, A & O x 3, looks well, overweight but has lost   HEENT: Atraumatic, Normocephalic. Neck supple. No masses, No LAD.  Bilateral TM  wnl, oropharynx normal.  PEERL,EOMI.   Ears and Nose: No external deformity. CV: RRR, No M/G/R. No JVD. No thrill. No extra heart sounds. PULM: CTA B, no wheezes, crackles, rhonchi. No retractions. No resp. distress. No accessory muscle use. ABD: S, NT, ND, +BS. No rebound. No HSM. EXTR: No c/c/e NEURO Normal gait.  PSYCH: Normally interactive. Conversant. Not depressed or anxious appearing.  Calm demeanor. Right shoulder: she is tender over the right anterior shoulder but has full ROM and strength.  Negative empty can testing     Assessment and Plan: Controlled type 2 diabetes mellitus without complication, without long-term current use of insulin ([place]) - Plan: Comprehensive metabolic panel, Hemoglobin A1c  Dyslipidemia - Plan: Lipid panel, gemfibrozil (LOPID) 600 MG tablet  Right shoulder pain - Plan: DG Shoulder Right  Type 2 diabetes mellitus with complication, without long-term current use of insulin ([place]) - Plan: metFORMIN (GLUCOPHAGE) 1000 MG tablet, Ambulatory referral to Ophthalmology  History of AAA (abdominal aortic aneurysm) repair - Plan: clopidogrel (PLAVIX) 75 MG tablet  Gastroesophageal reflux disease without esophagitis - Plan: pantoprazole (PROTONIX) 40 MG tablet  Medication monitoring encounter - Plan: potassium chloride SA (KLOR-CON M20) 20 MEQ tablet  Skin lesion - Plan: Ambulatory referral to Dermatology  AAA (abdominal aortic aneurysm) without rupture ([place]) - Plan: Ambulatory referral to Vascular Surgery  Chronic UTI - Plan: Ambulatory referral to Urology  Labs pending today- she has altered her DM regimen but is on a strict diet so we hope she will be looking ok Referrals as needed We are also interested to see her cholesterol on her new diet Refilled  medications See patient instructions for more details.   Will await her shoulder films and decide on next move  Signed Lamar Blinks, MD  Called 3/14- she does have some degenerative changes of her shoulder- will refer to ortho Results for orders placed or performed in visit on 02/14/16  Comprehensive metabolic panel  Result Value Ref Range   Sodium 139 135 - 145 mEq/L   Potassium 3.8 3.5 - 5.1 mEq/L   Chloride 104 96 - 112 mEq/L   CO2 23 19 - 32 mEq/L   Glucose, Bld 105 (H) 70 - 99 mg/dL   BUN 23 6 - 23 mg/dL   Creatinine, Ser 0.85 0.40 - 1.20 mg/dL   Total Bilirubin 0.6 0.2 - 1.2 mg/dL   Alkaline Phosphatase 35 (L) 39 - 117 U/L   AST 52 (H) 0 - 37 U/L   ALT 61 (H) 0 - 35 U/L   Total Protein 7.9 6.0 - 8.3 g/dL   Albumin 5.0 3.5 - 5.2 g/dL   Calcium 9.9 8.4 - 10.5 mg/dL   GFR 70.01 >60.00 mL/min  Lipid panel  Result Value Ref Range   Cholesterol 224 (H) 0 - 200 mg/dL   Triglycerides 235.0 (H) 0.0 - 149.0 mg/dL   HDL 29.50 (L) >39.00 mg/dL   VLDL 47.0 (H) 0.0 - 40.0 mg/dL   Total CHOL/HDL Ratio 8    NonHDL 194.24   Hemoglobin A1c  Result Value Ref Range   Hgb A1c MFr Bld 7.3 (H) 4.6 - 6.5 %  LDL cholesterol, direct  Result Value Ref Range   Direct LDL 133.0 mg/dL   Her DM is under ok control but would like to increase her metformin to BID since she is off the lantus.  Her triglycerides have been as high as 700 in the past so she has made good progress.  She has some flushing  already with niaspan and does not want to increase her dose.  She cannot tolerate statins. This may be about the best we can do within the limitations of her statin intolerance. Will plan to see her in 6 months and she will continue her weight loss efforts

## 2016-02-14 NOTE — Patient Instructions (Addendum)
It was good to see you today- I will be in touch with your labs I hope that your A1c and cholesterol will look good today!  Wt Readings from Last 3 Encounters:  02/14/16 171 lb 6.4 oz (77.747 kg)  12/21/15 183 lb (83.008 kg)  08/18/15 186 lb (84.369 kg)   You have lost weight with your diet!  Great job so far We will take x-rays of your shoulder today- I will be in touch asap.  Go downstairs for the x-ray after labs I also did your referrals today as you requested  We will decide if you might need to see PT or possibly orthopedics for your shoulder pain

## 2016-02-15 ENCOUNTER — Other Ambulatory Visit: Payer: Self-pay | Admitting: Family Medicine

## 2016-02-15 NOTE — Addendum Note (Signed)
Addended by: Lamar Blinks C on: 02/15/2016 01:23 PM   Modules accepted: Orders

## 2016-02-16 ENCOUNTER — Ambulatory Visit: Payer: Commercial Managed Care - HMO | Admitting: Family Medicine

## 2016-02-17 ENCOUNTER — Encounter: Payer: Self-pay | Admitting: Family Medicine

## 2016-02-17 NOTE — Addendum Note (Signed)
Addended by: Lamar Blinks C on: 02/17/2016 12:44 PM   Modules accepted: Orders

## 2016-03-01 DIAGNOSIS — L570 Actinic keratosis: Secondary | ICD-10-CM | POA: Diagnosis not present

## 2016-03-01 DIAGNOSIS — D485 Neoplasm of uncertain behavior of skin: Secondary | ICD-10-CM | POA: Diagnosis not present

## 2016-03-01 DIAGNOSIS — Z85828 Personal history of other malignant neoplasm of skin: Secondary | ICD-10-CM | POA: Diagnosis not present

## 2016-03-09 DIAGNOSIS — L570 Actinic keratosis: Secondary | ICD-10-CM | POA: Diagnosis not present

## 2016-03-23 DIAGNOSIS — M25511 Pain in right shoulder: Secondary | ICD-10-CM | POA: Diagnosis not present

## 2016-03-23 DIAGNOSIS — M7541 Impingement syndrome of right shoulder: Secondary | ICD-10-CM | POA: Diagnosis not present

## 2016-04-19 ENCOUNTER — Telehealth: Payer: Self-pay | Admitting: Family Medicine

## 2016-04-19 ENCOUNTER — Other Ambulatory Visit: Payer: Self-pay | Admitting: Emergency Medicine

## 2016-04-19 ENCOUNTER — Other Ambulatory Visit: Payer: Self-pay | Admitting: Family Medicine

## 2016-04-19 MED ORDER — GLUCOSE BLOOD VI STRP: ORAL_STRIP | Status: DC

## 2016-04-19 NOTE — Telephone Encounter (Signed)
Pt's need a another Rx for her Glucose Blood   Pharmacy: [address] [place], [place] [address] [place]: [phone]

## 2016-04-28 ENCOUNTER — Other Ambulatory Visit: Payer: Self-pay | Admitting: Physician Assistant

## 2016-05-15 ENCOUNTER — Other Ambulatory Visit: Payer: Self-pay | Admitting: Emergency Medicine

## 2016-05-15 ENCOUNTER — Other Ambulatory Visit: Payer: Self-pay | Admitting: Family Medicine

## 2016-05-15 DIAGNOSIS — K219 Gastro-esophageal reflux disease without esophagitis: Secondary | ICD-10-CM

## 2016-05-15 MED ORDER — PANTOPRAZOLE SODIUM 40 MG PO TBEC: DELAYED_RELEASE_TABLET | ORAL | Status: DC

## 2016-05-15 NOTE — Telephone Encounter (Signed)
Refill sent for Pantoprazole to [facility].

## 2016-06-09 ENCOUNTER — Other Ambulatory Visit: Payer: Self-pay | Admitting: Family Medicine

## 2016-06-09 ENCOUNTER — Telehealth: Payer: Self-pay | Admitting: Family Medicine

## 2016-06-09 MED ORDER — GLUCOSE BLOOD VI STRP: ORAL_STRIP | Status: DC

## 2016-06-09 NOTE — Telephone Encounter (Signed)
Relation to [id number] Call back [place]: [place], [place] [phone] (Phone) [phone] (Fax)         Reason for call:  Patient requesting PCP to renew TRUE METRIX BLOOD GLUCOSE TEST test strips for a year with [facility]

## 2016-06-09 NOTE — Telephone Encounter (Signed)
Rx sent 

## 2016-08-16 ENCOUNTER — Ambulatory Visit: Payer: Commercial Managed Care - HMO | Admitting: Family Medicine

## 2016-09-05 ENCOUNTER — Telehealth: Payer: Self-pay | Admitting: Family Medicine

## 2016-09-05 DIAGNOSIS — Z85828 Personal history of other malignant neoplasm of skin: Secondary | ICD-10-CM

## 2016-09-05 DIAGNOSIS — Z8679 Personal history of other diseases of the circulatory system: Secondary | ICD-10-CM

## 2016-09-05 NOTE — Telephone Encounter (Signed)
Patient needs a referral for Dr. Elvera Lennox at [facility] for her skin cancer and a referral for Dr. Terrial Rhodes with [facility] in [facility] for her aortic aneurism check up. Please advise.   Patient Relation: Self  Patient Phone: [phone]

## 2016-09-05 NOTE — Telephone Encounter (Signed)
Called and let her know that referrals are done

## 2016-09-07 ENCOUNTER — Ambulatory Visit (INDEPENDENT_AMBULATORY_CARE_PROVIDER_SITE_OTHER): Payer: Commercial Managed Care - HMO | Admitting: Family Medicine

## 2016-09-07 ENCOUNTER — Encounter: Payer: Self-pay | Admitting: Family Medicine

## 2016-09-07 VITALS — BP 112/64 | HR 65 | Temp 98.0°F | Ht 64.0 in | Wt 180.4 lb

## 2016-09-07 DIAGNOSIS — Z5181 Encounter for therapeutic drug level monitoring: Secondary | ICD-10-CM

## 2016-09-07 DIAGNOSIS — R635 Abnormal weight gain: Secondary | ICD-10-CM

## 2016-09-07 DIAGNOSIS — I10 Essential (primary) hypertension: Secondary | ICD-10-CM | POA: Diagnosis not present

## 2016-09-07 DIAGNOSIS — E119 Type 2 diabetes mellitus without complications: Secondary | ICD-10-CM

## 2016-09-07 DIAGNOSIS — E785 Hyperlipidemia, unspecified: Secondary | ICD-10-CM | POA: Diagnosis not present

## 2016-09-07 LAB — CBC
HCT: 39.1 % (ref 36.0–46.0)
Hemoglobin: 13.3 g/dL (ref 12.0–15.0)
MCHC: 34.1 g/dL (ref 30.0–36.0)
MCV: 86.3 fl (ref 78.0–100.0)
Platelets: 295 10*3/uL (ref 150.0–400.0)
RBC: 4.53 Mil/uL (ref 3.87–5.11)
RDW: 15.1 % (ref 11.5–15.5)
WBC: 6.3 10*3/uL (ref 4.0–10.5)

## 2016-09-07 LAB — MICROALBUMIN / CREATININE URINE RATIO
CREATININE, U: 73.7 mg/dL
MICROALB UR: 2 mg/dL — AB (ref 0.0–1.9)
MICROALB/CREAT RATIO: 2.7 mg/g (ref 0.0–30.0)

## 2016-09-07 LAB — LIPID PANEL
Cholesterol: 180 mg/dL (ref 0–200)
HDL: 26.1 mg/dL — AB (ref >39.00)
NONHDL: 154.16
Total CHOL/HDL Ratio: 7
Triglycerides: 336 mg/dL — ABNORMAL HIGH (ref 0.0–149.0)
VLDL: 67.2 mg/dL — AB (ref 0.0–40.0)

## 2016-09-07 LAB — COMPREHENSIVE METABOLIC PANEL
ALK PHOS: 35 U/L — AB (ref 39–117)
ALT: 55 U/L — AB (ref 0–35)
AST: 49 U/L — ABNORMAL HIGH (ref 0–37)
Albumin: 4.3 g/dL (ref 3.5–5.2)
BILIRUBIN TOTAL: 0.4 mg/dL (ref 0.2–1.2)
BUN: 19 mg/dL (ref 6–23)
CO2: 28 meq/L (ref 19–32)
Calcium: 9.2 mg/dL (ref 8.4–10.5)
Chloride: 104 mEq/L (ref 96–112)
Creatinine, Ser: 0.81 mg/dL (ref 0.40–1.20)
GFR: 73.9 mL/min (ref >60.00)
GLUCOSE: 143 mg/dL — AB (ref 70–99)
Potassium: 4.3 mEq/L (ref 3.5–5.1)
SODIUM: 141 meq/L (ref 135–145)
TOTAL PROTEIN: 7.6 g/dL (ref 6.0–8.3)

## 2016-09-07 LAB — TSH: TSH: 4.15 u[IU]/mL (ref 0.35–4.50)

## 2016-09-07 LAB — LDL CHOLESTEROL, DIRECT: LDL DIRECT: 100 mg/dL

## 2016-09-07 LAB — HEMOGLOBIN A1C: HEMOGLOBIN A1C: 7.3 % — AB (ref 4.6–6.5)

## 2016-09-07 NOTE — Progress Notes (Signed)
Pre visit review using our clinic review tool, if applicable. No additional management support is needed unless otherwise documented below in the visit note. 

## 2016-09-07 NOTE — Progress Notes (Addendum)
[place] [facility] [address] [place], [place] [address] [phone]  Date:  09/07/2016   Name:  Mandy Kim   DOB:  01-20-1946   MRN:  [id number]  PCP:  Lamar Blinks, MD    Chief Complaint: No chief complaint on file.   History of Present Illness:  Mandy Kim is a 70 y.o. very pleasant female patient who presents with the following:  Last seen by myself 6 months ago for labs and follow-up- see OV from that visit  Here today to follow-up on her DM.  Most recent labs in September of last year History of DM, hyperlipidemia, breast cancer. We have had a lot of difficulty with her triglycerides. At last check they were still high but a lot better with OTC niacin and gemfibrozil DM controlled with metformin currently; She stopped her insulin in January.  She went on a diet and has gotten great glucose readings at home.  She is still on metformin once a day.  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   Declines flu shot today She has a derm appt for a skin lesion - appt is next week.   She is fasting today for labs Her blood sugar has gone up- she was visiting her husbands mother for about 2 months and went off her diet.  However her home glucose readings have not gotten over about 165  Wt Readings from Last 3 Encounters:  09/07/16 180 lb 6.4 oz (81.8 kg)  02/14/16 171 lb 6.4 oz (77.7 kg)  12/21/15 183 lb (83 kg)   BP Readings from Last 3 Encounters:  09/07/16 (!) 140/52  02/14/16 140/70  12/21/15 118/70     Patient Active Problem List   Diagnosis Date Noted  . Peripheral neuropathy ([place]) 07/27/2014  . Other and unspecified hyperlipidemia 02/12/2012  . AAA (abdominal aortic aneurysm) ([place]) 02/12/2012  .  Fatty liver 02/12/2012  . Breast cancer ([place]) 02/12/2012  . Cervical cancer ([place]) 02/12/2012  . Diabetes mellitus 02/12/2012  . Hyperlipidemia 02/12/2012    Past Medical History:  Diagnosis Date  . Allergy   . Cancer ([place])   . Cataract   . Chronic kidney disease   . Diabetes mellitus   . Diverticulitis   . GERD (gastroesophageal reflux disease)   . Hyperlipidemia   . Hypertension   . Sleep apnea    has c pap machine    Past Surgical History:  Procedure Laterality Date  . ABDOMINAL AORTIC ANEURYSM REPAIR    . ABDOMINAL AORTIC ANEURYSM REPAIR    . ABDOMINAL HYSTERECTOMY  2000  . Bladder tacking    . BREAST SURGERY    . CATARACT EXTRACTION, BILATERAL    . EYE SURGERY    . MASTECTOMY    . TONSILLECTOMY      Social History  Substance Use Topics  . Smoking status: Former Smoker    Years: 30.00    Types: Cigarettes    Quit date: 06/07/1999  . Smokeless tobacco: Not on file  . Alcohol use Not on file    Family History  Problem Relation Age of Onset  . Cancer Mother   . Pneumonia Mother   . Thyroid disease Mother   .  Thyroid disease Sister   . Obesity Daughter   . Hypertension Daughter   . Thyroid disease Son   . Pneumonia Maternal Grandmother   . Heart attack Maternal Grandfather     Allergies  Allergen Reactions  . Aspirin     Per pt any pain medications  . Crestor [Rosuvastatin Calcium]   . Lipitor [Atorvastatin Calcium]   . Pravastatin   . Simvastatin     Medication list has been reviewed and updated.  Current Outpatient Prescriptions on File Prior to Visit  Medication Sig Dispense Refill  . Alcohol Swabs PADS Use to test blood sugar 3 times daily. Dx: E11.9 300 each 2  . b complex vitamins tablet Take 1 tablet by mouth daily.    . Biotin 1000 MCG tablet Take 1,000 mcg by mouth daily.    . Blood Glucose Calibration (GLUCOSE CONTROL) SOLN Use to test blood sugar 3 times daily. Dx: E11.9 1 each 2  . Blood Glucose Monitoring Suppl (TRUE METRIX AIR  GLUCOSE METER) w/Device KIT USE  TO TEST BLOOD SUGAR THREE TIMES DAILY 1 kit 0  . calcium citrate-vitamin D 200-200 MG-UNIT TABS Take 1 tablet by mouth 2 (two) times daily.    . cholecalciferol (VITAMIN D) 1000 UNITS tablet Take 1,000 Units by mouth daily.    . clopidogrel (PLAVIX) 75 MG tablet Take 1 tablet (75 mg total) by mouth daily. 90 tablet 3  . fish oil-omega-3 fatty acids 1000 MG capsule Take 1-2 g by mouth 2 (two) times daily. Patient takes 1 tablet in the morning and 2 tablets at bedtime    . gemfibrozil (LOPID) 600 MG tablet TAKE 1 TABLET (600 MG TOTAL) BY MOUTH 2 (TWO) TIMES DAILY BEFORE A MEAL. 180 tablet 3  . Insulin Pen Needle 31G X 6 MM MISC Use daily with lantus pen.  Ok to substitute other size of pen needle if necessary. Dx: E11.9 100 each 2  . metFORMIN (GLUCOPHAGE) 1000 MG tablet TAKE 1 TABLET (500 MG TOTAL) BY MOUTH 2 (TWO) TIMES DAILY WITH A MEAL. 180 tablet 3  . Multiple Vitamin (MULTIVITAMIN) tablet Take 0.5 tablets by mouth 2 (two) times daily.     . niacin (NIASPAN) 500 MG CR tablet Take 1 tablet (500 mg total) by mouth at bedtime. 30 tablet 3  . pantoprazole (PROTONIX) 40 MG tablet TAKE 1 TABLET BY MOUTH EVERY DAY ON AN EMPTY STOMACH 90 tablet 3  . potassium chloride SA (KLOR-CON M20) 20 MEQ tablet Take 1 tablet (20 mEq total) by mouth daily. 90 tablet 3  . pyridOXINE (VITAMIN B-6) 100 MG tablet Take 100 mg by mouth daily.    . TRUE METRIX BLOOD GLUCOSE TEST test strip USE AS DIRECTED TO TEST BLOOD SUGAR THREE TIMES DAILY 1 each 0  . TRUEPLUS LANCETS 33G MISC USE  TO TEST BLOOD SUGAR THREE TIMES DAILY 300 each 0   No current facility-administered medications on file prior to visit.     Review of Systems:  As per HPI- otherwise negative. No fever, chills, nausea, vomiting, diarrhea, rash  Physical Examination:  Vitals:   09/07/16 0917  BP: (!) 140/52  Pulse: 65  Temp: 98 F (36.7 C)     Vitals:   09/07/16 0917  Weight: 180 lb 6.4 oz (81.8 kg)  Height:  _0  (1.626 m)  Body mass index is 30.97 kg/m.  Ideal Body Weight:    GEN: WDWN, NAD, Non-toxic, A & O x 3, overweight, otherwise looks well HEENT: Atraumatic,  Normocephalic. Neck supple. No masses, No LAD. Ears and Nose: No external deformity. CV: RRR, No M/G/R. No JVD. No thrill. No extra heart sounds. PULM: CTA B, no wheezes, crackles, rhonchi. No retractions. No resp. distress. No accessory muscle use. ABD: S, NT, ND. No rebound. No HSM. EXTR: No c/c/e NEURO Normal gait.  PSYCH: Normally interactive. Conversant. Not depressed or anxious appearing.  Calm demeanor.    Assessment and Plan: Controlled type 2 diabetes mellitus without complication, without long-term current use of insulin ([place]) - Plan: Urine Microalbumin w/creat. ratio, Hemoglobin A1C  Dyslipidemia - Plan: Lipid panel  Essential hypertension, benign  Medication monitoring encounter - Plan: CBC, Comprehensive metabolic panel  Weight gain - Plan: TSH  Here today for a recheck visit.  Await her labs and will give her a call.  She does not tolerate statins.  BP under good control  BP Readings from Last 3 Encounters:  09/07/16 112/64  02/14/16 140/70  12/21/15 118/70     Results for orders placed or performed in visit on 09/07/16  Urine Microalbumin w/creat. ratio  Result Value Ref Range   Microalb, Ur 2.0 (H) 0.0 - 1.9 mg/dL   Creatinine,U 73.7 mg/dL   Microalb Creat Ratio 2.7 0.0 - 30.0 mg/g  Hemoglobin A1C  Result Value Ref Range   Hgb A1c MFr Bld 7.3 (H) 4.6 - 6.5 %  CBC  Result Value Ref Range   WBC 6.3 4.0 - 10.5 K/uL   RBC 4.53 3.87 - 5.11 Mil/uL   Platelets 295.0 150.0 - 400.0 K/uL   Hemoglobin 13.3 12.0 - 15.0 g/dL   HCT 39.1 36.0 - 46.0 %   MCV 86.3 78.0 - 100.0 fl   MCHC 34.1 30.0 - 36.0 g/dL   RDW 15.1 11.5 - 15.5 %  Comprehensive metabolic panel  Result Value Ref Range   Sodium 141 135 - 145 mEq/L   Potassium 4.3 3.5 - 5.1 mEq/L   Chloride 104 96 - 112 mEq/L   CO2 28 19 - 32 mEq/L    Glucose, Bld 143 (H) 70 - 99 mg/dL   BUN 19 6 - 23 mg/dL   Creatinine, Ser 0.81 0.40 - 1.20 mg/dL   Total Bilirubin 0.4 0.2 - 1.2 mg/dL   Alkaline Phosphatase 35 (L) 39 - 117 U/L   AST 49 (H) 0 - 37 U/L   ALT 55 (H) 0 - 35 U/L   Total Protein 7.6 6.0 - 8.3 g/dL   Albumin 4.3 3.5 - 5.2 g/dL   Calcium 9.2 8.4 - 10.5 mg/dL   GFR 73.90 >60.00 mL/min  Lipid panel  Result Value Ref Range   Cholesterol 180 0 - 200 mg/dL   Triglycerides 336.0 (H) 0.0 - 149.0 mg/dL   HDL 26.10 (L) >39.00 mg/dL   VLDL 67.2 (H) 0.0 - 40.0 mg/dL   Total CHOL/HDL Ratio 7    NonHDL 154.16   TSH  Result Value Ref Range   TSH 4.15 0.35 - 4.50 uIU/mL  LDL cholesterol, direct  Result Value Ref Range   Direct LDL 100.0 mg/dL   Signed Lamar Blinks, MD  Called her 10/10.  She cannot tolerate statins and is taking her niacin and gemfibrozil.  Will continue these and she will work on cleaning up her diet.  Recheck in 3-4 months

## 2016-09-07 NOTE — Patient Instructions (Signed)
It was good to see you today- I will be in touch with your labs asap You have gained some weight back- please try to get back towards 170 again if you can!   Pending your labs let's plan to recheck in about 3 months

## 2016-09-08 ENCOUNTER — Encounter: Payer: Self-pay | Admitting: Family Medicine

## 2016-09-12 ENCOUNTER — Encounter: Payer: Self-pay | Admitting: Family Medicine

## 2016-09-13 DIAGNOSIS — Z85828 Personal history of other malignant neoplasm of skin: Secondary | ICD-10-CM | POA: Diagnosis not present

## 2016-09-13 DIAGNOSIS — L821 Other seborrheic keratosis: Secondary | ICD-10-CM | POA: Diagnosis not present

## 2016-09-13 DIAGNOSIS — C44629 Squamous cell carcinoma of skin of left upper limb, including shoulder: Secondary | ICD-10-CM | POA: Diagnosis not present

## 2016-09-13 DIAGNOSIS — C44619 Basal cell carcinoma of skin of left upper limb, including shoulder: Secondary | ICD-10-CM | POA: Diagnosis not present

## 2016-09-14 ENCOUNTER — Telehealth: Payer: Self-pay | Admitting: Emergency Medicine

## 2016-09-14 NOTE — Telephone Encounter (Signed)
Called pt and schedule appt for 01/15/17 for 3-4 month cholesterol recheck visit.

## 2016-09-14 NOTE — Telephone Encounter (Signed)
-----   Message from Darreld Mclean, MD sent at 09/12/2016  5:30 PM EDT ----- Please schedule her for a recheck in 3-4 months - thank you!

## 2016-09-20 DIAGNOSIS — Z48812 Encounter for surgical aftercare following surgery on the circulatory system: Secondary | ICD-10-CM | POA: Diagnosis not present

## 2016-09-20 DIAGNOSIS — I714 Abdominal aortic aneurysm, without rupture: Secondary | ICD-10-CM | POA: Diagnosis not present

## 2016-10-30 ENCOUNTER — Telehealth: Payer: Self-pay | Admitting: Family Medicine

## 2016-10-30 DIAGNOSIS — Z794 Long term (current) use of insulin: Principal | ICD-10-CM

## 2016-10-30 DIAGNOSIS — IMO0001 Reserved for inherently not codable concepts without codable children: Secondary | ICD-10-CM

## 2016-10-30 DIAGNOSIS — E1165 Type 2 diabetes mellitus with hyperglycemia: Principal | ICD-10-CM

## 2016-10-30 NOTE — Telephone Encounter (Signed)
Patient says she wants to get back on insulin since she has gained weight and feels she wants to start back. Offered the patient an appointment to come in to see you but patient preferred to talk with provider before scheduling

## 2016-10-30 NOTE — Telephone Encounter (Signed)
Patient requesting a refill on her insulin please    [phone]

## 2016-10-31 MED ORDER — INSULIN GLARGINE 100 UNIT/ML SOLOSTAR PEN: PEN_INJECTOR | SUBCUTANEOUS | 11 refills | Status: DC

## 2016-10-31 MED ORDER — PEN NEEDLES 32G X 6 MM MISC: 1.0000 | Freq: Every day | 3 refills | Status: DC

## 2016-10-31 NOTE — Telephone Encounter (Signed)
Called Mandy Kim- she reports that despite following her diet closely she is getting blood sugar readings in the 300s.  She is concerned and would like to go back on insulin glargine. Will start her at 10 units, increase by 2 units every 2 days as long as FBG is over 150  Will ask Tanesha to call her and schedule follow-up in the next couple of weeks   Meds ordered this encounter  Medications  . Insulin Glargine (LANTUS) 100 UNIT/ML Solostar Pen    Sig: Start with 10 units daily, increase by 2 units daily until fasting sugar is 150.  Max 20 units daily    Dispense:  15 mL    Refill:  11  . Insulin Pen Needle (PEN NEEDLES) 32G X 6 MM MISC    Sig: 1 each by Does not apply route daily.    Dispense:  100 each    Refill:  3

## 2016-11-01 NOTE — Telephone Encounter (Signed)
Called pt to scheduled 2-3 week diabetes follow up visit. Scheduled pt for 12/18 @  2:30pm.

## 2016-11-02 NOTE — Telephone Encounter (Signed)
You don't have to call the pt back. We will just see her on her scheduled appt date. Thanks

## 2016-11-02 NOTE — Telephone Encounter (Signed)
Would you like me to call patient and inform her of this message? The patient did not want a message to go back but I can inform her if you would like. Please advise.

## 2016-11-02 NOTE — Telephone Encounter (Signed)
Patient called stating that her insulin was not at the [facility]. I informed the patient that per her chart her mediation had been sent to [facility]. She stated that she would not get it for a month that way. I offered to send a message back to inform Dr. Lorelei Pont of this and see if it could get sent to [facility] instead. The patient told me to "stop talking" and said no she would just wait until she got it. She said she would be in for her appointment before she ever started taking the insulin. Please advise.

## 2016-11-02 NOTE — Telephone Encounter (Signed)
We are more than happy to [facility] if needed. If pt decides to wait on the [facility] order it will still be fine to see the pt on 12/18 even if they pt has not received the insulin, per Dr. Lorelei Pont,

## 2016-11-20 ENCOUNTER — Encounter: Payer: Self-pay | Admitting: Family Medicine

## 2016-11-20 ENCOUNTER — Ambulatory Visit (INDEPENDENT_AMBULATORY_CARE_PROVIDER_SITE_OTHER): Payer: Commercial Managed Care - HMO | Admitting: Family Medicine

## 2016-11-20 VITALS — BP 140/55 | HR 63 | Temp 98.0°F | Ht 64.0 in | Wt 181.2 lb

## 2016-11-20 DIAGNOSIS — E1142 Type 2 diabetes mellitus with diabetic polyneuropathy: Secondary | ICD-10-CM | POA: Diagnosis not present

## 2016-11-20 DIAGNOSIS — E1165 Type 2 diabetes mellitus with hyperglycemia: Secondary | ICD-10-CM | POA: Diagnosis not present

## 2016-11-20 DIAGNOSIS — Z794 Long term (current) use of insulin: Secondary | ICD-10-CM

## 2016-11-20 DIAGNOSIS — IMO0001 Reserved for inherently not codable concepts without codable children: Secondary | ICD-10-CM

## 2016-11-20 DIAGNOSIS — R740 Nonspecific elevation of levels of transaminase and lactic acid dehydrogenase [LDH]: Secondary | ICD-10-CM | POA: Diagnosis not present

## 2016-11-20 DIAGNOSIS — H6501 Acute serous otitis media, right ear: Secondary | ICD-10-CM

## 2016-11-20 DIAGNOSIS — R7401 Elevation of levels of liver transaminase levels: Secondary | ICD-10-CM

## 2016-11-20 MED ORDER — AMOXICILLIN 500 MG PO CAPS: 1000.0000 mg | ORAL_CAPSULE | Freq: Two times a day (BID) | ORAL | 0 refills | Status: DC

## 2016-11-20 MED ORDER — PREGABALIN 50 MG PO CAPS: 50.0000 mg | ORAL_CAPSULE | Freq: Three times a day (TID) | ORAL | 4 refills | Status: DC

## 2016-11-20 NOTE — Progress Notes (Addendum)
[place] at [facility] [address] [place], [place] [address] [phone]  Date:  11/20/2016   Name:  Mandy Kim   DOB:  [birth year]   MRN:  [id number]  PCP:  Lamar Blinks, MD    Chief Complaint: No chief complaint on file.   History of Present Illness:  Mandy Kim is a 70 y.o. very pleasant female patient who presents with the following:  Elevated LFTS- needs an acute hep panel, will order today .  Had liver US last year - fatty liver is presumed cause of her LFTs  Her glucose has been running 200- 250 recently; she is afraid that she is not getting her DM under control.  She is traveling to visit her husband's mother again for a couple of weeks over the holidays.   She is taking 500 metformin BID, and 14 units of lantus once a day Lab Results  Component Value Date   HGBA1C 7.3 (H) 09/07/2016   Her right ear seems to be clogged-this has been bothering her for about 1 month.  She has felt something coming out of the ear when she blows her nose and has had some no- bloody drainage from the ear.  Her hearing seems to be ok She has an eye check scheduled next month  She was on amittiptyline but stopped taking it years ago for peripheral neuropathy.  She does not have any loss of sensation but her feet will burn and tingle. She would like to try lyrica or another alternative drug for her foot discomfort  Patient Active Problem List   Diagnosis Date Noted  . Peripheral neuropathy ([place]) 07/27/2014  . Other and unspecified hyperlipidemia 02/12/2012  . AAA (abdominal aortic aneurysm) ([place]) 02/12/2012  . Fatty liver 02/12/2012  . Breast cancer ([place]) 02/12/2012  . Cervical cancer ([place]) 02/12/2012  . Diabetes mellitus 02/12/2012  . Hyperlipidemia 02/12/2012    Past Medical History:  Diagnosis Date  . Allergy   . Cancer ([place])   . Cataract   . Chronic kidney disease   . Diabetes mellitus   . Diverticulitis   .  GERD (gastroesophageal reflux disease)   . Hyperlipidemia   . Hypertension   . Sleep apnea    has c pap machine    Past Surgical History:  Procedure Laterality Date  . ABDOMINAL AORTIC ANEURYSM REPAIR    . ABDOMINAL AORTIC ANEURYSM REPAIR    . ABDOMINAL HYSTERECTOMY  2000  . Bladder tacking    . BREAST SURGERY    . CATARACT EXTRACTION, BILATERAL    . EYE SURGERY    . MASTECTOMY    . TONSILLECTOMY      Social History  Substance Use Topics  . Smoking status: Former Smoker    Years: 30.00    Types: Cigarettes    Quit date: 06/07/1999  . Smokeless tobacco: Not on file  . Alcohol use Not on file    Family History  Problem Relation Age of Onset  . Cancer Mother   . Pneumonia Mother   . Thyroid disease Mother   . Thyroid disease Sister   . Obesity Daughter   . Hypertension Daughter   . Thyroid disease Son   . Pneumonia Maternal Grandmother   . Heart attack Maternal Grandfather     Allergies  Allergen Reactions  . Aspirin     Per pt any pain medications  . Crestor [Rosuvastatin Calcium]   . Lipitor [employer]  Calcium]   . Pravastatin   . Simvastatin     Medication list has been reviewed and updated.  Current Outpatient Prescriptions on File Prior to Visit  Medication Sig Dispense Refill  . Alcohol Swabs PADS Use to test blood sugar 3 times daily. Dx: E11.9 300 each 2  . b complex vitamins tablet Take 1 tablet by mouth daily.    . Biotin 1000 MCG tablet Take 1,000 mcg by mouth daily.    . Blood Glucose Calibration (GLUCOSE CONTROL) SOLN Use to test blood sugar 3 times daily. Dx: E11.9 1 each 2  . Blood Glucose Monitoring Suppl (TRUE METRIX AIR GLUCOSE METER) w/Device KIT USE  TO TEST BLOOD SUGAR THREE TIMES DAILY 1 kit 0  . cholecalciferol (VITAMIN D) 1000 UNITS tablet Take 1,000 Units by mouth daily.    . clopidogrel (PLAVIX) 75 MG tablet Take 1 tablet (75 mg total) by mouth daily. 90 tablet 3  . gemfibrozil (LOPID) 600 MG tablet TAKE 1 TABLET (600 MG TOTAL)  BY MOUTH 2 (TWO) TIMES DAILY BEFORE A MEAL. 180 tablet 3  . Insulin Glargine (LANTUS) 100 UNIT/ML Solostar Pen Start with 10 units daily, increase by 2 units daily until fasting sugar is 150.  Max 20 units daily 15 mL 11  . Insulin Pen Needle (PEN NEEDLES) 32G X 6 MM MISC 1 each by Does not apply route daily. 100 each 3  . metFORMIN (GLUCOPHAGE) 1000 MG tablet TAKE 1 TABLET (500 MG TOTAL) BY MOUTH 2 (TWO) TIMES DAILY WITH A MEAL. 180 tablet 3  . Multiple Vitamin (MULTIVITAMIN) tablet Take 0.5 tablets by mouth 2 (two) times daily.     . niacin (NIASPAN) 500 MG CR tablet Take 1 tablet (500 mg total) by mouth at bedtime. 30 tablet 3  . pantoprazole (PROTONIX) 40 MG tablet TAKE 1 TABLET BY MOUTH EVERY DAY ON AN EMPTY STOMACH 90 tablet 3  . potassium chloride SA (KLOR-CON M20) 20 MEQ tablet Take 1 tablet (20 mEq total) by mouth daily. 90 tablet 3  . TRUE METRIX BLOOD GLUCOSE TEST test strip USE AS DIRECTED TO TEST BLOOD SUGAR THREE TIMES DAILY 1 each 0   No current facility-administered medications on file prior to visit.     Review of Systems:  As per HPI- otherwise negative. Wt Readings from Last 3 Encounters:  11/20/16 181 lb 3.2 oz (82.2 kg)  09/07/16 180 lb 6.4 oz (81.8 kg)  02/14/16 171 lb 6.4 oz (77.7 kg)      Physical Examination: Blood pressure (!) 140/55, pulse 63, temperature 98 F (36.7 C), temperature source Oral, height 5' 4"  (1.626 m), weight 181 lb 3.2 oz (82.2 kg), SpO2 94 %.  GEN: WDWN, NAD, Non-toxic, A & O x 3 HEENT: Atraumatic, Normocephalic. Neck supple. No masses, No LAD.  PEERL, left TM shows scarring. Right TM with scarring and a very small perforation at 5:00.  Per pt this perforation is not new.  The right TM is inflamed/ injected and the ear canal is damp presume from drainage from TM perforation Ears and Nose: No external deformity. CV: RRR, No M/G/R. No JVD. No thrill. No extra heart sounds. PULM: CTA B, no wheezes, crackles, rhonchi. No retractions. No resp.  distress. No accessory muscle use. EXTR: No c/c/e NEURO Normal gait.  PSYCH: Normally interactive. Conversant. Not depressed or anxious appearing.  Calm demeanor.  Obese, looks well.  S/o mastectomy  Assessment and Plan: Transaminitis - Plan: Hepatitis, Acute  Uncontrolled type 2 diabetes mellitus  without complication, with long-term current use of insulin ([place]) - Plan: Hemoglobin A1c  Diabetic polyneuropathy associated with type 2 diabetes mellitus ([place]) - Plan: pregabalin (LYRICA) 50 MG capsule  Right acute serous otitis media, recurrence not specified - Plan: amoxicillin (AMOXIL) 500 MG capsule, DISCONTINUED: amoxicillin (AMOXIL) 500 MG capsule  Here today to recheck her diabetes; she is concerned that her control is worsening and she is gaining weight.  We tried a GLP-1 agonist but it made her too sick (GI sx).  Will plan to add an SGLT2 if her A1c is still high and hopefully will be able to decrease her insulin which may be triggering weight gain Trial of lyrica for her foot pain Likely OM- will treat with amoxicillin See patient instructions for more details.   Meds ordered this encounter  Medications  . pregabalin (LYRICA) 50 MG capsule    Sig: Take 1 capsule (50 mg total) by mouth 3 (three) times daily.    Dispense:  90 capsule    Refill:  4  . DISCONTD: amoxicillin (AMOXIL) 500 MG capsule    Sig: Take 2 capsules (1,000 mg total) by mouth 2 (two) times daily.    Dispense:  40 capsule    Refill:  0  . amoxicillin (AMOXIL) 500 MG capsule    Sig: Take 2 capsules (1,000 mg total) by mouth 2 (two) times daily.    Dispense:  40 capsule    Refill:  0   Signed Lamar Blinks, MD  Results for orders placed or performed in visit on 11/20/16  Hepatitis, Acute  Result Value Ref Range   Hepatitis B Surface Ag NEGATIVE NEGATIVE   HCV Ab NEGATIVE NEGATIVE   Hep B C IgM NON REACTIVE NON REACTIVE   Hep A IgM NON REACTIVE NON REACTIVE  Hemoglobin A1c  Result Value Ref Range   Hgb  A1c MFr Bld 9.0 (H) 4.6 - 6.5 %    Called her and was able to reach on 12/27.  She is willing to add an SGLT2 inhibitor to her regiment to bring her glucose under better control. She is out of town but will pick up her new medication when she gets home in about one week. Creat clearance is over 80- ok to start .  Will use jardiance as it is preferred by her insurance company She has an appt in 4 months- will see her then to check her A1c.  Continue current dose of insulin

## 2016-11-20 NOTE — Patient Instructions (Addendum)
It was great to see you today- I will be in touch with your labs asap If your A1c is still high I will contact you and we may start a medication from the "SGLT2" class- these can also help with weight loss  Start on the lyrica (pregalalin) for your foot pain.  Start with 1 three times a day- we may need to increase your dose We are going to treat you for an ear infection with amoxicillin   Please let me know if you ear is not feeling better soon I will be in touch with further lab details

## 2016-11-21 LAB — HEPATITIS PANEL, ACUTE
HCV AB: NEGATIVE
HEP A IGM: NONREACTIVE
HEP B C IGM: NONREACTIVE
Hepatitis B Surface Ag: NEGATIVE

## 2016-11-21 LAB — HEMOGLOBIN A1C: Hgb A1c MFr Bld: 9 % — ABNORMAL HIGH (ref 4.6–6.5)

## 2016-11-29 MED ORDER — EMPAGLIFLOZIN 10 MG PO TABS: 10.0000 mg | ORAL_TABLET | Freq: Every day | ORAL | 6 refills | Status: DC

## 2016-11-29 NOTE — Addendum Note (Signed)
Addended by: Lamar Blinks C on: 11/29/2016 08:06 PM   Modules accepted: Orders

## 2016-12-26 ENCOUNTER — Encounter: Payer: Self-pay | Admitting: Family Medicine

## 2016-12-26 DIAGNOSIS — H04123 Dry eye syndrome of bilateral lacrimal glands: Secondary | ICD-10-CM | POA: Diagnosis not present

## 2016-12-26 DIAGNOSIS — H43811 Vitreous degeneration, right eye: Secondary | ICD-10-CM | POA: Diagnosis not present

## 2016-12-26 DIAGNOSIS — Z961 Presence of intraocular lens: Secondary | ICD-10-CM | POA: Diagnosis not present

## 2016-12-26 DIAGNOSIS — E119 Type 2 diabetes mellitus without complications: Secondary | ICD-10-CM | POA: Diagnosis not present

## 2016-12-26 DIAGNOSIS — H26492 Other secondary cataract, left eye: Secondary | ICD-10-CM | POA: Diagnosis not present

## 2016-12-26 LAB — HM DIABETES EYE EXAM

## 2017-01-04 ENCOUNTER — Telehealth: Payer: Self-pay | Admitting: Family Medicine

## 2017-01-04 NOTE — Telephone Encounter (Signed)
Called her to check on how she is doing She was not able to get the Jardiance as it was too expensive She is trying to exercise more- [place], and is seeing improvements in her sugars by doing this.  She will see me soon but right now things are hard as one of his sons is in very poor health

## 2017-01-08 ENCOUNTER — Ambulatory Visit: Payer: Commercial Managed Care - HMO | Admitting: *Deleted

## 2017-01-15 ENCOUNTER — Ambulatory Visit: Payer: Commercial Managed Care - HMO | Admitting: Family Medicine

## 2017-03-05 ENCOUNTER — Other Ambulatory Visit: Payer: Self-pay | Admitting: Family Medicine

## 2017-03-05 DIAGNOSIS — E118 Type 2 diabetes mellitus with unspecified complications: Secondary | ICD-10-CM

## 2017-03-08 ENCOUNTER — Ambulatory Visit (INDEPENDENT_AMBULATORY_CARE_PROVIDER_SITE_OTHER): Payer: Medicare HMO | Admitting: Family Medicine

## 2017-03-08 VITALS — BP 106/80 | HR 88 | Temp 98.7°F | Ht 64.0 in | Wt 177.2 lb

## 2017-03-08 DIAGNOSIS — Z794 Long term (current) use of insulin: Secondary | ICD-10-CM

## 2017-03-08 DIAGNOSIS — T23202A Burn of second degree of left hand, unspecified site, initial encounter: Secondary | ICD-10-CM | POA: Diagnosis not present

## 2017-03-08 DIAGNOSIS — Z23 Encounter for immunization: Secondary | ICD-10-CM

## 2017-03-08 DIAGNOSIS — E1165 Type 2 diabetes mellitus with hyperglycemia: Secondary | ICD-10-CM

## 2017-03-08 DIAGNOSIS — I10 Essential (primary) hypertension: Secondary | ICD-10-CM

## 2017-03-08 DIAGNOSIS — E1142 Type 2 diabetes mellitus with diabetic polyneuropathy: Secondary | ICD-10-CM | POA: Diagnosis not present

## 2017-03-08 DIAGNOSIS — R740 Nonspecific elevation of levels of transaminase and lactic acid dehydrogenase [LDH]: Secondary | ICD-10-CM

## 2017-03-08 DIAGNOSIS — IMO0001 Reserved for inherently not codable concepts without codable children: Secondary | ICD-10-CM

## 2017-03-08 DIAGNOSIS — T23272A Burn of second degree of left wrist, initial encounter: Secondary | ICD-10-CM

## 2017-03-08 DIAGNOSIS — R7401 Elevation of levels of liver transaminase levels: Secondary | ICD-10-CM

## 2017-03-08 MED ORDER — GABAPENTIN 100 MG PO CAPS: 100.0000 mg | ORAL_CAPSULE | Freq: Every day | ORAL | 3 refills | Status: DC

## 2017-03-08 NOTE — Progress Notes (Addendum)
[place] at [facility] [address] [place], [place] [address] [phone]  Date:  03/08/2017   Name:  Mandy Kim   DOB:  09-21-1946   MRN:  [id number]  PCP:  Lamar Blinks, MD    Chief Complaint: Follow-up (Pt here for 6 month follow up)   History of Present Illness:  Mandy Kim is a 71 y.o. very pleasant female patient who presents with the following: Last seen by myself in December-  Here today for a recheck visit- history of fatty liver and elevated LFTs She also has DM which we have struggled to control as of late  Peripheral neuropathy-  We added lyrica at her last visit for foot pain.  However it made her too sleepy so she stopped using this.  She would like to try some neurontin to take before bed- thinks that she took this in the past and it worked well We had tried to add a GLP-A agonist but she had too much in the way of GI SE.    We had tried to add jardiance after her most recent A1c but it was too expensive.  She had planned to exercise more and hoped that this would impove her glucose control  She notes that her blood sugar is running 200- 300; may hit 400 on occasion She is on 20 units of lantus once a day, and she is on metformin BID also  Lab Results  Component Value Date   HGBA1C 9.0 (H) 11/20/2016    She is walking 5 miles about 3x a week- [place].  She is overdue for a tetanus shot and burned her left index finger 2 days ago while burning some papers- would like to update her tetanus shot today. She has a small 2nd degree burn on her left index finger which she thinks is ok, they are treating it with some sort of burn cream  Here today with her husband. They brought me some fresh eggs from their farm which are much appreciated      Wt Readings from Last 3 Encounters:  03/08/17 177 lb 3.2 oz (80.4 kg)  11/20/16 181 lb 3.2 oz (82.2 kg)   09/07/16 180 lb 6.4 oz (81.8 kg)   BP Readings from Last 3 Encounters:  03/08/17 106/80  11/20/16 (!) 140/55  09/07/16 112/64    Due for a foot exam today   Patient Active Problem List   Diagnosis Date Noted  . Peripheral neuropathy ([place]) 07/27/2014  . Other and unspecified hyperlipidemia 02/12/2012  . AAA (abdominal aortic aneurysm) ([place]) 02/12/2012  . Fatty liver 02/12/2012  . Breast cancer ([place]) 02/12/2012  . Cervical cancer ([place]) 02/12/2012  . Diabetes mellitus 02/12/2012  . Hyperlipidemia 02/12/2012    Past Medical History:  Diagnosis Date  . Allergy   . Cancer ([place])   . Cataract   . Chronic kidney disease   . Diabetes mellitus   . Diverticulitis   . GERD (gastroesophageal reflux disease)   . Hyperlipidemia   . Hypertension   . Sleep apnea    has c pap machine    Past Surgical History:  Procedure Laterality Date  . ABDOMINAL AORTIC ANEURYSM REPAIR    . ABDOMINAL AORTIC ANEURYSM REPAIR    . ABDOMINAL HYSTERECTOMY  2000  . Bladder tacking    . BREAST SURGERY    . CATARACT EXTRACTION, BILATERAL    .  EYE SURGERY    . MASTECTOMY    . TONSILLECTOMY      Social History  Substance Use Topics  . Smoking status: Former Smoker    Years: 30.00    Types: Cigarettes    Quit date: 06/07/1999  . Smokeless tobacco: Not on file  . Alcohol use Not on file    Family History  Problem Relation Age of Onset  . Cancer Mother   . Pneumonia Mother   . Thyroid disease Mother   . Thyroid disease Sister   . Obesity Daughter   . Hypertension Daughter   . Thyroid disease Son   . Pneumonia Maternal Grandmother   . Heart attack Maternal Grandfather     Allergies  Allergen Reactions  . Aspirin     Per pt any pain medications  . Crestor [Rosuvastatin Calcium]   . Lipitor [Atorvastatin Calcium]   . Pravastatin   . Simvastatin   . Trulicity [Dulaglutide] Other (See Comments)    flatulance    Medication list has been reviewed and updated.  Current Outpatient  Prescriptions on File Prior to Visit  Medication Sig Dispense Refill  . Alcohol Swabs PADS Use to test blood sugar 3 times daily. Dx: E11.9 300 each 2  . b complex vitamins tablet Take 1 tablet by mouth daily.    . Biotin 1000 MCG tablet Take 1,000 mcg by mouth daily.    . Blood Glucose Calibration (GLUCOSE CONTROL) SOLN Use to test blood sugar 3 times daily. Dx: E11.9 1 each 2  . Blood Glucose Monitoring Suppl (TRUE METRIX AIR GLUCOSE METER) w/Device KIT USE  TO TEST BLOOD SUGAR THREE TIMES DAILY 1 kit 0  . cholecalciferol (VITAMIN D) 1000 UNITS tablet Take 1,000 Units by mouth daily.    . clopidogrel (PLAVIX) 75 MG tablet Take 1 tablet (75 mg total) by mouth daily. 90 tablet 3  . gemfibrozil (LOPID) 600 MG tablet TAKE 1 TABLET (600 MG TOTAL) BY MOUTH 2 (TWO) TIMES DAILY BEFORE A MEAL. 180 tablet 3  . Insulin Glargine (LANTUS) 100 UNIT/ML Solostar Pen Start with 10 units daily, increase by 2 units daily until fasting sugar is 150.  Max 20 units daily 15 mL 11  . Insulin Pen Needle (PEN NEEDLES) 32G X 6 MM MISC 1 each by Does not apply route daily. 100 each 3  . metFORMIN (GLUCOPHAGE) 1000 MG tablet TAKE 1 TABLET (500 MG TOTAL) BY MOUTH 2 (TWO) TIMES DAILY WITH A MEAL. 180 tablet 1  . Multiple Vitamin (MULTIVITAMIN) tablet Take 0.5 tablets by mouth 2 (two) times daily.     . niacin (NIASPAN) 500 MG CR tablet Take 1 tablet (500 mg total) by mouth at bedtime. 30 tablet 3  . pantoprazole (PROTONIX) 40 MG tablet TAKE 1 TABLET BY MOUTH EVERY DAY ON AN EMPTY STOMACH 90 tablet 3  . potassium chloride SA (KLOR-CON M20) 20 MEQ tablet Take 1 tablet (20 mEq total) by mouth daily. 90 tablet 3  . TRUE METRIX BLOOD GLUCOSE TEST test strip USE AS DIRECTED TO TEST BLOOD SUGAR THREE TIMES DAILY 1 each 0   No current facility-administered medications on file prior to visit.     Review of Systems:  As per HPI- otherwise negative.   Physical Examination: Vitals:   03/08/17 1404  BP: 106/80  Pulse: 88   Temp: 98.7 F (37.1 C)   Vitals:   03/08/17 1404  Weight: 177 lb 3.2 oz (80.4 kg)  Height: 5' 4"  (1.626 m)  Body mass index is 30.42 kg/m. Ideal Body Weight: Weight in (lb) to have BMI = 25: 145.3  GEN: WDWN, NAD, Non-toxic, A & O x 3, obese, looks well HEENT: Atraumatic, Normocephalic. Neck supple. No masses, No LAD. Ears and Nose: No external deformity. CV: RRR, No M/G/R. No JVD. No thrill. No extra heart sounds. PULM: CTA B, no wheezes, crackles, rhonchi. No retractions. No resp. distress. No accessory muscle use. ABD: S, NT, ND EXTR: No c/c/e Burn on her left index finger, pad aspect.  2nd degree, approx 1.5 cm long and 4 mm wide.  Does not appear infected  NEURO Normal gait.  PSYCH: Normally interactive. Conversant. Not depressed or anxious appearing.  Calm demeanor.  Normal foot exam today  Assessment and Plan: Uncontrolled type 2 diabetes mellitus without complication, with long-term current use of insulin (HCC) - Plan: Comprehensive metabolic panel, Hemoglobin A1c  Transaminitis - Plan: Comprehensive metabolic panel  Diabetic polyneuropathy associated with type 2 diabetes mellitus ([place]) - Plan: gabapentin (NEURONTIN) 100 MG capsule  Second degree burn of left wrist and hand, initial encounter - Plan: Td vaccine greater than or equal to 7yo preservative free IM  Essential hypertension, benign  Here today to recheck on her DM and other health maint concerns Will have her try Neurontin at bedtime for her neuropathy- start with 100 mg and can increase gradually as needed   BP is under very good control- no meds at this time Will plan further follow- up pending labs.  Signed Lamar Blinks, MD  4/10-  Received her labs and gave her a call Results for orders placed or performed in visit on 03/08/17  Comprehensive metabolic panel  Result Value Ref Range   Sodium 136 135 - 145 mEq/L   Potassium 4.0 3.5 - 5.1 mEq/L   Chloride 101 96 - 112 mEq/L   CO2 26 19 - 32  mEq/L   Glucose, Bld 177 (H) 70 - 99 mg/dL   BUN 17 6 - 23 mg/dL   Creatinine, Ser 0.79 0.40 - 1.20 mg/dL   Total Bilirubin 0.6 0.2 - 1.2 mg/dL   Alkaline Phosphatase 44 39 - 117 U/L   AST 88 (H) 0 - 37 U/L   ALT 69 (H) 0 - 35 U/L   Total Protein 8.0 6.0 - 8.3 g/dL   Albumin 4.6 3.5 - 5.2 g/dL   Calcium 9.8 8.4 - 10.5 mg/dL   GFR 75.95 >60.00 mL/min  Hemoglobin A1c  Result Value Ref Range   Hgb A1c MFr Bld 10.9 (H) 4.6 - 6.5 %   She is taking metformin po and 20 units of lantus daily.  She will increase her lantus by 2 units every 2 days until her morning FBG is approx 150.  I am going to repeat her liver US as it is a couple of years old.  Presume fatty liver; she had a negative hep panel last year.   Asked her to see me in 3 months

## 2017-03-08 NOTE — Progress Notes (Signed)
Pre visit review using our clinic review tool, if applicable. No additional management support is needed unless otherwise documented below in the visit note. 

## 2017-03-08 NOTE — Patient Instructions (Addendum)
We will check on your diabetes today and I will be in touch regarding your A1c.  If your A1c is still too high we will plan to adjust your insulin dose You got your tetanus shot today so you are all set for your hiking trip! We will have you try neurontin 100 mg at bedtime for your foot pain.  If this does not work you can increase to 200 and then 300 mg.  Please let me know how this does for you!

## 2017-03-09 LAB — COMPREHENSIVE METABOLIC PANEL
ALK PHOS: 44 U/L (ref 39–117)
ALT: 69 U/L — AB (ref 0–35)
AST: 88 U/L — AB (ref 0–37)
Albumin: 4.6 g/dL (ref 3.5–5.2)
BILIRUBIN TOTAL: 0.6 mg/dL (ref 0.2–1.2)
BUN: 17 mg/dL (ref 6–23)
CALCIUM: 9.8 mg/dL (ref 8.4–10.5)
CO2: 26 meq/L (ref 19–32)
CREATININE: 0.79 mg/dL (ref 0.40–1.20)
Chloride: 101 mEq/L (ref 96–112)
GFR: 75.95 mL/min (ref >60.00)
GLUCOSE: 177 mg/dL — AB (ref 70–99)
Potassium: 4 mEq/L (ref 3.5–5.1)
Sodium: 136 mEq/L (ref 135–145)
Total Protein: 8 g/dL (ref 6.0–8.3)

## 2017-03-09 LAB — HEMOGLOBIN A1C: Hgb A1c MFr Bld: 10.9 % — ABNORMAL HIGH (ref 4.6–6.5)

## 2017-03-13 NOTE — Addendum Note (Signed)
Addended by: Lamar Blinks C on: 03/13/2017 02:34 PM   Modules accepted: Orders

## 2017-03-14 ENCOUNTER — Ambulatory Visit (HOSPITAL_BASED_OUTPATIENT_CLINIC_OR_DEPARTMENT_OTHER)
Admission: RE | Admit: 2017-03-14 | Discharge: 2017-03-14 | Disposition: A | Discharge status: Home / Self Care | Payer: Medicare HMO | Source: Ambulatory Visit | Attending: Family Medicine | Admitting: Family Medicine

## 2017-03-14 DIAGNOSIS — K8020 Calculus of gallbladder without cholecystitis without obstruction: Secondary | ICD-10-CM | POA: Insufficient documentation

## 2017-03-14 DIAGNOSIS — R740 Nonspecific elevation of levels of transaminase and lactic acid dehydrogenase [LDH]: Secondary | ICD-10-CM

## 2017-03-14 DIAGNOSIS — K769 Liver disease, unspecified: Secondary | ICD-10-CM | POA: Diagnosis not present

## 2017-03-14 DIAGNOSIS — R7401 Elevation of levels of liver transaminase levels: Secondary | ICD-10-CM

## 2017-03-20 ENCOUNTER — Encounter: Payer: Self-pay | Admitting: Family Medicine

## 2017-04-05 ENCOUNTER — Other Ambulatory Visit: Payer: Self-pay | Admitting: Family Medicine

## 2017-04-05 DIAGNOSIS — Z9889 Other specified postprocedural states: Secondary | ICD-10-CM

## 2017-04-05 NOTE — Telephone Encounter (Signed)
Requesting: clopidogrel (PLAVIX) 75 MG tablet  Contract UDS Last OV: 03/08/2017 Last Refill: 02/04/16  Please Advise

## 2017-04-11 ENCOUNTER — Other Ambulatory Visit: Payer: Self-pay | Admitting: Emergency Medicine

## 2017-04-11 DIAGNOSIS — E1142 Type 2 diabetes mellitus with diabetic polyneuropathy: Secondary | ICD-10-CM

## 2017-04-11 MED ORDER — GABAPENTIN 100 MG PO CAPS: 100.0000 mg | ORAL_CAPSULE | Freq: Every day | ORAL | 3 refills | Status: DC

## 2017-04-16 DIAGNOSIS — N3010 Interstitial cystitis (chronic) without hematuria: Secondary | ICD-10-CM | POA: Diagnosis not present

## 2017-04-16 DIAGNOSIS — N3020 Other chronic cystitis without hematuria: Secondary | ICD-10-CM | POA: Diagnosis not present

## 2017-04-24 ENCOUNTER — Other Ambulatory Visit: Payer: Self-pay | Admitting: Family Medicine

## 2017-04-24 DIAGNOSIS — Z5181 Encounter for therapeutic drug level monitoring: Secondary | ICD-10-CM

## 2017-04-25 ENCOUNTER — Other Ambulatory Visit: Payer: Self-pay | Admitting: Family Medicine

## 2017-04-25 DIAGNOSIS — E785 Hyperlipidemia, unspecified: Secondary | ICD-10-CM

## 2017-05-01 NOTE — Progress Notes (Deleted)
Subjective:   Mandy Kim is a 71 y.o. female who presents for an Initial Medicare Annual Wellness Visit.  The Patient was informed that the wellness visit is to identify future health risk and educate and initiate measures that can reduce risk for increased disease through the lifespan.   Describes health as fair, good or great?   Review of Systems    No ROS.  Medicare Wellness Visit.   Sleep patterns:  Home Safety/Smoke Alarms:   Living environment; residence and Firearm Safety:  Seat Belt Safety/Bike Helmet: Wears seat belt.   Counseling:   Eye Exam-  Dental-  Female:   Pap- Hysterectomy      Mammo-       Dexa scan-        CCS-     Objective:    There were no vitals filed for this visit. There is no height or weight on file to calculate BMI.   Current Medications (verified) Outpatient Encounter Prescriptions as of 05/02/2017  Medication Sig  . Alcohol Swabs PADS Use to test blood sugar 3 times daily. Dx: E11.9  . b complex vitamins tablet Take 1 tablet by mouth daily.  . Biotin 1000 MCG tablet Take 1,000 mcg by mouth daily.  . Blood Glucose Calibration (GLUCOSE CONTROL) SOLN Use to test blood sugar 3 times daily. Dx: E11.9  . Blood Glucose Monitoring Suppl (TRUE METRIX AIR GLUCOSE METER) w/Device KIT USE  TO TEST BLOOD SUGAR THREE TIMES DAILY  . cholecalciferol (VITAMIN D) 1000 UNITS tablet Take 1,000 Units by mouth daily.  . clopidogrel (PLAVIX) 75 MG tablet TAKE 1 TABLET (75 MG TOTAL) BY MOUTH DAILY.  Marland Kitchen gabapentin (NEURONTIN) 100 MG capsule Take 1 capsule (100 mg total) by mouth at bedtime. Can increase to 200 or 300 mg as needed for foot pain  . gemfibrozil (LOPID) 600 MG tablet TAKE 1 TABLET (600 MG TOTAL) BY MOUTH 2 (TWO) TIMES DAILY BEFORE A MEAL.  Marland Kitchen Insulin Glargine (LANTUS) 100 UNIT/ML Solostar Pen Start with 10 units daily, increase by 2 units daily until fasting sugar is 150.  Max 20 units daily  . Insulin Pen Needle (PEN NEEDLES) 32G X 6 MM MISC 1  each by Does not apply route daily.  Marland Kitchen KLOR-CON M20 20 MEQ tablet TAKE 1 TABLET (20 MEQ TOTAL) BY MOUTH DAILY.  . metFORMIN (GLUCOPHAGE) 1000 MG tablet TAKE 1 TABLET (500 MG TOTAL) BY MOUTH 2 (TWO) TIMES DAILY WITH A MEAL.  . Multiple Vitamin (MULTIVITAMIN) tablet Take 0.5 tablets by mouth 2 (two) times daily.   . niacin (NIASPAN) 500 MG CR tablet Take 1 tablet (500 mg total) by mouth at bedtime.  . pantoprazole (PROTONIX) 40 MG tablet TAKE 1 TABLET BY MOUTH EVERY DAY ON AN EMPTY STOMACH  . TRUE METRIX BLOOD GLUCOSE TEST test strip USE AS DIRECTED TO TEST BLOOD SUGAR THREE TIMES DAILY   No facility-administered encounter medications on file as of 05/02/2017.     Allergies (verified) Aspirin; Crestor [rosuvastatin calcium]; Lipitor [atorvastatin calcium]; Pravastatin; Simvastatin; and Trulicity [dulaglutide]   History: Past Medical History:  Diagnosis Date  . Allergy   . Cancer ([place])   . Cataract   . Chronic kidney disease   . Diabetes mellitus   . Diverticulitis   . GERD (gastroesophageal reflux disease)   . Hyperlipidemia   . Hypertension   . Sleep apnea    has c pap machine   Past Surgical History:  Procedure Laterality Date  . ABDOMINAL AORTIC  ANEURYSM REPAIR    . ABDOMINAL AORTIC ANEURYSM REPAIR    . ABDOMINAL HYSTERECTOMY  2000  . Bladder tacking    . BREAST SURGERY    . CATARACT EXTRACTION, BILATERAL    . EYE SURGERY    . MASTECTOMY    . TONSILLECTOMY     Family History  Problem Relation Age of Onset  . Cancer Mother   . Pneumonia Mother   . Thyroid disease Mother   . Thyroid disease Sister   . Obesity Daughter   . Hypertension Daughter   . Thyroid disease Son   . Pneumonia Maternal Grandmother   . Heart attack Maternal Grandfather    Social History   Occupational History  . Not on file.   Social History Main Topics  . Smoking status: Former Smoker    Years: 30.00    Types: Cigarettes    Quit date: 06/07/1999  . Smokeless tobacco: Not on file  .  Alcohol use Not on file  . Drug use: Unknown  . Sexual activity: Yes    Tobacco Counseling Counseling given: Not Answered   Activities of Daily Living No flowsheet data found.  Immunizations and Health Maintenance Immunization History  Administered Date(s) Administered  . Pneumococcal Conjugate-13 07/27/2014  . Pneumococcal Polysaccharide-23 02/25/2012  . Td 03/08/2017   Health Maintenance Due  Topic Date Due  . FOOT EXAM  03/14/2016    Patient Care Team: Kim, Mandy Filler, MD as PCP - General (Family Medicine)  Indicate any recent Medical Services you may have received from other than [facility] providers in the past year (date may be approximate).     Assessment:   This is a routine wellness examination for Mandy Kim. Physical assessment deferred to PCP.   Hearing/Vision screen No exam data present  Dietary issues and exercise activities discussed:   Diet (meal preparation, eat out, water intake, caffeinated beverages, dairy products, fruits and vegetables): {Desc; diets:16563} Breakfast: Lunch:  Dinner:      Goals    None     Depression Screen PHQ 2/9 Scores 09/07/2016 08/18/2015 03/15/2015 07/27/2014  PHQ - 2 Score 0 0 0 0    Fall Risk Fall Risk  09/07/2016 03/15/2015 07/27/2014  Falls in the past year? No No No    Cognitive Function:        Screening Tests Health Maintenance  Topic Date Due  . FOOT EXAM  03/14/2016  . INFLUENZA VACCINE  07/04/2017  . HEMOGLOBIN A1C  09/07/2017  . URINE MICROALBUMIN  09/07/2017  . OPHTHALMOLOGY EXAM  12/26/2017  . COLONOSCOPY  10/29/2022  . TETANUS/TDAP  03/09/2027  . DEXA SCAN  Completed  . Hepatitis C Screening  Completed  . PNA vac Low Risk Adult  Completed      Plan:   ***  I have personally reviewed and noted the following in the patient's chart:   . Medical and social history . Use of alcohol, tobacco or illicit drugs  . Current medications and supplements . Functional ability and status . Nutritional  status . Physical activity . Advanced directives . List of other physicians . Hospitalizations, surgeries, and ER visits in previous 12 months . Vitals . Screenings to include cognitive, depression, and falls . Referrals and appointments  In addition, I have reviewed and discussed with patient certain preventive protocols, quality metrics, and best practice recommendations. A written personalized care plan for preventive services as well as general preventive health recommendations were provided to patient.     Naaman Plummer  Glenard Haring, RN   05/01/2017

## 2017-05-01 NOTE — Progress Notes (Signed)
[place] at [facility] [address] [place], [place] [address] [phone]  Date:  05/02/2017   Name:  Mandy Kim   DOB:  1945/12/17   MRN:  [id number]  PCP:  Darreld Mclean, MD    Chief Complaint: Follow-up (Pt here for f/u visit. also c/o posion ivy on left ear. )   History of Present Illness:  Mandy Kim is a 72 y.o. very pleasant female patient who presents with the following:  Here today for a periodic recheck of her DM At her last visit on 4/5 we started her on neurontin for neuropathy -  We also had to increase her lantus as her A1c has been too high over the last year.  Previously she had been generally well controlled with occasional high A1c readings.  She notes that exercising and trying to watch her diet does not control her sugars like it used to Needs her foot exam today  Her LFTs were up at last visit- repeated her RUQ [place] as below.  Negative hep panel in December of 17  IMPRESSION: Tiny nonshadowing gallstones. Slight sludge in gallbladder. No gallbladder wall thickening or pericholecystic fluid.  The liver echogenicity is increased and mildly inhomogeneous, likely indicative of hepatic steatosis. While no focal liver lesions are evident, it must be cautioned that the sensitivity of ultrasound for detection of focal liver lesions is diminished in this circumstance.  She was pulling some vines off her fence a couple of days ago and got some PI on her left ear.  It is much better now than it was at first They are using some sort of OTC PI treatment that has helped a lot. It is a scrub that you apply in the shower, she will continue to use this as needed.  Would prefer to avoid oral steroids due to her uncontrolled glucose    They tried benadryl gel but it did not work  Her glucose is running 250 fasting even with 40 units of lantus Also 1000 metformin twice a day We had wanted to try jardiance  for her- however it was too expensive.  Also on further discussion she has been working with a urologist for chronic and recurrent UTI so this type of medication is not ideal anyway She has tried working on her diet and exercise-   She has never seen endocrinology for her DM but would be willing to do so  She would like an rx for zyrtec which is fine  She is taking 100 mg of gabapentin at bedtime- this is helping her to sleep better and is helping with her foot sx She does not wish to increase her dose at this time It wears off by morning so she is not feeling tired or groggy at all  Too soon to repeat her TSH today Renal function is normal   Lab Results  Component Value Date   HGBA1C 10.9 (H) 03/08/2017     Patient Active Problem List   Diagnosis Date Noted  . Peripheral neuropathy 07/27/2014  . Other and unspecified hyperlipidemia 02/12/2012  . AAA (abdominal aortic aneurysm) ([place]) 02/12/2012  . Fatty liver 02/12/2012  . Breast cancer ([place]) 02/12/2012  . Cervical cancer ([place]) 02/12/2012  . Diabetes mellitus 02/12/2012  . Hyperlipidemia 02/12/2012    Past Medical History:  Diagnosis Date  . Allergy   . Cancer ([place])   . Cataract   . Chronic kidney disease   .  Diabetes mellitus   . Diverticulitis   . GERD (gastroesophageal reflux disease)   . Hyperlipidemia   . Hypertension   . Sleep apnea    has c pap machine    Past Surgical History:  Procedure Laterality Date  . ABDOMINAL AORTIC ANEURYSM REPAIR    . ABDOMINAL AORTIC ANEURYSM REPAIR    . ABDOMINAL HYSTERECTOMY  2000  . Bladder tacking    . BREAST SURGERY    . CATARACT EXTRACTION, BILATERAL    . EYE SURGERY    . MASTECTOMY    . TONSILLECTOMY      Social History  Substance Use Topics  . Smoking status: Former Smoker    Years: 30.00    Types: Cigarettes    Quit date: 06/07/1999  . Smokeless tobacco: Not on file  . Alcohol use Not on file    Family History  Problem Relation Age of Onset  . Cancer  Mother   . Pneumonia Mother   . Thyroid disease Mother   . Thyroid disease Sister   . Obesity Daughter   . Hypertension Daughter   . Thyroid disease Son   . Pneumonia Maternal Grandmother   . Heart attack Maternal Grandfather     Allergies  Allergen Reactions  . Aspirin     Per pt any pain medications  . Crestor [Rosuvastatin Calcium]   . Lipitor [Atorvastatin Calcium]   . Pravastatin   . Simvastatin   . Trulicity [Dulaglutide] Other (See Comments)    flatulance    Medication list has been reviewed and updated.  Current Outpatient Prescriptions on File Prior to Visit  Medication Sig Dispense Refill  . Alcohol Swabs PADS Use to test blood sugar 3 times daily. Dx: E11.9 300 each 2  . b complex vitamins tablet Take 1 tablet by mouth daily.    . Biotin 1000 MCG tablet Take 1,000 mcg by mouth daily.    . Blood Glucose Calibration (GLUCOSE CONTROL) SOLN Use to test blood sugar 3 times daily. Dx: E11.9 1 each 2  . Blood Glucose Monitoring Suppl (TRUE METRIX AIR GLUCOSE METER) w/Device KIT USE  TO TEST BLOOD SUGAR THREE TIMES DAILY 1 kit 0  . cholecalciferol (VITAMIN D) 1000 UNITS tablet Take 1,000 Units by mouth daily.    . clopidogrel (PLAVIX) 75 MG tablet TAKE 1 TABLET (75 MG TOTAL) BY MOUTH DAILY. 90 tablet 3  . gabapentin (NEURONTIN) 100 MG capsule Take 1 capsule (100 mg total) by mouth at bedtime. Can increase to 200 or 300 mg as needed for foot pain 90 capsule 3  . gemfibrozil (LOPID) 600 MG tablet TAKE 1 TABLET (600 MG TOTAL) BY MOUTH 2 (TWO) TIMES DAILY BEFORE A MEAL. 180 tablet 3  . Insulin Glargine (LANTUS) 100 UNIT/ML Solostar Pen Start with 10 units daily, increase by 2 units daily until fasting sugar is 150.  Max 20 units daily 15 mL 11  . Insulin Pen Needle (PEN NEEDLES) 32G X 6 MM MISC 1 each by Does not apply route daily. 100 each 3  . KLOR-CON M20 20 MEQ tablet TAKE 1 TABLET (20 MEQ TOTAL) BY MOUTH DAILY. 90 tablet 3  . metFORMIN (GLUCOPHAGE) 1000 MG tablet TAKE 1  TABLET (500 MG TOTAL) BY MOUTH 2 (TWO) TIMES DAILY WITH A MEAL. 180 tablet 1  . Multiple Vitamin (MULTIVITAMIN) tablet Take 0.5 tablets by mouth 2 (two) times daily.     . niacin (NIASPAN) 500 MG CR tablet Take 1 tablet (500 mg total) by mouth  at bedtime. 30 tablet 3  . pantoprazole (PROTONIX) 40 MG tablet TAKE 1 TABLET BY MOUTH EVERY DAY ON AN EMPTY STOMACH 90 tablet 3  . TRUE METRIX BLOOD GLUCOSE TEST test strip USE AS DIRECTED TO TEST BLOOD SUGAR THREE TIMES DAILY 1 each 0   No current facility-administered medications on file prior to visit.     Review of Systems:  As per HPI- otherwise negative.   Physical Examination: Vitals:   05/02/17 1219  BP: 110/72  Pulse: 72  Temp: 98.5 F (36.9 C)   Vitals:   05/02/17 1219  Weight: 179 lb (81.2 kg)  Height: 5' 4"  (1.626 m)   Body mass index is 30.73 kg/m. Ideal Body Weight: Weight in (lb) to have BMI = 25: 145.3  GEN: WDWN, NAD, Non-toxic, A & O x 3, overweight, looks well HEENT: Atraumatic, Normocephalic. Neck supple. No masses, No LAD.  Mild inflammation and swelling of the left external ear without any evidence of infection.  Per pt the ear already looks better than it did 1-2 days ago Bilateral TM wnl, oropharynx normal.  PEERL,EOMI.   Ears and Nose: No external deformity. CV: RRR, No M/G/R. No JVD. No thrill. No extra heart sounds. PULM: CTA B, no wheezes, crackles, rhonchi. No retractions. No resp. distress. No accessory muscle use. EXTR: No c/c/e NEURO Normal gait.  PSYCH: Normally interactive. Conversant. Not depressed or anxious appearing.  Calm demeanor.  Foot exam today- normal    Assessment and Plan: Uncontrolled type 2 diabetes mellitus without complication, with long-term current use of insulin ([place]) - Plan: Ambulatory referral to Endocrinology  Seasonal allergic rhinitis due to other allergic trigger - Plan: cetirizine (ZYRTEC) 10 MG chewable tablet  Poison ivy  Elevated liver function tests  DM has been  under poor control despite max metformin and more aggressive insulin use.  Will refer to endocrinology for help- pt is agreeable with plan  Poison ivy on left ear is resolving without intervention- continue OTC treatment Refilled her zyrtec today to use as needed for itching and allergies in general Will repeat her liver function tests at next visit in October  Signed Lamar Blinks, MD

## 2017-05-02 ENCOUNTER — Ambulatory Visit (INDEPENDENT_AMBULATORY_CARE_PROVIDER_SITE_OTHER): Payer: Medicare HMO | Admitting: Family Medicine

## 2017-05-02 VITALS — BP 110/72 | HR 72 | Temp 98.5°F | Ht 64.0 in | Wt 179.0 lb

## 2017-05-02 DIAGNOSIS — E1165 Type 2 diabetes mellitus with hyperglycemia: Secondary | ICD-10-CM | POA: Diagnosis not present

## 2017-05-02 DIAGNOSIS — R7989 Other specified abnormal findings of blood chemistry: Secondary | ICD-10-CM

## 2017-05-02 DIAGNOSIS — IMO0001 Reserved for inherently not codable concepts without codable children: Secondary | ICD-10-CM

## 2017-05-02 DIAGNOSIS — L237 Allergic contact dermatitis due to plants, except food: Secondary | ICD-10-CM

## 2017-05-02 DIAGNOSIS — Z794 Long term (current) use of insulin: Secondary | ICD-10-CM

## 2017-05-02 DIAGNOSIS — R945 Abnormal results of liver function studies: Secondary | ICD-10-CM

## 2017-05-02 DIAGNOSIS — J3089 Other allergic rhinitis: Secondary | ICD-10-CM

## 2017-05-02 MED ORDER — CETIRIZINE HCL 10 MG PO CHEW: 10.0000 mg | CHEWABLE_TABLET | Freq: Every day | ORAL | 3 refills | Status: DC

## 2017-05-02 NOTE — Patient Instructions (Signed)
It was very nice to see you today!   We are having a very hard time getting your diabetes under control- I am going to refer you to endocrinology to give Mandy Kim a hand I refilled your zyrtec today for you to use as needed for itching and allergies Your poison ivy appears to be doing ok- continue using the OTC scrub that you are using and let me know if not getting better

## 2017-05-07 ENCOUNTER — Ambulatory Visit (INDEPENDENT_AMBULATORY_CARE_PROVIDER_SITE_OTHER): Payer: Medicare HMO | Admitting: Endocrinology

## 2017-05-07 ENCOUNTER — Encounter: Payer: Self-pay | Admitting: Endocrinology

## 2017-05-07 DIAGNOSIS — IMO0001 Reserved for inherently not codable concepts without codable children: Secondary | ICD-10-CM

## 2017-05-07 DIAGNOSIS — E1165 Type 2 diabetes mellitus with hyperglycemia: Secondary | ICD-10-CM

## 2017-05-07 DIAGNOSIS — Z794 Long term (current) use of insulin: Secondary | ICD-10-CM | POA: Diagnosis not present

## 2017-05-07 MED ORDER — INSULIN GLARGINE 100 UNIT/ML SOLOSTAR PEN: 80.0000 [IU] | PEN_INJECTOR | SUBCUTANEOUS | 3 refills | Status: DC

## 2017-05-07 NOTE — Patient Instructions (Signed)
good diet and exercise significantly improve the control of your diabetes.  please let me know if you wish to be referred to a dietician.  high blood sugar is very risky to your health.  you should see an eye doctor and dentist every year.  It is very important to get all recommended vaccinations.  Controlling your blood pressure and cholesterol drastically reduces the damage diabetes does to your body.  Those who smoke should quit.  Please discuss these with your doctor.  check your blood sugar twice a day.  vary the time of day when you check, between before the 3 meals, and at bedtime.  also check if you have symptoms of your blood sugar being too high or too low.  please keep a record of the readings and bring it to your next appointment here (or you can bring the meter itself).  You can write it on any piece of paper.  please call us sooner if your blood sugar goes below 70, or if you have a lot of readings over 200. Please call us next week, to tell us how the blood sugar is doing.   Please come back for a follow-up appointment in 3 months.

## 2017-05-07 NOTE — Progress Notes (Signed)
Subjective:    Patient ID: Mandy Kim, female    DOB: [birth year], 71 y.o.   MRN: [id number]  HPI pt is referred by Dr Lorelei Pont, for diabetes.  Pt states DM was dx'ed in 2015; she has mild if any neuropathy of the lower extremities, but she has associated PAD; she has been on insulin since 2016; pt says her diet and exercise are good; she has never had GDM, pancreatitis, pancreatic surgery, severe hypoglycemia or DKA.  She says she is unsure why glycemic control is worse x 6 months. She takes lantus 30-BID, and metformin.  She says cbg's vary from 220-450.   Past Medical History:  Diagnosis Date  . Allergy   . Cancer ([place])   . Cataract   . Chronic kidney disease   . Diabetes mellitus   . Diverticulitis   . GERD (gastroesophageal reflux disease)   . Hyperlipidemia   . Hypertension   . Sleep apnea    has c pap machine    Past Surgical History:  Procedure Laterality Date  . ABDOMINAL AORTIC ANEURYSM REPAIR    . ABDOMINAL AORTIC ANEURYSM REPAIR    . ABDOMINAL HYSTERECTOMY  2000  . Bladder tacking    . BREAST SURGERY    . CATARACT EXTRACTION, BILATERAL    . EYE SURGERY    . MASTECTOMY    . TONSILLECTOMY      Social History   Social History  . Marital status: Married    Spouse name: N/A  . Number of children: N/A  . Years of education: N/A   Occupational History  . Not on file.   Social History Main Topics  . Smoking status: Former Smoker    Years: 30.00    Types: Cigarettes    Quit date: 06/07/1999  . Smokeless tobacco: Not on file  . Alcohol use Not on file  . Drug use: Unknown  . Sexual activity: Yes   Other Topics Concern  . Not on file   Social History Narrative   Lives with husband in [place].   Occupation: truck Geophysicist/field seismologist.    Current Outpatient Prescriptions on File Prior to Visit  Medication Sig Dispense Refill  . Alcohol Swabs PADS Use to test blood sugar 3 times daily. Dx: E11.9 300 each 2  . Ascorbic Acid (CHEWABLE VITAMIN C PO) Take by  mouth.    Marland Kitchen b complex vitamins tablet Take 1 tablet by mouth daily.    . Biotin 1000 MCG tablet Take 1,000 mcg by mouth daily.    . Blood Glucose Calibration (GLUCOSE CONTROL) SOLN Use to test blood sugar 3 times daily. Dx: E11.9 1 each 2  . Blood Glucose Monitoring Suppl (TRUE METRIX AIR GLUCOSE METER) w/Device KIT USE  TO TEST BLOOD SUGAR THREE TIMES DAILY 1 kit 0  . cetirizine (ZYRTEC) 10 MG chewable tablet Chew 1 tablet (10 mg total) by mouth daily. 90 tablet 3  . cholecalciferol (VITAMIN D) 1000 UNITS tablet Take 1,000 Units by mouth daily.    . clopidogrel (PLAVIX) 75 MG tablet TAKE 1 TABLET (75 MG TOTAL) BY MOUTH DAILY. 90 tablet 3  . gabapentin (NEURONTIN) 100 MG capsule Take 1 capsule (100 mg total) by mouth at bedtime. Can increase to 200 or 300 mg as needed for foot pain 90 capsule 3  . gemfibrozil (LOPID) 600 MG tablet TAKE 1 TABLET (600 MG TOTAL) BY MOUTH 2 (TWO) TIMES DAILY BEFORE A MEAL. 180 tablet 3  . KLOR-CON M20 20 MEQ tablet TAKE  1 TABLET (20 MEQ TOTAL) BY MOUTH DAILY. 90 tablet 3  . metFORMIN (GLUCOPHAGE) 1000 MG tablet TAKE 1 TABLET (500 MG TOTAL) BY MOUTH 2 (TWO) TIMES DAILY WITH A MEAL. 180 tablet 1  . Multiple Vitamin (MULTIVITAMIN) tablet Take 0.5 tablets by mouth 2 (two) times daily.     . niacin (NIASPAN) 500 MG CR tablet Take 1 tablet (500 mg total) by mouth at bedtime. 30 tablet 3  . pantoprazole (PROTONIX) 40 MG tablet TAKE 1 TABLET BY MOUTH EVERY DAY ON AN EMPTY STOMACH 90 tablet 3  . TRUE METRIX BLOOD GLUCOSE TEST test strip USE AS DIRECTED TO TEST BLOOD SUGAR THREE TIMES DAILY 1 each 0   No current facility-administered medications on file prior to visit.     Allergies  Allergen Reactions  . Aspirin     Per pt any pain medications  . Crestor [Rosuvastatin Calcium]   . Lipitor [Atorvastatin Calcium]   . Pravastatin   . Simvastatin   . Trulicity [Dulaglutide] Other (See Comments)    flatulance    Family History  Problem Relation Age of Onset  . Cancer  Mother   . Pneumonia Mother   . Thyroid disease Mother   . Thyroid disease Sister   . Obesity Daughter   . Hypertension Daughter   . Thyroid disease Son   . Pneumonia Maternal Grandmother   . Heart attack Maternal Grandfather   . Diabetes Neg Hx     BP (!) 152/68 (BP Location: Left Arm, Patient Position: Sitting, Cuff Size: Normal)   Pulse 74   Temp 98.5 F (36.9 C) (Oral)   Ht 5' 4" (1.626 m)   Wt 180 lb (81.6 kg)   SpO2 90%   BMI 30.90 kg/m   Review of Systems denies blurry vision, headache, chest pain, sob, n/v, muscle cramps, dry skin, depression, cold intolerance, and rhinorrhea.  She has chronic weight gain, easy bruising, and frequent urination.      Objective:   Physical Exam VS: see vs page GEN: no distress HEAD: head: no deformity eyes: no periorbital swelling, no proptosis external nose and ears are normal mouth: no lesion seen NECK: supple, thyroid is not enlarged CHEST WALL: no deformity.  bilat mastectomies.   LUNGS: clear to auscultation CV: reg rate and rhythm, no murmur ABD: abdomen is soft, nontender.  no hepatosplenomegaly.  not distended.  no hernia.   MUSCULOSKELETAL: muscle bulk and strength are grossly normal.  no obvious joint swelling.  gait is normal and steady EXTEMITIES: no deformity.  no ulcer on the feet.  feet are of normal color and temp.  no edema PULSES: dorsalis pedis intact bilat.  no carotid bruit NEURO:  cn 2-12 grossly intact.   readily moves all 4's.  sensation is intact to touch on the feet SKIN:  Normal texture and temperature.  No rash or suspicious lesion is visible.   NODES:  None palpable at the neck.   PSYCH: alert, well-oriented.  Does not appear anxious nor depressed.     Lab Results  Component Value Date   CREATININE 0.79 03/08/2017   BUN 17 03/08/2017   NA 136 03/08/2017   K 4.0 03/08/2017   CL 101 03/08/2017   CO2 26 03/08/2017   Lab Results  Component Value Date   HGBA1C 10.9 (H) 03/08/2017   I personally  reviewed electrocardiogram tracing (01/13/13): Indication: wellness. Impression: NSR with 1D AVB  No MI.  No hypertrophy.  NS-TWA Compared to 2013: PVC's are resolved.  I have reviewed outside records, and summarized: Pt was noted to have severely elevated a1c, and referred here.  She was noted to be working on her diet and exercise, but results were not as good as in the past.      Assessment & Plan:  Insulin-requiring type 2 DM, with PAD: severe exacerbation.  Change lantus to 80 units QAM.   Weight gain: we discussed diet.  I advised her to add GLP drug, but she declines, due to cost.   elev BP: recheck next time.    Patient Instructions  good diet and exercise significantly improve the control of your diabetes.  please let me know if you wish to be referred to a dietician.  high blood sugar is very risky to your health.  you should see an eye doctor and dentist every year.  It is very important to get all recommended vaccinations.  Controlling your blood pressure and cholesterol drastically reduces the damage diabetes does to your body.  Those who smoke should quit.  Please discuss these with your doctor.  check your blood sugar twice a day.  vary the time of day when you check, between before the 3 meals, and at bedtime.  also check if you have symptoms of your blood sugar being too high or too low.  please keep a record of the readings and bring it to your next appointment here (or you can bring the meter itself).  You can write it on any piece of paper.  please call us sooner if your blood sugar goes below 70, or if you have a lot of readings over 200. Please call us next week, to tell us how the blood sugar is doing.   Please come back for a follow-up appointment in 3 months.

## 2017-05-08 ENCOUNTER — Telehealth: Payer: Self-pay | Admitting: Endocrinology

## 2017-05-08 ENCOUNTER — Other Ambulatory Visit: Payer: Self-pay | Admitting: Family Medicine

## 2017-05-08 DIAGNOSIS — Z794 Long term (current) use of insulin: Principal | ICD-10-CM

## 2017-05-08 DIAGNOSIS — IMO0001 Reserved for inherently not codable concepts without codable children: Secondary | ICD-10-CM

## 2017-05-08 DIAGNOSIS — E1165 Type 2 diabetes mellitus with hyperglycemia: Principal | ICD-10-CM

## 2017-05-08 MED ORDER — PEN NEEDLES 32G X 6 MM MISC: 1.0000 | Freq: Every day | 3 refills | Status: DC

## 2017-05-08 NOTE — Telephone Encounter (Signed)
**  Remind patient they can make refill requests via MyChart**  Medication refill request (Name & Dosage): Insulin Pen Needle (PEN NEEDLES) 32G X 6 MM MISC and the bottle needs to be filled   Preferred pharmacy (Name & Address):  [facility] ([address]), [place] [phone] (Phone) [phone] (Fax)      Other comments (if applicable):   Patient was here yesterday, please cancel the [facility] order placed yesterday. Please fill for the [facility] listed above.

## 2017-05-08 NOTE — Telephone Encounter (Signed)
Pt's pen needles have been refilled.

## 2017-05-08 NOTE — Telephone Encounter (Signed)
Please see below.  Thank you.

## 2017-05-17 ENCOUNTER — Ambulatory Visit (INDEPENDENT_AMBULATORY_CARE_PROVIDER_SITE_OTHER): Payer: Medicare HMO | Admitting: Family Medicine

## 2017-05-17 ENCOUNTER — Encounter: Payer: Self-pay | Admitting: Family Medicine

## 2017-05-17 VITALS — BP 128/78 | HR 85 | Temp 98.3°F | Resp 16 | Ht 64.0 in | Wt 177.2 lb

## 2017-05-17 DIAGNOSIS — J029 Acute pharyngitis, unspecified: Secondary | ICD-10-CM | POA: Diagnosis not present

## 2017-05-17 DIAGNOSIS — J301 Allergic rhinitis due to pollen: Secondary | ICD-10-CM

## 2017-05-17 LAB — POCT RAPID STREP A (OFFICE): Rapid Strep A Screen: NEGATIVE

## 2017-05-17 MED ORDER — AMOXICILLIN 875 MG PO TABS: 875.0000 mg | ORAL_TABLET | Freq: Two times a day (BID) | ORAL | 0 refills | Status: DC

## 2017-05-17 MED ORDER — LEVOCETIRIZINE DIHYDROCHLORIDE 5 MG PO TABS: 5.0000 mg | ORAL_TABLET | Freq: Every evening | ORAL | 5 refills | Status: DC

## 2017-05-17 NOTE — Progress Notes (Signed)
Patient ID: Mandy Kim, female   DOB: 03-03-1946, 71 y.o.   MRN: [id number]     Subjective:  I acted as a Education administrator for Dr. Carollee Kim.  Mandy Kim, Mandy Kim   Patient ID: Mandy Kim, female    DOB: 04/18/1946, 71 y.o.   MRN: [id number]  Chief Complaint  Patient presents with  . Sore Throat    Sore Throat   This is a new problem. The current episode started yesterday. The problem has been gradually worsening. Sore throat worse side: both sides. There has been no fever. The pain is severe. Associated symptoms include coughing. Pertinent negatives include no congestion, headaches, shortness of breath or vomiting. Treatments tried: throat spray. The treatment provided no relief.    Patient is in today for sore throat.  Patient Care Team: Mandy Kim, Mandy Filler, MD as PCP - General (Family Medicine)   Past Medical History:  Diagnosis Date  . Allergy   . Cancer ([place])   . Cataract   . Chronic kidney disease   . Diabetes mellitus   . Diverticulitis   . GERD (gastroesophageal reflux disease)   . Hyperlipidemia   . Hypertension   . Sleep apnea    has c pap machine    Past Surgical History:  Procedure Laterality Date  . ABDOMINAL AORTIC ANEURYSM REPAIR    . ABDOMINAL AORTIC ANEURYSM REPAIR    . ABDOMINAL HYSTERECTOMY  2000  . Bladder tacking    . BREAST SURGERY    . CATARACT EXTRACTION, BILATERAL    . EYE SURGERY    . MASTECTOMY    . TONSILLECTOMY      Family History  Problem Relation Age of Onset  . Cancer Mother   . Pneumonia Mother   . Thyroid disease Mother   . Thyroid disease Sister   . Obesity Daughter   . Hypertension Daughter   . Thyroid disease Son   . Pneumonia Maternal Grandmother   . Heart attack Maternal Grandfather   . Diabetes Neg Hx     Social History   Social History  . Marital status: Married    Spouse name: N/A  . Number of children: N/A  . Years of education: N/A   Occupational History  . Not on file.   Social History Main Topics  .  Smoking status: Former Smoker    Years: 30.00    Types: Cigarettes    Quit date: 06/07/1999  . Smokeless tobacco: Never Used  . Alcohol use Not on file  . Drug use: Unknown  . Sexual activity: Yes   Other Topics Concern  . Not on file   Social History Narrative   Lives with husband in [place].   Occupation: truck Geophysicist/field seismologist.    Outpatient Medications Prior to Visit  Medication Sig Dispense Refill  . Alcohol Swabs PADS Use to test blood sugar 3 times daily. Dx: E11.9 300 each 2  . Ascorbic Acid (CHEWABLE VITAMIN C PO) Take by mouth.    Marland Kitchen b complex vitamins tablet Take 1 tablet by mouth daily.    . Biotin 1000 MCG tablet Take 1,000 mcg by mouth daily.    . Blood Glucose Calibration (GLUCOSE CONTROL) SOLN Use to test blood sugar 3 times daily. Dx: E11.9 1 each 2  . Blood Glucose Monitoring Suppl (TRUE METRIX AIR GLUCOSE METER) w/Device KIT USE  TO TEST BLOOD SUGAR THREE TIMES DAILY 1 kit 0  . cholecalciferol (VITAMIN D) 1000 UNITS tablet Take 1,000 Units by mouth daily.    Marland Kitchen  clopidogrel (PLAVIX) 75 MG tablet TAKE 1 TABLET (75 MG TOTAL) BY MOUTH DAILY. 90 tablet 3  . gabapentin (NEURONTIN) 100 MG capsule Take 1 capsule (100 mg total) by mouth at bedtime. Can increase to 200 or 300 mg as needed for foot pain 90 capsule 3  . gemfibrozil (LOPID) 600 MG tablet TAKE 1 TABLET (600 MG TOTAL) BY MOUTH 2 (TWO) TIMES DAILY BEFORE A MEAL. 180 tablet 3  . Insulin Glargine (LANTUS) 100 UNIT/ML Solostar Pen Inject 80 Units into the skin every morning. And pen needles 1/day 90 mL 3  . Insulin Pen Needle (PEN NEEDLES) 32G X 6 MM MISC 1 each by Does not apply route daily. 100 each 3  . KLOR-CON M20 20 MEQ tablet TAKE 1 TABLET (20 MEQ TOTAL) BY MOUTH DAILY. 90 tablet 3  . metFORMIN (GLUCOPHAGE) 1000 MG tablet TAKE 1 TABLET (500 MG TOTAL) BY MOUTH 2 (TWO) TIMES DAILY WITH A MEAL. 180 tablet 1  . Multiple Vitamin (MULTIVITAMIN) tablet Take 0.5 tablets by mouth 2 (two) times daily.     . niacin (NIASPAN) 500  MG CR tablet Take 1 tablet (500 mg total) by mouth at bedtime. 30 tablet 3  . pantoprazole (PROTONIX) 40 MG tablet TAKE 1 TABLET BY MOUTH EVERY DAY ON AN EMPTY STOMACH 90 tablet 3  . TRUE METRIX BLOOD GLUCOSE TEST test strip USE AS DIRECTED TO TEST BLOOD SUGAR THREE TIMES DAILY 1 each 0  . cetirizine (ZYRTEC) 10 MG chewable tablet Chew 1 tablet (10 mg total) by mouth daily. 90 tablet 3   No facility-administered medications prior to visit.     Allergies  Allergen Reactions  . Aspirin     Per pt any pain medications  . Crestor [Rosuvastatin Calcium]   . Lipitor [Atorvastatin Calcium]   . Pravastatin   . Simvastatin   . Trulicity [Dulaglutide] Other (See Comments)    flatulance    Review of Systems  Constitutional: Negative for fever and malaise/fatigue.  HENT: Negative for congestion.        Runny nose,  Drainage, sneezing   Eyes: Negative for blurred vision.  Respiratory: Positive for cough. Negative for shortness of breath.   Cardiovascular: Negative for chest pain, palpitations and leg swelling.  Gastrointestinal: Negative for vomiting.  Musculoskeletal: Negative for back pain.  Skin: Negative for rash.  Neurological: Negative for loss of consciousness and headaches.       Objective:    Physical Exam  Constitutional: She is oriented to person, place, and time. She appears well-developed and well-nourished.  HENT:  Right Ear: External ear normal.  Left Ear: External ear normal.  Mouth/Throat: Posterior oropharyngeal erythema present. No oropharyngeal exudate.  + PND + errythema  Eyes: Conjunctivae are normal. Right eye exhibits no discharge. Left eye exhibits no discharge.  Cardiovascular: Normal rate, regular rhythm and normal heart sounds.   No murmur heard. Pulmonary/Chest: Effort normal and breath sounds normal. No respiratory distress. She has no wheezes. She has no rales. She exhibits no tenderness.  Musculoskeletal: She exhibits no edema.  Lymphadenopathy:     She has no cervical adenopathy.  Neurological: She is alert and oriented to person, place, and time.  Psychiatric: She has a normal mood and affect. Her behavior is normal. Judgment and thought content normal.  Nursing note and vitals reviewed.   BP 128/78 (BP Location: Left Arm, Cuff Size: Normal)   Pulse 85   Temp 98.3 F (36.8 C) (Oral)   Resp 16   Ht  5' 4" (1.626 m)   Wt 177 lb 3.2 oz (80.4 kg)   SpO2 93%   BMI 30.42 kg/m  Wt Readings from Last 3 Encounters:  05/17/17 177 lb 3.2 oz (80.4 kg)  05/07/17 180 lb (81.6 kg)  05/02/17 179 lb (81.2 kg)   BP Readings from Last 3 Encounters:  05/17/17 128/78  05/07/17 (!) 152/68  05/02/17 110/72     Immunization History  Administered Date(s) Administered  . Pneumococcal Conjugate-13 07/27/2014  . Pneumococcal Polysaccharide-23 02/25/2012  . Td 03/08/2017    Health Maintenance  Topic Date Due  . INFLUENZA VACCINE  07/04/2017  . HEMOGLOBIN A1C  09/07/2017  . URINE MICROALBUMIN  09/07/2017  . OPHTHALMOLOGY EXAM  12/26/2017  . FOOT EXAM  05/07/2018  . COLONOSCOPY  10/29/2022  . TETANUS/TDAP  03/09/2027  . DEXA SCAN  Completed  . Hepatitis C Screening  Completed  . PNA vac Low Risk Adult  Completed    Lab Results  Component Value Date   WBC 6.3 09/07/2016   HGB 13.3 09/07/2016   HCT 39.1 09/07/2016   PLT 295.0 09/07/2016   GLUCOSE 177 (H) 03/08/2017   CHOL 180 09/07/2016   TRIG 336.0 (H) 09/07/2016   HDL 26.10 (L) 09/07/2016   LDLDIRECT 100.0 09/07/2016   LDLCALC 82 08/18/2015   ALT 69 (H) 03/08/2017   AST 88 (H) 03/08/2017   NA 136 03/08/2017   K 4.0 03/08/2017   CL 101 03/08/2017   CREATININE 0.79 03/08/2017   BUN 17 03/08/2017   CO2 26 03/08/2017   TSH 4.15 09/07/2016   HGBA1C 10.9 (H) 03/08/2017   MICROALBUR 2.0 (H) 09/07/2016    Lab Results  Component Value Date   TSH 4.15 09/07/2016   Lab Results  Component Value Date   WBC 6.3 09/07/2016   HGB 13.3 09/07/2016   HCT 39.1 09/07/2016   MCV  86.3 09/07/2016   PLT 295.0 09/07/2016   Lab Results  Component Value Date   NA 136 03/08/2017   K 4.0 03/08/2017   CO2 26 03/08/2017   GLUCOSE 177 (H) 03/08/2017   BUN 17 03/08/2017   CREATININE 0.79 03/08/2017   BILITOT 0.6 03/08/2017   ALKPHOS 44 03/08/2017   AST 88 (H) 03/08/2017   ALT 69 (H) 03/08/2017   PROT 8.0 03/08/2017   ALBUMIN 4.6 03/08/2017   CALCIUM 9.8 03/08/2017   GFR 75.95 03/08/2017   Lab Results  Component Value Date   CHOL 180 09/07/2016   Lab Results  Component Value Date   HDL 26.10 (L) 09/07/2016   Lab Results  Component Value Date   LDLCALC 82 08/18/2015   Lab Results  Component Value Date   TRIG 336.0 (H) 09/07/2016   Lab Results  Component Value Date   CHOLHDL 7 09/07/2016   Lab Results  Component Value Date   HGBA1C 10.9 (H) 03/08/2017         Assessment & Plan:   Problem List Items Addressed This Visit    None    Visit Diagnoses    Seasonal allergic rhinitis due to pollen    -  Primary   Relevant Medications   levocetirizine (XYZAL) 5 MG tablet   Sore throat       Relevant Medications   levocetirizine (XYZAL) 5 MG tablet   amoxicillin (AMOXIL) 875 MG tablet   Other Relevant Orders   POCT rapid strep A (Completed)   Culture, Group A Strep      I am having Ms.  Bommarito start on levocetirizine and amoxicillin. I am also having her maintain her cholecalciferol, multivitamin, Biotin, b complex vitamins, niacin, Alcohol Swabs, Glucose Control, TRUE METRIX AIR GLUCOSE METER, pantoprazole, TRUE METRIX BLOOD GLUCOSE TEST, metFORMIN, clopidogrel, gabapentin, KLOR-CON M20, gemfibrozil, Ascorbic Acid (CHEWABLE VITAMIN C PO), Insulin Glargine, Pen Needles, and cetirizine.  Meds ordered this encounter  Medications  . cetirizine (ZYRTEC) 10 MG tablet    Sig: Take 10 mg by mouth daily.  Marland Kitchen levocetirizine (XYZAL) 5 MG tablet    Sig: Take 1 tablet (5 mg total) by mouth every evening.    Dispense:  30 tablet    Refill:  5  .  amoxicillin (AMOXIL) 875 MG tablet    Sig: Take 1 tablet (875 mg total) by mouth 2 (two) times daily.    Dispense:  20 tablet    Refill:  0    CMA served as scribe during this visit. History, Physical and Plan performed by medical provider. Documentation and orders reviewed and attested to.  Ann Held, DO

## 2017-05-17 NOTE — Patient Instructions (Signed)

## 2017-05-19 LAB — CULTURE, GROUP A STREP

## 2017-05-22 ENCOUNTER — Telehealth: Payer: Self-pay | Admitting: Endocrinology

## 2017-05-22 ENCOUNTER — Other Ambulatory Visit: Payer: Self-pay

## 2017-05-22 DIAGNOSIS — E1165 Type 2 diabetes mellitus with hyperglycemia: Principal | ICD-10-CM

## 2017-05-22 DIAGNOSIS — Z794 Long term (current) use of insulin: Principal | ICD-10-CM

## 2017-05-22 DIAGNOSIS — IMO0001 Reserved for inherently not codable concepts without codable children: Secondary | ICD-10-CM

## 2017-05-22 MED ORDER — "INSULIN SYRINGE 30G X 5/16"" 1 ML MISC": 2 refills | Status: AC

## 2017-05-22 MED ORDER — INSULIN GLARGINE 100 UNIT/ML ~~LOC~~ SOLN: 80.0000 [IU] | Freq: Every day | SUBCUTANEOUS | 4 refills | Status: DC

## 2017-05-22 NOTE — Telephone Encounter (Signed)
Patient called to get rx filled that she states has not been filled. Patient would not give any further information. Transferred call to [facility] to advise.

## 2017-05-22 NOTE — Telephone Encounter (Signed)
I spoke with the patient. She requested the Lantus insulin to be changed from pens to vials and for a prescription of syringes to be submitted to the pharmacy. I advised rx has been submitted to [facility] per her request. Patient had not further questions at this time.

## 2017-05-28 ENCOUNTER — Emergency Department (HOSPITAL_BASED_OUTPATIENT_CLINIC_OR_DEPARTMENT_OTHER): Payer: Medicare HMO

## 2017-05-28 ENCOUNTER — Inpatient Hospital Stay (HOSPITAL_BASED_OUTPATIENT_CLINIC_OR_DEPARTMENT_OTHER)
Admission: EM | Admit: 2017-05-28 | Discharge: 2017-05-30 | DRG: 872 | Disposition: A | Discharge status: Home / Self Care | Payer: Medicare HMO | Attending: Internal Medicine | Admitting: Internal Medicine

## 2017-05-28 ENCOUNTER — Encounter (HOSPITAL_BASED_OUTPATIENT_CLINIC_OR_DEPARTMENT_OTHER): Payer: Self-pay | Admitting: Emergency Medicine

## 2017-05-28 DIAGNOSIS — Z794 Long term (current) use of insulin: Secondary | ICD-10-CM | POA: Diagnosis not present

## 2017-05-28 DIAGNOSIS — Z8679 Personal history of other diseases of the circulatory system: Secondary | ICD-10-CM

## 2017-05-28 DIAGNOSIS — Z8349 Family history of other endocrine, nutritional and metabolic diseases: Secondary | ICD-10-CM | POA: Diagnosis not present

## 2017-05-28 DIAGNOSIS — Z853 Personal history of malignant neoplasm of breast: Secondary | ICD-10-CM | POA: Diagnosis not present

## 2017-05-28 DIAGNOSIS — B348 Other viral infections of unspecified site: Secondary | ICD-10-CM | POA: Diagnosis not present

## 2017-05-28 DIAGNOSIS — E1140 Type 2 diabetes mellitus with diabetic neuropathy, unspecified: Secondary | ICD-10-CM | POA: Diagnosis present

## 2017-05-28 DIAGNOSIS — Z87440 Personal history of urinary (tract) infections: Secondary | ICD-10-CM | POA: Diagnosis not present

## 2017-05-28 DIAGNOSIS — E860 Dehydration: Secondary | ICD-10-CM | POA: Diagnosis not present

## 2017-05-28 DIAGNOSIS — R509 Fever, unspecified: Secondary | ICD-10-CM | POA: Diagnosis not present

## 2017-05-28 DIAGNOSIS — Z79899 Other long term (current) drug therapy: Secondary | ICD-10-CM | POA: Diagnosis not present

## 2017-05-28 DIAGNOSIS — R0902 Hypoxemia: Secondary | ICD-10-CM | POA: Diagnosis present

## 2017-05-28 DIAGNOSIS — B9629 Other Escherichia coli [E. coli] as the cause of diseases classified elsewhere: Secondary | ICD-10-CM | POA: Diagnosis not present

## 2017-05-28 DIAGNOSIS — R5381 Other malaise: Secondary | ICD-10-CM

## 2017-05-28 DIAGNOSIS — Z87891 Personal history of nicotine dependence: Secondary | ICD-10-CM | POA: Diagnosis not present

## 2017-05-28 DIAGNOSIS — A4151 Sepsis due to Escherichia coli [E. coli]: Principal | ICD-10-CM | POA: Diagnosis present

## 2017-05-28 DIAGNOSIS — E1165 Type 2 diabetes mellitus with hyperglycemia: Secondary | ICD-10-CM | POA: Diagnosis not present

## 2017-05-28 DIAGNOSIS — Z7902 Long term (current) use of antithrombotics/antiplatelets: Secondary | ICD-10-CM | POA: Diagnosis not present

## 2017-05-28 DIAGNOSIS — A419 Sepsis, unspecified organism: Secondary | ICD-10-CM

## 2017-05-28 DIAGNOSIS — R945 Abnormal results of liver function studies: Secondary | ICD-10-CM

## 2017-05-28 DIAGNOSIS — R000 Tachycardia, unspecified: Secondary | ICD-10-CM | POA: Diagnosis present

## 2017-05-28 DIAGNOSIS — R0602 Shortness of breath: Secondary | ICD-10-CM | POA: Diagnosis not present

## 2017-05-28 DIAGNOSIS — G4733 Obstructive sleep apnea (adult) (pediatric): Secondary | ICD-10-CM | POA: Diagnosis present

## 2017-05-28 DIAGNOSIS — B9789 Other viral agents as the cause of diseases classified elsewhere: Secondary | ICD-10-CM | POA: Diagnosis present

## 2017-05-28 DIAGNOSIS — R51 Headache: Secondary | ICD-10-CM | POA: Diagnosis not present

## 2017-05-28 DIAGNOSIS — R102 Pelvic and perineal pain: Secondary | ICD-10-CM | POA: Diagnosis not present

## 2017-05-28 DIAGNOSIS — N390 Urinary tract infection, site not specified: Secondary | ICD-10-CM | POA: Diagnosis present

## 2017-05-28 DIAGNOSIS — J9601 Acute respiratory failure with hypoxia: Secondary | ICD-10-CM

## 2017-05-28 DIAGNOSIS — E119 Type 2 diabetes mellitus without complications: Secondary | ICD-10-CM

## 2017-05-28 DIAGNOSIS — E0800 Diabetes mellitus due to underlying condition with hyperosmolarity without nonketotic hyperglycemic-hyperosmolar coma (NKHHC): Secondary | ICD-10-CM | POA: Diagnosis not present

## 2017-05-28 DIAGNOSIS — J069 Acute upper respiratory infection, unspecified: Secondary | ICD-10-CM | POA: Diagnosis not present

## 2017-05-28 DIAGNOSIS — R5383 Other fatigue: Secondary | ICD-10-CM

## 2017-05-28 DIAGNOSIS — K760 Fatty (change of) liver, not elsewhere classified: Secondary | ICD-10-CM | POA: Diagnosis present

## 2017-05-28 DIAGNOSIS — E785 Hyperlipidemia, unspecified: Secondary | ICD-10-CM | POA: Diagnosis present

## 2017-05-28 DIAGNOSIS — R7989 Other specified abnormal findings of blood chemistry: Secondary | ICD-10-CM | POA: Diagnosis present

## 2017-05-28 DIAGNOSIS — Z8249 Family history of ischemic heart disease and other diseases of the circulatory system: Secondary | ICD-10-CM

## 2017-05-28 DIAGNOSIS — R519 Headache, unspecified: Secondary | ICD-10-CM | POA: Diagnosis present

## 2017-05-28 DIAGNOSIS — J449 Chronic obstructive pulmonary disease, unspecified: Secondary | ICD-10-CM | POA: Diagnosis not present

## 2017-05-28 HISTORY — DX: Other specified abnormal findings of blood chemistry: R79.89

## 2017-05-28 HISTORY — DX: Sepsis, unspecified organism: A41.9

## 2017-05-28 HISTORY — DX: Urinary tract infection, site not specified: N39.0

## 2017-05-28 HISTORY — DX: Dehydration: E86.0

## 2017-05-28 LAB — COMPREHENSIVE METABOLIC PANEL
ALT: 77 U/L — AB (ref 14–54)
AST: 121 U/L — AB (ref 15–41)
Albumin: 4.2 g/dL (ref 3.5–5.0)
Alkaline Phosphatase: 40 U/L (ref 38–126)
Anion gap: 12 (ref 5–15)
BILIRUBIN TOTAL: 0.7 mg/dL (ref 0.3–1.2)
BUN: 16 mg/dL (ref 6–20)
CHLORIDE: 99 mmol/L — AB (ref 101–111)
CO2: 25 mmol/L (ref 22–32)
CREATININE: 0.96 mg/dL (ref 0.44–1.00)
Calcium: 9.7 mg/dL (ref 8.9–10.3)
GFR calc Af Amer: >60 mL/min (ref >60)
GFR, EST NON AFRICAN AMERICAN: 58 mL/min — AB (ref >60)
GLUCOSE: 151 mg/dL — AB (ref 65–99)
Potassium: 4 mmol/L (ref 3.5–5.1)
Sodium: 136 mmol/L (ref 135–145)
Total Protein: 8.3 g/dL — ABNORMAL HIGH (ref 6.5–8.1)

## 2017-05-28 LAB — CBC WITH DIFFERENTIAL/PLATELET
BASOS ABS: 0 10*3/uL (ref 0.0–0.1)
Basophils Relative: 1 %
Eosinophils Absolute: 0 10*3/uL (ref 0.0–0.7)
Eosinophils Relative: 0 %
HCT: 42.6 % (ref 36.0–46.0)
Hemoglobin: 14.7 g/dL (ref 12.0–15.0)
LYMPHS PCT: 26 %
Lymphs Abs: 1.3 10*3/uL (ref 0.7–4.0)
MCH: 29.5 pg (ref 26.0–34.0)
MCHC: 34.5 g/dL (ref 30.0–36.0)
MCV: 85.4 fL (ref 78.0–100.0)
MONO ABS: 0.2 10*3/uL (ref 0.1–1.0)
MONOS PCT: 5 %
Neutro Abs: 3.4 10*3/uL (ref 1.7–7.7)
Neutrophils Relative %: 69 %
PLATELETS: 217 10*3/uL (ref 150–400)
RBC: 4.99 MIL/uL (ref 3.87–5.11)
RDW: 15.6 % — AB (ref 11.5–15.5)
WBC: 4.9 10*3/uL (ref 4.0–10.5)

## 2017-05-28 LAB — URINALYSIS, ROUTINE W REFLEX MICROSCOPIC
BILIRUBIN URINE: NEGATIVE
GLUCOSE, UA: NEGATIVE mg/dL
Ketones, ur: NEGATIVE mg/dL
Nitrite: POSITIVE — AB
PH: 5.5 (ref 5.0–8.0)
Protein, ur: NEGATIVE mg/dL
SPECIFIC GRAVITY, URINE: 1.017 (ref 1.005–1.030)

## 2017-05-28 LAB — URINALYSIS, MICROSCOPIC (REFLEX)

## 2017-05-28 LAB — PROCALCITONIN: PROCALCITONIN: 0.22 ng/mL

## 2017-05-28 LAB — LACTIC ACID, PLASMA
LACTIC ACID, VENOUS: 0.9 mmol/L (ref 0.5–1.9)
Lactic Acid, Venous: 0.9 mmol/L (ref 0.5–1.9)

## 2017-05-28 LAB — PROTIME-INR
INR: 1.09
Prothrombin Time: 14.1 seconds (ref 11.4–15.2)

## 2017-05-28 LAB — I-STAT CG4 LACTIC ACID, ED
LACTIC ACID, VENOUS: 1.7 mmol/L (ref 0.5–1.9)
Lactic Acid, Venous: 1.5 mmol/L (ref 0.5–1.9)

## 2017-05-28 LAB — GLUCOSE, CAPILLARY
GLUCOSE-CAPILLARY: 124 mg/dL — AB (ref 65–99)
Glucose-Capillary: 130 mg/dL — ABNORMAL HIGH (ref 65–99)

## 2017-05-28 LAB — TROPONIN I: Troponin I: <0.03 ng/mL (ref <0.03)

## 2017-05-28 LAB — D-DIMER, QUANTITATIVE: D-Dimer, Quant: 2.18 ug/mL-FEU — ABNORMAL HIGH (ref 0.00–0.50)

## 2017-05-28 LAB — CK: Total CK: 234 U/L (ref 38–234)

## 2017-05-28 IMAGING — CT CT ABD-PELV W/ CM
2 of 6 series · 13 of 46 positions shown, 15 images · IV contrast (APPLIED)
Comparison: [DATE]

CLINICAL DATA: Bilateral flank pain

EXAM:
CT ABDOMEN AND PELVIS WITH CONTRAST
TECHNIQUE: Multidetector CT imaging of the abdomen and pelvis was performed
using the standard protocol following bolus administration of
intravenous contrast.
CONTRAST:  80mL [DV] IOPAMIDOL ([DV]) INJECTION 61%

[Series 2: axial st · axial · 0.82mm/px · z∈[-443,-53]mm · 10 of 96 slices shown, 12 images]
[im 9/96  soft-tissue]
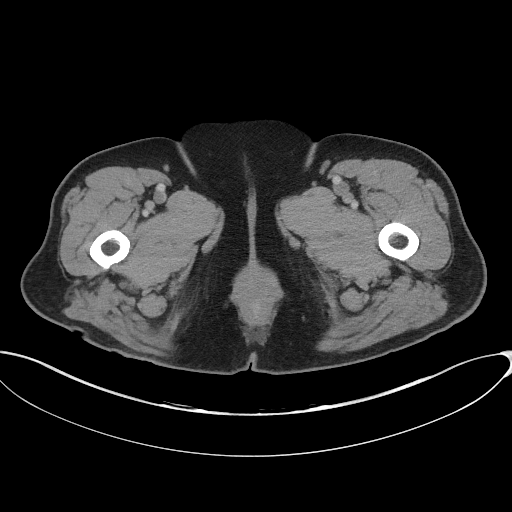
[im 9/96  bone]
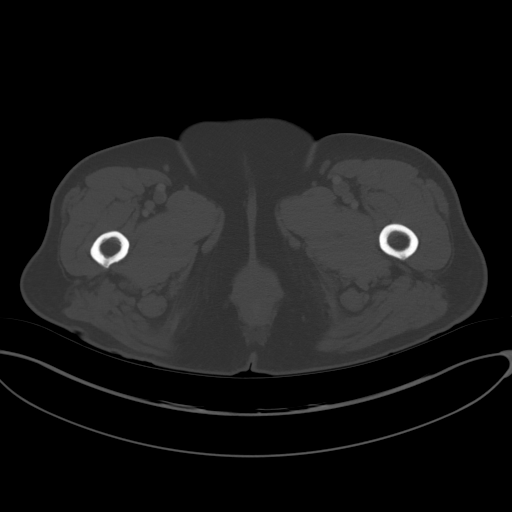
[im 18/96  soft-tissue]
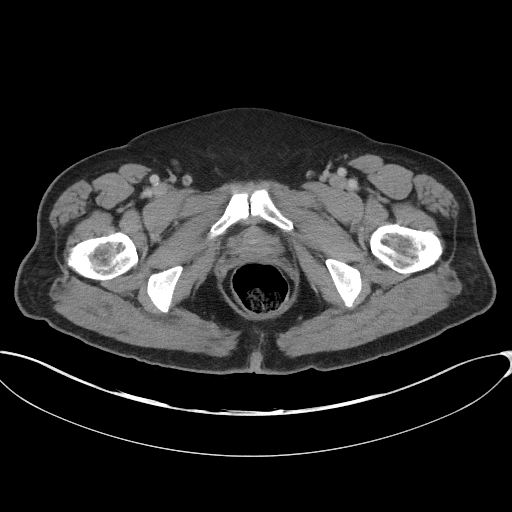
[im 26/96  soft-tissue]
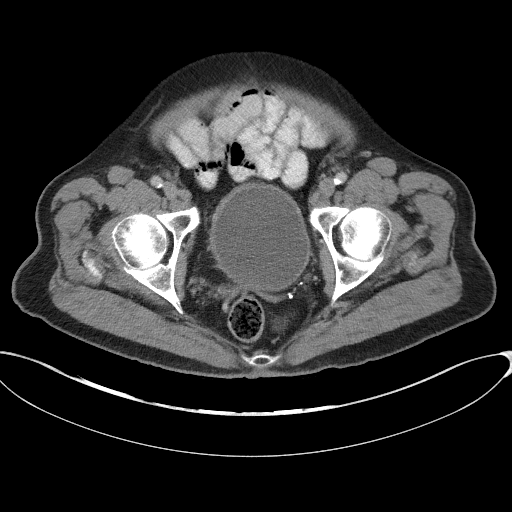
[im 35/96  soft-tissue]
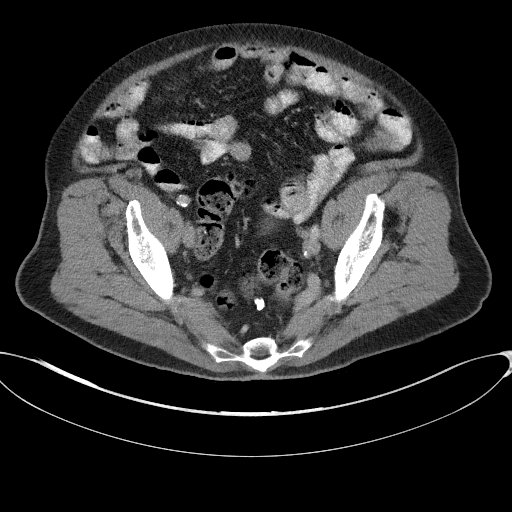
[im 44/96  soft-tissue]
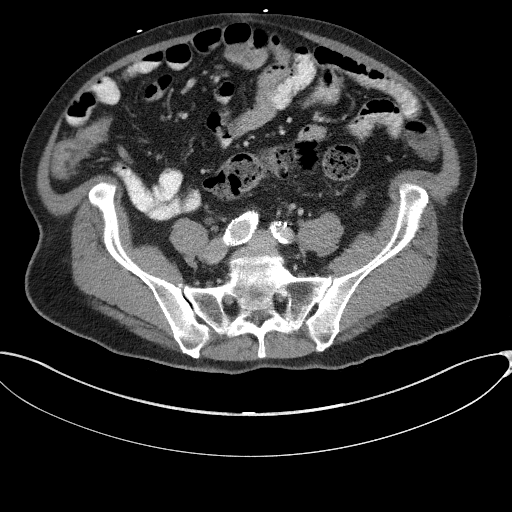
[im 52/96  soft-tissue]
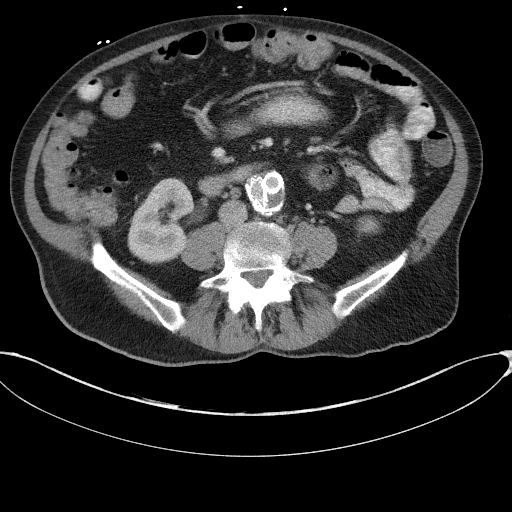
[im 61/96  soft-tissue]
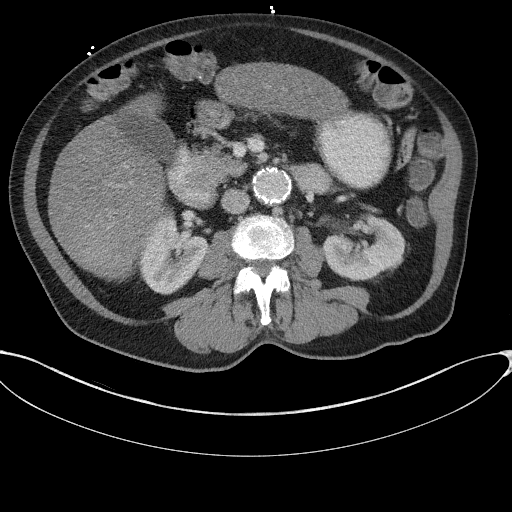
[im 70/96  soft-tissue]
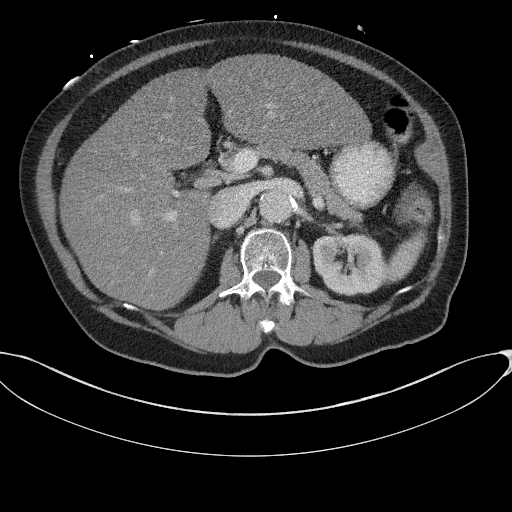
[im 78/96  soft-tissue]
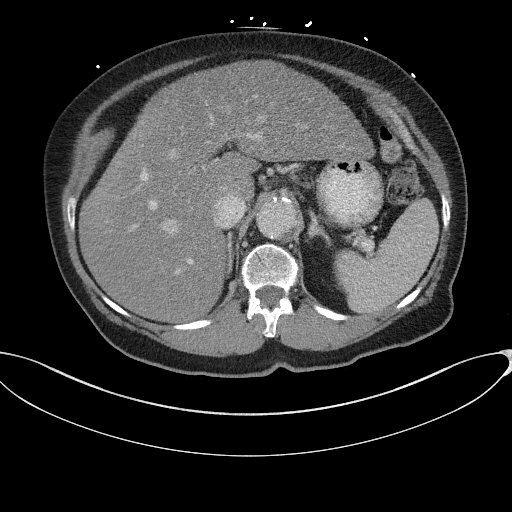
[im 78/96  bone]
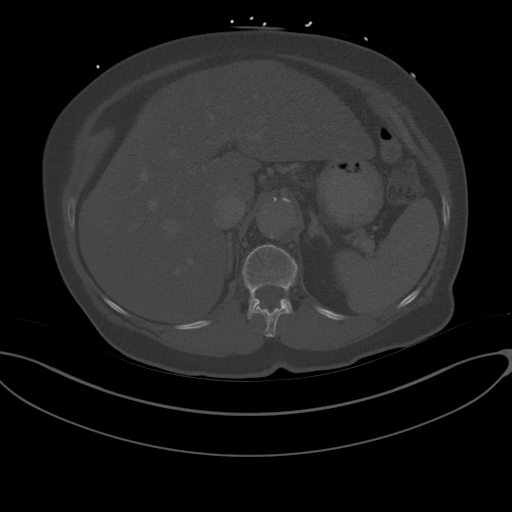
[im 87/96  soft-tissue]
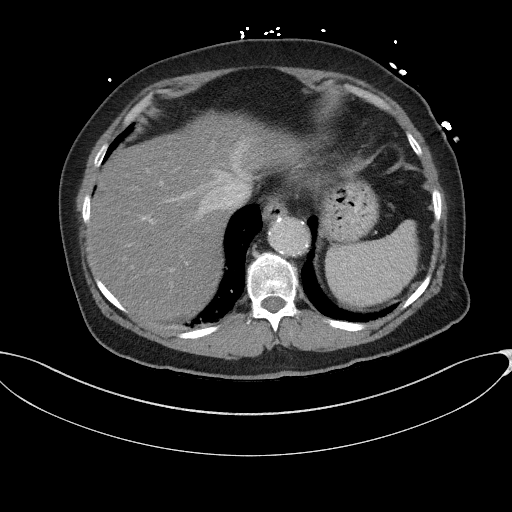

[Series 4: coronal st · coronal · 0.87mm/px · 3 of 107 slices shown]
[im 36/107  soft-tissue]
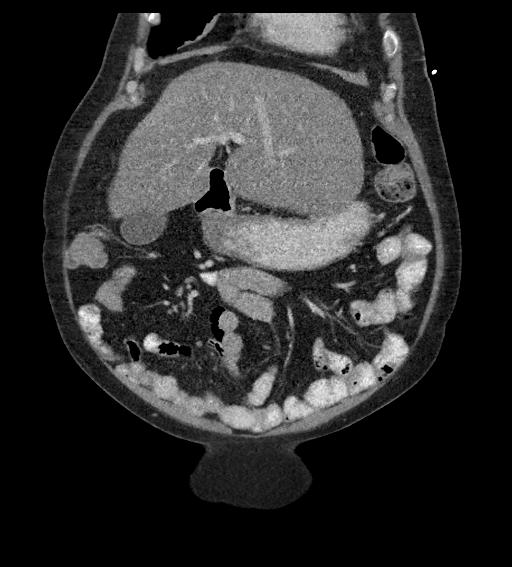
[im 48/107  soft-tissue]
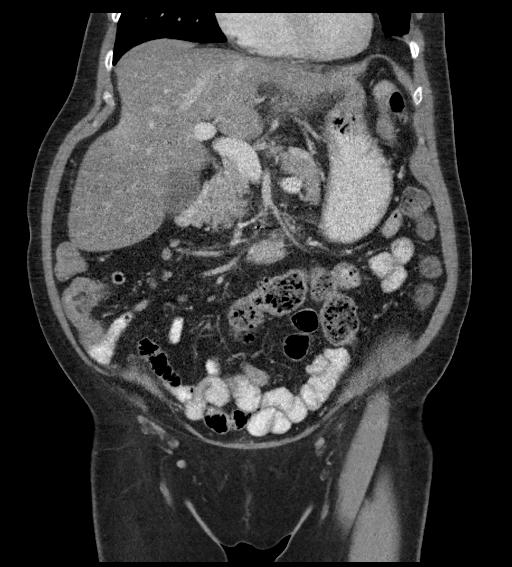
[im 59/107  soft-tissue]
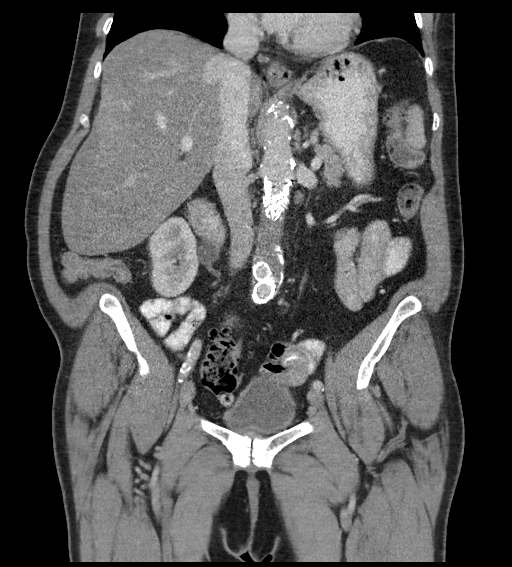

[13 of 46 positions shown; findings below may reference images not displayed]

FINDINGS: Lower chest: Lung bases are free of acute infiltrate or sizable
effusion.

Hepatobiliary: Diffuse fatty infiltration of the liver is noted. The
gallbladder is within normal limits.

Pancreas: Unremarkable. No pancreatic ductal dilatation or
surrounding inflammatory changes.

Spleen: Normal in size without focal abnormality.

Adrenals/Urinary Tract: Adrenal glands are unremarkable. Kidneys are
normal, without renal calculi, focal lesion, or hydronephrosis.
Bladder is unremarkable.

Stomach/Bowel: Mild diverticular change of the colon is noted. No
diverticulitis is seen. The appendix is within normal limits. No
inflammatory changes are noted.

Vascular/Lymphatic: Changes of prior aortic stent graft are noted.
There are no findings to suggest endoleak. No significant
lymphadenopathy is noted.

Reproductive: Status post hysterectomy. No adnexal masses.

Other: No abdominal wall hernia or abnormality. No abdominopelvic
ascites.

Musculoskeletal: No acute or significant osseous findings.
IMPRESSION: Postsurgical changes.  No acute abnormality noted.

## 2017-05-28 IMAGING — DX DG CHEST 2V
2 series · 2 of 2 positions shown · non-contrast
Comparison: Chest CT [DATE].  Chest x-ray [DATE].

CLINICAL DATA: Fever and headache.

EXAM:
CHEST  2 VIEW

[chest lat]
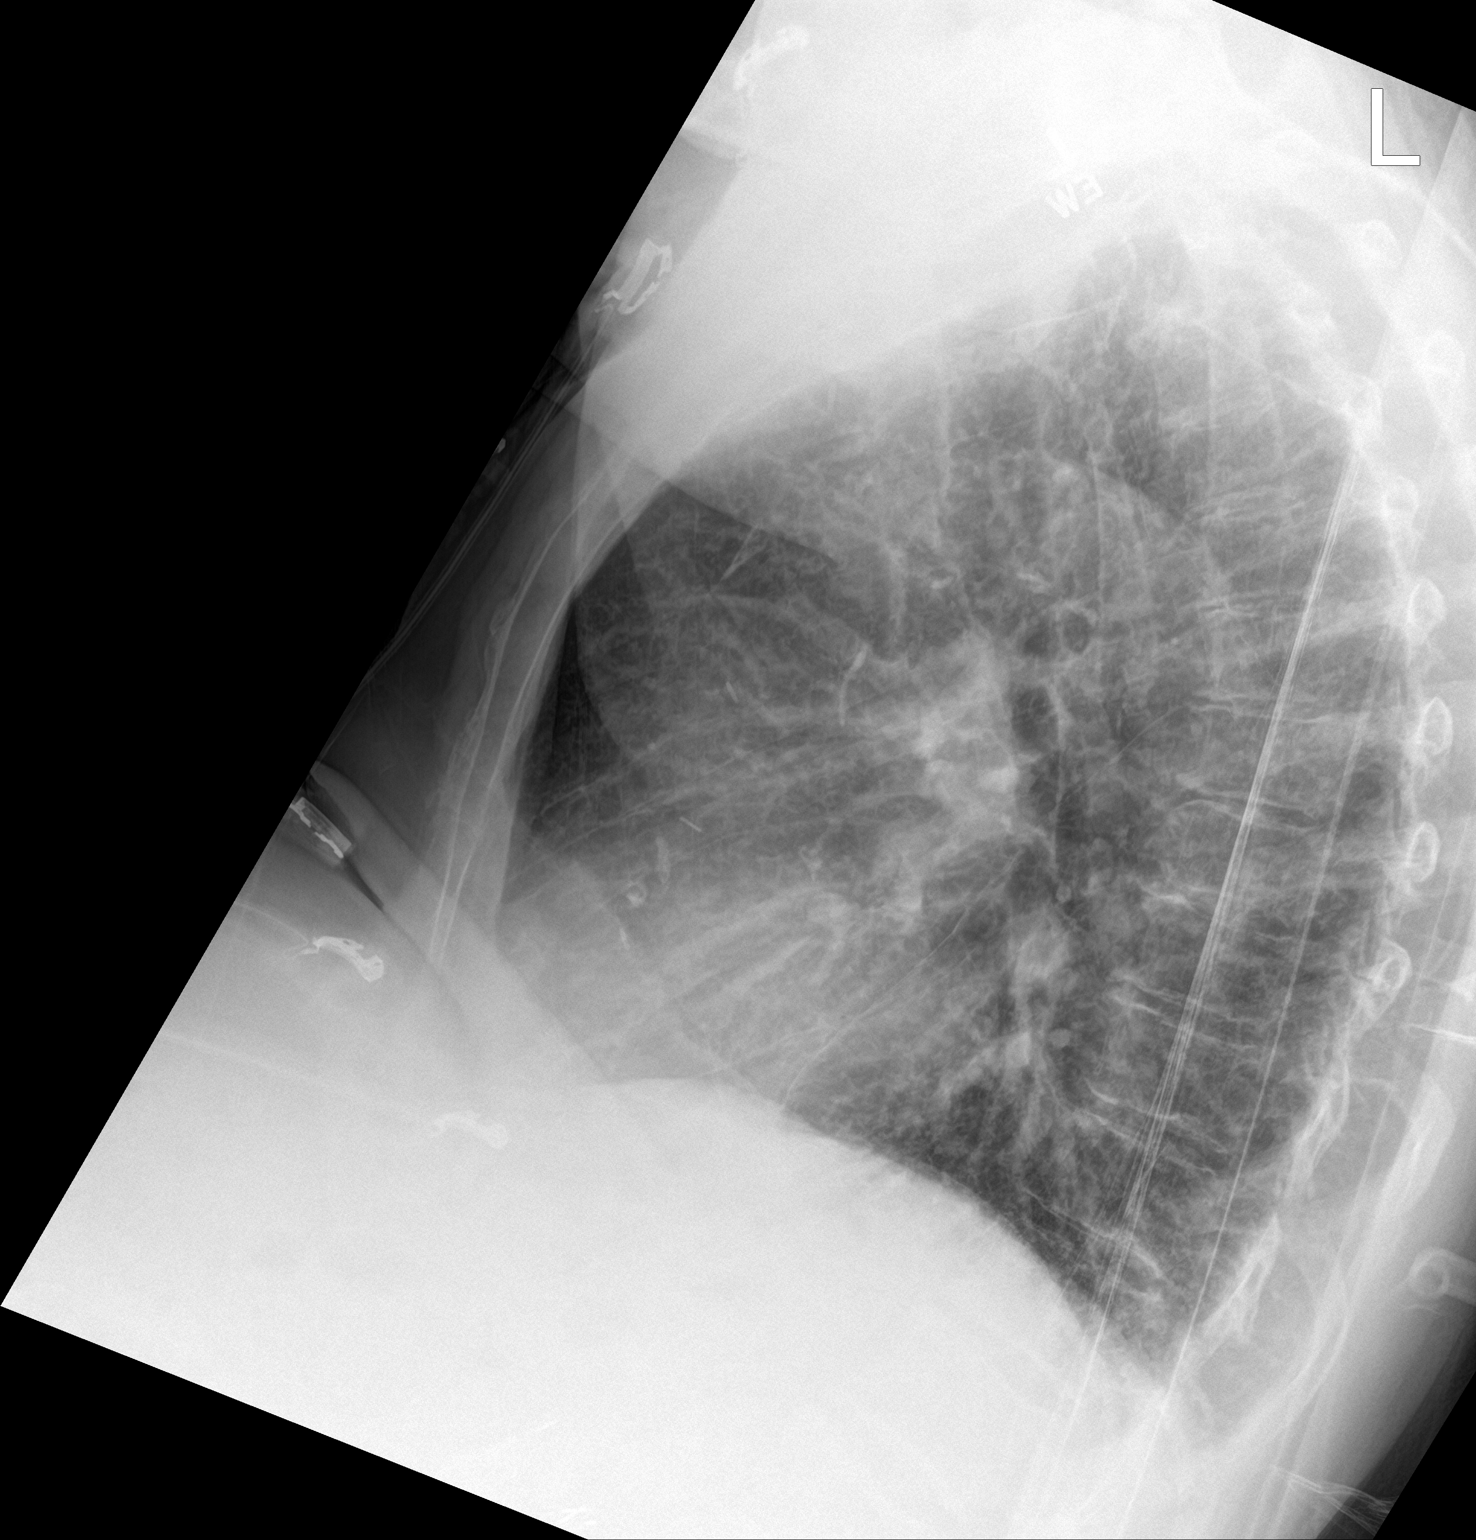

[chest ap strecther]
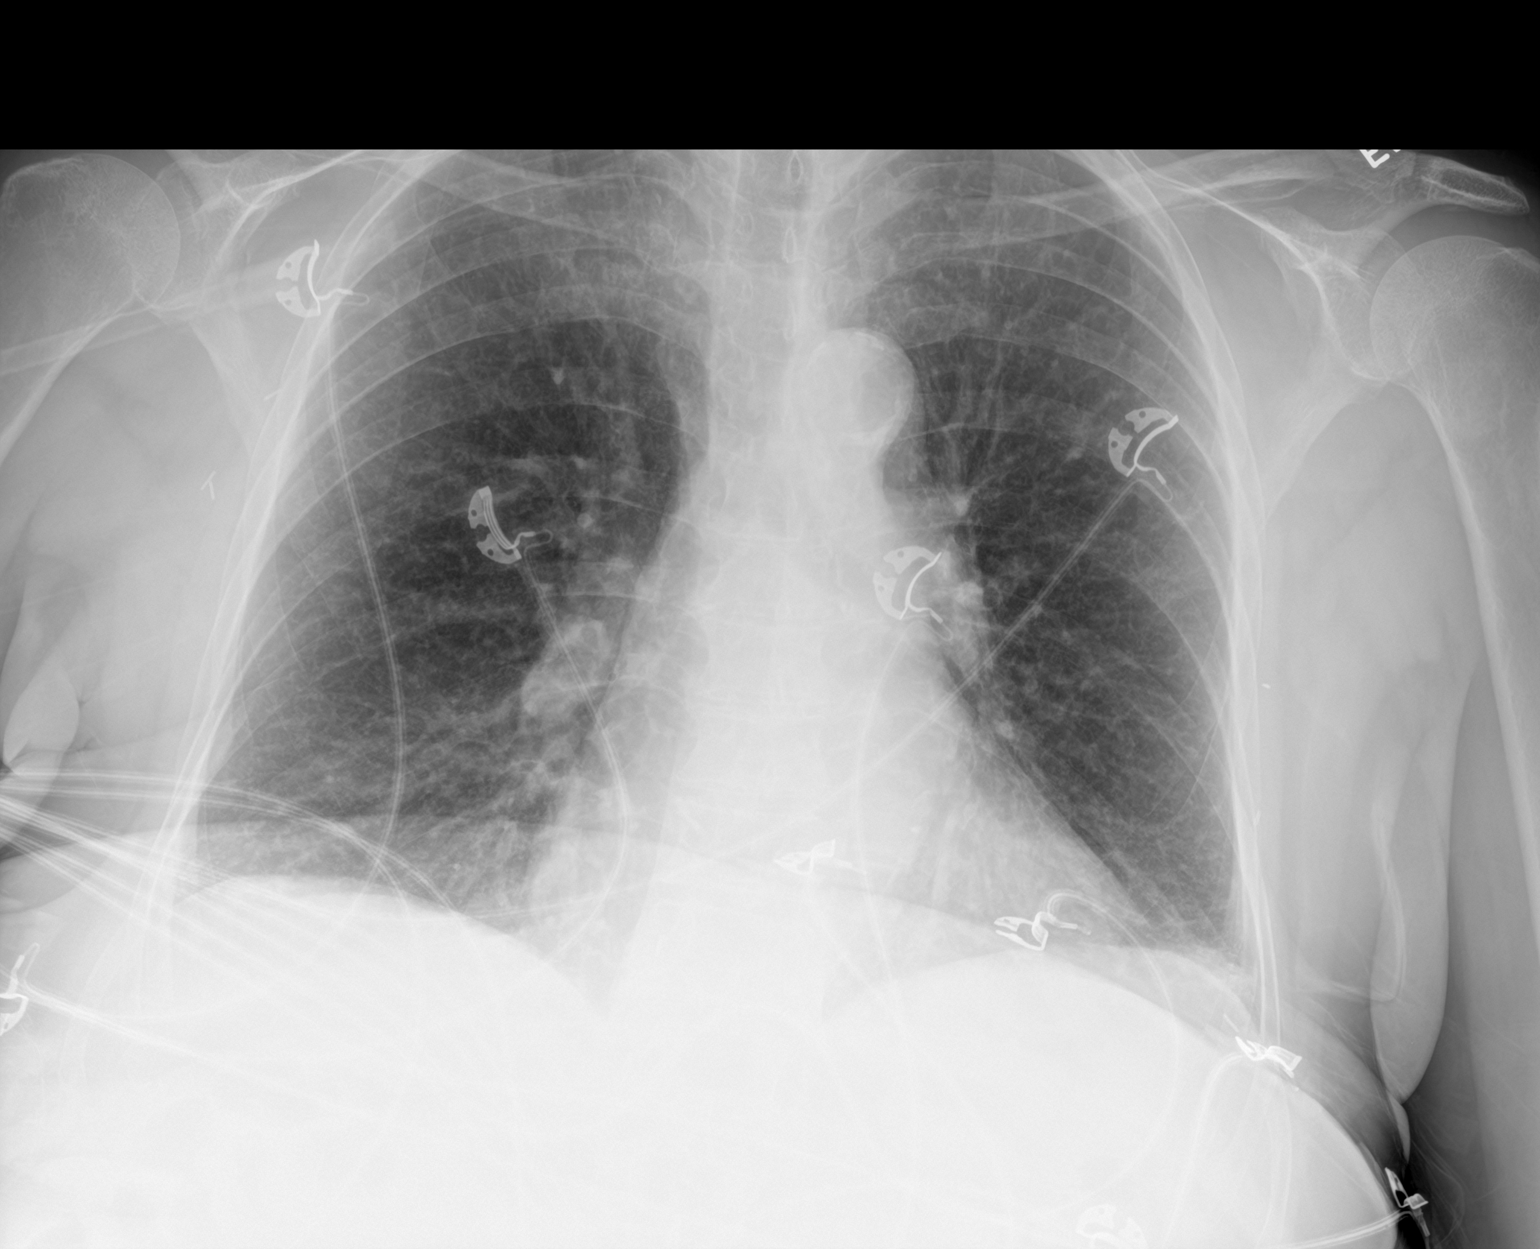

[2 of 2 positions shown; findings below may reference images not displayed]

FINDINGS: AP and lateral views of the chest show hyperexpansion. No focal
airspace consolidation. No pulmonary edema or pleural effusion.
Cardiopericardial silhouette is at upper limits of normal for size.
The visualized bony structures of the thorax are intact. Telemetry
leads overlie the chest.
IMPRESSION: No active cardiopulmonary disease.

## 2017-05-28 MED ORDER — SODIUM CHLORIDE 0.9 % IV BOLUS (SEPSIS)
500.0000 mL | Freq: Once | INTRAVENOUS | Status: AC
  Administered 2017-05-28: 500 mL via INTRAVENOUS

## 2017-05-28 MED ORDER — INSULIN ASPART 100 UNIT/ML ~~LOC~~ SOLN
0.0000 [IU] | SUBCUTANEOUS | Status: DC
  Administered 2017-05-28 – 2017-05-29 (×5): 1 [IU] via SUBCUTANEOUS
  Administered 2017-05-29 – 2017-05-30 (×2): 2 [IU] via SUBCUTANEOUS
  Administered 2017-05-30 (×2): 1 [IU] via SUBCUTANEOUS

## 2017-05-28 MED ORDER — SODIUM CHLORIDE 0.9 % IV SOLN
INTRAVENOUS | Status: AC
  Administered 2017-05-28: 21:00:00 via INTRAVENOUS

## 2017-05-28 MED ORDER — OXYCODONE HCL 5 MG PO TABS: 5.0000 mg | ORAL_TABLET | ORAL | Status: DC | PRN

## 2017-05-28 MED ORDER — ONDANSETRON HCL 4 MG PO TABS: 4.0000 mg | ORAL_TABLET | Freq: Four times a day (QID) | ORAL | Status: DC | PRN

## 2017-05-28 MED ORDER — ONDANSETRON HCL 4 MG/2ML IJ SOLN: 4.0000 mg | Freq: Four times a day (QID) | INTRAMUSCULAR | Status: DC | PRN

## 2017-05-28 MED ORDER — IOPAMIDOL (ISOVUE-300) INJECTION 61%
100.0000 mL | Freq: Once | INTRAVENOUS | Status: AC | PRN
  Administered 2017-05-28: 80 mL via INTRAVENOUS

## 2017-05-28 MED ORDER — GEMFIBROZIL 600 MG PO TABS
600.0000 mg | ORAL_TABLET | Freq: Two times a day (BID) | ORAL | Status: DC
  Administered 2017-05-29 – 2017-05-30 (×3): 600 mg via ORAL
  Filled 2017-05-28 (×4): qty 1

## 2017-05-28 MED ORDER — DEXTROSE 5 % IV SOLN
1.0000 g | INTRAVENOUS | Status: DC
  Administered 2017-05-29: 1 g via INTRAVENOUS
  Filled 2017-05-28 (×2): qty 10

## 2017-05-28 MED ORDER — PANTOPRAZOLE SODIUM 40 MG PO TBEC
40.0000 mg | DELAYED_RELEASE_TABLET | Freq: Every day | ORAL | Status: DC
  Administered 2017-05-29 – 2017-05-30 (×2): 40 mg via ORAL
  Filled 2017-05-28 (×2): qty 1

## 2017-05-28 MED ORDER — DEXTROSE 5 % IV SOLN
1.0000 g | Freq: Once | INTRAVENOUS | Status: AC
  Administered 2017-05-28: 1 g via INTRAVENOUS
  Filled 2017-05-28: qty 10

## 2017-05-28 MED ORDER — SODIUM CHLORIDE 0.9 % IV BOLUS (SEPSIS)
1000.0000 mL | Freq: Once | INTRAVENOUS | Status: AC
  Administered 2017-05-28: 1000 mL via INTRAVENOUS

## 2017-05-28 MED ORDER — CLOPIDOGREL BISULFATE 75 MG PO TABS
75.0000 mg | ORAL_TABLET | Freq: Every day | ORAL | Status: DC
  Administered 2017-05-29 – 2017-05-30 (×2): 75 mg via ORAL
  Filled 2017-05-28 (×2): qty 1

## 2017-05-28 MED ORDER — DOXYCYCLINE HYCLATE 100 MG PO TABS
100.0000 mg | ORAL_TABLET | Freq: Two times a day (BID) | ORAL | Status: DC
  Administered 2017-05-28 – 2017-05-29 (×2): 100 mg via ORAL
  Filled 2017-05-28 (×2): qty 1

## 2017-05-28 MED ORDER — SODIUM CHLORIDE 0.9% FLUSH
3.0000 mL | Freq: Two times a day (BID) | INTRAVENOUS | Status: DC
  Administered 2017-05-28 – 2017-05-30 (×4): 3 mL via INTRAVENOUS

## 2017-05-28 MED ORDER — INSULIN GLARGINE 100 UNIT/ML ~~LOC~~ SOLN
60.0000 [IU] | Freq: Every day | SUBCUTANEOUS | Status: DC
  Filled 2017-05-28: qty 0.6

## 2017-05-28 NOTE — ED Provider Notes (Signed)
[place] DEPT [facility] Provider Note   CSN: [id number] Arrival date & time: 05/28/17  1019     History   Chief Complaint Chief Complaint  Patient presents with  . Headache  . Flank Pain    HPI Mandy Kim is a 71 y.o. female.  The history is provided by the patient, the spouse and medical records. No language interpreter was used.  Flank Pain  This is a new problem. The current episode started more than 2 days ago. The problem occurs constantly. The problem has been gradually worsening. Associated symptoms include abdominal pain. Pertinent negatives include no chest pain, no headaches and no shortness of breath. Nothing aggravates the symptoms. Nothing relieves the symptoms. She has tried nothing for the symptoms. The treatment provided no relief.    Past Medical History:  Diagnosis Date  . Allergy   . Cancer ([place])   . Cataract   . Chronic kidney disease   . Diabetes mellitus   . Diverticulitis   . GERD (gastroesophageal reflux disease)   . Hyperlipidemia   . Hypertension   . Sleep apnea    has c pap machine    Patient Active Problem List   Diagnosis Date Noted  . Peripheral neuropathy 07/27/2014  . Other and unspecified hyperlipidemia 02/12/2012  . AAA (abdominal aortic aneurysm) ([place]) 02/12/2012  . Fatty liver 02/12/2012  . Breast cancer ([place]) 02/12/2012  . Cervical cancer ([place]) 02/12/2012  . Diabetes mellitus 02/12/2012  . Hyperlipidemia 02/12/2012    Past Surgical History:  Procedure Laterality Date  . ABDOMINAL AORTIC ANEURYSM REPAIR    . ABDOMINAL AORTIC ANEURYSM REPAIR    . ABDOMINAL HYSTERECTOMY  2000  . Bladder tacking    . BREAST SURGERY    . CATARACT EXTRACTION, BILATERAL    . EYE SURGERY    . MASTECTOMY    . TONSILLECTOMY      OB History    No data available       Home Medications    Prior to Admission medications   Medication Sig Start Date End Date Taking? Authorizing Provider  Alcohol Swabs PADS Use to test blood sugar 3  times daily. Dx: E11.9 12/20/15   Copland, Gay Filler, MD  amoxicillin (AMOXIL) 875 MG tablet Take 1 tablet (875 mg total) by mouth 2 (two) times daily. 05/17/17   Ann Held, DO  Ascorbic Acid (CHEWABLE VITAMIN C PO) Take by mouth.    [provider]  b complex vitamins tablet Take 1 tablet by mouth daily.    [provider]  Biotin 1000 MCG tablet Take 1,000 mcg by mouth daily.    [provider]  Blood Glucose Calibration (GLUCOSE CONTROL) SOLN Use to test blood sugar 3 times daily. Dx: E11.9 12/20/15   Copland, Gay Filler, MD  Blood Glucose Monitoring Suppl (TRUE METRIX AIR GLUCOSE METER) w/Device KIT USE  TO TEST BLOOD SUGAR THREE TIMES DAILY 02/15/16   Copland, Gay Filler, MD  cetirizine (ZYRTEC) 10 MG tablet Take 10 mg by mouth daily.    [provider]  cholecalciferol (VITAMIN D) 1000 UNITS tablet Take 1,000 Units by mouth daily.    [provider]  clopidogrel (PLAVIX) 75 MG tablet TAKE 1 TABLET (75 MG TOTAL) BY MOUTH DAILY. 04/05/17   Copland, Gay Filler, MD  gabapentin (NEURONTIN) 100 MG capsule Take 1 capsule (100 mg total) by mouth at bedtime. Can increase to 200 or 300 mg as needed for foot pain 04/11/17   Copland,  Gay Filler, MD  gemfibrozil (LOPID) 600 MG tablet TAKE 1 TABLET (600 MG TOTAL) BY MOUTH 2 (TWO) TIMES DAILY BEFORE A MEAL. 04/25/17   Copland, Gay Filler, MD  insulin glargine (LANTUS) 100 UNIT/ML injection Inject 0.8 mLs (80 Units total) into the skin at bedtime. 05/22/17   Renato Shin, MD  Insulin Syringe-Needle U-100 (INSULIN SYRINGE 1CC/30GX5/16") 30G X 5/16" 1 ML MISC Use to inject insulin 1 time per day. 05/22/17   Renato Shin, MD  KLOR-CON M20 20 MEQ tablet TAKE 1 TABLET (20 MEQ TOTAL) BY MOUTH DAILY. 04/24/17   Copland, Gay Filler, MD  levocetirizine (XYZAL) 5 MG tablet Take 1 tablet (5 mg total) by mouth every evening. 05/17/17   Roma Schanz R, DO  metFORMIN (GLUCOPHAGE) 1000 MG tablet TAKE 1 TABLET (500 MG TOTAL) BY  MOUTH 2 (TWO) TIMES DAILY WITH A MEAL. 03/05/17   Copland, Gay Filler, MD  Multiple Vitamin (MULTIVITAMIN) tablet Take 0.5 tablets by mouth 2 (two) times daily.     [provider]  niacin (NIASPAN) 500 MG CR tablet Take 1 tablet (500 mg total) by mouth at bedtime. 08/25/15   Copland, Gay Filler, MD  pantoprazole (PROTONIX) 40 MG tablet TAKE 1 TABLET BY MOUTH EVERY DAY ON AN EMPTY STOMACH 05/15/16   Copland, Gay Filler, MD  TRUE METRIX BLOOD GLUCOSE TEST test strip USE AS DIRECTED TO TEST BLOOD SUGAR THREE TIMES DAILY 06/09/16   Copland, Gay Filler, MD    Family History Family History  Problem Relation Age of Onset  . Cancer Mother   . Pneumonia Mother   . Thyroid disease Mother   . Thyroid disease Sister   . Obesity Daughter   . Hypertension Daughter   . Thyroid disease Son   . Pneumonia Maternal Grandmother   . Heart attack Maternal Grandfather   . Diabetes Neg Hx     Social History Social History  Substance Use Topics  . Smoking status: Former Smoker    Years: 30.00    Types: Cigarettes    Quit date: 06/07/1999  . Smokeless tobacco: Never Used  . Alcohol use Not on file     Allergies   Aspirin; Crestor [rosuvastatin calcium]; Ibuprofen; Lipitor [atorvastatin calcium]; Pravastatin; Simvastatin; Trulicity [dulaglutide]; and Tylenol [acetaminophen]   Review of Systems Review of Systems  Constitutional: Positive for chills, fatigue and fever. Negative for appetite change and diaphoresis.  HENT: Negative for congestion and rhinorrhea.   Eyes: Negative for visual disturbance.  Respiratory: Negative for cough, chest tightness, shortness of breath, wheezing and stridor.   Cardiovascular: Negative for chest pain and leg swelling.  Gastrointestinal: Positive for abdominal pain. Negative for abdominal distention, blood in stool, constipation, diarrhea, nausea and vomiting.  Genitourinary: Positive for flank pain and frequency. Negative for decreased urine volume, dysuria, vaginal  bleeding and vaginal discharge.  Musculoskeletal: Negative for back pain, neck pain and neck stiffness.  Skin: Negative for rash and wound.  Neurological: Negative for headaches.  All other systems reviewed and are negative.    Physical Exam Updated Vital Signs BP (!) 118/53   Pulse 93   Temp (!) 102.7 F (39.3 C)   Resp (!) 32   Ht 5' 4"  (1.626 m)   Wt 79.4 kg (175 lb)   SpO2 (!) 86%   BMI 30.04 kg/m   Physical Exam  Constitutional: She is oriented to person, place, and time. She appears well-developed and well-nourished. No distress.  HENT:  Head: Normocephalic and atraumatic.  Mouth/Throat: Oropharynx  is clear and moist. No oropharyngeal exudate.  Eyes: Conjunctivae and EOM are normal. Pupils are equal, round, and reactive to light.  Neck: Neck supple.  Cardiovascular: Normal rate, regular rhythm and intact distal pulses.   No murmur heard. Pulmonary/Chest: Effort normal and breath sounds normal. No stridor. No respiratory distress. She has no wheezes. She exhibits no tenderness.  Abdominal: Soft. Normal appearance and bowel sounds are normal. There is tenderness. There is no rigidity, no rebound, no guarding and no CVA tenderness.    Musculoskeletal: She exhibits tenderness. She exhibits no edema.  Neurological: She is alert and oriented to person, place, and time. No cranial nerve deficit or sensory deficit. She exhibits normal muscle tone.  Skin: Skin is warm and dry. Capillary refill takes less than 2 seconds. No rash noted. She is not diaphoretic.  Psychiatric: She has a normal mood and affect.  Nursing note and vitals reviewed.    ED Treatments / Results  Labs (all labs ordered are listed, but only abnormal results are displayed) Labs Reviewed  COMPREHENSIVE METABOLIC PANEL - Abnormal; Notable for the following:       Result Value   Chloride 99 (*)    Glucose, Bld 151 (*)    Total Protein 8.3 (*)    AST 121 (*)    ALT 77 (*)    GFR calc non Af Amer 58 (*)     All other components within normal limits  CBC WITH DIFFERENTIAL/PLATELET - Abnormal; Notable for the following:    RDW 15.6 (*)    All other components within normal limits  URINALYSIS, ROUTINE W REFLEX MICROSCOPIC - Abnormal; Notable for the following:    APPearance TURBID (*)    Hgb urine dipstick TRACE (*)    Nitrite POSITIVE (*)    Leukocytes, UA LARGE (*)    All other components within normal limits  URINALYSIS, MICROSCOPIC (REFLEX) - Abnormal; Notable for the following:    Bacteria, UA MANY (*)    Squamous Epithelial / LPF 6-30 (*)    All other components within normal limits  CULTURE, BLOOD (ROUTINE X 2)  CULTURE, BLOOD (ROUTINE X 2)  URINE CULTURE  PROTIME-INR  CK  I-STAT CG4 LACTIC ACID, ED  I-STAT CG4 LACTIC ACID, ED    EKG  EKG Interpretation  Date/Time:  Monday May 28 2017 11:13:11 EDT Ventricular Rate:  89 PR Interval:    QRS Duration: 103 QT Interval:  349 QTC Calculation: 425 R Axis:   87 Text Interpretation:  Sinus rhythm Borderline right axis deviation Baseline wander in lead(s) V2 When compared to prior, no significant changes seen.  No STEMI Confirmed by Antony Blackbird [phone]) on 05/28/2017 7:46:43 PM       Radiology Dg Chest 2 View  Result Date: 05/28/2017 CLINICAL DATA:  Fever and headache. EXAM: CHEST  2 VIEW COMPARISON:  Chest CT 02/23/2012.  Chest x-ray 02/22/2012. FINDINGS: AP and lateral views of the chest show hyperexpansion. No focal airspace consolidation. No pulmonary edema or pleural effusion. Cardiopericardial silhouette is at upper limits of normal for size. The visualized bony structures of the thorax are intact. Telemetry leads overlie the chest. IMPRESSION: No active cardiopulmonary disease. Electronically Signed   By: Misty Stanley M.D.   On: 05/28/2017 11:32   Ct Abdomen Pelvis W Contrast  Result Date: 05/28/2017 CLINICAL DATA:  Bilateral flank pain EXAM: CT ABDOMEN AND PELVIS WITH CONTRAST TECHNIQUE: Multidetector CT imaging of  the abdomen and pelvis was performed using the standard protocol following  bolus administration of intravenous contrast. CONTRAST:  66m ISOVUE-300 IOPAMIDOL (ISOVUE-300) INJECTION 61% COMPARISON:  03/14/2017 FINDINGS: Lower chest: Lung bases are free of acute infiltrate or sizable effusion. Hepatobiliary: Diffuse fatty infiltration of the liver is noted. The gallbladder is within normal limits. Pancreas: Unremarkable. No pancreatic ductal dilatation or surrounding inflammatory changes. Spleen: Normal in size without focal abnormality. Adrenals/Urinary Tract: Adrenal glands are unremarkable. Kidneys are normal, without renal calculi, focal lesion, or hydronephrosis. Bladder is unremarkable. Stomach/Bowel: Mild diverticular change of the colon is noted. No diverticulitis is seen. The appendix is within normal limits. No inflammatory changes are noted. Vascular/Lymphatic: Changes of prior aortic stent graft are noted. There are no findings to suggest endoleak. No significant lymphadenopathy is noted. Reproductive: Status post hysterectomy. No adnexal masses. Other: No abdominal wall hernia or abnormality. No abdominopelvic ascites. Musculoskeletal: No acute or significant osseous findings. IMPRESSION: Postsurgical changes.  No acute abnormality noted. Electronically Signed   By: MInez CatalinaM.D.   On: 05/28/2017 14:07    Procedures Procedures (including critical care time)  CRITICAL CARE Performed by: CGwenyth AllegraTegeler Total critical care time: 35 minutes Critical care time was exclusive of separately billable procedures and treating other patients. Critical care was necessary to treat or prevent imminent or life-threatening deterioration. Critical care was time spent personally by me on the following activities: development of treatment plan with patient and/or surrogate as well as nursing, discussions with consultants, evaluation of patient's response to treatment, examination of patient, obtaining  history from patient or surrogate, ordering and performing treatments and interventions, ordering and review of laboratory studies, ordering and review of radiographic studies, pulse oximetry and re-evaluation of patient's condition.   Medications Ordered in ED Medications  sodium chloride 0.9 % bolus 1,000 mL (0 mLs Intravenous Stopped 05/28/17 1255)  sodium chloride 0.9 % bolus 1,000 mL (0 mLs Intravenous Stopped 05/28/17 1338)  iopamidol (ISOVUE-300) 61 % injection 100 mL (80 mLs Intravenous Contrast Given 05/28/17 1340)  cefTRIAXone (ROCEPHIN) 1 g in dextrose 5 % 50 mL IVPB (0 g Intravenous Stopped 05/28/17 1522)  sodium chloride 0.9 % bolus 500 mL (0 mLs Intravenous Stopped 05/28/17 1523)     Initial Impression / Assessment and Plan / ED Course  I have reviewed the triage vital signs and the nursing notes.  Pertinent labs & imaging results that were available during my care of the patient were reviewed by me and considered in my medical decision making (see chart for details).     PCASSIDY TASHIROis a 71y.o. female With a past medical history significant for AAA status post repair, diabetes, CAD, hypertension, hyperlipidemia, breast cancer, and diverticulitis who presents with bilateral flank pain, fevers, chills, foul-smelling urine, and right hip pain. Patient is accompanied by husband to says that for the last several days, patient has had worsening symptoms. She reports that she has had no dramatic injuries but it slowly began to feel worse. She describes that her urine has looked dark and very well smelling. She has a long history of UTIs. She says her pain radiates from her groin into her flanks. They do not go all the way to the back. Patient also reports the pain from the right flank radiates into her right hip. She denies any lower extremity symptoms. She denies any nausea, vomiting, constipation, diarrhea. She denies any runny nose, congestion, or cough. She denies any chest pain or  shortness of breath.  History and exam are seen above. On arrival, patient was hypoxic  in the 80s. She was placed on 2 L nasal cannula which improved her oxygenation. Patient's lungs were clear with no crackles or rhonchi. No wheezing. Abdomen was nontender but her flanks were slightly tender. No CVA tenderness. No significant the edema.  Patient's vitals also revealed tachycardia and fever. Patient worked up for possible sepsis.  Initial lactic acid was nonelevated. No leukocytosis. Urinalysis shows evidence of UTI with nitrites and leukocytes present. CMP shows elevated AST and ALT.  Given her history of AAA and diverticulitis with her symptoms, CT scan order to rule out diverticulitis or abscess. CT scan was reassuring. Patient given antibiotics for UTI as well as fluids and cultures. Chest x-ray shows no pneumonia.  Given vital signs arrival and new oxygen requirement, patient admitted for further management of her likely urosepsis. Lactic acid remained nonelevated.  Patient admitted in stable condition for further management.    Final Clinical Impressions(s) / ED Diagnoses   Final diagnoses:  Sepsis secondary to UTI [facility])    Clinical Impression: 1. Sepsis secondary to UTI [facility])     Disposition: Admit to Hospitalist service    Tegeler, Gwenyth Allegra, MD 05/28/17 [phone]

## 2017-05-28 NOTE — ED Notes (Signed)
Ice packs applied to axillary and groin areas.

## 2017-05-28 NOTE — H&P (Signed)
Mandy Kim ZOX:[id number] DOB: Sep 20, 1946 DOA: 05/28/2017     PCP: Darreld Mclean, MD   Outpatient Specialists: Endocrinology Loanne Drilling, Urology  [place] Patient coming from:  home Lives  With family    Chief Complaint: Left flank pain dysuria and urgency  HPI: Mandy Kim is a 71 y.o. female with medical history significant of CKD and diabetes mellitus, HLD, patchy liver disease with elevated LFTs diabetic neuropathy, recurrent UTIs, breast cancer, sleep apnea on CPAP    Presented with joint pain generalized weakness and dysuria for the past 3 days. She's been having a headache for the past 3 days as well as some fevers. Urine has been getting darker and she overall feels unwell.  Initially when patient arrived to emergency department she was hypoxic down to 86% required 2 L of oxygen febrile up to 102.7 respirations 32 initially but patient does not tolerate Tylenol or ibuprofen ice packs applied in ER.  Patient have had poor appetite. Denies sick contacts. Denies nausea no vomiting no chest pain. Family states even last week she was not feeling well she had a sore throat, occasional cough she went to urgent care and was given amoxicillin.This did not seem to help. She reports a headache for 1 week. Associated with some photophobia.  She is out side a lot but have not seen any tick bites had RMSF diagnosis last year.       Regarding pertinent Chronic problems: Diabetes mellitus poorly controlled currently on insulin and has been cystoscopy thousand 16 She has known elevated LFTs and have completed a workup has negative hepatitis panel. Liver echogenicity showing hepatic steatosis. Has hx of OSA on CPAP.  IN ER:  Temp (24hrs), Avg:101.2 F (38.4 C), Min:98.3 F (36.8 C), Max:102.9 F (39.4 C)      on arrival  ED Triage Vitals  Enc Vitals Group     BP 05/28/17 1059 (!) 118/53     Pulse Rate 05/28/17 1059 93     Resp 05/28/17 1059 (!) 32     Temp 05/28/17 1059 (!)  102.7 F (39.3 C)     Temp Source 05/28/17 1436 Oral     SpO2 05/28/17 1059 (!) 86 %     Weight 05/28/17 1100 175 lb (79.4 kg)     Height 05/28/17 1100 5' 4"  (1.626 m)     Head Circumference --      Peak Flow --      Pain Score 05/28/17 1101 10     Pain Loc --      Pain Edu? --      Excl. in [place]? --    RR 30 satting 94% on 2 L BP 135 / 52   Lactic acid 1.5 Sodium 1:30 6K4.0 CR 0.96 WBC 4.9 hemoglobin 14.7 CT abdomen and pelvis non-acute Chest x-ray unremarkable Following Medications were ordered in ER: Medications  sodium chloride 0.9 % bolus 1,000 mL (0 mLs Intravenous Stopped 05/28/17 1255)  sodium chloride 0.9 % bolus 1,000 mL (0 mLs Intravenous Stopped 05/28/17 1338)  iopamidol (ISOVUE-300) 61 % injection 100 mL (80 mLs Intravenous Contrast Given 05/28/17 1340)  cefTRIAXone (ROCEPHIN) 1 g in dextrose 5 % 50 mL IVPB (0 g Intravenous Stopped 05/28/17 1522)  sodium chloride 0.9 % bolus 500 mL (0 mLs Intravenous Stopped 05/28/17 1523)       Hospitalist was called for admission for Sepsis secondary to UTI  Review of Systems:    Pertinent positives include: Fevers, chills, fatigue, dysuria,  change in color of urine,  Constitutional:  No weight loss, night sweats, weight loss  HEENT:  No headaches, Difficulty swallowing,Tooth/dental problems,Sore throat,  No sneezing, itching, ear ache, nasal congestion, post nasal drip,  Cardio-vascular:  No chest pain, Orthopnea, PND, anasarca, dizziness, palpitations.no Bilateral lower extremity swelling  GI:  No heartburn, indigestion, abdominal pain, nausea, vomiting, diarrhea, change in bowel habits, loss of appetite, melena, blood in stool, hematemesis Resp:  no shortness of breath at rest. No dyspnea on exertion, No excess mucus, no productive cough, No non-productive cough, No coughing up of blood.No change in color of mucus.No wheezing. Skin:  no rash or lesions. No jaundice GU:  no  no urgency or frequency. No straining to urinate.    No flank pain.  Musculoskeletal:  No joint pain or no joint swelling. No decreased range of motion. No back pain.  Psych:  No change in mood or affect. No depression or anxiety. No memory loss.  Neuro: no localizing neurological complaints, no tingling, no weakness, no double vision, no gait abnormality, no slurred speech, no confusion  As per HPI otherwise 10 point review of systems negative.   Past Medical History: Past Medical History:  Diagnosis Date  . Allergy   . Cancer ([place])   . Cataract   . Chronic kidney disease   . Diabetes mellitus   . Diverticulitis   . GERD (gastroesophageal reflux disease)   . Hyperlipidemia   . Hypertension   . Sleep apnea    has c pap machine   Past Surgical History:  Procedure Laterality Date  . ABDOMINAL AORTIC ANEURYSM REPAIR    . ABDOMINAL AORTIC ANEURYSM REPAIR    . ABDOMINAL HYSTERECTOMY  2000  . Bladder tacking    . BREAST SURGERY    . CATARACT EXTRACTION, BILATERAL    . EYE SURGERY    . MASTECTOMY    . TONSILLECTOMY       Social History:  Ambulatory  independently      reports that she quit smoking about 17 years ago. Her smoking use included Cigarettes. She quit after 30.00 years of use. She has never used smokeless tobacco. Her alcohol and drug histories are not on file.  Allergies:   Allergies  Allergen Reactions  . Aspirin     Per pt any pain medications  . Crestor [Rosuvastatin Calcium]   . Ibuprofen Swelling  . Lipitor [Atorvastatin Calcium]   . Pravastatin   . Simvastatin   . Trulicity [Dulaglutide] Other (See Comments)    flatulance  . Tylenol [Acetaminophen] Swelling       Family History:  Family History  Problem Relation Age of Onset  . Cancer Mother   . Pneumonia Mother   . Thyroid disease Mother   . Thyroid disease Sister   . Obesity Daughter   . Hypertension Daughter   . Thyroid disease Son   . Pneumonia Maternal Grandmother   . Heart attack Maternal Grandfather   . Diabetes Neg Hx      Medications: Prior to Admission medications   Medication Sig Start Date End Date Taking? Authorizing Provider  b complex vitamins tablet Take 1 tablet by mouth daily.   Yes [provider]  Biotin 1000 MCG tablet Take 1,000 mcg by mouth daily.   Yes [provider]  cholecalciferol (VITAMIN D) 1000 UNITS tablet Take 1,000 Units by mouth daily.   Yes [provider]  clopidogrel (PLAVIX) 75 MG tablet TAKE 1 TABLET (75 MG TOTAL) BY MOUTH  DAILY. 04/05/17  Yes Copland, Gay Filler, MD  gemfibrozil (LOPID) 600 MG tablet TAKE 1 TABLET (600 MG TOTAL) BY MOUTH 2 (TWO) TIMES DAILY BEFORE A MEAL. 04/25/17  Yes Copland, Gay Filler, MD  insulin glargine (LANTUS) 100 UNIT/ML injection Inject 0.8 mLs (80 Units total) into the skin at bedtime. Patient taking differently: Inject 80 Units into the skin every morning.  05/22/17  Yes Renato Shin, MD  KLOR-CON M20 20 MEQ tablet TAKE 1 TABLET (20 MEQ TOTAL) BY MOUTH DAILY. 04/24/17  Yes Copland, Gay Filler, MD  levocetirizine (XYZAL) 5 MG tablet Take 1 tablet (5 mg total) by mouth every evening. Patient taking differently: Take 5 mg by mouth daily.  05/17/17  Yes Roma Schanz R, DO  metFORMIN (GLUCOPHAGE) 1000 MG tablet TAKE 1 TABLET (500 MG TOTAL) BY MOUTH 2 (TWO) TIMES DAILY WITH A MEAL. 03/05/17  Yes Copland, Gay Filler, MD  Multiple Vitamin (MULTIVITAMIN) tablet Take 0.5 tablets by mouth 2 (two) times daily.    Yes [provider]  pantoprazole (PROTONIX) 40 MG tablet TAKE 1 TABLET BY MOUTH EVERY DAY ON AN EMPTY STOMACH 05/15/16  Yes Copland, Gay Filler, MD  vitamin C (ASCORBIC ACID) 500 MG tablet Take 500 mg by mouth daily.   Yes [provider]  Alcohol Swabs PADS Use to test blood sugar 3 times daily. Dx: E11.9 12/20/15   Copland, Gay Filler, MD  amoxicillin (AMOXIL) 875 MG tablet Take 1 tablet (875 mg total) by mouth 2 (two) times daily. Patient not taking: Reported on 05/28/2017 05/17/17   Carollee Herter, Alferd Apa, DO   Blood Glucose Calibration (GLUCOSE CONTROL) SOLN Use to test blood sugar 3 times daily. Dx: E11.9 12/20/15   Copland, Gay Filler, MD  Blood Glucose Monitoring Suppl (TRUE METRIX AIR GLUCOSE METER) w/Device KIT USE  TO TEST BLOOD SUGAR THREE TIMES DAILY 02/15/16   Copland, Gay Filler, MD  gabapentin (NEURONTIN) 100 MG capsule Take 1 capsule (100 mg total) by mouth at bedtime. Can increase to 200 or 300 mg as needed for foot pain 04/11/17   Copland, Gay Filler, MD  Insulin Syringe-Needle U-100 (INSULIN SYRINGE 1CC/30GX5/16") 30G X 5/16" 1 ML MISC Use to inject insulin 1 time per day. 05/22/17   Renato Shin, MD  niacin (NIASPAN) 500 MG CR tablet Take 1 tablet (500 mg total) by mouth at bedtime. Patient not taking: Reported on 05/28/2017 08/25/15   Copland, Gay Filler, MD  TRUE METRIX BLOOD GLUCOSE TEST test strip USE AS DIRECTED TO TEST BLOOD SUGAR THREE TIMES DAILY 06/09/16   Copland, Gay Filler, MD    Physical Exam: Patient Vitals for the past 24 hrs:  BP Temp Temp src Pulse Resp SpO2 Height Weight  05/28/17 1900 (!) 156/65 (!) 100.7 F (38.2 C) - 81 (!) 21 94 % - -  05/28/17 1704 (!) 135/52 (!) 102.9 F (39.4 C) Oral 82 (!) 30 94 % - -  05/28/17 1630 (!) 130/59 - - 82 (!) 25 94 % - -  05/28/17 1600 (!) 112/46 - - 77 (!) 25 95 % - -  05/28/17 1530 (!) 125/44 - - 76 (!) 24 97 % - -  05/28/17 1500 (!) 126/54 - - 75 (!) 26 95 % - -  05/28/17 1436 - 98.3 F (36.8 C) Oral - - - - -  05/28/17 1435 (!) 120/54 - - 77 (!) 26 91 % - -  05/28/17 1337 (!) 127/55 - - 75 18 90 % - -  05/28/17 1300 (!) 113/55 - - 73 20 96 % - -  05/28/17 1241 - - - - - 98 % - -  05/28/17 1230 122/62 - - 74 (!) 25 98 % - -  05/28/17 1200 (!) 118/59 - - 81 (!) 25 96 % - -  05/28/17 1153 (!) 124/56 - - 82 (!) 27 96 % - -  05/28/17 1100 - - - - - - 5' 4"  (1.626 m) 79.4 kg (175 lb)  05/28/17 1059 (!) 118/53 (!) 102.7 F (39.3 C) - 93 (!) 32 (!) 86 % - -    1. General:  in No Acute distress 2. Psychological: Alert and   Oriented 3. Head/ENT:    Dry Mucous Membranes                          Head Non traumatic, neck supple                           Poor Dentition 4. SKIN:  decreased Skin turgor,  Skin clean Dry and intact no rash 5. Heart: Regular rate and rhythm no  Murmur, Rub or gallop 6. Lungs:   no wheezes or crackles   7. Abdomen: Soft,supra pubic tenderness , Non distended 8. Lower extremities: no clubbing, cyanosis, or edema 9. Neurologically Grossly intact, moving all 4 extremities equally   10. MSK: Normal range of motion   body mass index is 30.04 kg/m.  Labs on Admission:   Labs on Admission: I have personally reviewed following labs and imaging studies  CBC:  Recent Labs Lab 05/28/17 1115  WBC 4.9  NEUTROABS 3.4  HGB 14.7  HCT 42.6  MCV 85.4  PLT 435   Basic Metabolic Panel:  Recent Labs Lab 05/28/17 1115  NA 136  K 4.0  CL 99*  CO2 25  GLUCOSE 151*  BUN 16  CREATININE 0.96  CALCIUM 9.7   GFR: Estimated Creatinine Clearance: 54 mL/min (by C-G formula based on SCr of 0.96 mg/dL). Liver Function Tests:  Recent Labs Lab 05/28/17 1115  AST 121*  ALT 77*  ALKPHOS 40  BILITOT 0.7  PROT 8.3*  ALBUMIN 4.2   No results for input(s): LIPASE, AMYLASE in the last 168 hours. No results for input(s): AMMONIA in the last 168 hours. Coagulation Profile:  Recent Labs Lab 05/28/17 1115  INR 1.09   Cardiac Enzymes: No results for input(s): CKTOTAL, CKMB, CKMBINDEX, TROPONINI in the last 168 hours. BNP (last 3 results) No results for input(s): PROBNP in the last 8760 hours. HbA1C: No results for input(s): HGBA1C in the last 72 hours. CBG: No results for input(s): GLUCAP in the last 168 hours. Lipid Profile: No results for input(s): CHOL, HDL, LDLCALC, TRIG, CHOLHDL, LDLDIRECT in the last 72 hours. Thyroid Function Tests: No results for input(s): TSH, T4TOTAL, FREET4, T3FREE, THYROIDAB in the last 72 hours. Anemia Panel: No results for input(s): VITAMINB12,  FOLATE, FERRITIN, TIBC, IRON, RETICCTPCT in the last 72 hours. Urine analysis:    Component Value Date/Time   COLORURINE YELLOW 05/28/2017 1325   APPEARANCEUR TURBID (A) 05/28/2017 1325   LABSPEC 1.017 05/28/2017 1325   PHURINE 5.5 05/28/2017 1325   GLUCOSEU NEGATIVE 05/28/2017 1325   HGBUR TRACE (A) 05/28/2017 1325   BILIRUBINUR NEGATIVE 05/28/2017 1325   BILIRUBINUR neg 05/23/2014 1352   KETONESUR NEGATIVE 05/28/2017 1325   PROTEINUR NEGATIVE 05/28/2017 1325   UROBILINOGEN 0.2 05/23/2014 1352  NITRITE POSITIVE (A) 05/28/2017 1325   LEUKOCYTESUR LARGE (A) 05/28/2017 1325   Sepsis Labs: @LABRCNTIP (procalcitonin:4,lacticidven:4) )No results found for this or any previous visit (from the past 240 hour(s)).   UA evidence of UTI  Lab Results  Component Value Date   HGBA1C 10.9 (H) 03/08/2017    Estimated Creatinine Clearance: 54 mL/min (by C-G formula based on SCr of 0.96 mg/dL).  BNP (last 3 results) No results for input(s): PROBNP in the last 8760 hours.   ECG REPORT  Independently reviewed Rate:89  Rhythm: Sinus rhythm ST&T Change: No acute ischemic changes   QTC 425  Filed Weights   05/28/17 1100  Weight: 79.4 kg (175 lb)     Cultures:    Component Value Date/Time   SDES URINE, RANDOM 02/24/2012 0433   SPECREQUEST NONE 02/24/2012 0433   CULT NO GROWTH 5 DAYS 02/22/2012 1737   CULT NO GROWTH 5 DAYS 02/22/2012 1737   REPTSTATUS 02/25/2012 FINAL 02/24/2012 0433     Radiological Exams on Admission: Dg Chest 2 View  Result Date: 05/28/2017 CLINICAL DATA:  Fever and headache. EXAM: CHEST  2 VIEW COMPARISON:  Chest CT 02/23/2012.  Chest x-ray 02/22/2012. FINDINGS: AP and lateral views of the chest show hyperexpansion. No focal airspace consolidation. No pulmonary edema or pleural effusion. Cardiopericardial silhouette is at upper limits of normal for size. The visualized bony structures of the thorax are intact. Telemetry leads overlie the chest. IMPRESSION: No  active cardiopulmonary disease. Electronically Signed   By: Misty Stanley M.D.   On: 05/28/2017 11:32   Ct Abdomen Pelvis W Contrast  Result Date: 05/28/2017 CLINICAL DATA:  Bilateral flank pain EXAM: CT ABDOMEN AND PELVIS WITH CONTRAST TECHNIQUE: Multidetector CT imaging of the abdomen and pelvis was performed using the standard protocol following bolus administration of intravenous contrast. CONTRAST:  63m ISOVUE-300 IOPAMIDOL (ISOVUE-300) INJECTION 61% COMPARISON:  03/14/2017 FINDINGS: Lower chest: Lung bases are free of acute infiltrate or sizable effusion. Hepatobiliary: Diffuse fatty infiltration of the liver is noted. The gallbladder is within normal limits. Pancreas: Unremarkable. No pancreatic ductal dilatation or surrounding inflammatory changes. Spleen: Normal in size without focal abnormality. Adrenals/Urinary Tract: Adrenal glands are unremarkable. Kidneys are normal, without renal calculi, focal lesion, or hydronephrosis. Bladder is unremarkable. Stomach/Bowel: Mild diverticular change of the colon is noted. No diverticulitis is seen. The appendix is within normal limits. No inflammatory changes are noted. Vascular/Lymphatic: Changes of prior aortic stent graft are noted. There are no findings to suggest endoleak. No significant lymphadenopathy is noted. Reproductive: Status post hysterectomy. No adnexal masses. Other: No abdominal wall hernia or abnormality. No abdominopelvic ascites. Musculoskeletal: No acute or significant osseous findings. IMPRESSION: Postsurgical changes.  No acute abnormality noted. Electronically Signed   By: MInez CatalinaM.D.   On: 05/28/2017 14:07    Chart has been reviewed    Assessment/Plan  71y.o. female with medical history significant of CKD and diabetes mellitus, HLD, patchy liver disease with elevated LFTs diabetic neuropathy, recurrent UTIs, breast cancer, sleep apnea  Admitted for sepsis secondary to UTI  Present on Admission: . Sepsis ([place] likely  source being urinary tract infection. Neck is supple no confusion or encephalopathy to suggest CNS source. Patient has exposure to the tick born illness in the past and spends a lot of time outdoors we'll check RMSF serologies and add doxycycline. Given upper respiratory symptoms initially check respiratory panel. Rehydrate and check x-ray in a.m. .Marland KitchenHyperlipidemia stable continue home medications . Elevated LFTs - this is chronic  patient has history of fatty liver infiltration continues to follow LFTs . Acute lower UTI  - treat with Rocephin await results of urine culture  Dehydration will rehydrate and follow fluid status  Diabetes mellitus type 2 check hemoglobin A1c TSH order sliding scale, continue Lantus but at the lower  Sleep apnea will order CPAP  Headache - frontal,  neck is supple denies any head injury. Given febrile illness associated with headache and history of tick exposure in the past will order RMSF serologies and initiate doxycycline. Patient this point is neurologically intact.  Other plan as per orders.  DVT prophylaxis:    Lovenox     Code Status:  FULL CODE as per patient    Family Communication:   Family  at  Bedside  plan of care was discussed with  Daughter,  Disposition Plan:        To home once workup is complete and patient is stable                      Would benefit from PT/OT eval prior to DC   ordered                                                 Consults called: none  Admission status:  inpatient       Level of care     tele            I have spent a total of 56 min on this admission    Shaneece Stockburger 05/28/2017, 9:26 PM    [facility]  Pager [phone]   after 2 AM please page floor coverage PA If 7AM-7PM, please contact the day team taking care of the patient  [email or web address]  Password [email or web address]

## 2017-05-28 NOTE — Progress Notes (Signed)
Patient arrived to 1516 at 1830. Patient alert and oriented x4. She ambulated 1 assist to [facility]. Patient temp was 100.7 orally. Patient had ice packs from [place]. She can not take tylenol or ibuprofen due to allergy. Md paged awaiting admission orders

## 2017-05-28 NOTE — ED Notes (Signed)
O2 removed for observation purposes. O2 sat remains decreased at 90-91%; O2 replaced at 2L and sats increased to 97%.

## 2017-05-28 NOTE — ED Triage Notes (Signed)
Pt having left flank pain with urgency, fever and headache since Friday.  Pt has dark urine according to family.  Pt states she doesn't feel well.

## 2017-05-28 NOTE — Significant Event (Signed)
  PENDING ACCEPTANCE TRANFER NOTE: [place]  Call received from:    [place] REQUESTING TRANSFER:    Sepsis/UTI  CC: Flank pain  HPI:  71 year old female with medical history of CKD and diabetes came in with fever of 102.7, RR of 32 and hypoxic at 86 initially, urinalysis consistent with UTI. She has sepsis secondary to UTI.   PLAN:  According to telephone report, this patient was accepted for transfer to [facility], under [facility] team:  [facility],  I have requested an order be written to call Flow Manager at [phone] upon patient arrival to the floor for final physician assignment who will do the admission and give admitting orders.  SIGNED: Birdie Hopes, MD [facility]  05/28/2017, 3:27 PM

## 2017-05-28 NOTE — ED Notes (Signed)
Report to Danielle, RN at [facility].  

## 2017-05-29 ENCOUNTER — Inpatient Hospital Stay (HOSPITAL_COMMUNITY): Payer: Medicare HMO

## 2017-05-29 DIAGNOSIS — B348 Other viral infections of unspecified site: Secondary | ICD-10-CM

## 2017-05-29 LAB — COMPREHENSIVE METABOLIC PANEL
ALT: 82 U/L — AB (ref 14–54)
AST: 142 U/L — ABNORMAL HIGH (ref 15–41)
Albumin: 3.4 g/dL — ABNORMAL LOW (ref 3.5–5.0)
Alkaline Phosphatase: 31 U/L — ABNORMAL LOW (ref 38–126)
Anion gap: 11 (ref 5–15)
BUN: 13 mg/dL (ref 6–20)
CHLORIDE: 102 mmol/L (ref 101–111)
CO2: 22 mmol/L (ref 22–32)
CREATININE: 0.74 mg/dL (ref 0.44–1.00)
Calcium: 8.2 mg/dL — ABNORMAL LOW (ref 8.9–10.3)
Glucose, Bld: 132 mg/dL — ABNORMAL HIGH (ref 65–99)
POTASSIUM: 3.5 mmol/L (ref 3.5–5.1)
SODIUM: 135 mmol/L (ref 135–145)
Total Bilirubin: 0.7 mg/dL (ref 0.3–1.2)
Total Protein: 6.7 g/dL (ref 6.5–8.1)

## 2017-05-29 LAB — GLUCOSE, CAPILLARY
GLUCOSE-CAPILLARY: 129 mg/dL — AB (ref 65–99)
GLUCOSE-CAPILLARY: 138 mg/dL — AB (ref 65–99)
Glucose-Capillary: 119 mg/dL — ABNORMAL HIGH (ref 65–99)
Glucose-Capillary: 135 mg/dL — ABNORMAL HIGH (ref 65–99)
Glucose-Capillary: 164 mg/dL — ABNORMAL HIGH (ref 65–99)
Glucose-Capillary: 182 mg/dL — ABNORMAL HIGH (ref 65–99)

## 2017-05-29 LAB — RESPIRATORY PANEL BY PCR
Adenovirus: NOT DETECTED
BORDETELLA PERTUSSIS-RVPCR: NOT DETECTED
CORONAVIRUS 229E-RVPPCR: NOT DETECTED
CORONAVIRUS OC43-RVPPCR: NOT DETECTED
Chlamydophila pneumoniae: NOT DETECTED
Coronavirus HKU1: NOT DETECTED
Coronavirus NL63: NOT DETECTED
INFLUENZA A-RVPPCR: NOT DETECTED
Influenza B: NOT DETECTED
Metapneumovirus: NOT DETECTED
Mycoplasma pneumoniae: NOT DETECTED
PARAINFLUENZA VIRUS 1-RVPPCR: NOT DETECTED
PARAINFLUENZA VIRUS 2-RVPPCR: NOT DETECTED
PARAINFLUENZA VIRUS 3-RVPPCR: NOT DETECTED
PARAINFLUENZA VIRUS 4-RVPPCR: NOT DETECTED
RESPIRATORY SYNCYTIAL VIRUS-RVPPCR: NOT DETECTED
Rhinovirus / Enterovirus: DETECTED — AB

## 2017-05-29 LAB — TSH: TSH: 3.973 u[IU]/mL (ref 0.350–4.500)

## 2017-05-29 LAB — CBC
HEMATOCRIT: 36.8 % (ref 36.0–46.0)
HEMOGLOBIN: 12.6 g/dL (ref 12.0–15.0)
MCH: 28.7 pg (ref 26.0–34.0)
MCHC: 34.2 g/dL (ref 30.0–36.0)
MCV: 83.8 fL (ref 78.0–100.0)
Platelets: 142 10*3/uL — ABNORMAL LOW (ref 150–400)
RBC: 4.39 MIL/uL (ref 3.87–5.11)
RDW: 15.2 % (ref 11.5–15.5)
WBC: 3.8 10*3/uL — AB (ref 4.0–10.5)

## 2017-05-29 LAB — PHOSPHORUS: PHOSPHORUS: 3.2 mg/dL (ref 2.5–4.6)

## 2017-05-29 LAB — MAGNESIUM: Magnesium: 1.8 mg/dL (ref 1.7–2.4)

## 2017-05-29 IMAGING — DX DG CHEST 2V
2 series · 2 of 2 positions shown · non-contrast
Comparison: [DATE]

CLINICAL DATA: Hypoxia

EXAM:
CHEST  2 VIEW

[w chest lat]
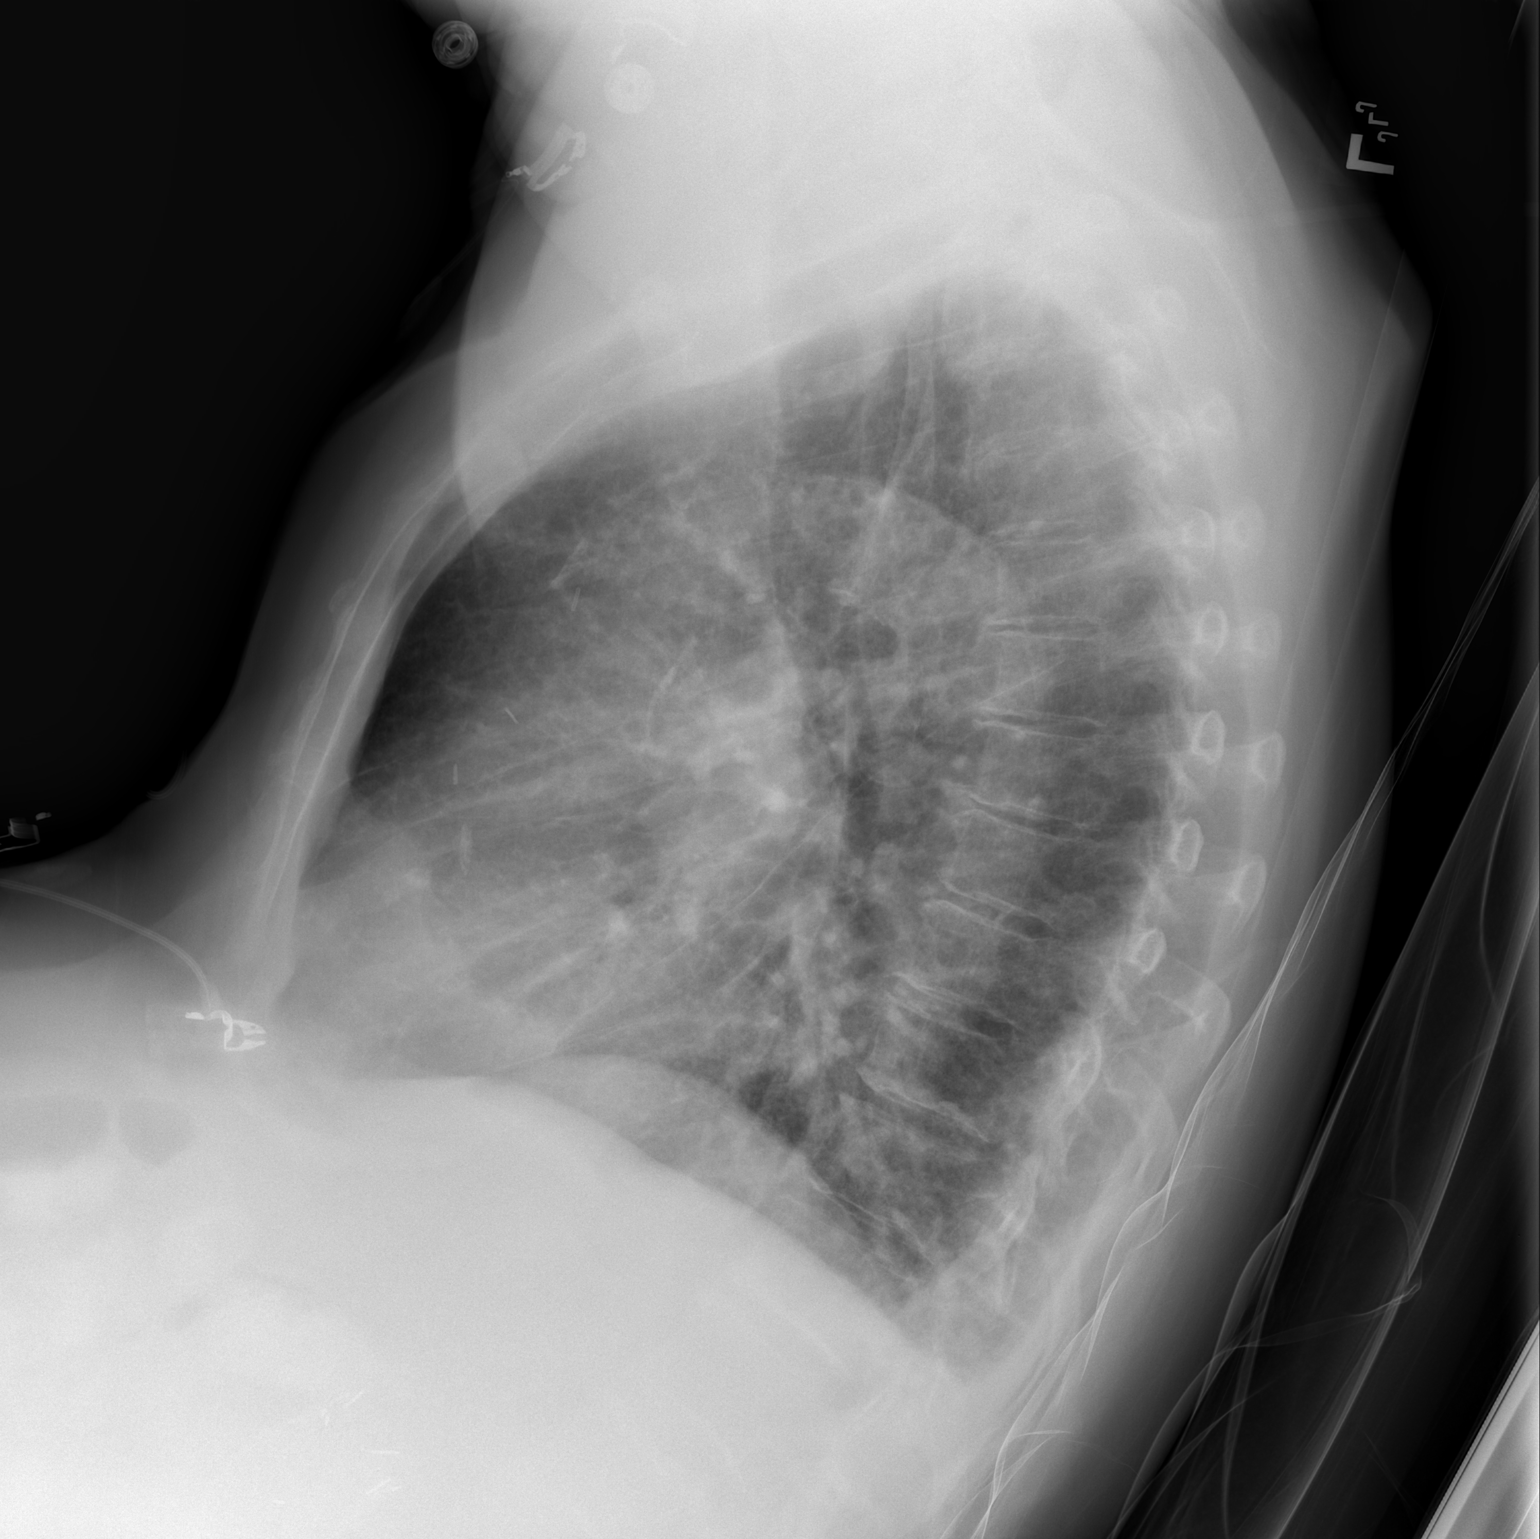

[chest ap]
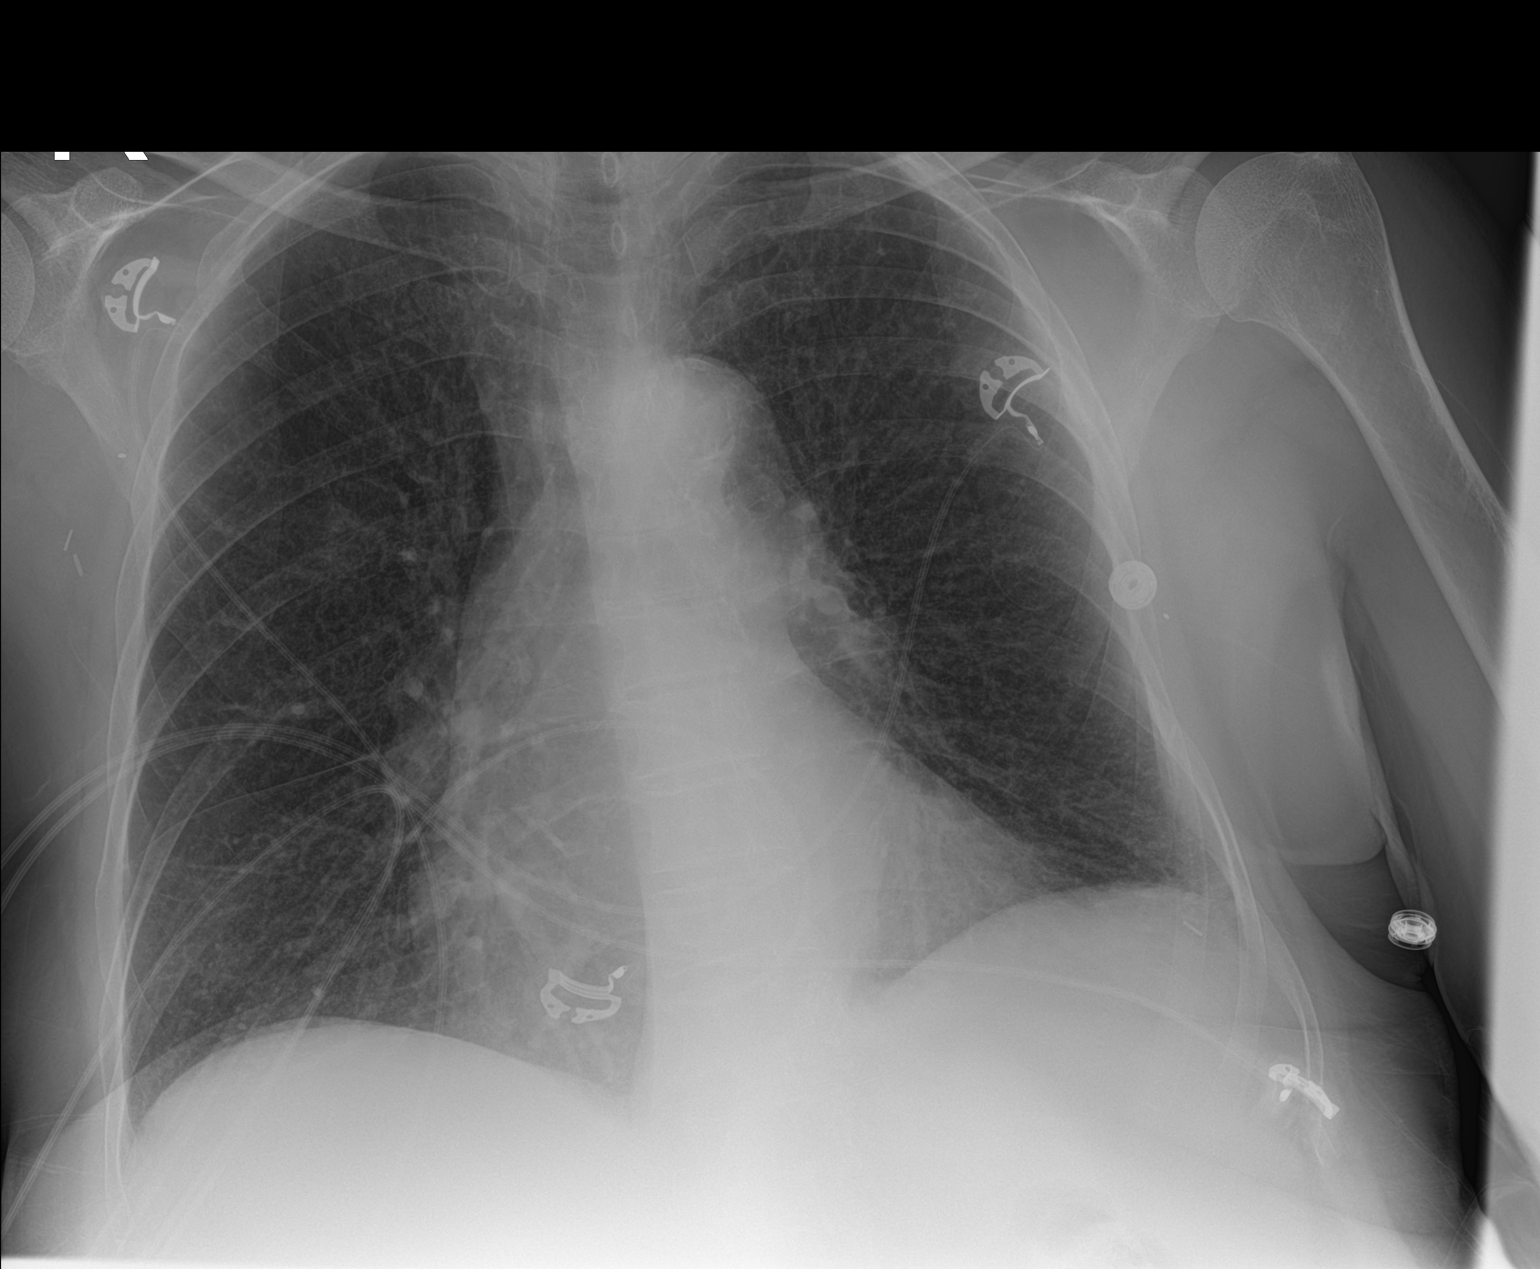

[2 of 2 positions shown; findings below may reference images not displayed]

FINDINGS: Cardiac shadow is stable. Aortic calcifications are again seen.
Lungs are clear bilaterally. Mild degenerative change of the
thoracic spine is noted.
IMPRESSION: No active cardiopulmonary disease.

## 2017-05-29 MED ORDER — ALUM & MAG HYDROXIDE-SIMETH 200-200-20 MG/5ML PO SUSP
30.0000 mL | Freq: Four times a day (QID) | ORAL | Status: DC | PRN
  Administered 2017-05-29: 30 mL via ORAL
  Filled 2017-05-29: qty 30

## 2017-05-29 MED ORDER — INSULIN GLARGINE 100 UNIT/ML ~~LOC~~ SOLN
20.0000 [IU] | Freq: Every day | SUBCUTANEOUS | Status: DC
  Administered 2017-05-30: 20 [IU] via SUBCUTANEOUS
  Filled 2017-05-29: qty 0.2

## 2017-05-29 MED ORDER — LACTATED RINGERS IV SOLN
INTRAVENOUS | Status: DC
  Administered 2017-05-29 – 2017-05-30 (×2): via INTRAVENOUS

## 2017-05-29 NOTE — Progress Notes (Signed)
PT Cancellation Note  Patient Details Name: Mandy Kim MRN: [id number] DOB: 25-Mar-1946   Cancelled Treatment:    Reason Eval/Treat Not Completed: Other (comment);Fatigue/lethargy limiting ability to participate; attempted to see pt x2 today; 1st attempt pt husband declined stating that the MD told her not to walk for 24 hrs; MD paged and stated that this was not the case and pt could amb, 2nd attempt pt sleeping with CPAP, room dark and not easily aroused; will continue efforts to complete PT eval   [facility] 05/29/2017, 1:42 PM

## 2017-05-29 NOTE — Progress Notes (Signed)
OT Cancellation Note  Patient Details Name: Mandy Kim MRN: [id number] DOB: 29-Nov-1946   Cancelled Treatment:    Reason Eval/Treat Not Completed: Fatigue/lethargy limiting ability to participate.  Pt sleeping; unable to keep her aroused. Will check back.  Jasma Seevers 05/29/2017, 2:02 PM  Lesle Chris, OTR/L [phone] 05/29/2017

## 2017-05-29 NOTE — Progress Notes (Signed)
PROGRESS NOTE    Mandy Kim  NTI:[id number] DOB: [birth year] DOA: 05/28/2017 PCP: Darreld Mclean, MD    Brief Narrative:  71 year old female presented with left flank pain dysuria and urinary urgency. She is known to have chronic kidney disease, type 2 diabetes mellitus, dyslipidemia, patchy liver disease, diabetic neuropathy, recurrent urinary tract infections, history of breast cancer. Worsening symptoms for last 3 days prior to hospitalization, associated with fevers, dark urine. On initial physical examination, her temperature was 102.7, respiratory rate of 32, oxygen saturation 86%, blood pressure 118/53, heart rate 93 beats per minutes. Her oral mucosa was dry, her lungs were clear to auscultation, heart S1-S2 present tachycardic, no gallops or murmurs, the abdomen was soft nontender, no lower extremity edema. Patient was admitted to the hospital working diagnosis of sepsis due to urinary tract infection, present on admission.   Assessment & Plan:   Active Problems:   Diabetes mellitus (HCC)   Hyperlipidemia   Sepsis ([place])   Elevated LFTs   Acute lower UTI   Headache   Dehydration   1. Sepsis due to urinary tract infection, present on admission. Will continue with antibiotic therapy, ceftriaxone IV follow on cultures, cell count and temperature curve.  Urine positive for greater than 100, 000 CFU of gram negative rods. Old records personally reviewed, urine culture from 06/20 with E. Coli sensitive cephalosporins.   2. Dehydration. Will continue IV fluids with LR at 100 cc per hour, renal function with preserved renal function per cr, K at 3,5 with Na at 135 and serum bicarbonate at 22. Will avoid hypotension and nephrotoxic medications.   3. Type 2 diabetes mellitus. Will continue glucose cover and monitoring with insulin sliding scale. Patient tolerating po well. Capillary glucose 130, 119, 129, 135. Will decrease dose of long acting insulin for now to prevent  hypoglycemia.   4. Elevated liver enzymes. AST at 142 and ALT at 82, with albumin at 3,4. INR 1.0. Will continue supportive medical care. Follow on LFt in am.     5. High d dimer. Low probability per Well's score, will check lower extremity dopplers and follow on V/Q scan. Chest film personally reviewed, no infiltrates.   6. Fever due to Positive rhinovirus. Will continue supportive medical care. Follow temperature curve. Patient with no recent tick exposure, will follow on RMSF serologies requested on admission. In the setting of positive viral illness will hold on doxy for now.   DVT prophylaxis:  Code Status: Full  Family Communication: I spoke with patient's husband at bedside and all questions were addressed.  Disposition Plan: Home   Consultants:     Procedures:   Antimicrobials:  Ceftriaxone  Subjective: Patient not feeling well, generalized malaise, moderate to severe, no dyspnea, nausea, vomiting or chest pain.    Objective: Vitals:   05/28/17 2346 05/29/17 0344 05/29/17 0347 05/29/17 0938  BP: (!) 115/40 (!) 124/46    Pulse: 81 79    Resp: (!) 25 (!) 24    Temp: (!) 102.6 F (39.2 C) 100.2 F (37.9 C)  (!) 101 F (38.3 C)  TempSrc: Oral Oral  Oral  SpO2: 92% (!) 86% 91%   Weight:      Height:        Intake/Output Summary (Last 24 hours) at 05/29/17 1032 Last data filed at 05/29/17 0945  Gross per 24 hour  Intake             3670 ml  Output  400 ml  Net             3270 ml   Filed Weights   05/28/17 1100  Weight: 79.4 kg (175 lb)    Examination:  General exam: deconditioned and ill looking appearing E ENT. Mild pallor, no icterus, oral mucosa dry.  Respiratory system: Clear to auscultation. Respiratory effort normal. No wheezing, rales or rhonchi.  Cardiovascular system: S1 & S2 heard, RRR. No JVD, murmurs, rubs, gallops or clicks. No pedal edema. Gastrointestinal system: Abdomen is nondistended, soft and nontender. No organomegaly or  masses felt. Normal bowel sounds heard. Central nervous system: Alert and oriented. No focal neurological deficits. Extremities: Symmetric 5 x 5 power. Skin: No rashes, lesions or ulcers     Data Reviewed: I have personally reviewed following labs and imaging studies  CBC:  Recent Labs Lab 05/28/17 1115 05/29/17 0806  WBC 4.9 3.8*  NEUTROABS 3.4  --   HGB 14.7 12.6  HCT 42.6 36.8  MCV 85.4 83.8  PLT 217 623*   Basic Metabolic Panel:  Recent Labs Lab 05/28/17 1115 05/29/17 0806  NA 136 135  K 4.0 3.5  CL 99* 102  CO2 25 22  GLUCOSE 151* 132*  BUN 16 13  CREATININE 0.96 0.74  CALCIUM 9.7 8.2*  MG  --  1.8  PHOS  --  3.2   GFR: Estimated Creatinine Clearance: 64.8 mL/min (by C-G formula based on SCr of 0.74 mg/dL). Liver Function Tests:  Recent Labs Lab 05/28/17 1115 05/29/17 0806  AST 121* 142*  ALT 77* 82*  ALKPHOS 40 31*  BILITOT 0.7 0.7  PROT 8.3* 6.7  ALBUMIN 4.2 3.4*   No results for input(s): LIPASE, AMYLASE in the last 168 hours. No results for input(s): AMMONIA in the last 168 hours. Coagulation Profile:  Recent Labs Lab 05/28/17 1115  INR 1.09   Cardiac Enzymes:  Recent Labs Lab 05/28/17 2037  CKTOTAL 234  TROPONINI <0.03   BNP (last 3 results) No results for input(s): PROBNP in the last 8760 hours. HbA1C: No results for input(s): HGBA1C in the last 72 hours. CBG:  Recent Labs Lab 05/28/17 2002 05/28/17 2330 05/29/17 0352 05/29/17 0802  GLUCAP 124* 130* 119* 129*   Lipid Profile: No results for input(s): CHOL, HDL, LDLCALC, TRIG, CHOLHDL, LDLDIRECT in the last 72 hours. Thyroid Function Tests:  Recent Labs  05/29/17 0806  TSH 3.973   Anemia Panel: No results for input(s): VITAMINB12, FOLATE, FERRITIN, TIBC, IRON, RETICCTPCT in the last 72 hours. Sepsis Labs:  Recent Labs Lab 05/28/17 1121 05/28/17 1449 05/28/17 2037 05/28/17 2245  PROCALCITON  --   --  0.22  --   LATICACIDVEN 1.70 1.50 0.9 0.9    No  results found for this or any previous visit (from the past 240 hour(s)).       Radiology Studies: Dg Chest 2 View  Result Date: 05/28/2017 CLINICAL DATA:  Fever and headache. EXAM: CHEST  2 VIEW COMPARISON:  Chest CT 02/23/2012.  Chest x-ray 02/22/2012. FINDINGS: AP and lateral views of the chest show hyperexpansion. No focal airspace consolidation. No pulmonary edema or pleural effusion. Cardiopericardial silhouette is at upper limits of normal for size. The visualized bony structures of the thorax are intact. Telemetry leads overlie the chest. IMPRESSION: No active cardiopulmonary disease. Electronically Signed   By: Misty Stanley M.D.   On: 05/28/2017 11:32   Ct Abdomen Pelvis W Contrast  Result Date: 05/28/2017 CLINICAL DATA:  Bilateral flank pain EXAM: CT ABDOMEN  AND PELVIS WITH CONTRAST TECHNIQUE: Multidetector CT imaging of the abdomen and pelvis was performed using the standard protocol following bolus administration of intravenous contrast. CONTRAST:  11mL ISOVUE-300 IOPAMIDOL (ISOVUE-300) INJECTION 61% COMPARISON:  03/14/2017 FINDINGS: Lower chest: Lung bases are free of acute infiltrate or sizable effusion. Hepatobiliary: Diffuse fatty infiltration of the liver is noted. The gallbladder is within normal limits. Pancreas: Unremarkable. No pancreatic ductal dilatation or surrounding inflammatory changes. Spleen: Normal in size without focal abnormality. Adrenals/Urinary Tract: Adrenal glands are unremarkable. Kidneys are normal, without renal calculi, focal lesion, or hydronephrosis. Bladder is unremarkable. Stomach/Bowel: Mild diverticular change of the colon is noted. No diverticulitis is seen. The appendix is within normal limits. No inflammatory changes are noted. Vascular/Lymphatic: Changes of prior aortic stent graft are noted. There are no findings to suggest endoleak. No significant lymphadenopathy is noted. Reproductive: Status post hysterectomy. No adnexal masses. Other: No abdominal  wall hernia or abnormality. No abdominopelvic ascites. Musculoskeletal: No acute or significant osseous findings. IMPRESSION: Postsurgical changes.  No acute abnormality noted. Electronically Signed   By: Inez Catalina M.D.   On: 05/28/2017 14:07        Scheduled Meds: . clopidogrel  75 mg Oral Daily  . doxycycline  100 mg Oral Q12H  . gemfibrozil  600 mg Oral BID AC  . insulin aspart  0-9 Units Subcutaneous Q4H  . insulin glargine  60 Units Subcutaneous QHS  . pantoprazole  40 mg Oral Daily  . sodium chloride flush  3 mL Intravenous Q12H   Continuous Infusions: . cefTRIAXone (ROCEPHIN)  IV       LOS: 1 day       Marshaun Lortie Gerome Apley, MD [facility] Pager [phone]  If 7PM-7AM, please contact night-coverage [email or web address] Password [facility] 05/29/2017, 10:32 AM

## 2017-05-30 ENCOUNTER — Inpatient Hospital Stay (HOSPITAL_COMMUNITY): Payer: Medicare HMO

## 2017-05-30 DIAGNOSIS — J9601 Acute respiratory failure with hypoxia: Secondary | ICD-10-CM

## 2017-05-30 DIAGNOSIS — E0800 Diabetes mellitus due to underlying condition with hyperosmolarity without nonketotic hyperglycemic-hyperosmolar coma (NKHHC): Secondary | ICD-10-CM

## 2017-05-30 DIAGNOSIS — E860 Dehydration: Secondary | ICD-10-CM

## 2017-05-30 DIAGNOSIS — R7989 Other specified abnormal findings of blood chemistry: Secondary | ICD-10-CM

## 2017-05-30 DIAGNOSIS — N390 Urinary tract infection, site not specified: Secondary | ICD-10-CM

## 2017-05-30 DIAGNOSIS — R0902 Hypoxemia: Secondary | ICD-10-CM

## 2017-05-30 LAB — CBC WITH DIFFERENTIAL/PLATELET
BASOS PCT: 3 %
Basophils Absolute: 0.1 10*3/uL (ref 0.0–0.1)
Eosinophils Absolute: 0 10*3/uL (ref 0.0–0.7)
Eosinophils Relative: 0 %
HEMATOCRIT: 35 % — AB (ref 36.0–46.0)
HEMOGLOBIN: 12 g/dL (ref 12.0–15.0)
LYMPHS PCT: 43 %
Lymphs Abs: 1.7 10*3/uL (ref 0.7–4.0)
MCH: 28.3 pg (ref 26.0–34.0)
MCHC: 34.3 g/dL (ref 30.0–36.0)
MCV: 82.5 fL (ref 78.0–100.0)
MONOS PCT: 10 %
Monocytes Absolute: 0.4 10*3/uL (ref 0.1–1.0)
NEUTROS ABS: 1.7 10*3/uL (ref 1.7–7.7)
Neutrophils Relative %: 44 %
Platelets: 142 10*3/uL — ABNORMAL LOW (ref 150–400)
RBC: 4.24 MIL/uL (ref 3.87–5.11)
RDW: 15.1 % (ref 11.5–15.5)
WBC: 3.9 10*3/uL — ABNORMAL LOW (ref 4.0–10.5)

## 2017-05-30 LAB — BASIC METABOLIC PANEL
Anion gap: 9 (ref 5–15)
BUN: 13 mg/dL (ref 6–20)
CO2: 25 mmol/L (ref 22–32)
CREATININE: 0.68 mg/dL (ref 0.44–1.00)
Calcium: 8.5 mg/dL — ABNORMAL LOW (ref 8.9–10.3)
Chloride: 100 mmol/L — ABNORMAL LOW (ref 101–111)
GFR calc Af Amer: >60 mL/min (ref >60)
GFR calc non Af Amer: >60 mL/min (ref >60)
Glucose, Bld: 133 mg/dL — ABNORMAL HIGH (ref 65–99)
Potassium: 3.5 mmol/L (ref 3.5–5.1)
Sodium: 134 mmol/L — ABNORMAL LOW (ref 135–145)

## 2017-05-30 LAB — HEMOGLOBIN A1C
HEMOGLOBIN A1C: 10.5 % — AB (ref 4.8–5.6)
Mean Plasma Glucose: 255 mg/dL

## 2017-05-30 LAB — HEPATIC FUNCTION PANEL
ALK PHOS: 29 U/L — AB (ref 38–126)
ALT: 86 U/L — ABNORMAL HIGH (ref 14–54)
AST: 124 U/L — ABNORMAL HIGH (ref 15–41)
Albumin: 3.3 g/dL — ABNORMAL LOW (ref 3.5–5.0)
BILIRUBIN TOTAL: 0.8 mg/dL (ref 0.3–1.2)
Bilirubin, Direct: 0.2 mg/dL (ref 0.1–0.5)
Indirect Bilirubin: 0.6 mg/dL (ref 0.3–0.9)
Total Protein: 6.7 g/dL (ref 6.5–8.1)

## 2017-05-30 LAB — GLUCOSE, CAPILLARY
GLUCOSE-CAPILLARY: 133 mg/dL — AB (ref 65–99)
GLUCOSE-CAPILLARY: 130 mg/dL — AB (ref 65–99)
Glucose-Capillary: 115 mg/dL — ABNORMAL HIGH (ref 65–99)

## 2017-05-30 LAB — URINE CULTURE: Culture: >=100000 — AB

## 2017-05-30 MED ORDER — TECHNETIUM TO 99M ALBUMIN AGGREGATED
5.0000 | Freq: Once | INTRAVENOUS | Status: AC | PRN
  Administered 2017-05-30: 5 via INTRAVENOUS

## 2017-05-30 MED ORDER — POTASSIUM CHLORIDE CRYS ER 20 MEQ PO TBCR
40.0000 meq | EXTENDED_RELEASE_TABLET | Freq: Once | ORAL | Status: AC
Start: 2017-05-30 — End: 2017-05-30
  Administered 2017-05-30: 40 meq via ORAL
  Filled 2017-05-30: qty 2

## 2017-05-30 MED ORDER — CEPHALEXIN 500 MG PO CAPS: 500.0000 mg | ORAL_CAPSULE | Freq: Three times a day (TID) | ORAL | 0 refills | Status: AC

## 2017-05-30 MED ORDER — TECHNETIUM TC 99M DIETHYLENETRIAME-PENTAACETIC ACID
31.4000 | Freq: Once | INTRAVENOUS | Status: AC | PRN
  Administered 2017-05-30: 31.4 via INTRAVENOUS

## 2017-05-30 MED ORDER — IPRATROPIUM-ALBUTEROL 0.5-2.5 (3) MG/3ML IN SOLN: 3.0000 mL | RESPIRATORY_TRACT | 0 refills | Status: DC | PRN

## 2017-05-30 MED ORDER — IPRATROPIUM-ALBUTEROL 0.5-2.5 (3) MG/3ML IN SOLN: 3.0000 mL | RESPIRATORY_TRACT | Status: DC | PRN

## 2017-05-30 NOTE — Evaluation (Signed)
Physical Therapy Evaluation Patient Details Name: Mandy Kim MRN: [id number] DOB: 1946-02-16 Today's Date: 05/30/2017   History of Present Illness  Mandy Kim is a 71 y.o. female with medical history significant of CKD and diabetes mellitus, HLD, patchy liver disease with elevated LFTs diabetic neuropathy, recurrent UTIs, breast cancer, sleep apnea on CPAP  Clinical Impression  Patient evaluated by Physical Therapy with no further acute PT needs identified. All education has been completed and the patient has no further questions. * See below for any follow-up Physical Therapy or equipment needs. PT is signing off. Thank you for this referral.     Follow Up Recommendations No PT follow up    Equipment Recommendations  None recommended by PT    Recommendations for Other Services       Precautions / Restrictions Precautions Precautions: Fall Restrictions Weight Bearing Restrictions: No      Mobility  Bed Mobility Overal bed mobility: Needs Assistance Bed Mobility: Supine to Sit     Supine to sit: Supervision        Transfers Overall transfer level: Needs assistance Equipment used: None Transfers: Sit to/from Stand Sit to Stand: Supervision         General transfer comment: for safety  Ambulation/Gait Ambulation/Gait assistance: Supervision;Min guard Ambulation Distance (Feet): 95 Feet Assistive device: None Gait Pattern/deviations: Step-through pattern;Drifts right/left     General Gait Details: pt mildly unsteady but no overt LOB;   Stairs            Wheelchair Mobility    Modified Rankin (Stroke Patients Only)       Balance Overall balance assessment: Needs assistance             Standing balance comment: Fari to Good             High level balance activites: Direction changes;Turns;Head turns High Level Balance Comments: pt tends to stop walking to turn head, guarded with truns, no LOB             Pertinent  Vitals/Pain Pain Assessment: 0-10 Pain Score: 3  Pain Location: R hip Pain Descriptors / Indicators: Sore Pain Intervention(s): Monitored during session    Home Living Family/patient expects to be discharged to:: Private residence Living Arrangements: Spouse/significant other;Children   Type of Home: House Home Access: Stairs to enter;Ramped entrance   Entrance Stairs-Number of Steps: 3 Home Layout: One level Home Equipment: None      Prior Function Level of Independence: Independent               Hand Dominance        Extremity/Trunk Assessment   Upper Extremity Assessment Upper Extremity Assessment: Defer to OT evaluation    Lower Extremity Assessment Lower Extremity Assessment: Overall WFL for tasks assessed       Communication   Communication: No difficulties  Cognition Arousal/Alertness: Awake/alert Behavior During Therapy: WFL for tasks assessed/performed;Flat affect   Area of Impairment: Following commands;Problem solving                       Following Commands: Follows one step commands consistently     Problem Solving: Slow processing        General Comments      Exercises     Assessment/Plan    PT Assessment Patent does not need any further PT services  PT Problem List         PT Treatment Interventions  PT Goals (Current goals can be found in the Care Plan section)  Acute Rehab PT Goals Patient Stated Goal: home soon PT Goal Formulation: All assessment and education complete, DC therapy    Frequency     Barriers to discharge        Co-evaluation               AM-PAC PT "6 Clicks" Daily Activity  Outcome Measure Difficulty turning over in bed (including adjusting bedclothes, sheets and blankets)?: None Difficulty moving from lying on back to sitting on the side of the bed? : A Little Difficulty sitting down on and standing up from a chair with arms (e.g., wheelchair, bedside commode, etc,.)?: A  Little Help needed moving to and from a bed to chair (including a wheelchair)?: A Little Help needed walking in hospital room?: A Little Help needed climbing 3-5 steps with a railing? : A Little 6 Click Score: 19    End of Session Equipment Utilized During Treatment: Gait belt Activity Tolerance: Patient tolerated treatment well Patient left: Other (comment);with family/visitor present (with OT in bathroom) Nurse Communication: Mobility status PT Visit Diagnosis: Unsteadiness on feet (R26.81)    Time: 2574-9355 PT Time Calculation (min) (ACUTE ONLY): 14 min   Charges:   PT Evaluation $PT Eval Low Complexity: 1 Procedure     PT G CodesKenyon Ana, PT Pager: [phone] 05/30/2017   [facility] 05/30/2017, 11:42 AM

## 2017-05-30 NOTE — Progress Notes (Signed)
To NUC med

## 2017-05-30 NOTE — Progress Notes (Signed)
*  PRELIMINARY RESULTS* Vascular Ultrasound Lower extremity venous duplex has been completed.  Preliminary findings: No evidence of DVT or baker's cyst.   Landry Mellow, RDMS, RVT  05/30/2017, 10:40 AM

## 2017-05-30 NOTE — Discharge Summary (Signed)
Mandy Kim OIZ:[id number] DOB: 07/17/1946 DOA: 05/28/2017  PCP: Darreld Mclean, MD  Admit date: 05/28/2017  Discharge date: 05/30/2017  Admitted From: Home   Disposition:  Home   Recommendations for Outpatient Follow-up:   Follow up with PCP in 1-2 weeks  PCP Please obtain BMP/CBC, 2 view CXR in 1week,  (see Discharge instructions)   PCP Please follow up on the following pending results: Outpatient pulmonary follow-up, monitor her clinically, monitor CBC, CMP and 2 view chest x-ray   Home Health: None Equipment/Devices: None  Consultations: None Discharge Condition: Stable   CODE STATUS: Full   Diet Recommendation: Diet Carb Modified  Heart Healthy     Chief Complaint  Patient presents with  . Headache  . Flank Pain     Brief history of present illness from the day of admission and additional interim summary    71 year old female presented with left flank pain dysuria and urinary urgency. She is known to have chronic kidney disease, type 2 diabetes mellitus, dyslipidemia, patchy liver disease, diabetic neuropathy, recurrent urinary tract infections, history of breast cancer. Worsening symptoms for last 3 days prior to hospitalization, associated with fevers, dark urine. On initial physical examination, her temperature was 102.7, respiratory rate of 32, oxygen saturation 86%, blood pressure 118/53, heart rate 93 beats per minutes. Her oral mucosa was dry, her lungs were clear to auscultation, heart S1-S2 present tachycardic, no gallops or murmurs, the abdomen was soft nontender, no lower extremity edema. Patient was admitted to the hospital working diagnosis of sepsis due to urinary tract infection, present on admission.                                                                 Hospital Course     1. Sepsis due to urinary tract infection, present on admission Along with rhinovirus URI. She was treated with IV fluids and empiric IV antibiotics, switched to Keflex for 2 more days, blood cultures negative, she is clinically depressed and feeling fine, she currently wants to go home, is not short of breath, no oxygen demand, she has been walking in the room, we'll place her on oral antibiotics and discharge her home with PCP follow-up.   2. Dehydration.  resolved after hydration.   3. Type 2 diabetes mellitus. Continue home regimen and follow with PCP.   4. Elevated liver enzymes. Due to viral infection, LFTs trending down, total bili stable, PCP to monitor.     5. High d dimer. Due to infections, negative lower extremity duplex and VQ scan.   6. Fever due to Positive rhinovirus and UTI. Outpatient supportive care. Antibiotics as a #1.  7. VQ scan suggestive of possible undiagnosed COPD. As needed nebulizer treatment. We'll recommend PCP to arrange for one-time outpatient pulmonary follow-up and PFTs.  Discharge diagnosis     Active Problems:   Diabetes mellitus (HCC)   Hyperlipidemia   Sepsis ([place])   Elevated LFTs   Acute lower UTI   Headache   Dehydration   Hypoxia    Discharge instructions    Discharge Instructions    Discharge instructions    Complete by:  As directed    Follow with Primary MD Copland, Gay Filler, MD in 7 days   Get CBC, CMP, 2 view Chest X ray checked  by Primary MD or SNF MD in 5-7 days ( we routinely change or add medications that can affect your baseline labs and fluid status, therefore we recommend that you get the mentioned basic workup next visit with your PCP, your PCP may decide not to get them or add new tests based on their clinical decision)  Activity: As tolerated with Full fall precautions use walker/cane & assistance as needed  Disposition Home   Diet:   Diet Carb Modified Heart Healthy   Accuchecks 4 times/day, Once in  AM empty stomach and then before each meal. Log in all results and show them to your Prim.MD in 3 days. If any glucose reading is under 80 or above 300 call your Prim MD immidiately. Follow Low glucose instructions for glucose under 80 as instructed.  For Heart failure patients - Check your Weight same time everyday, if you gain over 2 pounds, or you develop in leg swelling, experience more shortness of breath or chest pain, call your Primary MD immediately. Follow Cardiac Low Salt Diet and 1.5 lit/day fluid restriction.  On your next visit with your primary care physician please Get Medicines reviewed and adjusted.  Please request your Prim.MD to go over all Hospital Tests and Procedure/Radiological results at the follow up, please get all Hospital records sent to your Prim MD by signing hospital release before you go home.  If you experience worsening of your admission symptoms, develop shortness of breath, life threatening emergency, suicidal or homicidal thoughts you must seek medical attention immediately by calling 911 or calling your MD immediately  if symptoms less severe.  You Must read complete instructions/literature along with all the possible adverse reactions/side effects for all the Medicines you take and that have been prescribed to you. Take any new Medicines after you have completely understood and accpet all the possible adverse reactions/side effects.   Do not drive, operate heavy machinery, perform activities at heights, swimming or participation in water activities or provide baby sitting services if your were admitted for syncope or siezures until you have seen by Primary MD or a Neurologist and advised to do so again.  Do not drive when taking Pain medications.    Do not take more than prescribed Pain, Sleep and Anxiety Medications  Special Instructions: If you have smoked or chewed Tobacco  in the last 2 yrs please stop smoking, stop any regular Alcohol  and or any  Recreational drug use.  Wear Seat belts while driving.   Please note  You were cared for by a hospitalist during your hospital stay. If you have any questions about your discharge medications or the care you received while you were in the hospital after you are discharged, you can call the unit and asked to speak with the hospitalist on call if the hospitalist that took care of you is not available. Once you are discharged, your primary care physician will handle any further medical issues. Please note that NO REFILLS for any discharge  medications will be authorized once you are discharged, as it is imperative that you return to your primary care physician (or establish a relationship with a primary care physician if you do not have one) for your aftercare needs so that they can reassess your need for medications and monitor your lab values.   Increase activity slowly    Complete by:  As directed       Discharge Medications   Allergies as of 05/30/2017      Reactions   Aspirin    Per pt any pain medications   Crestor [rosuvastatin Calcium]    Ibuprofen Swelling   Lipitor [atorvastatin Calcium]    Pravastatin    Simvastatin    Trulicity [dulaglutide] Other (See Comments)   flatulance   Tylenol [acetaminophen] Swelling      Medication List    STOP taking these medications   amoxicillin 875 MG tablet Commonly known as:  AMOXIL     TAKE these medications   Alcohol Swabs Pads Use to test blood sugar 3 times daily. Dx: E11.9   b complex vitamins tablet Take 1 tablet by mouth daily.   Biotin 1000 MCG tablet Take 1,000 mcg by mouth daily.   cephALEXin 500 MG capsule Commonly known as:  KEFLEX Take 1 capsule (500 mg total) by mouth 3 (three) times daily.   cholecalciferol 1000 units tablet Commonly known as:  VITAMIN D Take 1,000 Units by mouth daily.   clopidogrel 75 MG tablet Commonly known as:  PLAVIX TAKE 1 TABLET (75 MG TOTAL) BY MOUTH DAILY.   gabapentin 100 MG  capsule Commonly known as:  NEURONTIN Take 1 capsule (100 mg total) by mouth at bedtime. Can increase to 200 or 300 mg as needed for foot pain   gemfibrozil 600 MG tablet Commonly known as:  LOPID TAKE 1 TABLET (600 MG TOTAL) BY MOUTH 2 (TWO) TIMES DAILY BEFORE A MEAL.   Glucose Control Soln Use to test blood sugar 3 times daily. Dx: E11.9   insulin glargine 100 UNIT/ML injection Commonly known as:  LANTUS Inject 0.8 mLs (80 Units total) into the skin at bedtime. What changed:  when to take this   INSULIN SYRINGE 1CC/30GX5/16" 30G X 5/16" 1 ML Misc Use to inject insulin 1 time per day.   ipratropium-albuterol 0.5-2.5 (3) MG/3ML Soln Commonly known as:  DUONEB Take 3 mLs by nebulization every 4 (four) hours as needed.   KLOR-CON M20 20 MEQ tablet Generic drug:  potassium chloride SA TAKE 1 TABLET (20 MEQ TOTAL) BY MOUTH DAILY.   levocetirizine 5 MG tablet Commonly known as:  XYZAL Take 1 tablet (5 mg total) by mouth every evening. What changed:  when to take this   metFORMIN 1000 MG tablet Commonly known as:  GLUCOPHAGE TAKE 1 TABLET (500 MG TOTAL) BY MOUTH 2 (TWO) TIMES DAILY WITH A MEAL.   multivitamin tablet Take 0.5 tablets by mouth 2 (two) times daily.   niacin 500 MG CR tablet Commonly known as:  NIASPAN Take 1 tablet (500 mg total) by mouth at bedtime.   pantoprazole 40 MG tablet Commonly known as:  PROTONIX TAKE 1 TABLET BY MOUTH EVERY DAY ON AN EMPTY STOMACH   TRUE METRIX AIR GLUCOSE METER w/Device Kit USE  TO TEST BLOOD SUGAR THREE TIMES DAILY   TRUE METRIX BLOOD GLUCOSE TEST test strip Generic drug:  glucose blood USE AS DIRECTED TO TEST BLOOD SUGAR THREE TIMES DAILY   vitamin C 500 MG tablet Commonly known as:  ASCORBIC ACID Take 500 mg by mouth daily.            Durable Medical Equipment        Start     Ordered   05/30/17 1108  For home use only DME Nebulizer/meds  Once    Question:  Patient needs a nebulizer to treat with the  following condition  Answer:  COPD (chronic obstructive pulmonary disease) ([place])   05/30/17 1107      Follow-up Information    Copland, Gay Filler, MD. Schedule an appointment as soon as possible for a visit in 1 week(s).   Specialty:  Family Medicine Contact information: [place] [address] [phone]           Major procedures and Radiology Reports - PLEASE review detailed and final reports thoroughly  -     Vascular Ultrasound  Lower extremity venous duplex has been completed.  Preliminary findings: No evidence of DVT or baker's cyst.    X-ray Chest Pa And Lateral  Result Date: 05/29/2017 CLINICAL DATA:  Hypoxia EXAM: CHEST  2 VIEW COMPARISON:  05/28/2017 FINDINGS: Cardiac shadow is stable. Aortic calcifications are again seen. Lungs are clear bilaterally. Mild degenerative change of the thoracic spine is noted. IMPRESSION: No active cardiopulmonary disease. Electronically Signed   By: Inez Catalina M.D.   On: 05/29/2017 11:19   Dg Chest 2 View  Result Date: 05/28/2017 CLINICAL DATA:  Fever and headache. EXAM: CHEST  2 VIEW COMPARISON:  Chest CT 02/23/2012.  Chest x-ray 02/22/2012. FINDINGS: AP and lateral views of the chest show hyperexpansion. No focal airspace consolidation. No pulmonary edema or pleural effusion. Cardiopericardial silhouette is at upper limits of normal for size. The visualized bony structures of the thorax are intact. Telemetry leads overlie the chest. IMPRESSION: No active cardiopulmonary disease. Electronically Signed   By: Misty Stanley M.D.   On: 05/28/2017 11:32   Ct Abdomen Pelvis W Contrast  Result Date: 05/28/2017 CLINICAL DATA:  Bilateral flank pain EXAM: CT ABDOMEN AND PELVIS WITH CONTRAST TECHNIQUE: Multidetector CT imaging of the abdomen and pelvis was performed using the standard protocol following bolus administration of intravenous contrast. CONTRAST:  89m ISOVUE-300 IOPAMIDOL (ISOVUE-300) INJECTION 61%  COMPARISON:  03/14/2017 FINDINGS: Lower chest: Lung bases are free of acute infiltrate or sizable effusion. Hepatobiliary: Diffuse fatty infiltration of the liver is noted. The gallbladder is within normal limits. Pancreas: Unremarkable. No pancreatic ductal dilatation or surrounding inflammatory changes. Spleen: Normal in size without focal abnormality. Adrenals/Urinary Tract: Adrenal glands are unremarkable. Kidneys are normal, without renal calculi, focal lesion, or hydronephrosis. Bladder is unremarkable. Stomach/Bowel: Mild diverticular change of the colon is noted. No diverticulitis is seen. The appendix is within normal limits. No inflammatory changes are noted. Vascular/Lymphatic: Changes of prior aortic stent graft are noted. There are no findings to suggest endoleak. No significant lymphadenopathy is noted. Reproductive: Status post hysterectomy. No adnexal masses. Other: No abdominal wall hernia or abnormality. No abdominopelvic ascites. Musculoskeletal: No acute or significant osseous findings. IMPRESSION: Postsurgical changes.  No acute abnormality noted. Electronically Signed   By: MInez CatalinaM.D.   On: 05/28/2017 14:07   Nm Pulmonary Perf And Vent  Result Date: 05/30/2017 CLINICAL DATA:  Delays, fatigue, shortness of breath, pain, history diabetes mellitus, chronic kidney disease EXAM: NUCLEAR MEDICINE VENTILATION - PERFUSION LUNG SCAN TECHNIQUE: Ventilation images were obtained in multiple projections using inhaled aerosol Tc-941mTPA. Perfusion images were obtained in multiple projections after intravenous injection  of Tc-67mMAA. RADIOPHARMACEUTICALS:  31.4 mCi Technetium-927mTPA aerosol inhalation and 5.0 mCi Technetium-9912mA IV COMPARISON:  None Correlate chest radiograph:  05/29/2017 FINDINGS: Ventilation: Patchy peripheral aerosol distribution in both lungs suggesting parenchymal lung disease/COPD. Small subsegmental ventilation defect lateral RIGHT middle lobe. Additional small  subsegmental ventilation defect LEFT lower lobe. Swallowed aerosol within stomach. Perfusion: Mild peripheral irregularity of perfusion. Matching subsegmental perfusion defect LEFT lower lobe. Remaining perfusion normal. Chest radiograph: Upper normal size of cardiac silhouette. Hyperinflation. Mild chronic peribronchial thickening and interstitial prominence. No definite acute infiltrate. IMPRESSION: Patchy ventilation suggesting parenchymal lung disease/COPD. Matching subsegmental ventilation and perfusion defects in LEFT lower lobe. Findings represent a very low probability for pulmonary embolism. Electronically Signed   By: MarLavonia DanaD.   On: 05/30/2017 09:44    Micro Results     Recent Results (from the past 240 hour(s))  Culture, blood (Routine x 2)     Status: None (Preliminary result)   Collection Time: 05/28/17 11:15 AM  Result Value Ref Range Status   Specimen Description BLOOD LEFT ARM  Final   Special Requests   Final    BOTTLES DRAWN AEROBIC AND ANAEROBIC Blood Culture adequate volume   Culture   Final    NO GROWTH 1 DAY Performed at [facility] [place] [id number] Report Status PENDING  Incomplete  Culture, blood (Routine x 2)     Status: None (Preliminary result)   Collection Time: 05/28/17 11:35 AM  Result Value Ref Range Status   Specimen Description BLOOD LEFT FOREARM  Final   Special Requests   Final    BOTTLES DRAWN AEROBIC AND ANAEROBIC Blood Culture results may not be optimal due to an excessive volume of blood received in culture bottles   Culture   Final    NO GROWTH 1 DAY Performed at [facility] [place] [id number] Report Status PENDING  Incomplete  Urine culture     Status: Abnormal   Collection Time: 05/28/17  1:25 PM  Result Value Ref Range Status   Specimen Description URINE, RANDOM  Final   Special Requests NONE  Final   Culture >=100,000 COLONIES/mL ESCHERICHIA COLI (A)  Final    Report Status 05/30/2017 FINAL  Final   Organism ID, Bacteria ESCHERICHIA COLI (A)  Final      Susceptibility   Escherichia coli - MIC*    AMPICILLIN >=32 RESISTANT Resistant     CEFAZOLIN 8 SENSITIVE Sensitive     CEFTRIAXONE <=1 SENSITIVE Sensitive     CIPROFLOXACIN <=0.25 SENSITIVE Sensitive     GENTAMICIN <=1 SENSITIVE Sensitive     IMIPENEM <=0.25 SENSITIVE Sensitive     NITROFURANTOIN <=16 SENSITIVE Sensitive     TRIMETH/SULFA <=20 SENSITIVE Sensitive     AMPICILLIN/SULBACTAM >=32 RESISTANT Resistant     PIP/TAZO 8 SENSITIVE Sensitive     Extended ESBL NEGATIVE Sensitive     * >=100,000 COLONIES/mL ESCHERICHIA COLI  Respiratory Panel by PCR     Status: Abnormal   Collection Time: 05/28/17  8:12 PM  Result Value Ref Range Status   Adenovirus NOT DETECTED NOT DETECTED Final   Coronavirus 229E NOT DETECTED NOT DETECTED Final   Coronavirus HKU1 NOT DETECTED NOT DETECTED Final   Coronavirus NL63 NOT DETECTED NOT DETECTED Final   Coronavirus OC43 NOT DETECTED NOT DETECTED Final   Metapneumovirus NOT DETECTED NOT DETECTED Final   Rhinovirus / Enterovirus  DETECTED (A) NOT DETECTED Final   Influenza A NOT DETECTED NOT DETECTED Final   Influenza B NOT DETECTED NOT DETECTED Final   Parainfluenza Virus 1 NOT DETECTED NOT DETECTED Final   Parainfluenza Virus 2 NOT DETECTED NOT DETECTED Final   Parainfluenza Virus 3 NOT DETECTED NOT DETECTED Final   Parainfluenza Virus 4 NOT DETECTED NOT DETECTED Final   Respiratory Syncytial Virus NOT DETECTED NOT DETECTED Final   Bordetella pertussis NOT DETECTED NOT DETECTED Final   Chlamydophila pneumoniae NOT DETECTED NOT DETECTED Final   Mycoplasma pneumoniae NOT DETECTED NOT DETECTED Final    Today   Subjective    Arielis Knipple today has no headache,no chest abdominal pain,no new weakness tingling or numbness, feels much better wants to go home today.     Objective   Blood pressure (!) 122/53, pulse 74, temperature 100.2 F (37.9 C),  temperature source Axillary, resp. rate 20, height 5' 4"  (1.626 m), weight 79.4 kg (175 lb), SpO2 94 %.   Intake/Output Summary (Last 24 hours) at 05/30/17 1124 Last data filed at 05/30/17 0800  Gross per 24 hour  Intake             1830 ml  Output                0 ml  Net             1830 ml    Exam Awake Alert, Oriented x 3, No new F.N deficits, Normal affect [place].AT,PERRAL Supple Neck,No JVD, No cervical lymphadenopathy appriciated.  Symmetrical Chest wall movement, Good air movement bilaterally, CTAB RRR,No Gallops,Rubs or new Murmurs, No Parasternal Heave +ve B.Sounds, Abd Soft, Non tender, No organomegaly appriciated, No rebound -guarding or rigidity. No Cyanosis, Clubbing or edema, No new Rash or bruise   Data Review   CBC w Diff:  Lab Results  Component Value Date   WBC 3.9 (L) 05/30/2017   HGB 12.0 05/30/2017   HCT 35.0 (L) 05/30/2017   PLT 142 (L) 05/30/2017   LYMPHOPCT 43 05/30/2017   MONOPCT 10 05/30/2017   EOSPCT 0 05/30/2017   BASOPCT 3 05/30/2017    CMP:  Lab Results  Component Value Date   NA 134 (L) 05/30/2017   K 3.5 05/30/2017   CL 100 (L) 05/30/2017   CO2 25 05/30/2017   BUN 13 05/30/2017   CREATININE 0.68 05/30/2017   CREATININE 0.79 08/18/2015   PROT 6.7 05/30/2017   ALBUMIN 3.3 (L) 05/30/2017   BILITOT 0.8 05/30/2017   ALKPHOS 29 (L) 05/30/2017   AST 124 (H) 05/30/2017   ALT 86 (H) 05/30/2017  .   Total Time in preparing paper work, data evaluation and todays exam - 44 minutes  Lala Lund M.D on 05/30/2017 at 11:24 AM  [facility]   Office  [phone]

## 2017-05-30 NOTE — Progress Notes (Signed)
DME delivered.D/c instructions given w/ verbal understanding.

## 2017-05-30 NOTE — Evaluation (Addendum)
Occupational Therapy Evaluation Patient Details Name: Mandy Kim MRN: [id number] DOB: 11-07-1946 Today's Date: 05/30/2017    History of Present Illness Arlin FREADA TWERSKY is a 71 y.o. female with medical history significant of CKD and diabetes mellitus, HLD, patchy liver disease with elevated LFTs diabetic neuropathy, recurrent UTIs, breast cancer, sleep apnea on CPAP   Clinical Impression   Pt was seen for initial evaluation. She reports that she feels a little weaker than baseline, She is overall set up and min guard for LB adls as she prefers to stand for these.  She plans home today and will not need follow up OT.    Follow Up Recommendations  No OT follow up;Supervision/Assistance - 24 hour    Equipment Recommendations  None recommended by OT    Recommendations for Other Services       Precautions / Restrictions Precautions Precautions: Fall Restrictions Weight Bearing Restrictions: No      Mobility Bed Mobility Overal bed mobility: Needs Assistance Bed Mobility: Supine to Sit     Supine to sit: Supervision     General bed mobility comments: oob  Transfers Overall transfer level: Needs assistance Equipment used: None Transfers: Sit to/from Stand Sit to Stand: Supervision         General transfer comment: for safety    Balance Overall balance assessment: Needs assistance             Standing balance comment: Fari to Good             High level balance activites: Direction changes;Turns;Head turns High Level Balance Comments: pt tends to stop walking to turn head, guarded with truns, no LOB           ADL either performed or assessed with clinical judgement   ADL Overall ADL's : Needs assistance/impaired             Lower Body Bathing: Min guard;Sit to/from stand       Lower Body Dressing: Min guard;Sit to/from stand   Toilet Transfer: Supervision/safety;Ambulation;Comfort height toilet             General ADL  Comments: ambulated to bathroom and performed ADL at sink with set up; min guard for LB bathing and dressing due to standing and lifting one foot at a time. Recommended she sit down, but she did not want to do this.  No LOB.       Vision         Perception     Praxis      Pertinent Vitals/Pain Pain Assessment: Faces Pain Score: 3  Faces Pain Scale: Hurts even more Pain Location: headache Pain Descriptors / Indicators: Sore Pain Intervention(s):  (cool cloth given)     Hand Dominance     Extremity/Trunk Assessment Upper Extremity Assessment Upper Extremity Assessment: Overall WFL for tasks assessed          Communication Communication Communication: No difficulties   Cognition Arousal/Alertness: Awake/alert Behavior During Therapy: WFL for tasks assessed/performed;Flat affect Overall Cognitive Status:  Area of Impairment: Following commands;Problem solving                       Following Commands: Follows one step commands consistently     Problem Solving: Slow processing. Cues for safety     General Comments       Exercises     Shoulder Instructions      Home Living Family/patient expects to be discharged to:: Private residence Living  Arrangements: Spouse/significant other;Children Available Help at Discharge: Family Type of Home: House Home Access: Stairs to enter;Ramped entrance Entrance Stairs-Number of Steps: 3   Home Layout: One level     Bathroom Shower/Tub: Occupational psychologist: Standard     Home Equipment: None          Prior Functioning/Environment Level of Independence: Independent                 OT Problem List:        OT Treatment/Interventions:      OT Goals(Current goals can be found in the care plan section) Acute Rehab OT Goals Patient Stated Goal: home soon OT Goal Formulation: All assessment and education complete, DC therapy  OT Frequency:     Barriers to D/C:             Co-evaluation              AM-PAC PT "6 Clicks" Daily Activity     Outcome Measure Help from another person eating meals?: None Help from another person taking care of personal grooming?: A Little Help from another person toileting, which includes using toliet, bedpan, or urinal?: A Little Help from another person bathing (including washing, rinsing, drying)?: A Little Help from another person to put on and taking off regular upper body clothing?: A Little Help from another person to put on and taking off regular lower body clothing?: A Little 6 Click Score: 19   End of Session    Activity Tolerance: Patient tolerated treatment well Patient left: in bed;with call bell/phone within reach  OT Visit Diagnosis: Muscle weakness (generalized) (M62.81)                Time: 8875-7972 OT Time Calculation (min): 13 min Charges:  OT General Charges $OT Visit: 1 Procedure OT Evaluation $OT Eval Low Complexity: 1 Procedure G-Codes:     [place], OTR/L [phone] 05/30/2017  [place]a [place] 05/30/2017, 12:16 PM

## 2017-05-30 NOTE — Discharge Instructions (Signed)
Follow with Primary MD Copland, Gay Filler, MD in 7 days   Get CBC, CMP, 2 view Chest X ray checked  by Primary MD or SNF MD in 5-7 days ( we routinely change or add medications that can affect your baseline labs and fluid status, therefore we recommend that you get the mentioned basic workup next visit with your PCP, your PCP may decide not to get them or add new tests based on their clinical decision)  Activity: As tolerated with Full fall precautions use walker/cane & assistance as needed  Disposition Home   Diet:   Diet Carb Modified Heart Healthy   Accuchecks 4 times/day, Once in AM empty stomach and then before each meal. Log in all results and show them to your Prim.MD in 3 days. If any glucose reading is under 80 or above 300 call your Prim MD immidiately. Follow Low glucose instructions for glucose under 80 as instructed.   For Heart failure patients - Check your Weight same time everyday, if you gain over 2 pounds, or you develop in leg swelling, experience more shortness of breath or chest pain, call your Primary MD immediately. Follow Cardiac Low Salt Diet and 1.5 lit/day fluid restriction.  On your next visit with your primary care physician please Get Medicines reviewed and adjusted.  Please request your Prim.MD to go over all Hospital Tests and Procedure/Radiological results at the follow up, please get all Hospital records sent to your Prim MD by signing hospital release before you go home.  If you experience worsening of your admission symptoms, develop shortness of breath, life threatening emergency, suicidal or homicidal thoughts you must seek medical attention immediately by calling 911 or calling your MD immediately  if symptoms less severe.  You Must read complete instructions/literature along with all the possible adverse reactions/side effects for all the Medicines you take and that have been prescribed to you. Take any new Medicines after you have completely understood  and accpet all the possible adverse reactions/side effects.   Do not drive, operate heavy machinery, perform activities at heights, swimming or participation in water activities or provide baby sitting services if your were admitted for syncope or siezures until you have seen by Primary MD or a Neurologist and advised to do so again.  Do not drive when taking Pain medications.    Do not take more than prescribed Pain, Sleep and Anxiety Medications  Special Instructions: If you have smoked or chewed Tobacco  in the last 2 yrs please stop smoking, stop any regular Alcohol  and or any Recreational drug use.  Wear Seat belts while driving.   Please note  You were cared for by a hospitalist during your hospital stay. If you have any questions about your discharge medications or the care you received while you were in the hospital after you are discharged, you can call the unit and asked to speak with the hospitalist on call if the hospitalist that took care of you is not available. Once you are discharged, your primary care physician will handle any further medical issues. Please note that NO REFILLS for any discharge medications will be authorized once you are discharged, as it is imperative that you return to your primary care physician (or establish a relationship with a primary care physician if you do not have one) for your aftercare needs so that they can reassess your need for medications and monitor your lab values.

## 2017-05-30 NOTE — Progress Notes (Signed)
Awaiting ride and DME.

## 2017-05-30 NOTE — Care Management Note (Signed)
Case Management Note  Patient Details  Name: Mandy Kim MRN: [id number] Date of Birth: 1946/01/30  Subjective/Objective:70 y/o f admitted w/Sepsis. From home. PT/OT-no f/u. Ordered for neb machine-[facility] rep Kim aware of order, & d/c today-will deliver neb machine to rm prior d/c-Nsg notified.                    Action/Plan:d/c home w/dme   Expected Discharge Date:  05/30/17               Expected Discharge Plan:  Home/Self Care  In-House Referral:     Discharge planning Services  CM Consult  Post Acute Care Choice:    Choice offered to:  Patient  DME Arranged:  Nebulizer machine DME Agency:  [place]:    [facility] Agency:     Status of Service:  Completed, signed off  If discussed at [place] of Stay Meetings, dates discussed:    Additional Comments:  Dessa Phi, RN 05/30/2017, 1:01 PM

## 2017-05-31 ENCOUNTER — Telehealth: Payer: Self-pay

## 2017-05-31 LAB — ROCKY MTN SPOTTED FVR ABS PNL(IGG+IGM)
RMSF IgG: NEGATIVE
RMSF IgM: 0.9 index — ABNORMAL HIGH (ref 0.00–0.89)

## 2017-05-31 NOTE — Telephone Encounter (Signed)
Transition Care Management Follow-up Telephone Call  ADMISSION DATE: 05/28/17  DISCHARGE DATE: 05/30/17   How have you been since you were released from the hospital? Weak per patient  Do you understand why you were in the hospital? Yes per patient   Do you understand the discharge instrcutions? Yes however she does have some questions    Items Reviewed:  Medications reviewed:  Yes   Allergies reviewed:Yes   Dietary changes reviewed: Carbohydrate Modified Heart Healthy   Referrals reviewed: Yes   Functional Questionnaire:   Activities of Daily Living (ADLs): Patient states she can do all  Any patient concerns?  Patient states she feels the is a ne strand of strep throat which will give a false negative result.    Confirmed importance and date/time of follow-up visits scheduled: Yes Confirmed with patient if condition begins to worsen call PCP or go to the ER.  Yes   Patient was given the office number and encouragred to call back with questions or concerns. Yes

## 2017-06-01 ENCOUNTER — Telehealth: Payer: Self-pay | Admitting: Family Medicine

## 2017-06-01 DIAGNOSIS — W57XXXS Bitten or stung by nonvenomous insect and other nonvenomous arthropods, sequela: Secondary | ICD-10-CM

## 2017-06-01 MED ORDER — DOXYCYCLINE HYCLATE 100 MG PO CAPS: 100.0000 mg | ORAL_CAPSULE | Freq: Two times a day (BID) | ORAL | 0 refills | Status: DC

## 2017-06-01 NOTE — Telephone Encounter (Signed)
Pt was admitted from 6/25 to 6/27 with urosepsis. I have gotten some follow-up labs; blood culture, RMSF titer.  Her RMSF is equivocal.  She states that she is still feeling bad and has a HA., although she does not have a fever.   Will go ahead and cover for RMSF with doxy She will start on this today  She has an appt to see me on 7/9, but we will move this up to Monday at 11:30 instead  Pt is aware of Monday appt, will start on doxy today.  If not ok this weekend she will seek emergency services

## 2017-06-01 NOTE — Telephone Encounter (Signed)
Pt's spouse called in he said that pt was recently discharged from hospital. He said that they forgot to ask or inform the Dr's there that pt have a spot in her head that she have pain at times, he would like to know if provider is able to put in a referral to a neurologist for scanning? He is also requesting a call from PCP if possible?   Phone:  [phone] OR [phone] (spouse, Gwyndolyn Saxon)

## 2017-06-02 LAB — CULTURE, BLOOD (ROUTINE X 2)
CULTURE: NO GROWTH
CULTURE: NO GROWTH
Special Requests: ADEQUATE

## 2017-06-04 ENCOUNTER — Ambulatory Visit (INDEPENDENT_AMBULATORY_CARE_PROVIDER_SITE_OTHER): Payer: Medicare HMO | Admitting: Family Medicine

## 2017-06-04 VITALS — BP 112/60 | HR 65 | Temp 97.9°F | Ht 64.0 in | Wt 169.4 lb

## 2017-06-04 DIAGNOSIS — R7989 Other specified abnormal findings of blood chemistry: Secondary | ICD-10-CM

## 2017-06-04 DIAGNOSIS — R945 Abnormal results of liver function studies: Secondary | ICD-10-CM

## 2017-06-04 DIAGNOSIS — N390 Urinary tract infection, site not specified: Secondary | ICD-10-CM | POA: Diagnosis not present

## 2017-06-04 DIAGNOSIS — E118 Type 2 diabetes mellitus with unspecified complications: Secondary | ICD-10-CM

## 2017-06-04 DIAGNOSIS — W57XXXS Bitten or stung by nonvenomous insect and other nonvenomous arthropods, sequela: Secondary | ICD-10-CM

## 2017-06-04 LAB — POC URINALSYSI DIPSTICK (AUTOMATED)
Bilirubin, UA: NEGATIVE
Blood, UA: NEGATIVE
Glucose, UA: NEGATIVE
KETONES UA: NEGATIVE
Leukocytes, UA: NEGATIVE
Nitrite, UA: NEGATIVE
PH UA: 6 (ref 5.0–8.0)
PROTEIN UA: NEGATIVE
Urobilinogen, UA: 0.2 E.U./dL

## 2017-06-04 LAB — HEPATIC FUNCTION PANEL
ALBUMIN: 4.4 g/dL (ref 3.5–5.2)
ALT: 79 U/L — AB (ref 0–35)
AST: 79 U/L — AB (ref 0–37)
Alkaline Phosphatase: 40 U/L (ref 39–117)
Bilirubin, Direct: 0.1 mg/dL (ref 0.0–0.3)
Total Bilirubin: 0.6 mg/dL (ref 0.2–1.2)
Total Protein: 7.6 g/dL (ref 6.0–8.3)

## 2017-06-04 NOTE — Progress Notes (Signed)
[facility] [address] [place], [place] [address] [phone]  Date:  06/04/2017   Name:  Mandy Kim   DOB:  Oct 07, 1946   MRN:  [id number]  PCP:  Darreld Mclean, MD    Chief Complaint: Hospitalization Follow-up (Pt here for hosp f/u. )   History of Present Illness:  Mandy Kim is a 71 y.o. very pleasant female patient who presents with the following:  Hospital follow-up today She was admitted from 6/25 to 6/27 with sepsis due to UTI and concurrent URI, see below 1. Sepsis due to urinary tract infection, present on admission Along with rhinovirus URI. She was treated with IV fluids and empiric IV antibiotics, switched to Keflex for 2 more days, blood cultures negative, she is clinically depressed and feeling fine, she currently wants to go home, is not short of breath, no oxygen demand, she has been walking in the room, we'll place her on oral antibiotics and discharge her home with PCP follow-up.   2. Dehydration. resolved after hydration.   3. Type 2 diabetes mellitus. Continue home regimen and follow with PCP.   4. Elevated liver enzymes. Due to viral infection, LFTs trending down, total bili stable, PCP to monitor.   5. High d dimer. Due to infections, negative lower extremity duplex and VQ scan.   6. Fever due to Positive rhinovirus and UTI. Outpatient supportive care. Antibiotics as a #1.  7. VQ scan suggestive of possible undiagnosed COPD. As needed nebulizer treatment. We'll recommend PCP to arrange for one-time outpatient pulmonary follow-up and PFTs.  She is seeing Dr. Loanne Drilling for her DM She had also called me due to headaches and not feeling well-noted equivocal RMSF IgM. I started her on doxy in case of RMSF.  She notes that she is feeling better- no headache or fever at this time  She is seeing Dr. Louis Meckel for her chronic UTI- she had been on chronic abx in the past for  suppression.  This was stopped about a year ago.  She is not quite sure what she was taking in the past ?keflex Her urine culture showed E coli most recently - these sx seem to be resolved but we will repeat her urine culture Notes that she has lost a few pounds during her illness- this seems to have greatly affected her glucose control Her blood sugars have been in the 80s and 90s recently, max 120. She is not taking her lantus right now- had been on up to 80 units recently.  Recent A1c was still high.   Discussed ?COPD on her recent VQ scan.  Pt was never a significant smoker She has never noted any respiratory sx even if she gets vigorous exercise, her CXR have been normal  Doubt if she truly has COPD- will consider doing PFTs once she is fully recovered however if she is willing.  Pt also does not think that she could have COPD  Need to repeat her LFts today Wt Readings from Last 3 Encounters:  06/04/17 169 lb 6.4 oz (76.8 kg)  05/28/17 175 lb (79.4 kg)  05/17/17 177 lb 3.2 oz (80.4 kg)    Lab Results  Component Value Date   HGBA1C 10.5 (H) 05/29/2017   Patient Active Problem List   Diagnosis Date Noted  . Hypoxia   . Sepsis ([place]) 05/28/2017  . Elevated LFTs 05/28/2017  . Acute lower UTI 05/28/2017  . Headache 05/28/2017  .  Dehydration 05/28/2017  . Peripheral neuropathy 07/27/2014  . Other and unspecified hyperlipidemia 02/12/2012  . AAA (abdominal aortic aneurysm) ([place]) 02/12/2012  . Fatty liver 02/12/2012  . Breast cancer ([place]) 02/12/2012  . Cervical cancer ([place]) 02/12/2012  . Diabetes mellitus ([place]) 02/12/2012  . Hyperlipidemia 02/12/2012    Past Medical History:  Diagnosis Date  . Allergy   . Cancer ([place])   . Cataract   . Chronic kidney disease   . Diabetes mellitus   . Diverticulitis   . GERD (gastroesophageal reflux disease)   . Hyperlipidemia   . Sleep apnea    has c pap machine    Past Surgical History:  Procedure Laterality Date  . ABDOMINAL AORTIC  ANEURYSM REPAIR    . ABDOMINAL AORTIC ANEURYSM REPAIR    . ABDOMINAL HYSTERECTOMY  2000  . Bladder tacking    . BREAST SURGERY    . CATARACT EXTRACTION, BILATERAL    . EYE SURGERY    . MASTECTOMY    . TONSILLECTOMY      Social History  Substance Use Topics  . Smoking status: Former Smoker    Years: 30.00    Types: Cigarettes    Quit date: 06/07/1999  . Smokeless tobacco: Never Used  . Alcohol use Not on file    Family History  Problem Relation Age of Onset  . Cancer Mother   . Pneumonia Mother   . Thyroid disease Mother   . Thyroid disease Sister   . Obesity Daughter   . Hypertension Daughter   . Thyroid disease Son   . Pneumonia Maternal Grandmother   . Heart attack Maternal Grandfather   . Diabetes Neg Hx     Allergies  Allergen Reactions  . Aspirin     Per pt any pain medications  . Crestor [Rosuvastatin Calcium]   . Ibuprofen Swelling  . Lipitor [Atorvastatin Calcium]   . Pravastatin   . Simvastatin   . Trulicity [Dulaglutide] Other (See Comments)    flatulance  . Tylenol [Acetaminophen] Swelling    Medication list has been reviewed and updated.  Current Outpatient Prescriptions on File Prior to Visit  Medication Sig Dispense Refill  . Alcohol Swabs PADS Use to test blood sugar 3 times daily. Dx: E11.9 300 each 2  . b complex vitamins tablet Take 1 tablet by mouth daily.    . Biotin 1000 MCG tablet Take 1,000 mcg by mouth daily.    . Blood Glucose Calibration (GLUCOSE CONTROL) SOLN Use to test blood sugar 3 times daily. Dx: E11.9 1 each 2  . Blood Glucose Monitoring Suppl (TRUE METRIX AIR GLUCOSE METER) w/Device KIT USE  TO TEST BLOOD SUGAR THREE TIMES DAILY 1 kit 0  . cholecalciferol (VITAMIN D) 1000 UNITS tablet Take 1,000 Units by mouth daily.    . clopidogrel (PLAVIX) 75 MG tablet TAKE 1 TABLET (75 MG TOTAL) BY MOUTH DAILY. 90 tablet 3  . doxycycline (VIBRAMYCIN) 100 MG capsule Take 1 capsule (100 mg total) by mouth 2 (two) times daily. 20 capsule 0   . gabapentin (NEURONTIN) 100 MG capsule Take 1 capsule (100 mg total) by mouth at bedtime. Can increase to 200 or 300 mg as needed for foot pain 90 capsule 3  . gemfibrozil (LOPID) 600 MG tablet TAKE 1 TABLET (600 MG TOTAL) BY MOUTH 2 (TWO) TIMES DAILY BEFORE A MEAL. 180 tablet 3  . insulin glargine (LANTUS) 100 UNIT/ML injection Inject 0.8 mLs (80 Units total) into the skin at bedtime. (Patient taking differently:  Inject 80 Units into the skin every morning. ) 80 mL 4  . Insulin Syringe-Needle U-100 (INSULIN SYRINGE 1CC/30GX5/16") 30G X 5/16" 1 ML MISC Use to inject insulin 1 time per day. 100 each 2  . ipratropium-albuterol (DUONEB) 0.5-2.5 (3) MG/3ML SOLN Take 3 mLs by nebulization every 4 (four) hours as needed. 360 mL 0  . KLOR-CON M20 20 MEQ tablet TAKE 1 TABLET (20 MEQ TOTAL) BY MOUTH DAILY. 90 tablet 3  . levocetirizine (XYZAL) 5 MG tablet Take 1 tablet (5 mg total) by mouth every evening. (Patient taking differently: Take 5 mg by mouth daily. ) 30 tablet 5  . metFORMIN (GLUCOPHAGE) 1000 MG tablet TAKE 1 TABLET (500 MG TOTAL) BY MOUTH 2 (TWO) TIMES DAILY WITH A MEAL. 180 tablet 1  . Multiple Vitamin (MULTIVITAMIN) tablet Take 0.5 tablets by mouth 2 (two) times daily.     . niacin (NIASPAN) 500 MG CR tablet Take 1 tablet (500 mg total) by mouth at bedtime. 30 tablet 3  . pantoprazole (PROTONIX) 40 MG tablet TAKE 1 TABLET BY MOUTH EVERY DAY ON AN EMPTY STOMACH 90 tablet 3  . TRUE METRIX BLOOD GLUCOSE TEST test strip USE AS DIRECTED TO TEST BLOOD SUGAR THREE TIMES DAILY 1 each 0  . vitamin C (ASCORBIC ACID) 500 MG tablet Take 500 mg by mouth daily.     No current facility-administered medications on file prior to visit.     Review of Systems:  As per HPI- otherwise negative.   Physical Examination: Vitals:   06/04/17 1124  BP: 112/60  Pulse: 65  Temp: 97.9 F (36.6 C)   Vitals:   06/04/17 1124  Weight: 169 lb 6.4 oz (76.8 kg)  Height: 5' 4" (1.626 m)   Body mass index is  29.08 kg/m. Ideal Body Weight: Weight in (lb) to have BMI = 25: 145.3  GEN: WDWN, NAD, Non-toxic, A & O x 3, looks her normal self.  Here with her husband Gwyndolyn Saxon today HEENT: Atraumatic, Normocephalic. Neck supple. No masses, No LAD. Ears and Nose: No external deformity. CV: RRR, No M/G/R. No JVD. No thrill. No extra heart sounds. PULM: CTA B, no wheezes, crackles, rhonchi. No retractions. No resp. distress. No accessory muscle use. ABD: S, NT, ND, +BS. No rebound. No HSM. EXTR: No c/c/e NEURO Normal gait.  PSYCH: Normally interactive. Conversant. Not depressed or anxious appearing.  Calm demeanor.  S/p mastectomy  Results for orders placed or performed in visit on 06/04/17  Hepatic function panel  Result Value Ref Range   Total Bilirubin 0.6 0.2 - 1.2 mg/dL   Bilirubin, Direct 0.1 0.0 - 0.3 mg/dL   Alkaline Phosphatase 40 39 - 117 U/L   AST 79 (H) 0 - 37 U/L   ALT 79 (H) 0 - 35 U/L   Total Protein 7.6 6.0 - 8.3 g/dL   Albumin 4.4 3.5 - 5.2 g/dL  POCT Urinalysis Dipstick (Automated)  Result Value Ref Range   Color, UA yellow    Clarity, UA clear    Glucose, UA negative    Bilirubin, UA negative    Ketones, UA negative    Spec Grav, UA >=1.030 (A) 1.010 - 1.025   Blood, UA negative    pH, UA 6.0 5.0 - 8.0   Protein, UA negative    Urobilinogen, UA 0.2 0.2 or 1.0 E.U./dL   Nitrite, UA negative    Leukocytes, UA Negative Negative    Assessment and Plan: Recurrent UTI - Plan: Urine Culture, POCT  Urinalysis Dipstick (Automated)  Type 2 diabetes mellitus with complication, without long-term current use of insulin (HCC)  Elevated LFTs - Plan: Hepatic function panel  Here today to follow- up from recent admission with urosepsis and respiratory illness She is feeling much better UA is benign await culture We are covering her with doxy due to risk of RMSF.  Fevers are resolved, she will complete course LFTs are trending down Plan to visit in 3 months, sooner if she needs  anything She is seeing Dr. Loanne Drilling in about 2 months.  Suspect that her glucose may come back up, but if she is running under 100 right now agree with holding lantus.     Signed Lamar Blinks, MD

## 2017-06-04 NOTE — Patient Instructions (Addendum)
It was good to see you today- I am glad that you are feeling better! We will repeat your urine culture and liver function today- I will let you know how they look  We can plan to do pulmonary function testing for you in a few months when you are back to full health Finish out your doxycycline- let me know if your fevers return or if you have any other concerns Otherwise please see me in about 3 months

## 2017-06-05 LAB — EHRLICHIA ANTIBODY PANEL
E CHAFFEENSIS AB, IGG: NEGATIVE
E CHAFFEENSIS AB, IGM: NEGATIVE
E. CHAFFEENSIS (HME) IGM TITER: NEGATIVE
E. CHAFFEENSIS IGG AB: NEGATIVE

## 2017-06-06 ENCOUNTER — Encounter: Payer: Self-pay | Admitting: Family Medicine

## 2017-06-06 LAB — URINE CULTURE: Organism ID, Bacteria: NO GROWTH

## 2017-06-11 ENCOUNTER — Inpatient Hospital Stay: Payer: Medicare HMO | Admitting: Family Medicine

## 2017-06-28 ENCOUNTER — Other Ambulatory Visit: Payer: Self-pay | Admitting: Family Medicine

## 2017-06-29 DIAGNOSIS — J449 Chronic obstructive pulmonary disease, unspecified: Secondary | ICD-10-CM | POA: Diagnosis not present

## 2017-06-29 DIAGNOSIS — R0902 Hypoxemia: Secondary | ICD-10-CM | POA: Diagnosis not present

## 2017-07-30 DIAGNOSIS — J449 Chronic obstructive pulmonary disease, unspecified: Secondary | ICD-10-CM | POA: Diagnosis not present

## 2017-07-30 DIAGNOSIS — R0902 Hypoxemia: Secondary | ICD-10-CM | POA: Diagnosis not present

## 2017-08-02 DIAGNOSIS — N3020 Other chronic cystitis without hematuria: Secondary | ICD-10-CM | POA: Diagnosis not present

## 2017-08-07 ENCOUNTER — Encounter: Payer: Self-pay | Admitting: Endocrinology

## 2017-08-07 ENCOUNTER — Ambulatory Visit: Payer: Medicare HMO | Admitting: Endocrinology

## 2017-08-07 ENCOUNTER — Ambulatory Visit (INDEPENDENT_AMBULATORY_CARE_PROVIDER_SITE_OTHER): Payer: Medicare HMO | Admitting: Endocrinology

## 2017-08-07 VITALS — BP 128/70 | HR 65

## 2017-08-07 DIAGNOSIS — E1151 Type 2 diabetes mellitus with diabetic peripheral angiopathy without gangrene: Secondary | ICD-10-CM

## 2017-08-07 DIAGNOSIS — Z794 Long term (current) use of insulin: Secondary | ICD-10-CM

## 2017-08-07 DIAGNOSIS — E0800 Diabetes mellitus due to underlying condition with hyperosmolarity without nonketotic hyperglycemic-hyperosmolar coma (NKHHC): Secondary | ICD-10-CM

## 2017-08-07 LAB — POCT GLYCOSYLATED HEMOGLOBIN (HGB A1C): HEMOGLOBIN A1C: 6.5

## 2017-08-07 MED ORDER — INSULIN GLARGINE 100 UNIT/ML ~~LOC~~ SOLN: 70.0000 [IU] | SUBCUTANEOUS | 11 refills | Status: DC

## 2017-08-07 NOTE — Patient Instructions (Signed)
Please reduce the lantus to 70 units each morning. check your blood sugar twice a day.  vary the time of day when you check, between before the 3 meals, and at bedtime.  also check if you have symptoms of your blood sugar being too high or too low.  please keep a record of the readings and bring it to your next appointment here (or you can bring the meter itself).  You can write it on any piece of paper.  please call us sooner if your blood sugar goes below 70, or if you have a lot of readings over 200. Please come back for a follow-up appointment in 2 months.

## 2017-08-07 NOTE — Progress Notes (Signed)
Subjective:    Patient ID: Mandy Kim, female    DOB: 19-Sep-1946, 71 y.o.   MRN: [id number]  HPI Pt returns for f/u of diabetes mellitus: DM type: Insulin-requiring type 2.  Dx'ed: 4765 Complications: PAD Therapy: insulin since 2016 GDM: never DKA: never Severe hypoglycemia: never Pancreatitis: never Pancreatic imaging: never Other: she takes qd insulin, at least for now.  Interval history: she brings a record of her cbg's which I have reviewed today.  It varies from 84-100's.  There is no trend throughout the day.   Past Medical History:  Diagnosis Date  . Allergy   . Cancer ([place])   . Cataract   . Chronic kidney disease   . Diabetes mellitus   . Diverticulitis   . GERD (gastroesophageal reflux disease)   . Hyperlipidemia   . Sleep apnea    has c pap machine    Past Surgical History:  Procedure Laterality Date  . ABDOMINAL AORTIC ANEURYSM REPAIR    . ABDOMINAL AORTIC ANEURYSM REPAIR    . ABDOMINAL HYSTERECTOMY  2000  . Bladder tacking    . BREAST SURGERY    . CATARACT EXTRACTION, BILATERAL    . EYE SURGERY    . MASTECTOMY    . TONSILLECTOMY      Social History   Social History  . Marital status: Married    Spouse name: N/A  . Number of children: N/A  . Years of education: N/A   Occupational History  . Not on file.   Social History Main Topics  . Smoking status: Former Smoker    Years: 30.00    Types: Cigarettes    Quit date: 06/07/1999  . Smokeless tobacco: Never Used  . Alcohol use Not on file  . Drug use: Unknown  . Sexual activity: Yes   Other Topics Concern  . Not on file   Social History Narrative   Lives with husband in [place].   Occupation: truck Geophysicist/field seismologist.    Current Outpatient Prescriptions on File Prior to Visit  Medication Sig Dispense Refill  . Alcohol Swabs PADS Use to test blood sugar 3 times daily. Dx: E11.9 300 each 2  . b complex vitamins tablet Take 1 tablet by mouth daily.    . Biotin 1000 MCG tablet Take 1,000 mcg  by mouth daily.    . Blood Glucose Calibration (GLUCOSE CONTROL) SOLN Use to test blood sugar 3 times daily. Dx: E11.9 1 each 2  . Blood Glucose Monitoring Suppl (TRUE METRIX AIR GLUCOSE METER) w/Device KIT USE  TO TEST BLOOD SUGAR THREE TIMES DAILY 1 kit 0  . cholecalciferol (VITAMIN D) 1000 UNITS tablet Take 1,000 Units by mouth daily.    . clopidogrel (PLAVIX) 75 MG tablet TAKE 1 TABLET (75 MG TOTAL) BY MOUTH DAILY. 90 tablet 3  . doxycycline (VIBRAMYCIN) 100 MG capsule Take 1 capsule (100 mg total) by mouth 2 (two) times daily. 20 capsule 0  . gabapentin (NEURONTIN) 100 MG capsule Take 1 capsule (100 mg total) by mouth at bedtime. Can increase to 200 or 300 mg as needed for foot pain 90 capsule 3  . gemfibrozil (LOPID) 600 MG tablet TAKE 1 TABLET (600 MG TOTAL) BY MOUTH 2 (TWO) TIMES DAILY BEFORE A MEAL. 180 tablet 3  . Insulin Syringe-Needle U-100 (INSULIN SYRINGE 1CC/30GX5/16") 30G X 5/16" 1 ML MISC Use to inject insulin 1 time per day. 100 each 2  . ipratropium-albuterol (DUONEB) 0.5-2.5 (3) MG/3ML SOLN Take 3 mLs by nebulization every  4 (four) hours as needed. 360 mL 0  . KLOR-CON M20 20 MEQ tablet TAKE 1 TABLET (20 MEQ TOTAL) BY MOUTH DAILY. 90 tablet 3  . levocetirizine (XYZAL) 5 MG tablet Take 1 tablet (5 mg total) by mouth every evening. (Patient taking differently: Take 5 mg by mouth daily. ) 30 tablet 5  . metFORMIN (GLUCOPHAGE) 1000 MG tablet TAKE 1 TABLET (500 MG TOTAL) BY MOUTH 2 (TWO) TIMES DAILY WITH A MEAL. 180 tablet 1  . Multiple Vitamin (MULTIVITAMIN) tablet Take 0.5 tablets by mouth 2 (two) times daily.     . niacin (NIASPAN) 500 MG CR tablet Take 1 tablet (500 mg total) by mouth at bedtime. 30 tablet 3  . pantoprazole (PROTONIX) 40 MG tablet TAKE 1 TABLET BY MOUTH EVERY DAY ON AN EMPTY STOMACH 90 tablet 3  . TRUE METRIX BLOOD GLUCOSE TEST test strip CHECK BLOOD SUGAR THREE TIMES DAILY 300 each 12  . vitamin C (ASCORBIC ACID) 500 MG tablet Take 500 mg by mouth daily.     No  current facility-administered medications on file prior to visit.     Allergies  Allergen Reactions  . Aspirin     Per pt any pain medications  . Crestor [Rosuvastatin Calcium]   . Ibuprofen Swelling  . Lipitor [Atorvastatin Calcium]   . Pravastatin   . Simvastatin   . Trulicity [Dulaglutide] Other (See Comments)    flatulance  . Tylenol [Acetaminophen] Swelling    Family History  Problem Relation Age of Onset  . Cancer Mother   . Pneumonia Mother   . Thyroid disease Mother   . Thyroid disease Sister   . Obesity Daughter   . Hypertension Daughter   . Thyroid disease Son   . Pneumonia Maternal Grandmother   . Heart attack Maternal Grandfather   . Diabetes Neg Hx     BP 128/70   Pulse 65   SpO2 95%    Review of Systems She denies hypoglycemia.     Objective:   Physical Exam VITAL SIGNS:  See vs page GENERAL: no distress Pulses: foot pulses are intact bilaterally.   MSK: no deformity of the feet or ankles.  CV: no edema of the legs or ankles Skin:  no ulcer on the feet or ankles.  normal color and temp on the feet and ankles Neuro: sensation is intact to touch on the feet and ankles.   Ext: There is bilateral onychomycosis of the toenails.    Lab Results  Component Value Date   HGBA1C 6.5 08/07/2017      Assessment & Plan:  Insulin-requiring type 2 DM, with PAD: overcontrolled, given this regimen, which does match insulin to her changing needs throughout the day  Patient Instructions  Please reduce the lantus to 70 units each morning. check your blood sugar twice a day.  vary the time of day when you check, between before the 3 meals, and at bedtime.  also check if you have symptoms of your blood sugar being too high or too low.  please keep a record of the readings and bring it to your next appointment here (or you can bring the meter itself).  You can write it on any piece of paper.  please call us sooner if your blood sugar goes below 70, or if you have a  lot of readings over 200. Please come back for a follow-up appointment in 2 months.

## 2017-08-08 DIAGNOSIS — S60561A Insect bite (nonvenomous) of right hand, initial encounter: Secondary | ICD-10-CM | POA: Diagnosis not present

## 2017-08-11 ENCOUNTER — Other Ambulatory Visit: Payer: Self-pay | Admitting: Family Medicine

## 2017-08-11 DIAGNOSIS — K219 Gastro-esophageal reflux disease without esophagitis: Secondary | ICD-10-CM

## 2017-08-26 ENCOUNTER — Other Ambulatory Visit: Payer: Self-pay | Admitting: Family Medicine

## 2017-08-26 DIAGNOSIS — E118 Type 2 diabetes mellitus with unspecified complications: Secondary | ICD-10-CM

## 2017-08-30 DIAGNOSIS — R0902 Hypoxemia: Secondary | ICD-10-CM | POA: Diagnosis not present

## 2017-08-30 DIAGNOSIS — J449 Chronic obstructive pulmonary disease, unspecified: Secondary | ICD-10-CM | POA: Diagnosis not present

## 2017-09-02 NOTE — Progress Notes (Deleted)
[place] [facility] [address] [place], [place] [address] [phone]  Date:  09/03/2017   Name:  Mandy Kim   DOB:  1946/04/17   MRN:  [id number]  PCP:  Darreld Mclean, MD    Chief Complaint: No chief complaint on file.   History of Present Illness:  Mandy Kim is a 71 y.o. very pleasant female patient who presents with the following:  Here today for a follow-up visit I last saw her in July, as follows:  Hospital follow-up today She was admitted from 6/25 to 6/27 with sepsis due to UTI and concurrent URI, see below 1. Sepsis due to urinary tract infection, present on admission Along with rhinovirus URI. She was treated with IV fluids and empiric IV antibiotics, switched to Keflex for 2 more days, blood cultures negative, she is clinically depressed and feeling fine, she currently wants to go home, is not short of breath, no oxygen demand, she has been walking in the room, we'll place her on oral antibiotics and discharge her home with PCP follow-up.   2. Dehydration.resolved after hydration.   3. Type 2 diabetes mellitus. Continue home regimen and follow with PCP.   4. Elevated liver enzymes. Due to viral infection, LFTs trending down, total bili stable, PCP to monitor.   5. High d dimer. Due to infections, negative lower extremity duplex and VQ scan.   6. Fever due to Positive rhinovirusand UTI. Outpatient supportive care. Antibiotics as a #1.  7.VQ scan suggestive of possible undiagnosed COPD.As needed nebulizer treatment. We'll recommend PCP to arrange for one-time outpatient pulmonary follow-up and PFTs.  She is seeing Dr. Loanne Drilling for her DM She had also called me due to headaches and not feeling well-noted equivocal RMSF IgM. I started her on doxy in case of RMSF.  She notes that she is feeling better- no headache or fever at this time  She is seeing Dr. Louis Meckel for her chronic UTI-  she had been on chronic abx in the past for suppression.  This was stopped about a year ago.  She is not quite sure what she was taking in the past ?keflex Her urine culture showed E coli most recently - these sx seem to be resolved but we will repeat her urine culture Notes that she has lost a few pounds during her illness- this seems to have greatly affected her glucose control Her blood sugars have been in the 80s and 90s recently, max 120. She is not taking her lantus right now- had been on up to 80 units recently.  Recent A1c was still high.   Discussed ?COPD on her recent VQ scan.  Pt was never a significant smoker She has never noted any respiratory sx even if she gets vigorous exercise, her CXR have been normal  Doubt if she truly has COPD- will consider doing PFTs once she is fully recovered however if she is willing.  Pt also does not think that she could have COPD  Need to repeat her LFts today    Wt Readings from Last 3 Encounters:  06/04/17 169 lb 6.4 oz (76.8 kg)  05/28/17 175 lb (79.4 kg)  05/17/17 177 lb 3.2 oz (80.4 kg)   She also saw Dr. Loanne Drilling recently for a DM follow-up visit. Her A1c looks great, they have really improved her control which I certainly appreciate.  Flu shot: Urine microalbumin is due soon- can do today if  she likes Lab Results  Component Value Date   HGBA1C 6.5 08/07/2017     Patient Active Problem List   Diagnosis Date Noted  . Hypoxia   . Sepsis ([place]) 05/28/2017  . Elevated LFTs 05/28/2017  . Acute lower UTI 05/28/2017  . Headache 05/28/2017  . Dehydration 05/28/2017  . Peripheral neuropathy 07/27/2014  . Other and unspecified hyperlipidemia 02/12/2012  . AAA (abdominal aortic aneurysm) ([place]) 02/12/2012  . Fatty liver 02/12/2012  . Breast cancer ([place]) 02/12/2012  . Cervical cancer ([place]) 02/12/2012  . Diabetes mellitus ([place]) 02/12/2012  . Hyperlipidemia 02/12/2012    Past Medical History:  Diagnosis Date  . Allergy   . Cancer  ([place])   . Cataract   . Chronic kidney disease   . Diabetes mellitus   . Diverticulitis   . GERD (gastroesophageal reflux disease)   . Hyperlipidemia   . Sleep apnea    has c pap machine    Past Surgical History:  Procedure Laterality Date  . ABDOMINAL AORTIC ANEURYSM REPAIR    . ABDOMINAL AORTIC ANEURYSM REPAIR    . ABDOMINAL HYSTERECTOMY  2000  . Bladder tacking    . BREAST SURGERY    . CATARACT EXTRACTION, BILATERAL    . EYE SURGERY    . MASTECTOMY    . TONSILLECTOMY      Social History  Substance Use Topics  . Smoking status: Former Smoker    Years: 30.00    Types: Cigarettes    Quit date: 06/07/1999  . Smokeless tobacco: Never Used  . Alcohol use Not on file    Family History  Problem Relation Age of Onset  . Cancer Mother   . Pneumonia Mother   . Thyroid disease Mother   . Thyroid disease Sister   . Obesity Daughter   . Hypertension Daughter   . Thyroid disease Son   . Pneumonia Maternal Grandmother   . Heart attack Maternal Grandfather   . Diabetes Neg Hx     Allergies  Allergen Reactions  . Aspirin     Per pt any pain medications  . Crestor [Rosuvastatin Calcium]   . Ibuprofen Swelling  . Lipitor [Atorvastatin Calcium]   . Pravastatin   . Simvastatin   . Trulicity [Dulaglutide] Other (See Comments)    flatulance  . Tylenol [Acetaminophen] Swelling    Medication list has been reviewed and updated.  Current Outpatient Prescriptions on File Prior to Visit  Medication Sig Dispense Refill  . Alcohol Swabs PADS Use to test blood sugar 3 times daily. Dx: E11.9 300 each 2  . b complex vitamins tablet Take 1 tablet by mouth daily.    . Biotin 1000 MCG tablet Take 1,000 mcg by mouth daily.    . Blood Glucose Calibration (GLUCOSE CONTROL) SOLN Use to test blood sugar 3 times daily. Dx: E11.9 1 each 2  . Blood Glucose Monitoring Suppl (TRUE METRIX AIR GLUCOSE METER) w/Device KIT USE  TO TEST BLOOD SUGAR THREE TIMES DAILY 1 kit 0  . cholecalciferol  (VITAMIN D) 1000 UNITS tablet Take 1,000 Units by mouth daily.    . clopidogrel (PLAVIX) 75 MG tablet TAKE 1 TABLET (75 MG TOTAL) BY MOUTH DAILY. 90 tablet 3  . doxycycline (VIBRAMYCIN) 100 MG capsule Take 1 capsule (100 mg total) by mouth 2 (two) times daily. 20 capsule 0  . gabapentin (NEURONTIN) 100 MG capsule Take 1 capsule (100 mg total) by mouth at bedtime. Can increase to 200 or 300 mg as needed for  foot pain 90 capsule 3  . gemfibrozil (LOPID) 600 MG tablet TAKE 1 TABLET (600 MG TOTAL) BY MOUTH 2 (TWO) TIMES DAILY BEFORE A MEAL. 180 tablet 3  . insulin glargine (LANTUS) 100 UNIT/ML injection Inject 0.7 mLs (70 Units total) into the skin every morning. And syringes 1/day 20 mL 11  . Insulin Syringe-Needle U-100 (INSULIN SYRINGE 1CC/30GX5/16") 30G X 5/16" 1 ML MISC Use to inject insulin 1 time per day. 100 each 2  . ipratropium-albuterol (DUONEB) 0.5-2.5 (3) MG/3ML SOLN Take 3 mLs by nebulization every 4 (four) hours as needed. 360 mL 0  . KLOR-CON M20 20 MEQ tablet TAKE 1 TABLET (20 MEQ TOTAL) BY MOUTH DAILY. 90 tablet 3  . levocetirizine (XYZAL) 5 MG tablet Take 1 tablet (5 mg total) by mouth every evening. (Patient taking differently: Take 5 mg by mouth daily. ) 30 tablet 5  . metFORMIN (GLUCOPHAGE) 1000 MG tablet TAKE 1 TABLET (500 MG TOTAL) BY MOUTH 2 (TWO) TIMES DAILY WITH A MEAL. 180 tablet 1  . Multiple Vitamin (MULTIVITAMIN) tablet Take 0.5 tablets by mouth 2 (two) times daily.     . niacin (NIASPAN) 500 MG CR tablet Take 1 tablet (500 mg total) by mouth at bedtime. 30 tablet 3  . pantoprazole (PROTONIX) 40 MG tablet TAKE 1 TABLET BY MOUTH EVERY DAY ON AN EMPTY STOMACH 90 tablet 1  . TRUE METRIX BLOOD GLUCOSE TEST test strip CHECK BLOOD SUGAR THREE TIMES DAILY 300 each 12  . vitamin C (ASCORBIC ACID) 500 MG tablet Take 500 mg by mouth daily.     No current facility-administered medications on file prior to visit.     Review of Systems:  ***  Physical Examination: There were no  vitals filed for this visit. There were no vitals filed for this visit. There is no height or weight on file to calculate BMI. Ideal Body Weight:    ***  Assessment and Plan: ***  Signed Lamar Blinks, MD

## 2017-09-03 ENCOUNTER — Telehealth: Payer: Self-pay | Admitting: Family Medicine

## 2017-09-03 ENCOUNTER — Ambulatory Visit: Payer: Medicare HMO | Admitting: Family Medicine

## 2017-09-03 NOTE — Telephone Encounter (Signed)
Pt called in at 10:37 to reschedule apt for today, pt says that she had an emergency to come up. Pt is rescheduled for Thursday at 1:15p

## 2017-09-03 NOTE — Telephone Encounter (Signed)
Ok- no charge.  Thank you

## 2017-09-06 ENCOUNTER — Ambulatory Visit (INDEPENDENT_AMBULATORY_CARE_PROVIDER_SITE_OTHER): Payer: Medicare HMO | Admitting: Family Medicine

## 2017-09-06 VITALS — BP 118/72 | HR 65 | Temp 98.0°F | Ht 64.0 in | Wt 177.0 lb

## 2017-09-06 DIAGNOSIS — E785 Hyperlipidemia, unspecified: Secondary | ICD-10-CM | POA: Diagnosis not present

## 2017-09-06 DIAGNOSIS — R7989 Other specified abnormal findings of blood chemistry: Secondary | ICD-10-CM

## 2017-09-06 DIAGNOSIS — IMO0001 Reserved for inherently not codable concepts without codable children: Secondary | ICD-10-CM

## 2017-09-06 DIAGNOSIS — Z794 Long term (current) use of insulin: Secondary | ICD-10-CM | POA: Diagnosis not present

## 2017-09-06 DIAGNOSIS — R635 Abnormal weight gain: Secondary | ICD-10-CM | POA: Diagnosis not present

## 2017-09-06 DIAGNOSIS — R945 Abnormal results of liver function studies: Secondary | ICD-10-CM | POA: Diagnosis not present

## 2017-09-06 DIAGNOSIS — E1165 Type 2 diabetes mellitus with hyperglycemia: Secondary | ICD-10-CM | POA: Diagnosis not present

## 2017-09-06 LAB — COMPREHENSIVE METABOLIC PANEL
ALK PHOS: 34 U/L — AB (ref 39–117)
ALT: 38 U/L — ABNORMAL HIGH (ref 0–35)
AST: 31 U/L (ref 0–37)
Albumin: 4.7 g/dL (ref 3.5–5.2)
BUN: 15 mg/dL (ref 6–23)
CO2: 26 meq/L (ref 19–32)
Calcium: 9.7 mg/dL (ref 8.4–10.5)
Chloride: 102 mEq/L (ref 96–112)
Creatinine, Ser: 0.72 mg/dL (ref 0.40–1.20)
GFR: 84.42 mL/min (ref >60.00)
GLUCOSE: 100 mg/dL — AB (ref 70–99)
POTASSIUM: 4.2 meq/L (ref 3.5–5.1)
Sodium: 138 mEq/L (ref 135–145)
Total Bilirubin: 0.5 mg/dL (ref 0.2–1.2)
Total Protein: 7.8 g/dL (ref 6.0–8.3)

## 2017-09-06 LAB — LIPID PANEL
Cholesterol: 202 mg/dL — ABNORMAL HIGH (ref 0–200)
HDL: 28.6 mg/dL — AB (ref >39.00)
NonHDL: 173.2
Total CHOL/HDL Ratio: 7
Triglycerides: 265 mg/dL — ABNORMAL HIGH (ref 0.0–149.0)
VLDL: 53 mg/dL — AB (ref 0.0–40.0)

## 2017-09-06 LAB — LDL CHOLESTEROL, DIRECT: LDL DIRECT: 138 mg/dL

## 2017-09-06 NOTE — Progress Notes (Addendum)
[place] at [facility] [address], [place], [place] [address] [phone]  Date:  09/06/2017   Name:  Mandy Kim   DOB:  1946-02-09   MRN:  [id number]  PCP:  Darreld Mclean, MD    Chief Complaint: Follow-up (Pt here for f/u visit. )   History of Present Illness:  Mandy Kim is a 71 y.o. very pleasant female patient who presents with the following:  Needs flu shot and urine microalbumin this month Her most recent A1c looked great- she is now seeing Dr. Loanne Drilling for her DM  Lab Results  Component Value Date   HGBA1C 6.5 08/07/2017   He actually wanted to reduce her insulin a bit as her A1c is under 7% Declines a flu shot today  Will check a fasting lipid today as she did not eat anything so far today. She is taking gemfibrozil, niaspan- she has not been able to tolerate a statin She does have a nebulizer machine, but states that she never needs to use and it does not have any respiratory symptoms ever  She notes that she has gained a few pounds- has not been staying on her diet all that well as far as calories. She tends to eat more fat and not so much carbs   Wt Readings from Last 3 Encounters:  09/06/17 177 lb (80.3 kg)  06/04/17 169 lb 6.4 oz (76.8 kg)  05/28/17 175 lb (79.4 kg)     Patient Active Problem List   Diagnosis Date Noted  . Hypoxia   . Sepsis ([place]) 05/28/2017  . Elevated LFTs 05/28/2017  . Acute lower UTI 05/28/2017  . Headache 05/28/2017  . Dehydration 05/28/2017  . Peripheral neuropathy 07/27/2014  . Other and unspecified hyperlipidemia 02/12/2012  . AAA (abdominal aortic aneurysm) ([place]) 02/12/2012  . Fatty liver 02/12/2012  . Breast cancer ([place]) 02/12/2012  . Cervical cancer ([place]) 02/12/2012  . Diabetes mellitus ([place]) 02/12/2012  . Hyperlipidemia 02/12/2012    Past Medical History:  Diagnosis Date  . Allergy   . Cancer ([place])   . Cataract   . Chronic kidney disease   .  Diabetes mellitus   . Diverticulitis   . GERD (gastroesophageal reflux disease)   . Hyperlipidemia   . Sleep apnea    has c pap machine    Past Surgical History:  Procedure Laterality Date  . ABDOMINAL AORTIC ANEURYSM REPAIR    . ABDOMINAL AORTIC ANEURYSM REPAIR    . ABDOMINAL HYSTERECTOMY  2000  . Bladder tacking    . BREAST SURGERY    . CATARACT EXTRACTION, BILATERAL    . EYE SURGERY    . MASTECTOMY    . TONSILLECTOMY      Social History  Substance Use Topics  . Smoking status: Former Smoker    Years: 30.00    Types: Cigarettes    Quit date: 06/07/1999  . Smokeless tobacco: Never Used  . Alcohol use Not on file    Family History  Problem Relation Age of Onset  . Cancer Mother   . Pneumonia Mother   . Thyroid disease Mother   . Thyroid disease Sister   . Obesity Daughter   . Hypertension Daughter   . Thyroid disease Son   . Pneumonia Maternal Grandmother   . Heart attack Maternal Grandfather   . Diabetes Neg Hx     Allergies  Allergen Reactions  . Aspirin  Per pt any pain medications  . Bee Venom Swelling  . Crestor [Rosuvastatin Calcium]   . Ibuprofen Swelling  . Lipitor [Atorvastatin Calcium]   . Poison Oak Extract Itching and Swelling  . Pravastatin   . Simvastatin   . Trulicity [Dulaglutide] Other (See Comments)    flatulance  . Tylenol [Acetaminophen] Swelling    Medication list has been reviewed and updated.  Current Outpatient Prescriptions on File Prior to Visit  Medication Sig Dispense Refill  . Alcohol Swabs PADS Use to test blood sugar 3 times daily. Dx: E11.9 300 each 2  . b complex vitamins tablet Take 1 tablet by mouth daily.    . Biotin 1000 MCG tablet Take 1,000 mcg by mouth daily.    . Blood Glucose Calibration (GLUCOSE CONTROL) SOLN Use to test blood sugar 3 times daily. Dx: E11.9 1 each 2  . Blood Glucose Monitoring Suppl (TRUE METRIX AIR GLUCOSE METER) w/Device KIT USE  TO TEST BLOOD SUGAR THREE TIMES DAILY 1 kit 0  .  cholecalciferol (VITAMIN D) 1000 UNITS tablet Take 1,000 Units by mouth daily.    . clopidogrel (PLAVIX) 75 MG tablet TAKE 1 TABLET (75 MG TOTAL) BY MOUTH DAILY. 90 tablet 3  . doxycycline (VIBRAMYCIN) 100 MG capsule Take 1 capsule (100 mg total) by mouth 2 (two) times daily. 20 capsule 0  . gabapentin (NEURONTIN) 100 MG capsule Take 1 capsule (100 mg total) by mouth at bedtime. Can increase to 200 or 300 mg as needed for foot pain 90 capsule 3  . gemfibrozil (LOPID) 600 MG tablet TAKE 1 TABLET (600 MG TOTAL) BY MOUTH 2 (TWO) TIMES DAILY BEFORE A MEAL. 180 tablet 3  . insulin glargine (LANTUS) 100 UNIT/ML injection Inject 0.7 mLs (70 Units total) into the skin every morning. And syringes 1/day 20 mL 11  . Insulin Syringe-Needle U-100 (INSULIN SYRINGE 1CC/30GX5/16") 30G X 5/16" 1 ML MISC Use to inject insulin 1 time per day. 100 each 2  . ipratropium-albuterol (DUONEB) 0.5-2.5 (3) MG/3ML SOLN Take 3 mLs by nebulization every 4 (four) hours as needed. 360 mL 0  . KLOR-CON M20 20 MEQ tablet TAKE 1 TABLET (20 MEQ TOTAL) BY MOUTH DAILY. 90 tablet 3  . levocetirizine (XYZAL) 5 MG tablet Take 1 tablet (5 mg total) by mouth every evening. (Patient taking differently: Take 5 mg by mouth daily. ) 30 tablet 5  . metFORMIN (GLUCOPHAGE) 1000 MG tablet TAKE 1 TABLET (500 MG TOTAL) BY MOUTH 2 (TWO) TIMES DAILY WITH A MEAL. 180 tablet 1  . Multiple Vitamin (MULTIVITAMIN) tablet Take 0.5 tablets by mouth 2 (two) times daily.     . niacin (NIASPAN) 500 MG CR tablet Take 1 tablet (500 mg total) by mouth at bedtime. 30 tablet 3  . pantoprazole (PROTONIX) 40 MG tablet TAKE 1 TABLET BY MOUTH EVERY DAY ON AN EMPTY STOMACH 90 tablet 1  . TRUE METRIX BLOOD GLUCOSE TEST test strip CHECK BLOOD SUGAR THREE TIMES DAILY 300 each 12  . vitamin C (ASCORBIC ACID) 500 MG tablet Take 500 mg by mouth daily.     No current facility-administered medications on file prior to visit.     Review of Systems:  As per HPI- otherwise  negative. No CP or SOB No rash No fever or chills  Physical Examination: Vitals:   09/06/17 1325  BP: 118/72  Pulse: 65  Temp: 98 F (36.7 C)  SpO2: 96%   Vitals:   09/06/17 1325  Weight: 177 lb (80.3  kg)  Height: 5' 4"  (1.626 m)   Body mass index is 30.38 kg/m. Ideal Body Weight: Weight in (lb) to have BMI = 25: 145.3  GEN: WDWN, NAD, Non-toxic, A & O x 3 HEENT: Atraumatic, Normocephalic. Neck supple. No masses, No LAD. Ears and Nose: No external deformity. CV: RRR, No M/G/R. No JVD. No thrill. No extra heart sounds. PULM: CTA B, no wheezes, crackles, rhonchi. No retractions. No resp. distress. No accessory muscle use. ABD: S, NT, ND, +BS. No rebound. No HSM. EXTR: No c/c/e NEURO Normal gait.  PSYCH: Normally interactive. Conversant. Not depressed or anxious appearing.  Calm demeanor.    Assessment and Plan: Dyslipidemia - Plan: Lipid panel  Elevated liver function tests - Plan: Comprehensive metabolic panel  Uncontrolled type 2 diabetes mellitus without complication, with long-term current use of insulin (HCC) - Plan: Comprehensive metabolic panel, Microalbumin / creatinine urine ratio  Weight gain  Labs pending as above today- will be in touch with her Declines her flu shot today DM is under good control- will see how her lipids look this time  Signed Lamar Blinks, MD  Received her labs, gave her a call 10/14[facility] Cholesterol is still not at goal- would like to add lovaza to her regimen.  Will send this to her pharm. Otherwise labs look good- will send her a copy, please see me in 4 months to recheck  Results for orders placed or performed in visit on 09/06/17  Comprehensive metabolic panel  Result Value Ref Range   Sodium 138 135 - 145 mEq/L   Potassium 4.2 3.5 - 5.1 mEq/L   Chloride 102 96 - 112 mEq/L   CO2 26 19 - 32 mEq/L   Glucose, Bld 100 (H) 70 - 99 mg/dL   BUN 15 6 - 23 mg/dL   Creatinine, Ser 0.72 0.40 - 1.20 mg/dL   Total Bilirubin 0.5  0.2 - 1.2 mg/dL   Alkaline Phosphatase 34 (L) 39 - 117 U/L   AST 31 0 - 37 U/L   ALT 38 (H) 0 - 35 U/L   Total Protein 7.8 6.0 - 8.3 g/dL   Albumin 4.7 3.5 - 5.2 g/dL   Calcium 9.7 8.4 - 10.5 mg/dL   GFR 84.42 >60.00 mL/min  Lipid panel  Result Value Ref Range   Cholesterol 202 (H) 0 - 200 mg/dL   Triglycerides 265.0 (H) 0.0 - 149.0 mg/dL   HDL 28.60 (L) >39.00 mg/dL   VLDL 53.0 (H) 0.0 - 40.0 mg/dL   Total CHOL/HDL Ratio 7    NonHDL 173.20   Microalbumin / creatinine urine ratio  Result Value Ref Range   Microalb, Ur 0.9 0.0 - 1.9 mg/dL   Creatinine,U 85.2 mg/dL   Microalb Creat Ratio 1.1 0.0 - 30.0 mg/g  LDL cholesterol, direct  Result Value Ref Range   Direct LDL 138.0 mg/dL

## 2017-09-06 NOTE — Patient Instructions (Addendum)
Great to see you again today! I'll be in touch with your labs, and we can plan to visit in 4-6 months

## 2017-09-07 LAB — MICROALBUMIN / CREATININE URINE RATIO
CREATININE, U: 85.2 mg/dL
MICROALB UR: 0.9 mg/dL (ref 0.0–1.9)
MICROALB/CREAT RATIO: 1.1 mg/g (ref 0.0–30.0)

## 2017-09-16 ENCOUNTER — Encounter: Payer: Self-pay | Admitting: Family Medicine

## 2017-09-16 MED ORDER — OMEGA-3-ACID ETHYL ESTERS 1 G PO CAPS: 2.0000 g | ORAL_CAPSULE | Freq: Two times a day (BID) | ORAL | 6 refills | Status: DC

## 2017-09-16 NOTE — Addendum Note (Signed)
Addended by: Lamar Blinks C on: 09/16/2017 12:03 PM   Modules accepted: Orders

## 2017-09-27 DIAGNOSIS — Z48812 Encounter for surgical aftercare following surgery on the circulatory system: Secondary | ICD-10-CM | POA: Diagnosis not present

## 2017-09-27 DIAGNOSIS — I714 Abdominal aortic aneurysm, without rupture: Secondary | ICD-10-CM | POA: Diagnosis not present

## 2017-10-01 ENCOUNTER — Telehealth: Payer: Self-pay | Admitting: Family Medicine

## 2017-10-01 NOTE — Telephone Encounter (Signed)
Patient declined to have fish oil called in for her but she is now questioning if you were calling in a special kind of Fish oil, if so she would like to have that called in to [facility]

## 2017-10-04 NOTE — Telephone Encounter (Signed)
Called the pharmacy and asked them to fill the Lovaza again. Called pt and let her know

## 2017-10-08 ENCOUNTER — Encounter: Payer: Self-pay | Admitting: Endocrinology

## 2017-10-08 ENCOUNTER — Other Ambulatory Visit: Payer: Self-pay

## 2017-10-08 ENCOUNTER — Ambulatory Visit: Payer: Medicare HMO | Admitting: Endocrinology

## 2017-10-08 VITALS — BP 142/62 | HR 72 | Wt 185.2 lb

## 2017-10-08 DIAGNOSIS — E0800 Diabetes mellitus due to underlying condition with hyperosmolarity without nonketotic hyperglycemic-hyperosmolar coma (NKHHC): Secondary | ICD-10-CM

## 2017-10-08 DIAGNOSIS — E119 Type 2 diabetes mellitus without complications: Secondary | ICD-10-CM

## 2017-10-08 LAB — POCT GLYCOSYLATED HEMOGLOBIN (HGB A1C): HEMOGLOBIN A1C: 7

## 2017-10-08 MED ORDER — INSULIN PEN NEEDLE 32G X 4 MM MISC: 4 refills | Status: DC

## 2017-10-08 MED ORDER — INSULIN GLARGINE 100 UNIT/ML ~~LOC~~ SOLN
60.0000 [IU] | SUBCUTANEOUS | 11 refills | Status: DC
Start: 2017-10-08 — End: 2018-05-17

## 2017-10-08 NOTE — Patient Instructions (Addendum)
Please reduce the lantus to 60 units each morning. check your blood sugar twice a day.  vary the time of day when you check, between before the 3 meals, and at bedtime.  also check if you have symptoms of your blood sugar being too high or too low.  please keep a record of the readings and bring it to your next appointment here (or you can bring the meter itself).  You can write it on any piece of paper.  please call us sooner if your blood sugar goes below 70, or if you have a lot of readings over 200. Please come back for a follow-up appointment in 3 months.

## 2017-10-08 NOTE — Progress Notes (Signed)
Subjective:    Patient ID: Mandy Kim, female    DOB: 1946-06-01, 71 y.o.   MRN: [id number]  HPI Pt returns for f/u of diabetes mellitus: DM type: Insulin-requiring type 2.  Dx'ed: 3536 Complications: PAD Therapy: insulin since 2016 GDM: never DKA: never Severe hypoglycemia: never Pancreatitis: never Pancreatic imaging: never Other: she takes qd insulin, at least for now.  Interval history: she brings a record of her cbg's which I have reviewed today.  It varies from 84-100's.  There is no trend throughout the day.   Past Medical History:  Diagnosis Date  . Allergy   . Cancer ([place])   . Cataract   . Chronic kidney disease   . Diabetes mellitus   . Diverticulitis   . GERD (gastroesophageal reflux disease)   . Hyperlipidemia   . Sleep apnea    has c pap machine    Past Surgical History:  Procedure Laterality Date  . ABDOMINAL AORTIC ANEURYSM REPAIR    . ABDOMINAL AORTIC ANEURYSM REPAIR    . ABDOMINAL HYSTERECTOMY  2000  . Bladder tacking    . BREAST SURGERY    . CATARACT EXTRACTION, BILATERAL    . EYE SURGERY    . MASTECTOMY    . TONSILLECTOMY      Social History   Socioeconomic History  . Marital status: Married    Spouse name: Not on file  . Number of children: Not on file  . Years of education: Not on file  . Highest education level: Not on file  Social Needs  . Financial resource strain: Not on file  . Food insecurity - worry: Not on file  . Food insecurity - inability: Not on file  . Transportation needs - medical: Not on file  . Transportation needs - non-medical: Not on file  Occupational History  . Not on file  Tobacco Use  . Smoking status: Former Smoker    Years: 30.00    Types: Cigarettes    Last attempt to quit: 06/07/1999    Years since quitting: 18.3  . Smokeless tobacco: Never Used  Substance and Sexual Activity  . Alcohol use: Not on file  . Drug use: Not on file  . Sexual activity: Yes  Other Topics Concern  . Not on file    Social History Narrative   Lives with husband in [place].   Occupation: truck Geophysicist/field seismologist.    Current Outpatient Medications on File Prior to Visit  Medication Sig Dispense Refill  . Alcohol Swabs PADS Use to test blood sugar 3 times daily. Dx: E11.9 300 each 2  . b complex vitamins tablet Take 1 tablet by mouth daily.    . Biotin 1000 MCG tablet Take 1,000 mcg by mouth daily.    . Blood Glucose Calibration (GLUCOSE CONTROL) SOLN Use to test blood sugar 3 times daily. Dx: E11.9 1 each 2  . Blood Glucose Monitoring Suppl (TRUE METRIX AIR GLUCOSE METER) w/Device KIT USE  TO TEST BLOOD SUGAR THREE TIMES DAILY 1 kit 0  . cholecalciferol (VITAMIN D) 1000 UNITS tablet Take 1,000 Units by mouth daily.    . clopidogrel (PLAVIX) 75 MG tablet TAKE 1 TABLET (75 MG TOTAL) BY MOUTH DAILY. 90 tablet 3  . gabapentin (NEURONTIN) 100 MG capsule Take 1 capsule (100 mg total) by mouth at bedtime. Can increase to 200 or 300 mg as needed for foot pain 90 capsule 3  . gemfibrozil (LOPID) 600 MG tablet TAKE 1 TABLET (600 MG TOTAL) BY  MOUTH 2 (TWO) TIMES DAILY BEFORE A MEAL. 180 tablet 3  . Insulin Syringe-Needle U-100 (INSULIN SYRINGE 1CC/30GX5/16") 30G X 5/16" 1 ML MISC Use to inject insulin 1 time per day. 100 each 2  . ipratropium-albuterol (DUONEB) 0.5-2.5 (3) MG/3ML SOLN Take 3 mLs by nebulization every 4 (four) hours as needed. 360 mL 0  . KLOR-CON M20 20 MEQ tablet TAKE 1 TABLET (20 MEQ TOTAL) BY MOUTH DAILY. 90 tablet 3  . levocetirizine (XYZAL) 5 MG tablet Take 1 tablet (5 mg total) by mouth every evening. (Patient taking differently: Take 5 mg by mouth daily. ) 30 tablet 5  . metFORMIN (GLUCOPHAGE) 1000 MG tablet TAKE 1 TABLET (500 MG TOTAL) BY MOUTH 2 (TWO) TIMES DAILY WITH A MEAL. 180 tablet 1  . Multiple Vitamin (MULTIVITAMIN) tablet Take 0.5 tablets by mouth 2 (two) times daily.     . niacin (NIASPAN) 500 MG CR tablet Take 1 tablet (500 mg total) by mouth at bedtime. 30 tablet 3  . omega-3 acid ethyl  esters (LOVAZA) 1 g capsule Take 2 capsules (2 g total) by mouth 2 (two) times daily. 60 capsule 6  . pantoprazole (PROTONIX) 40 MG tablet TAKE 1 TABLET BY MOUTH EVERY DAY ON AN EMPTY STOMACH 90 tablet 1  . TRUE METRIX BLOOD GLUCOSE TEST test strip CHECK BLOOD SUGAR THREE TIMES DAILY 300 each 12  . vitamin C (ASCORBIC ACID) 500 MG tablet Take 500 mg by mouth daily.     No current facility-administered medications on file prior to visit.     Allergies  Allergen Reactions  . Aspirin     Per pt any pain medications  . Bee Venom Swelling  . Crestor [Rosuvastatin Calcium]   . Ibuprofen Swelling  . Lipitor [Atorvastatin Calcium]   . Poison Oak Extract Itching and Swelling  . Pravastatin   . Simvastatin   . Trulicity [Dulaglutide] Other (See Comments)    flatulance  . Tylenol [Acetaminophen] Swelling    Family History  Problem Relation Age of Onset  . Cancer Mother   . Pneumonia Mother   . Thyroid disease Mother   . Thyroid disease Sister   . Obesity Daughter   . Hypertension Daughter   . Thyroid disease Son   . Pneumonia Maternal Grandmother   . Heart attack Maternal Grandfather   . Diabetes Neg Hx     BP (!) 142/62 (BP Location: Left Arm, Patient Position: Sitting, Cuff Size: Normal)   Pulse 72   Wt 185 lb 3.2 oz (84 kg)   SpO2 93%   BMI 31.79 kg/m    Review of Systems She denies hypoglycemia    Objective:   Physical Exam VITAL SIGNS:  See vs page GENERAL: no distress Pulses: foot pulses are intact bilaterally.   MSK: no deformity of the feet or ankles.  CV: no edema of the legs or ankles Skin:  no ulcer on the feet or ankles.  normal color and temp on the feet and ankles Neuro: sensation is intact to touch on the feet and ankles.   Ext: There is bilateral onychomycosis of the toenails.   Lab Results  Component Value Date   HGBA1C 6.5 10/08/2017       Assessment & Plan:  Insulin-requiring type 2 DM: overcontrolled, given this regimen, which does match  insulin to her changing needs throughout the day.  Patient Instructions  Please reduce the lantus to 60 units each morning. check your blood sugar twice a day.  vary the  time of day when you check, between before the 3 meals, and at bedtime.  also check if you have symptoms of your blood sugar being too high or too low.  please keep a record of the readings and bring it to your next appointment here (or you can bring the meter itself).  You can write it on any piece of paper.  please call us sooner if your blood sugar goes below 70, or if you have a lot of readings over 200. Please come back for a follow-up appointment in 3 months.

## 2017-10-16 ENCOUNTER — Other Ambulatory Visit: Payer: Self-pay

## 2017-10-16 MED ORDER — INSULIN PEN NEEDLE 32G X 4 MM MISC: 4 refills | Status: DC

## 2017-10-18 ENCOUNTER — Other Ambulatory Visit: Payer: Self-pay

## 2017-10-18 MED ORDER — INSULIN PEN NEEDLE 32G X 4 MM MISC: 4 refills | Status: AC

## 2017-10-22 ENCOUNTER — Other Ambulatory Visit: Payer: Self-pay

## 2017-12-12 ENCOUNTER — Other Ambulatory Visit: Payer: Self-pay | Admitting: *Deleted

## 2017-12-12 ENCOUNTER — Other Ambulatory Visit: Payer: Self-pay | Admitting: Family Medicine

## 2017-12-12 DIAGNOSIS — J301 Allergic rhinitis due to pollen: Secondary | ICD-10-CM

## 2017-12-12 DIAGNOSIS — J029 Acute pharyngitis, unspecified: Secondary | ICD-10-CM

## 2017-12-12 MED ORDER — LEVOCETIRIZINE DIHYDROCHLORIDE 5 MG PO TABS: ORAL_TABLET | ORAL | 1 refills | Status: DC

## 2017-12-27 DIAGNOSIS — H04123 Dry eye syndrome of bilateral lacrimal glands: Secondary | ICD-10-CM | POA: Diagnosis not present

## 2017-12-27 DIAGNOSIS — H26492 Other secondary cataract, left eye: Secondary | ICD-10-CM | POA: Diagnosis not present

## 2017-12-27 DIAGNOSIS — E119 Type 2 diabetes mellitus without complications: Secondary | ICD-10-CM | POA: Diagnosis not present

## 2018-01-14 DIAGNOSIS — N3020 Other chronic cystitis without hematuria: Secondary | ICD-10-CM | POA: Diagnosis not present

## 2018-01-14 DIAGNOSIS — N8111 Cystocele, midline: Secondary | ICD-10-CM | POA: Diagnosis not present

## 2018-01-14 DIAGNOSIS — N816 Rectocele: Secondary | ICD-10-CM | POA: Diagnosis not present

## 2018-01-14 DIAGNOSIS — N393 Stress incontinence (female) (male): Secondary | ICD-10-CM | POA: Diagnosis not present

## 2018-02-10 ENCOUNTER — Other Ambulatory Visit: Payer: Self-pay | Admitting: Family Medicine

## 2018-02-10 DIAGNOSIS — K219 Gastro-esophageal reflux disease without esophagitis: Secondary | ICD-10-CM

## 2018-02-24 ENCOUNTER — Other Ambulatory Visit: Payer: Self-pay | Admitting: Family Medicine

## 2018-02-24 DIAGNOSIS — E118 Type 2 diabetes mellitus with unspecified complications: Secondary | ICD-10-CM

## 2018-03-07 ENCOUNTER — Ambulatory Visit: Payer: Medicare HMO | Admitting: Family Medicine

## 2018-03-08 ENCOUNTER — Telehealth: Payer: Self-pay | Admitting: Family Medicine

## 2018-03-08 MED ORDER — SILVER SULFADIAZINE 1 % EX CREA: 1.0000 "application " | TOPICAL_CREAM | Freq: Every day | CUTANEOUS | 1 refills | Status: DC

## 2018-03-08 NOTE — Telephone Encounter (Signed)
Copied from [place]. Topic: Quick Communication - Rx Refill/Question >> Mar 08, 2018 11:23 AM Percell Belt A wrote: Medication: Silvadene  Has the patient contacted their pharmacy? No  (Agent: If no, request that the patient contact the pharmacy for the refill.) Preferred Pharmacy (with phone number or street name): [facility] [address] - pt burned her hand on stove today and would like this called in today Agent: Please be advised that RX refills may take up to 3 business days. We ask that you follow-up with your pharmacy.

## 2018-03-09 NOTE — Progress Notes (Addendum)
[facility] [address], [place], [place] [address] [phone]  Date:  03/11/2018   Name:  Mandy Kim   DOB:  [birth year]   MRN:  [id number]  PCP:  Darreld Mclean, MD    Chief Complaint: Follow-up   History of Present Illness:  Mandy Kim is a 72 y.o. very pleasant female patient who presents with the following:  Periodic follow-up visit today History of breast cancer, DM, hyperlipidemia  Last seen by myself in October of 2018 Needs urine microalbumin this month Her most recent A1c looked great- she has been seeing Dr. Loanne Drilling for her DM until recently  She does not wish to continue seeing endocrinology as her A1c has been under good control and would like me to manage her DM- this is ok with me  She notes that her weight and her sugar seem to fluctuate together.  She has been really watching her diet and her weight is down about 10 lbs from last check Her home glucose readings have been under 150 or so She is on metformin BID.  She stopped using insulin totally as her A1c was so low and also her insulin was expensive, in the "donut hole"  RecentLabs       Lab Results  Component Value Date   HGBA1C 6.5 08/07/2017     From our visit in October Dr. Hale Bogus actually wanted to reduce her insulin a bit as her A1c is under 7% Declines a flu shot today Will check a fasting lipid today as she did not eat anything so far today. She is taking gemfibrozil, niaspan- she has not been able to tolerate a statin She does have a nebulizer machine, but states that she never needs to use and it does not have any respiratory symptoms eve She notes that she has gained a few pounds- has not been staying on her diet all that well as far as calories. She tends to eat more fat and not so much carbs   Would like to check her lipids today- had coffee only so far today Lab Results  Component Value Date   HGBA1C 7.0  10/08/2017   I called in some silvadene for her last week when she burned her left hand on the stove- this helped her quite a bit and her hand is healing well.  Tetanus is UTD  She also has concern that she may be getting a UTI- for about 2 weeks she has noted urinary urgency No hematuria No dysuria Will check a urine for her today  For her cholesterol she is taking otc niacin and fish oil, and also gemfibrozil.  She stopped taking lovaza as it was expensive  Continues to take plavix Getting benefit from neurontin for her foot pain, needs a refill today for her peripheral neuropathy   Patient Active Problem List   Diagnosis Date Noted  . Hypoxia   . Sepsis ([place]) 05/28/2017  . Elevated LFTs 05/28/2017  . Acute lower UTI 05/28/2017  . Headache 05/28/2017  . Dehydration 05/28/2017  . Peripheral neuropathy 07/27/2014  . Other and unspecified hyperlipidemia 02/12/2012  . AAA (abdominal aortic aneurysm) ([place]) 02/12/2012  . Fatty liver 02/12/2012  . Breast cancer ([place]) 02/12/2012  . Cervical cancer ([place]) 02/12/2012  . Diabetes mellitus ([place]) 02/12/2012  . Hyperlipidemia 02/12/2012    Past Medical History:  Diagnosis Date  . Allergy   . Cancer ([place])   .  Cataract   . Chronic kidney disease   . Diabetes mellitus   . Diverticulitis   . GERD (gastroesophageal reflux disease)   . Hyperlipidemia   . Sleep apnea    has c pap machine    Past Surgical History:  Procedure Laterality Date  . ABDOMINAL AORTIC ANEURYSM REPAIR    . ABDOMINAL AORTIC ANEURYSM REPAIR    . ABDOMINAL HYSTERECTOMY  2000  . Bladder tacking    . BREAST SURGERY    . CATARACT EXTRACTION, BILATERAL    . EYE SURGERY    . MASTECTOMY    . TONSILLECTOMY      Social History   Tobacco Use  . Smoking status: Former Smoker    Years: 30.00    Types: Cigarettes    Last attempt to quit: 06/07/1999    Years since quitting: 18.7  . Smokeless tobacco: Never Used  Substance Use Topics  . Alcohol use: Not on file   . Drug use: Not on file    Family History  Problem Relation Age of Onset  . Cancer Mother   . Pneumonia Mother   . Thyroid disease Mother   . Thyroid disease Sister   . Obesity Daughter   . Hypertension Daughter   . Thyroid disease Son   . Pneumonia Maternal Grandmother   . Heart attack Maternal Grandfather   . Diabetes Neg Hx     Allergies  Allergen Reactions  . Aspirin     Per pt any pain medications  . Bee Venom Swelling  . Crestor [Rosuvastatin Calcium]   . Ibuprofen Swelling  . Lipitor [Atorvastatin Calcium]   . Poison Oak Extract Itching and Swelling  . Pravastatin   . Simvastatin   . Trulicity [Dulaglutide] Other (See Comments)    flatulance  . Tylenol [Acetaminophen] Swelling    Medication list has been reviewed and updated.  Current Outpatient Medications on File Prior to Visit  Medication Sig Dispense Refill  . Alcohol Swabs PADS Use to test blood sugar 3 times daily. Dx: E11.9 300 each 2  . b complex vitamins tablet Take 1 tablet by mouth daily.    . Biotin 1000 MCG tablet Take 1,000 mcg by mouth daily.    . Blood Glucose Calibration (GLUCOSE CONTROL) SOLN Use to test blood sugar 3 times daily. Dx: E11.9 1 each 2  . Blood Glucose Monitoring Suppl (TRUE METRIX AIR GLUCOSE METER) w/Device KIT USE  TO TEST BLOOD SUGAR THREE TIMES DAILY 1 kit 0  . cholecalciferol (VITAMIN D) 1000 UNITS tablet Take 1,000 Units by mouth daily.    . clopidogrel (PLAVIX) 75 MG tablet TAKE 1 TABLET (75 MG TOTAL) BY MOUTH DAILY. 90 tablet 3  . gabapentin (NEURONTIN) 100 MG capsule Take 1 capsule (100 mg total) by mouth at bedtime. Can increase to 200 or 300 mg as needed for foot pain 90 capsule 3  . gemfibrozil (LOPID) 600 MG tablet TAKE 1 TABLET (600 MG TOTAL) BY MOUTH 2 (TWO) TIMES DAILY BEFORE A MEAL. 180 tablet 3  . insulin glargine (LANTUS) 100 UNIT/ML injection Inject 0.6 mLs (60 Units total) every morning into the skin. And syringes 1/day 20 mL 11  . Insulin Pen Needle 32G X 4  MM MISC Used to inject insulin 1x daily. 100 each 4  . Insulin Syringe-Needle U-100 (INSULIN SYRINGE 1CC/30GX5/16") 30G X 5/16" 1 ML MISC Use to inject insulin 1 time per day. 100 each 2  . ipratropium-albuterol (DUONEB) 0.5-2.5 (3) MG/3ML SOLN Take 3 mLs  by nebulization every 4 (four) hours as needed. 360 mL 0  . KLOR-CON M20 20 MEQ tablet TAKE 1 TABLET (20 MEQ TOTAL) BY MOUTH DAILY. 90 tablet 3  . levocetirizine (XYZAL) 5 MG tablet TAKE 1 TABLET BY MOUTH EVERY DAY IN THE EVENING 90 tablet 1  . metFORMIN (GLUCOPHAGE) 1000 MG tablet TAKE 1 TABLET (500 MG TOTAL) BY MOUTH 2 (TWO) TIMES DAILY WITH A MEAL. 180 tablet 1  . Multiple Vitamin (MULTIVITAMIN) tablet Take 0.5 tablets by mouth 2 (two) times daily.     . niacin (NIASPAN) 500 MG CR tablet Take 1 tablet (500 mg total) by mouth at bedtime. 30 tablet 3  . omega-3 acid ethyl esters (LOVAZA) 1 g capsule Take 2 capsules (2 g total) by mouth 2 (two) times daily. 60 capsule 6  . OVER THE COUNTER MEDICATION ARCTIC RUBY OIL 50O MG: Take 1 softgel capsule twice daily.    . pantoprazole (PROTONIX) 40 MG tablet TAKE 1 TABLET BY MOUTH EVERY DAY ON AN EMPTY STOMACH 90 tablet 1  . silver sulfADIAZINE (SILVADENE) 1 % cream Apply 1 application topically daily. Use as needed for burn 25 g 1  . TRUE METRIX BLOOD GLUCOSE TEST test strip CHECK BLOOD SUGAR THREE TIMES DAILY 300 each 12  . vitamin C (ASCORBIC ACID) 500 MG tablet Take 500 mg by mouth daily.     No current facility-administered medications on file prior to visit.     Review of Systems:  As per HPI- otherwise negative.   Physical Examination: Vitals:   03/11/18 1318  BP: 124/72  Pulse: 62  Temp: 98.1 F (36.7 C)  SpO2: 94%   Vitals:   03/11/18 1318  Weight: 175 lb 6.4 oz (79.6 kg)   Body mass index is 30.11 kg/m. Ideal Body Weight:    GEN: WDWN, NAD, Non-toxic, A & O x 3, overweight, looks well otherwise  HEENT: Atraumatic, Normocephalic. Neck supple. No masses, No LAD.  Bilateral  TM wnl, oropharynx normal.  PEERL,EOMI.   Ears and Nose: No external deformity. CV: RRR, No M/G/R. No JVD. No thrill. No extra heart sounds. PULM: CTA B, no wheezes, crackles, rhonchi. No retractions. No resp. distress. No accessory muscle use. EXTR: No c/c/e NEURO Normal gait.  PSYCH: Normally interactive. Conversant. Not depressed or anxious appearing.  Calm demeanor.  Healing burn on left palmar surface of hand   BP Readings from Last 3 Encounters:  03/11/18 124/72  10/08/17 (!) 142/62  09/06/17 118/72   Wt Readings from Last 3 Encounters:  03/11/18 175 lb 6.4 oz (79.6 kg)  10/08/17 185 lb 3.2 oz (84 kg)  09/06/17 177 lb (80.3 kg)   Results for orders placed or performed in visit on 03/11/18  POCT urinalysis dipstick  Result Value Ref Range   Color, UA yellow yellow   Clarity, UA clear clear   Glucose, UA negative negative mg/dL   Bilirubin, UA negative negative   Ketones, POC UA negative negative mg/dL   Spec Grav, UA 1.020 1.010 - 1.025   Blood, UA negative negative   pH, UA 6.0 5.0 - 8.0   Protein Ur, POC negative negative mg/dL   Urobilinogen, UA 0.2 0.2 or 1.0 E.U./dL   Nitrite, UA Negative Negative   Leukocytes, UA Negative Negative    Assessment and Plan: Urinary urgency - Plan: Urine Culture, POCT urinalysis dipstick  Dyslipidemia - Plan: Lipid panel, gemfibrozil (LOPID) 600 MG tablet  Uncontrolled type 2 diabetes mellitus without complication, with long-term current use  of insulin ([place]) - Plan: Comprehensive metabolic panel, Hemoglobin A1c, Microalbumin / creatinine urine ratio  Essential hypertension, benign - Plan: Comprehensive metabolic panel, CBC  Diabetic polyneuropathy associated with type 2 diabetes mellitus ([place]) - Plan: gabapentin (NEURONTIN) 100 MG capsule  History of AAA (abdominal aortic aneurysm) repair - Plan: clopidogrel (PLAVIX) 75 MG tablet  Medication monitoring encounter - Plan: potassium chloride SA (KLOR-CON M20) 20 MEQ  tablet  Following up today- will be in touch with her labs We hope that her A1c will continue to look ok She is on on long term potasium- check CMP today Await urine culture- [place] appears benign   Will plan further follow- up pending labs.   Signed Lamar Blinks, MD  Received her labs 4/10 A1c looks ok She cannot take statins She is on gemfibrozil and otc niacin for her cholesterol, had stopped her lovaza I think du eto cost  Letter to pt: Urine is negative for any infection Metabolic profile looks ok!  Blood count is normal A1c shows good control of your blood sugar Your triglycerides are up again- however as you cannot tolerate statins I think we are likely doing the best we can with this.    Your triglycerides have gone up since last check- I think the old omega 3 you were taking was working better (lovaza), but I am not sure if this is affordable for you. If you are able to go back on the Lovaza please just let me know.   No abnormal protein in your urine.   Results for orders placed or performed in visit on 03/11/18  Urine Culture  Result Value Ref Range   MICRO NUMBER: [id number]    SPECIMEN QUALITY: ADEQUATE    Sample Source URINE    STATUS: FINAL    Result: No Growth   Comprehensive metabolic panel  Result Value Ref Range   Sodium 136 135 - 145 mEq/L   Potassium 4.1 3.5 - 5.1 mEq/L   Chloride 102 96 - 112 mEq/L   CO2 25 19 - 32 mEq/L   Glucose, Bld 86 70 - 99 mg/dL   BUN 19 6 - 23 mg/dL   Creatinine, Ser 0.83 0.40 - 1.20 mg/dL   Total Bilirubin 0.5 0.2 - 1.2 mg/dL   Alkaline Phosphatase 33 (L) 39 - 117 U/L   AST 34 0 - 37 U/L   ALT 43 (H) 0 - 35 U/L   Total Protein 7.6 6.0 - 8.3 g/dL   Albumin 4.6 3.5 - 5.2 g/dL   Calcium 9.8 8.4 - 10.5 mg/dL   GFR 71.54 >60.00 mL/min  CBC  Result Value Ref Range   WBC 6.0 4.0 - 10.5 K/uL   RBC 4.42 3.87 - 5.11 Mil/uL   Platelets 256.0 150.0 - 400.0 K/uL   Hemoglobin 13.1 12.0 - 15.0 g/dL   HCT 38.3 36.0 - 46.0 %   MCV  86.6 78.0 - 100.0 fl   MCHC 34.2 30.0 - 36.0 g/dL   RDW 15.6 (H) 11.5 - 15.5 %  Hemoglobin A1c  Result Value Ref Range   Hgb A1c MFr Bld 6.4 4.6 - 6.5 %  Lipid panel  Result Value Ref Range   Cholesterol 211 (H) 0 - 200 mg/dL   Triglycerides (H) 0.0 - 149.0 mg/dL    435.0 Triglyceride is over 400; calculations on Lipids are invalid.   HDL 29.70 (L) >39.00 mg/dL   Total CHOL/HDL Ratio 7   Microalbumin / creatinine urine ratio  Result Value Ref  Range   Microalb, Ur <0.7 0.0 - 1.9 mg/dL   Creatinine,U 29.5 mg/dL   Microalb Creat Ratio 2.4 0.0 - 30.0 mg/g  LDL cholesterol, direct  Result Value Ref Range   Direct LDL 107.0 mg/dL  POCT urinalysis dipstick  Result Value Ref Range   Color, UA yellow yellow   Clarity, UA clear clear   Glucose, UA negative negative mg/dL   Bilirubin, UA negative negative   Ketones, POC UA negative negative mg/dL   Spec Grav, UA 1.020 1.010 - 1.025   Blood, UA negative negative   pH, UA 6.0 5.0 - 8.0   Protein Ur, POC negative negative mg/dL   Urobilinogen, UA 0.2 0.2 or 1.0 E.U./dL   Nitrite, UA Negative Negative   Leukocytes, UA Negative Negative

## 2018-03-11 ENCOUNTER — Ambulatory Visit (INDEPENDENT_AMBULATORY_CARE_PROVIDER_SITE_OTHER): Payer: Medicare HMO | Admitting: Family Medicine

## 2018-03-11 ENCOUNTER — Encounter: Payer: Self-pay | Admitting: Family Medicine

## 2018-03-11 VITALS — BP 124/72 | HR 62 | Temp 98.1°F | Wt 175.4 lb

## 2018-03-11 DIAGNOSIS — Z9889 Other specified postprocedural states: Secondary | ICD-10-CM | POA: Diagnosis not present

## 2018-03-11 DIAGNOSIS — E785 Hyperlipidemia, unspecified: Secondary | ICD-10-CM

## 2018-03-11 DIAGNOSIS — Z794 Long term (current) use of insulin: Secondary | ICD-10-CM

## 2018-03-11 DIAGNOSIS — I10 Essential (primary) hypertension: Secondary | ICD-10-CM | POA: Diagnosis not present

## 2018-03-11 DIAGNOSIS — Z5181 Encounter for therapeutic drug level monitoring: Secondary | ICD-10-CM

## 2018-03-11 DIAGNOSIS — E1142 Type 2 diabetes mellitus with diabetic polyneuropathy: Secondary | ICD-10-CM | POA: Diagnosis not present

## 2018-03-11 DIAGNOSIS — E1165 Type 2 diabetes mellitus with hyperglycemia: Secondary | ICD-10-CM | POA: Diagnosis not present

## 2018-03-11 DIAGNOSIS — R3915 Urgency of urination: Secondary | ICD-10-CM

## 2018-03-11 DIAGNOSIS — IMO0001 Reserved for inherently not codable concepts without codable children: Secondary | ICD-10-CM

## 2018-03-11 LAB — POCT URINALYSIS DIP (MANUAL ENTRY)
BILIRUBIN UA: NEGATIVE
BILIRUBIN UA: NEGATIVE mg/dL
Blood, UA: NEGATIVE
GLUCOSE UA: NEGATIVE mg/dL
LEUKOCYTES UA: NEGATIVE
Nitrite, UA: NEGATIVE
Protein Ur, POC: NEGATIVE mg/dL
SPEC GRAV UA: 1.02 (ref 1.010–1.025)
Urobilinogen, UA: 0.2 E.U./dL
pH, UA: 6 (ref 5.0–8.0)

## 2018-03-11 LAB — COMPREHENSIVE METABOLIC PANEL
ALT: 43 U/L — ABNORMAL HIGH (ref 0–35)
AST: 34 U/L (ref 0–37)
Albumin: 4.6 g/dL (ref 3.5–5.2)
Alkaline Phosphatase: 33 U/L — ABNORMAL LOW (ref 39–117)
BUN: 19 mg/dL (ref 6–23)
CALCIUM: 9.8 mg/dL (ref 8.4–10.5)
CHLORIDE: 102 meq/L (ref 96–112)
CO2: 25 mEq/L (ref 19–32)
CREATININE: 0.83 mg/dL (ref 0.40–1.20)
GFR: 71.54 mL/min (ref >60.00)
Glucose, Bld: 86 mg/dL (ref 70–99)
POTASSIUM: 4.1 meq/L (ref 3.5–5.1)
Sodium: 136 mEq/L (ref 135–145)
Total Bilirubin: 0.5 mg/dL (ref 0.2–1.2)
Total Protein: 7.6 g/dL (ref 6.0–8.3)

## 2018-03-11 LAB — LIPID PANEL
CHOL/HDL RATIO: 7
Cholesterol: 211 mg/dL — ABNORMAL HIGH (ref 0–200)
HDL: 29.7 mg/dL — AB (ref >39.00)
Triglycerides: 435 mg/dL — ABNORMAL HIGH (ref 0.0–149.0)

## 2018-03-11 LAB — MICROALBUMIN / CREATININE URINE RATIO
Creatinine,U: 29.5 mg/dL
MICROALB/CREAT RATIO: 2.4 mg/g (ref 0.0–30.0)
Microalb, Ur: <0.7 mg/dL (ref 0.0–1.9)

## 2018-03-11 LAB — CBC
HEMATOCRIT: 38.3 % (ref 36.0–46.0)
HEMOGLOBIN: 13.1 g/dL (ref 12.0–15.0)
MCHC: 34.2 g/dL (ref 30.0–36.0)
MCV: 86.6 fl (ref 78.0–100.0)
PLATELETS: 256 10*3/uL (ref 150.0–400.0)
RBC: 4.42 Mil/uL (ref 3.87–5.11)
RDW: 15.6 % — ABNORMAL HIGH (ref 11.5–15.5)
WBC: 6 10*3/uL (ref 4.0–10.5)

## 2018-03-11 LAB — HEMOGLOBIN A1C: HEMOGLOBIN A1C: 6.4 % (ref 4.6–6.5)

## 2018-03-11 LAB — LDL CHOLESTEROL, DIRECT: LDL DIRECT: 107 mg/dL

## 2018-03-11 MED ORDER — GEMFIBROZIL 600 MG PO TABS: ORAL_TABLET | ORAL | 3 refills | Status: DC

## 2018-03-11 MED ORDER — POTASSIUM CHLORIDE CRYS ER 20 MEQ PO TBCR: 20.0000 meq | EXTENDED_RELEASE_TABLET | Freq: Every day | ORAL | 3 refills | Status: DC

## 2018-03-11 MED ORDER — CLOPIDOGREL BISULFATE 75 MG PO TABS: 75.0000 mg | ORAL_TABLET | Freq: Every day | ORAL | 3 refills | Status: DC

## 2018-03-11 MED ORDER — GABAPENTIN 100 MG PO CAPS: 100.0000 mg | ORAL_CAPSULE | Freq: Two times a day (BID) | ORAL | 3 refills | Status: DC

## 2018-03-11 NOTE — Patient Instructions (Signed)
I will be in touch with your labs asap- your urine so far looks ok, I will alert you if any bacteria on your culture Will also be in touch with your blood work results asap

## 2018-03-12 LAB — URINE CULTURE
MICRO NUMBER:: 90429649
Result:: NO GROWTH
SPECIMEN QUALITY:: ADEQUATE

## 2018-05-03 ENCOUNTER — Ambulatory Visit: Payer: Medicare HMO | Admitting: *Deleted

## 2018-05-16 ENCOUNTER — Telehealth: Payer: Self-pay | Admitting: Family Medicine

## 2018-05-16 NOTE — Telephone Encounter (Signed)
Lantus injection refill Last Refilled by another provider. Last OV: 03/11/18 PCP: Dr. Lorelei Pont Pharmacy:[facility]

## 2018-05-16 NOTE — Telephone Encounter (Signed)
Copied from [place] [id number]. Topic: Quick Communication - Rx Refill/Question >> May 16, 2018  1:06 PM Cleaster Corin, [place] wrote: Medication: insulin glargine (LANTUS) 100 UNIT/ML injection [[id number]]   Has the patient contacted their pharmacy? no (Agent: If no, request that the patient contact the pharmacy for the refill.) (Agent: If yes, when and what did the pharmacy advise?)  Preferred Pharmacy (with phone number or street name): [facility] [address] [place] [address] Phone: [phone] Fax: [phone]    Agent: Please be advised that RX refills may take up to 3 business days. We ask that you follow-up with your pharmacy.

## 2018-05-17 MED ORDER — INSULIN GLARGINE 100 UNIT/ML ~~LOC~~ SOLN: 60.0000 [IU] | SUBCUTANEOUS | 11 refills | Status: DC

## 2018-05-22 ENCOUNTER — Encounter: Payer: Self-pay | Admitting: Medical

## 2018-05-22 ENCOUNTER — Ambulatory Visit (INDEPENDENT_AMBULATORY_CARE_PROVIDER_SITE_OTHER): Payer: Medicare HMO | Admitting: Medical

## 2018-05-22 VITALS — BP 122/60 | HR 72 | Temp 98.1°F | Resp 16 | Ht 64.0 in | Wt 179.0 lb

## 2018-05-22 DIAGNOSIS — R300 Dysuria: Secondary | ICD-10-CM | POA: Diagnosis not present

## 2018-05-22 DIAGNOSIS — IMO0001 Reserved for inherently not codable concepts without codable children: Secondary | ICD-10-CM

## 2018-05-22 DIAGNOSIS — R350 Frequency of micturition: Secondary | ICD-10-CM | POA: Diagnosis not present

## 2018-05-22 DIAGNOSIS — E1165 Type 2 diabetes mellitus with hyperglycemia: Secondary | ICD-10-CM | POA: Diagnosis not present

## 2018-05-22 DIAGNOSIS — Z794 Long term (current) use of insulin: Secondary | ICD-10-CM | POA: Diagnosis not present

## 2018-05-22 LAB — POC URINALSYSI DIPSTICK (AUTOMATED)
Bilirubin, UA: NEGATIVE
Glucose, UA: NEGATIVE
Ketones, UA: NEGATIVE
Nitrite, UA: POSITIVE
PROTEIN UA: POSITIVE — AB
RBC UA: 3
SPEC GRAV UA: 1.025 (ref 1.010–1.025)
UROBILINOGEN UA: 0.2 U/dL
pH, UA: 5.5 (ref 5.0–8.0)

## 2018-05-22 MED ORDER — INSULIN GLARGINE 100 UNITS/ML SOLOSTAR PEN: PEN_INJECTOR | SUBCUTANEOUS | 3 refills | Status: DC

## 2018-05-22 MED ORDER — ALCOHOL SWABS PADS: MEDICATED_PAD | 2 refills | Status: DC

## 2018-05-22 MED ORDER — CIPROFLOXACIN HCL 500 MG PO TABS: 500.0000 mg | ORAL_TABLET | Freq: Two times a day (BID) | ORAL | 0 refills | Status: DC

## 2018-05-22 NOTE — Progress Notes (Signed)
Pt reporting flank pain X 1 day, blood in urine X 5 days. Pt. requesting lantus refill to [facility] ASAP.

## 2018-05-22 NOTE — Progress Notes (Signed)
Subjective:    Patient ID: Mandy Kim, female    DOB: 1946-03-23, 72 y.o.   MRN: [id number]  HPI  Pt in states since Friday got appearance of pinkish brown to her urine. This morning bright red urine. Pt states pain on urination. More frequent urination and smaller volumes.  Hx of uti in past.  Pt gives history of uti in march or April. She also had RMSF at that time.  No fever, no chills or sweats.  Some rt side flank pain.   Review of Systems  Constitutional: Negative for chills, fatigue and fever.  Respiratory: Negative for cough, chest tightness, shortness of breath and wheezing.   Cardiovascular: Negative for chest pain and palpitations.  Gastrointestinal: Negative for abdominal distention, abdominal pain, blood in stool, constipation, diarrhea and nausea.  Genitourinary: Positive for dysuria, frequency, hematuria and urgency. Negative for enuresis, flank pain, genital sores and pelvic pain.  Musculoskeletal: Positive for back pain.  Skin: Negative for rash.  Neurological: Negative for dizziness, seizures, speech difficulty, weakness, light-headedness and headaches.  Hematological: Negative for adenopathy. Does not bruise/bleed easily.  Psychiatric/Behavioral: Negative for behavioral problems and confusion.    Past Medical History:  Diagnosis Date  . Allergy   . Cancer ([place])   . Cataract   . Chronic kidney disease   . Diabetes mellitus   . Diverticulitis   . GERD (gastroesophageal reflux disease)   . Hyperlipidemia   . Sleep apnea    has c pap machine     Social History   Socioeconomic History  . Marital status: Married    Spouse name: Not on file  . Number of children: Not on file  . Years of education: Not on file  . Highest education level: Not on file  Occupational History  . Not on file  Social Needs  . Financial resource strain: Not on file  . Food insecurity:    Worry: Not on file    Inability: Not on file  . Transportation needs:   Medical: Not on file    Non-medical: Not on file  Tobacco Use  . Smoking status: Former Smoker    Years: 30.00    Types: Cigarettes    Last attempt to quit: 06/07/1999    Years since quitting: 18.9  . Smokeless tobacco: Never Used  Substance and Sexual Activity  . Alcohol use: Not on file  . Drug use: Not on file  . Sexual activity: Yes  Lifestyle  . Physical activity:    Days per week: Not on file    Minutes per session: Not on file  . Stress: Not on file  Relationships  . Social connections:    Talks on phone: Not on file    Gets together: Not on file    Attends religious service: Not on file    Active member of club or organization: Not on file    Attends meetings of clubs or organizations: Not on file    Relationship status: Not on file  . Intimate partner violence:    Fear of current or ex partner: Not on file    Emotionally abused: Not on file    Physically abused: Not on file    Forced sexual activity: Not on file  Other Topics Concern  . Not on file  Social History Narrative   Lives with husband in [place].   Occupation: truck Geophysicist/field seismologist.    Past Surgical History:  Procedure Laterality Date  . ABDOMINAL AORTIC ANEURYSM REPAIR    .  ABDOMINAL AORTIC ANEURYSM REPAIR    . ABDOMINAL HYSTERECTOMY  2000  . Bladder tacking    . BREAST SURGERY    . CATARACT EXTRACTION, BILATERAL    . EYE SURGERY    . MASTECTOMY    . TONSILLECTOMY      Family History  Problem Relation Age of Onset  . Cancer Mother   . Pneumonia Mother   . Thyroid disease Mother   . Thyroid disease Sister   . Obesity Daughter   . Hypertension Daughter   . Thyroid disease Son   . Pneumonia Maternal Grandmother   . Heart attack Maternal Grandfather   . Diabetes Neg Hx     Allergies  Allergen Reactions  . Aspirin     Per pt any pain medications  . Bee Venom Swelling  . Crestor [Rosuvastatin Calcium]   . Ibuprofen Swelling  . Lipitor [Atorvastatin Calcium]   . Poison Oak Extract Itching  and Swelling  . Pravastatin   . Simvastatin   . Trulicity [Dulaglutide] Other (See Comments)    flatulance  . Tylenol [Acetaminophen] Swelling    Current Outpatient Medications on File Prior to Visit  Medication Sig Dispense Refill  . Alcohol Swabs PADS Use to test blood sugar 3 times daily. Dx: E11.9 300 each 2  . b complex vitamins tablet Take 1 tablet by mouth daily.    . Biotin 1000 MCG tablet Take 1,000 mcg by mouth daily.    . Blood Glucose Calibration (GLUCOSE CONTROL) SOLN Use to test blood sugar 3 times daily. Dx: E11.9 1 each 2  . Blood Glucose Monitoring Suppl (TRUE METRIX AIR GLUCOSE METER) w/Device KIT USE  TO TEST BLOOD SUGAR THREE TIMES DAILY 1 kit 0  . cholecalciferol (VITAMIN D) 1000 UNITS tablet Take 1,000 Units by mouth daily.    . clopidogrel (PLAVIX) 75 MG tablet Take 1 tablet (75 mg total) by mouth daily. 90 tablet 3  . gabapentin (NEURONTIN) 100 MG capsule Take 1 capsule (100 mg total) by mouth 2 (two) times daily. 180 capsule 3  . gemfibrozil (LOPID) 600 MG tablet TAKE 1 TABLET (600 MG TOTAL) BY MOUTH 2 (TWO) TIMES DAILY BEFORE A MEAL. 180 tablet 3  . insulin glargine (LANTUS) 100 UNIT/ML injection Inject 0.6 mLs (60 Units total) into the skin every morning. And syringes 1/day 20 mL 11  . Insulin Pen Needle 32G X 4 MM MISC Used to inject insulin 1x daily. 100 each 4  . Insulin Syringe-Needle U-100 (INSULIN SYRINGE 1CC/30GX5/16") 30G X 5/16" 1 ML MISC Use to inject insulin 1 time per day. 100 each 2  . levocetirizine (XYZAL) 5 MG tablet TAKE 1 TABLET BY MOUTH EVERY DAY IN THE EVENING 90 tablet 1  . metFORMIN (GLUCOPHAGE) 1000 MG tablet TAKE 1 TABLET (500 MG TOTAL) BY MOUTH 2 (TWO) TIMES DAILY WITH A MEAL. 180 tablet 1  . Multiple Vitamin (MULTIVITAMIN) tablet Take 0.5 tablets by mouth 2 (two) times daily.     . niacin (NIASPAN) 500 MG CR tablet Take 1 tablet (500 mg total) by mouth at bedtime. 30 tablet 3  . Omega-3 Fatty Acids (FISH OIL) 1000 MG CAPS Take by mouth 2  (two) times daily.    . pantoprazole (PROTONIX) 40 MG tablet TAKE 1 TABLET BY MOUTH EVERY DAY ON AN EMPTY STOMACH 90 tablet 1  . potassium chloride SA (KLOR-CON M20) 20 MEQ tablet Take 1 tablet (20 mEq total) by mouth daily. 90 tablet 3  . TRUE METRIX BLOOD GLUCOSE  TEST test strip CHECK BLOOD SUGAR THREE TIMES DAILY 300 each 12   No current facility-administered medications on file prior to visit.     BP 122/60 (BP Location: Left Arm, Patient Position: Sitting, Cuff Size: Large)   Pulse 72   Temp 98.1 F (36.7 C)   Resp 16   Ht 5' 4"  (1.626 m)   Wt 179 lb (81.2 kg)   SpO2 92%   BMI 30.73 kg/m       Objective:   Physical Exam  General Appearance- Not in acute distress.  HEENT Eyes- Scleraeral/Conjuntiva-bilat- Not Yellow. Mouth & Throat- Normal.  Chest and Lung Exam Auscultation: Breath sounds:-Normal. Adventitious sounds:- No Adventitious sounds.  Cardiovascular Auscultation:Rythm - Regular. Heart Sounds -Normal heart sounds.  Abdomen Inspection:-Inspection Normal.  Palpation/Perucssion: Palpation and Percussion of the abdomen reveal- no suprapubic Tender, No Rebound tenderness, No rigidity(Guarding) and No Palpable abdominal masses.  Liver:-Normal.  Spleen:- Normal.   Back- no current cva tenderness presently.     Assessment & Plan:  You do have symptoms and history consistent with UTI.  I am going to prescribe the Cipro 500 mg twice daily for 7 days pending urine culture results.  We will update you on culture results when those are in.  If your symptoms worsen or change pending culture result please let us know.  You also mention a refill request for your Lantus.  I printed that today and will ask staff to fax to mail order pharmacy.  Also sent alcohol swab prescription to pharmacy.  Follow-up in 7 to 10 days or as needed.  Note prior use of cipro and pt reports no adverse side effects.  Mackie Pai, PA-C

## 2018-05-22 NOTE — Patient Instructions (Signed)
You do have symptoms and history consistent with UTI.  I am going to prescribe the Cipro 500 mg twice daily for 7 days pending urine culture results.  We will update you on culture results when those are in.  If your symptoms worsen or change pending culture result please let us know.  You also mention a refill request for your Lantus.  I printed that today and will ask staff to fax to mail order pharmacy.  Also sent alcohol swab prescription to pharmacy.  Follow-up in 7 to 10 days or as needed.

## 2018-05-24 ENCOUNTER — Telehealth: Payer: Self-pay | Admitting: Family Medicine

## 2018-05-24 LAB — URINE CULTURE
MICRO NUMBER: 90734368
SPECIMEN QUALITY:: ADEQUATE

## 2018-05-24 NOTE — Telephone Encounter (Signed)
Copied from [place] [id number]. Topic: Quick Communication - Lab Results >> May 24, 2018  8:52 AM Jiles Prows, CMA wrote: Called patient to inform them of 05-22-18 lab results. When patient returns call, triage nurse may disclose results.

## 2018-05-24 NOTE — Telephone Encounter (Signed)
Patient returning call.

## 2018-05-24 NOTE — Telephone Encounter (Signed)
See result note.  

## 2018-07-01 DIAGNOSIS — N3020 Other chronic cystitis without hematuria: Secondary | ICD-10-CM | POA: Diagnosis not present

## 2018-07-31 ENCOUNTER — Other Ambulatory Visit: Payer: Self-pay | Admitting: Medical

## 2018-08-04 ENCOUNTER — Other Ambulatory Visit: Payer: Self-pay | Admitting: Family Medicine

## 2018-08-04 DIAGNOSIS — K219 Gastro-esophageal reflux disease without esophagitis: Secondary | ICD-10-CM

## 2018-08-13 DIAGNOSIS — N3020 Other chronic cystitis without hematuria: Secondary | ICD-10-CM | POA: Diagnosis not present

## 2018-08-16 ENCOUNTER — Other Ambulatory Visit: Payer: Self-pay | Admitting: Family Medicine

## 2018-08-16 DIAGNOSIS — E118 Type 2 diabetes mellitus with unspecified complications: Secondary | ICD-10-CM

## 2018-09-09 ENCOUNTER — Encounter: Payer: Self-pay | Admitting: Family Medicine

## 2018-09-09 ENCOUNTER — Ambulatory Visit (INDEPENDENT_AMBULATORY_CARE_PROVIDER_SITE_OTHER): Payer: Medicare HMO | Admitting: Family Medicine

## 2018-09-09 VITALS — BP 128/68 | HR 92 | Temp 98.0°F | Resp 16 | Ht 64.0 in | Wt 182.0 lb

## 2018-09-09 DIAGNOSIS — IMO0001 Reserved for inherently not codable concepts without codable children: Secondary | ICD-10-CM

## 2018-09-09 DIAGNOSIS — R311 Benign essential microscopic hematuria: Secondary | ICD-10-CM | POA: Diagnosis not present

## 2018-09-09 DIAGNOSIS — N12 Tubulo-interstitial nephritis, not specified as acute or chronic: Secondary | ICD-10-CM

## 2018-09-09 DIAGNOSIS — Z794 Long term (current) use of insulin: Secondary | ICD-10-CM

## 2018-09-09 DIAGNOSIS — M62838 Other muscle spasm: Secondary | ICD-10-CM

## 2018-09-09 DIAGNOSIS — E1165 Type 2 diabetes mellitus with hyperglycemia: Secondary | ICD-10-CM | POA: Diagnosis not present

## 2018-09-09 LAB — POCT URINALYSIS DIP (MANUAL ENTRY)
BILIRUBIN UA: NEGATIVE
NITRITE UA: POSITIVE — AB
Protein Ur, POC: 30 mg/dL — AB
Spec Grav, UA: 1.02 (ref 1.010–1.025)
Urobilinogen, UA: 0.2 E.U./dL
pH, UA: 5.5 (ref 5.0–8.0)

## 2018-09-09 MED ORDER — CIPROFLOXACIN HCL 500 MG PO TABS: 500.0000 mg | ORAL_TABLET | Freq: Two times a day (BID) | ORAL | 0 refills | Status: DC

## 2018-09-09 NOTE — Progress Notes (Addendum)
[facility] [address], [place], [place] [address] [phone]  Date:  09/09/2018   Name:  Mandy Kim   DOB:  Apr 04, 1946   MRN:  [id number]  PCP:  Darreld Mclean, MD    Chief Complaint: Hematuria (since [place] with urination, flank pain no fever, states she is taking antibiotic daily but not on list)   History of Present Illness:  Mandy Kim is a 72 y.o. very pleasant female patient who presents with the following:  History of breast cancer s/p mastectomy, DM, hyperlipidemia  Lab Results  Component Value Date   HGBA1C 6.4 03/11/2018  here today with concern of possible recurrent UTI She is on daily keflex 250 per her urologist Dr. Louis Meckel- last visit with him in July  She notess onset of pain in her right lower back on Friday- today is Monday She also has noted hematuria urinary frequency and pain No fever noted No belly pain but her back does hurt  Most recent urine culture 6/19 E coli with some resistance   She is due to catch up on her DM labs today as well She has gained weight and is afraid her numbers will not be good   Patient Active Problem List   Diagnosis Date Noted  . Hypoxia   . Sepsis ([place]) 05/28/2017  . Elevated LFTs 05/28/2017  . Acute lower UTI 05/28/2017  . Headache 05/28/2017  . Dehydration 05/28/2017  . Peripheral neuropathy 07/27/2014  . Other and unspecified hyperlipidemia 02/12/2012  . AAA (abdominal aortic aneurysm) ([place]) 02/12/2012  . Fatty liver 02/12/2012  . Breast cancer ([place]) 02/12/2012  . Cervical cancer ([place]) 02/12/2012  . Diabetes mellitus ([place]) 02/12/2012  . Hyperlipidemia 02/12/2012    Past Medical History:  Diagnosis Date  . Allergy   . Cancer ([place])   . Cataract   . Chronic kidney disease   . Diabetes mellitus   . Diverticulitis   . GERD (gastroesophageal reflux disease)   . Hyperlipidemia   . Sleep apnea    has c pap machine    Past  Surgical History:  Procedure Laterality Date  . ABDOMINAL AORTIC ANEURYSM REPAIR    . ABDOMINAL AORTIC ANEURYSM REPAIR    . ABDOMINAL HYSTERECTOMY  2000  . Bladder tacking    . BREAST SURGERY    . CATARACT EXTRACTION, BILATERAL    . EYE SURGERY    . MASTECTOMY    . TONSILLECTOMY      Social History   Tobacco Use  . Smoking status: Former Smoker    Years: 30.00    Types: Cigarettes    Last attempt to quit: 06/07/1999    Years since quitting: 19.2  . Smokeless tobacco: Never Used  Substance Use Topics  . Alcohol use: Not on file  . Drug use: Not on file    Family History  Problem Relation Age of Onset  . Cancer Mother   . Pneumonia Mother   . Thyroid disease Mother   . Thyroid disease Sister   . Obesity Daughter   . Hypertension Daughter   . Thyroid disease Son   . Pneumonia Maternal Grandmother   . Heart attack Maternal Grandfather   . Diabetes Neg Hx     Allergies  Allergen Reactions  . Aspirin     Per pt any pain medications  . Bee Venom Swelling  . Crestor [Rosuvastatin Calcium]   . Ibuprofen Swelling  .  Lipitor [Atorvastatin Calcium]   . Poison Oak Extract Itching and Swelling  . Pravastatin   . Simvastatin   . Trulicity [Dulaglutide] Other (See Comments)    flatulance  . Tylenol [Acetaminophen] Swelling    Medication list has been reviewed and updated.  Current Outpatient Medications on File Prior to Visit  Medication Sig Dispense Refill  . Alcohol Swabs PADS Use to test blood sugar 3 times daily. Dx: E11.9 300 each 2  . b complex vitamins tablet Take 1 tablet by mouth daily.    . Biotin 1000 MCG tablet Take 1,000 mcg by mouth daily.    . Blood Glucose Calibration (GLUCOSE CONTROL) SOLN Use to test blood sugar 3 times daily. Dx: E11.9 1 each 2  . Blood Glucose Monitoring Suppl (TRUE METRIX AIR GLUCOSE METER) w/Device KIT USE  TO TEST BLOOD SUGAR THREE TIMES DAILY 1 kit 0  . cholecalciferol (VITAMIN D) 1000 UNITS tablet Take 1,000 Units by mouth  daily.    . clopidogrel (PLAVIX) 75 MG tablet Take 1 tablet (75 mg total) by mouth daily. 90 tablet 3  . gabapentin (NEURONTIN) 100 MG capsule Take 1 capsule (100 mg total) by mouth 2 (two) times daily. 180 capsule 3  . gemfibrozil (LOPID) 600 MG tablet TAKE 1 TABLET (600 MG TOTAL) BY MOUTH 2 (TWO) TIMES DAILY BEFORE A MEAL. 180 tablet 3  . Insulin Glargine (LANTUS SOLOSTAR) 100 UNIT/ML Solostar Pen INJECT  60 UNITS SUBCUTANEOUSLY IN THE MORNING 60 mL 3  . Insulin Pen Needle 32G X 4 MM MISC Used to inject insulin 1x daily. 100 each 4  . Insulin Syringe-Needle U-100 (INSULIN SYRINGE 1CC/30GX5/16") 30G X 5/16" 1 ML MISC Use to inject insulin 1 time per day. 100 each 2  . levocetirizine (XYZAL) 5 MG tablet TAKE 1 TABLET BY MOUTH EVERY DAY IN THE EVENING 90 tablet 1  . metFORMIN (GLUCOPHAGE) 1000 MG tablet TAKE 1 TABLET (500 MG TOTAL) BY MOUTH 2 (TWO) TIMES DAILY WITH A MEAL. 180 tablet 1  . Multiple Vitamin (MULTIVITAMIN) tablet Take 0.5 tablets by mouth 2 (two) times daily.     . niacin (NIASPAN) 500 MG CR tablet Take 1 tablet (500 mg total) by mouth at bedtime. 30 tablet 3  . Omega-3 Fatty Acids (FISH OIL) 1000 MG CAPS Take by mouth 2 (two) times daily.    . pantoprazole (PROTONIX) 40 MG tablet TAKE 1 TABLET BY MOUTH EVERY DAY ON AN EMPTY STOMACH 90 tablet 1  . potassium chloride SA (KLOR-CON M20) 20 MEQ tablet Take 1 tablet (20 mEq total) by mouth daily. 90 tablet 3  . TRUE METRIX BLOOD GLUCOSE TEST test strip CHECK BLOOD SUGAR THREE TIMES DAILY 300 each 12   No current facility-administered medications on file prior to visit.     Review of Systems:  As per HPI- otherwise negative. No fever noted   Physical Examination: Vitals:   09/09/18 1614  BP: 128/68  Pulse: 92  Resp: 16  Temp: 98 F (36.7 C)  SpO2: 98%   Vitals:   09/09/18 1614  Weight: 182 lb (82.6 kg)  Height: _0  (1.626 m)   Body mass index is 31.24 kg/m. Ideal Body Weight: Weight in (lb) to have BMI = 25:  145.3  GEN: WDWN, NAD, Non-toxic, A & O x 3 HEENT: Atraumatic, Normocephalic. Neck supple. No masses, No LAD. Ears and Nose: No external deformity. CV: RRR, No M/G/R. No JVD. No thrill. No extra heart sounds. PULM: CTA B, no wheezes,  crackles, rhonchi. No retractions. No resp. distress. No accessory muscle use. No CVA tenderness on exam  ABD: S, NT, ND. No rebound. No HSM. EXTR: No c/c/e NEURO Normal gait.  PSYCH: Normally interactive. Conversant. Not depressed or anxious appearing.  Calm demeanor.   Wt Readings from Last 3 Encounters:  09/09/18 182 lb (82.6 kg)  05/22/18 179 lb (81.2 kg)  03/11/18 175 lb 6.4 oz (79.6 kg)   Results for orders placed or performed in visit on 09/09/18  POCT urinalysis dipstick  Result Value Ref Range   Color, UA straw (A) yellow   Clarity, UA cloudy (A) clear   Glucose, UA =250 (A) negative mg/dL   Bilirubin, UA negative negative   Ketones, POC UA trace (5) (A) negative mg/dL   Spec Grav, UA 1.020 1.010 - 1.025   Blood, UA trace-intact (A) negative   pH, UA 5.5 5.0 - 8.0   Protein Ur, POC =30 (A) negative mg/dL   Urobilinogen, UA 0.2 0.2 or 1.0 E.U./dL   Nitrite, UA Positive (A) Negative   Leukocytes, UA Moderate (2+) (A) Negative    Assessment and Plan: Benign essential microscopic hematuria - Plan: Urine Culture, POCT urinalysis dipstick  Muscle spasm - Plan: CBC, Comprehensive metabolic panel  Uncontrolled type 2 diabetes mellitus without complication, with long-term current use of insulin (HCC) - Plan: Hemoglobin A1c  Pyelonephritis - Plan: ciprofloxacin (CIPRO) 500 MG tablet  Treat for possible pyelo with cipro while culture is pending She will let me know if not improving in the next couple of days- Sooner if worse.   Will plan further follow- up pending labs.   Signed Lamar Blinks, MD  Received her labs 10/10- called her  She is on cipro which should clear this infection Sodium is 137 on recheck  Her urine sx are  better She is seeing me Monday and we will discuss a plan to bring her DM under better control- also need to follow-up on her renal function  Results for orders placed or performed in visit on 09/09/18  Urine Culture  Result Value Ref Range   MICRO NUMBER: [id number]    SPECIMEN QUALITY: ADEQUATE    Sample Source URINE    STATUS: FINAL    ISOLATE 1: Enterobacter cloacae complex (A)       Susceptibility   Enterobacter cloacae complex - URINE CULTURE, REFLEX    AMOX/CLAVULANIC >=32 Resistant     CEFAZOLIN* >=64 Resistant      * For uncomplicated UTI caused by E. coli,K. pneumoniae or P. mirabilis: Cefazolin issusceptible if MIC <32 mcg/mL and predictssusceptible to the oral agents cefaclor, cefdinir,cefpodoxime, cefprozil, cefuroxime, cephalexinand loracarbef.    CEFEPIME <=1 Sensitive     CEFTRIAXONE <=1 Sensitive     CIPROFLOXACIN <=0.25 Sensitive     LEVOFLOXACIN <=0.12 Sensitive     ERTAPENEM <=0.5 Sensitive     GENTAMICIN <=1 Sensitive     IMIPENEM 0.5 Sensitive     NITROFURANTOIN 32 Sensitive     PIP/TAZO 8 Sensitive     TOBRAMYCIN <=1 Sensitive     TRIMETH/SULFA* <=20 Sensitive      * For uncomplicated UTI caused by E. coli,K. pneumoniae or P. mirabilis: Cefazolin issusceptible if MIC <32 mcg/mL and predictssusceptible to the oral agents cefaclor, cefdinir,cefpodoxime, cefprozil, cefuroxime, cephalexinand loracarbef.Legend:S = Susceptible  I = IntermediateR = Resistant  NS = Not susceptible* = Not tested  NR = Not reported**NN = See antimicrobic comments  Comprehensive metabolic panel  Result Value Ref Range  Sodium 133 (L) 135 - 145 mEq/L   Potassium 4.9 3.5 - 5.1 mEq/L   Chloride 99 96 - 112 mEq/L   CO2 22 19 - 32 mEq/L   Glucose, Bld 306 (H) 70 - 99 mg/dL   BUN 33 (H) 6 - 23 mg/dL   Creatinine, Ser 1.27 (H) 0.40 - 1.20 mg/dL   Total Bilirubin 0.6 0.2 - 1.2 mg/dL   Alkaline Phosphatase 44 39 - 117 U/L   AST 57 (H) 0 - 37 U/L   ALT 38 (H) 0 - 35 U/L   Total Protein 6.8  6.0 - 8.3 g/dL   Albumin 5.1 3.5 - 5.2 g/dL   Calcium 10.3 8.4 - 10.5 mg/dL   GFR 43.73 (L) >60.00 mL/min  Hemoglobin A1c  Result Value Ref Range   Hgb A1c MFr Bld 10.1 (H) 4.6 - 6.5 %  POCT urinalysis dipstick  Result Value Ref Range   Color, UA straw (A) yellow   Clarity, UA cloudy (A) clear   Glucose, UA =250 (A) negative mg/dL   Bilirubin, UA negative negative   Ketones, POC UA trace (5) (A) negative mg/dL   Spec Grav, UA 1.020 1.010 - 1.025   Blood, UA trace-intact (A) negative   pH, UA 5.5 5.0 - 8.0   Protein Ur, POC =30 (A) negative mg/dL   Urobilinogen, UA 0.2 0.2 or 1.0 E.U./dL   Nitrite, UA Positive (A) Negative   Leukocytes, UA Moderate (2+) (A) Negative    Lab Results  Component Value Date   WBC 7.1 09/10/2018   HGB 12.9 09/10/2018   HCT 38.7 09/10/2018   MCV 84.3 09/10/2018   PLT 364 09/10/2018

## 2018-09-09 NOTE — Patient Instructions (Signed)
We are going to treat you for a possible kidney infection with cipro twice a day for a week I will send your urine for a culture as well - will be in touch with your results asap  Please let me know if you are not feeling better in the next couple of days- Sooner if worse.

## 2018-09-10 ENCOUNTER — Other Ambulatory Visit: Payer: Medicare HMO

## 2018-09-10 DIAGNOSIS — N10 Acute tubulo-interstitial nephritis: Secondary | ICD-10-CM | POA: Diagnosis not present

## 2018-09-10 DIAGNOSIS — M62838 Other muscle spasm: Secondary | ICD-10-CM | POA: Diagnosis not present

## 2018-09-10 LAB — COMPREHENSIVE METABOLIC PANEL
ALK PHOS: 44 U/L (ref 39–117)
ALT: 38 U/L — AB (ref 0–35)
AST: 57 U/L — ABNORMAL HIGH (ref 0–37)
Albumin: 5.1 g/dL (ref 3.5–5.2)
BILIRUBIN TOTAL: 0.6 mg/dL (ref 0.2–1.2)
BUN: 33 mg/dL — ABNORMAL HIGH (ref 6–23)
CALCIUM: 10.3 mg/dL (ref 8.4–10.5)
CO2: 22 meq/L (ref 19–32)
Chloride: 99 mEq/L (ref 96–112)
Creatinine, Ser: 1.27 mg/dL — ABNORMAL HIGH (ref 0.40–1.20)
GFR: 43.73 mL/min — ABNORMAL LOW (ref >60.00)
GLUCOSE: 306 mg/dL — AB (ref 70–99)
POTASSIUM: 4.9 meq/L (ref 3.5–5.1)
Sodium: 133 mEq/L — ABNORMAL LOW (ref 135–145)
Total Protein: 6.8 g/dL (ref 6.0–8.3)

## 2018-09-10 LAB — CBC
HEMATOCRIT: 38.7 % (ref 35.0–45.0)
Hemoglobin: 12.9 g/dL (ref 11.7–15.5)
MCH: 28.1 pg (ref 27.0–33.0)
MCHC: 33.3 g/dL (ref 32.0–36.0)
MCV: 84.3 fL (ref 80.0–100.0)
MPV: 12.3 fL (ref 7.5–12.5)
PLATELETS: 364 10*3/uL (ref 140–400)
RBC: 4.59 10*6/uL (ref 3.80–5.10)
RDW: 15.8 % — AB (ref 11.0–15.0)
WBC: 7.1 10*3/uL (ref 3.8–10.8)

## 2018-09-10 LAB — HEMOGLOBIN A1C: Hgb A1c MFr Bld: 10.1 % — ABNORMAL HIGH (ref 4.6–6.5)

## 2018-09-12 LAB — URINE CULTURE
MICRO NUMBER:: 91202313
SPECIMEN QUALITY: ADEQUATE

## 2018-09-14 NOTE — Progress Notes (Signed)
[place] at [facility] [address] [place], [place] [address] [phone]  Date:  09/16/2018   Name:  Mandy Kim   DOB:  10-18-1946   MRN:  [id number]  PCP:  Darreld Mclean, MD    Chief Complaint: Dyslipidemia (6 month follow up) and Urinary Tract Infection (follow up, left urine sample)   History of Present Illness:  Mandy Kim is a 72 y.o. very pleasant female patient who presents with the following:  Routine follow-up visit today, but I just saw Mandy Kim last week with concern of UTI- we treated this but her A1c was quite high.  Her urine culture was positive for enterobacter Lab Results  Component Value Date   HGBA1C 10.1 (H) 09/09/2018    Per my last notes; She is on cipro which should clear this infection Sodium is 137 on recheck  Her urine sx are better She is seeing me Monday and we will discuss a plan to bring her DM under better control- also need to follow-up on her renal function   Flu: declines today We need to discuss her DM today She is on insulin and metformin 500 BID She is taking 60 u of lantus once a day- however she stopped taking this about a month ago when she had trouble getting it filled, but she does have it at home now and went back on it a few days ago.  She notes that her glucose has still been up to about 300 She has not been checking her glucose that often but is able to do so Weight is up just a few lbs  She did try trulicity in the past but it gave her gas- she was not actually allergic however.  She is interested in trying some victoza instead.   Wt Readings from Last 3 Encounters:  09/16/18 181 lb (82.1 kg)  09/09/18 182 lb (82.6 kg)  05/22/18 179 lb (81.2 kg)   She is still finishing out her cipro course, will take her last pill tomorrow Urinary urgency continues but this is likely just baseline for her   Patient Active Problem List   Diagnosis Date Noted  . Hypoxia    . Sepsis ([place]) 05/28/2017  . Elevated LFTs 05/28/2017  . Acute lower UTI 05/28/2017  . Headache 05/28/2017  . Dehydration 05/28/2017  . Peripheral neuropathy 07/27/2014  . Other and unspecified hyperlipidemia 02/12/2012  . AAA (abdominal aortic aneurysm) ([place]) 02/12/2012  . Fatty liver 02/12/2012  . Breast cancer ([place]) 02/12/2012  . Cervical cancer ([place]) 02/12/2012  . Diabetes mellitus ([place]) 02/12/2012  . Hyperlipidemia 02/12/2012    Past Medical History:  Diagnosis Date  . Allergy   . Cancer ([place])   . Cataract   . Chronic kidney disease   . Diabetes mellitus   . Diverticulitis   . GERD (gastroesophageal reflux disease)   . Hyperlipidemia   . Sleep apnea    has c pap machine    Past Surgical History:  Procedure Laterality Date  . ABDOMINAL AORTIC ANEURYSM REPAIR    . ABDOMINAL AORTIC ANEURYSM REPAIR    . ABDOMINAL HYSTERECTOMY  2000  . Bladder tacking    . BREAST SURGERY    . CATARACT EXTRACTION, BILATERAL    . EYE SURGERY    . MASTECTOMY    . TONSILLECTOMY      Social History   Tobacco Use  . Smoking status: Former Smoker  Years: 30.00    Types: Cigarettes    Last attempt to quit: 06/07/1999    Years since quitting: 19.2  . Smokeless tobacco: Never Used  Substance Use Topics  . Alcohol use: Not on file  . Drug use: Not on file    Family History  Problem Relation Age of Onset  . Cancer Mother   . Pneumonia Mother   . Thyroid disease Mother   . Thyroid disease Sister   . Obesity Daughter   . Hypertension Daughter   . Thyroid disease Son   . Pneumonia Maternal Grandmother   . Heart attack Maternal Grandfather   . Diabetes Neg Hx     Allergies  Allergen Reactions  . Aspirin     Per pt any pain medications  . Bee Venom Swelling  . Crestor [Rosuvastatin Calcium]   . Ibuprofen Swelling  . Lipitor [Atorvastatin Calcium]   . Poison Oak Extract Itching and Swelling  . Pravastatin   . Simvastatin   . Trulicity [Dulaglutide] Other (See Comments)     flatulance  . Tylenol [Acetaminophen] Swelling    Medication list has been reviewed and updated.  Current Outpatient Medications on File Prior to Visit  Medication Sig Dispense Refill  . Alcohol Swabs PADS Use to test blood sugar 3 times daily. Dx: E11.9 300 each 2  . b complex vitamins tablet Take 1 tablet by mouth daily.    . Biotin 1000 MCG tablet Take 1,000 mcg by mouth daily.    . Blood Glucose Calibration (GLUCOSE CONTROL) SOLN Use to test blood sugar 3 times daily. Dx: E11.9 1 each 2  . Blood Glucose Monitoring Suppl (TRUE METRIX AIR GLUCOSE METER) w/Device KIT USE  TO TEST BLOOD SUGAR THREE TIMES DAILY 1 kit 0  . cephALEXin (KEFLEX) 250 MG capsule Take by mouth daily. ONCE DAILY AT BEDTIME    . cholecalciferol (VITAMIN D) 1000 UNITS tablet Take 1,000 Units by mouth daily.    . ciprofloxacin (CIPRO) 500 MG tablet Take 1 tablet (500 mg total) by mouth 2 (two) times daily. 14 tablet 0  . clopidogrel (PLAVIX) 75 MG tablet Take 1 tablet (75 mg total) by mouth daily. 90 tablet 3  . gabapentin (NEURONTIN) 100 MG capsule Take 1 capsule (100 mg total) by mouth 2 (two) times daily. 180 capsule 3  . gemfibrozil (LOPID) 600 MG tablet TAKE 1 TABLET (600 MG TOTAL) BY MOUTH 2 (TWO) TIMES DAILY BEFORE A MEAL. 180 tablet 3  . Insulin Glargine (LANTUS SOLOSTAR) 100 UNIT/ML Solostar Pen INJECT  60 UNITS SUBCUTANEOUSLY IN THE MORNING 60 mL 3  . Insulin Pen Needle 32G X 4 MM MISC Used to inject insulin 1x daily. 100 each 4  . Insulin Syringe-Needle U-100 (INSULIN SYRINGE 1CC/30GX5/16") 30G X 5/16" 1 ML MISC Use to inject insulin 1 time per day. 100 each 2  . levocetirizine (XYZAL) 5 MG tablet TAKE 1 TABLET BY MOUTH EVERY DAY IN THE EVENING 90 tablet 1  . metFORMIN (GLUCOPHAGE) 1000 MG tablet TAKE 1 TABLET (500 MG TOTAL) BY MOUTH 2 (TWO) TIMES DAILY WITH A MEAL. 180 tablet 1  . Multiple Vitamin (MULTIVITAMIN) tablet Take 0.5 tablets by mouth 2 (two) times daily.     . niacin (NIASPAN) 500 MG CR tablet  Take 1 tablet (500 mg total) by mouth at bedtime. 30 tablet 3  . Omega-3 Fatty Acids (FISH OIL) 1000 MG CAPS Take by mouth 2 (two) times daily.    . pantoprazole (PROTONIX) 40 MG tablet  TAKE 1 TABLET BY MOUTH EVERY DAY ON AN EMPTY STOMACH 90 tablet 1  . potassium chloride SA (KLOR-CON M20) 20 MEQ tablet Take 1 tablet (20 mEq total) by mouth daily. 90 tablet 3  . TRUE METRIX BLOOD GLUCOSE TEST test strip CHECK BLOOD SUGAR THREE TIMES DAILY 300 each 12   No current facility-administered medications on file prior to visit.     Review of Systems:  As per HPI- otherwise negative.    Physical Examination: Vitals:   09/16/18 1301  BP: 132/70  Pulse: 89  Resp: 18  Temp: 97.8 F (36.6 C)  SpO2: 92%   Vitals:   09/16/18 1301  Weight: 181 lb (82.1 kg)  Height: 5' 4"  (1.626 m)   Body mass index is 31.07 kg/m. Ideal Body Weight: Weight in (lb) to have BMI = 25: 145.3  GEN: WDWN, NAD, Non-toxic, A & O x 3, looks well, obese  HEENT: Atraumatic, Normocephalic. Neck supple. No masses, No LAD. Ears and Nose: No external deformity. CV: RRR, No M/G/R. No JVD. No thrill. No extra heart sounds. PULM: CTA B, no wheezes, crackles, rhonchi. No retractions. No resp. distress. No accessory muscle use. EXTR: No c/c/e NEURO Normal gait.  PSYCH: Normally interactive. Conversant. Not depressed or anxious appearing.  Calm demeanor.    Assessment and Plan: Dyslipidemia  Essential hypertension, benign  Type 2 diabetes mellitus with complication, without long-term current use of insulin ([place]) - Plan: liraglutide (VICTOZA) 18 MG/3ML SOPN  Acute cystitis without hematuria - Plan: Urine Culture  Following up today She is on lopid for her lipids - not able to tolerate statins  Her last A1c was quite high.  However she has been of her lantus for some weeks.  She would like to use a secondary medication which may reduce her dependence on insulin and help her weight.  Will rx victoza for her- we did  have some sample pens that I was able to give her today  She will continue to use her insulin for a week or so, and then will start on victoza.  She will reduce her insulin dose when she starts victoza if her glucose is approaching goal.  Urged her to contact me if any concerns about her blood sugar going too low, and to err on the side of less medication to avoid any lows  She will see me in 2 months for a recheck     Signed Lamar Blinks, MD

## 2018-09-16 ENCOUNTER — Encounter: Payer: Self-pay | Admitting: Family Medicine

## 2018-09-16 ENCOUNTER — Ambulatory Visit (INDEPENDENT_AMBULATORY_CARE_PROVIDER_SITE_OTHER): Payer: Medicare HMO | Admitting: Family Medicine

## 2018-09-16 VITALS — BP 132/70 | HR 89 | Temp 97.8°F | Resp 18 | Ht 64.0 in | Wt 181.0 lb

## 2018-09-16 DIAGNOSIS — E118 Type 2 diabetes mellitus with unspecified complications: Secondary | ICD-10-CM

## 2018-09-16 DIAGNOSIS — I10 Essential (primary) hypertension: Secondary | ICD-10-CM | POA: Diagnosis not present

## 2018-09-16 DIAGNOSIS — E785 Hyperlipidemia, unspecified: Secondary | ICD-10-CM | POA: Diagnosis not present

## 2018-09-16 DIAGNOSIS — N3000 Acute cystitis without hematuria: Secondary | ICD-10-CM | POA: Diagnosis not present

## 2018-09-16 MED ORDER — LIRAGLUTIDE 18 MG/3ML ~~LOC~~ SOPN: PEN_INJECTOR | SUBCUTANEOUS | 3 refills | Status: DC

## 2018-09-16 NOTE — Patient Instructions (Addendum)
I will be in touch with your repeat urine culture result, hopefully your infection is resolved now   Continue on lantus for a week or so prior to adding Victoza.  If you are getting glucose readings lower than 150- 200 drop the lantus dose by 20 units prior to starting Victoza  Use Victoza once a day- start with 0.6 mg daily for one week, then increase to 1.2 mg daily I gave you samples to use today, fill the rx if you are tolerting it ok  Please keep me posted regarding your blood sugar.  Let's visit in 2 months

## 2018-09-17 ENCOUNTER — Telehealth: Payer: Self-pay

## 2018-09-17 DIAGNOSIS — E118 Type 2 diabetes mellitus with unspecified complications: Secondary | ICD-10-CM

## 2018-09-17 LAB — URINE CULTURE
MICRO NUMBER: 91232118
SPECIMEN QUALITY:: ADEQUATE

## 2018-09-17 NOTE — Telephone Encounter (Signed)
Copied from [place] [id number]. Topic: General - Inquiry >> Sep 17, 2018  9:02 AM Conception Chancy, NT wrote: Reason for CRM: patient is requesting Dr. Lorelei Pont give her a call when she returns to the office tomorrow. She said they spoke about the wrong medicine yesterday.

## 2018-09-18 MED ORDER — INSULIN DEGLUDEC 100 UNIT/ML ~~LOC~~ SOPN: PEN_INJECTOR | SUBCUTANEOUS | 6 refills | Status: DC

## 2018-09-18 NOTE — Telephone Encounter (Signed)
Called her back but no answer- will try again  Called again and [facility]  Called and reached her- her insurance company is no longer covering lantus and she needs to change to [place]  We have some samples of this that she can use to get started Will also send in rx for [place] for her Her current dose is 60 units and we can convert 1:1

## 2018-10-01 DIAGNOSIS — I714 Abdominal aortic aneurysm, without rupture: Secondary | ICD-10-CM | POA: Diagnosis not present

## 2018-10-01 DIAGNOSIS — Z48812 Encounter for surgical aftercare following surgery on the circulatory system: Secondary | ICD-10-CM | POA: Diagnosis not present

## 2018-10-02 ENCOUNTER — Encounter: Payer: Self-pay | Admitting: Family Medicine

## 2018-10-18 ENCOUNTER — Encounter (HOSPITAL_COMMUNITY): Payer: Self-pay

## 2018-10-18 ENCOUNTER — Ambulatory Visit: Payer: Self-pay | Admitting: *Deleted

## 2018-10-18 ENCOUNTER — Emergency Department (HOSPITAL_COMMUNITY)
Admission: EM | Admit: 2018-10-18 | Discharge: 2018-10-18 | Disposition: A | Discharge status: Home / Self Care | Payer: Medicare HMO | Attending: Emergency Medicine | Admitting: Emergency Medicine

## 2018-10-18 ENCOUNTER — Other Ambulatory Visit: Payer: Self-pay

## 2018-10-18 DIAGNOSIS — Z853 Personal history of malignant neoplasm of breast: Secondary | ICD-10-CM | POA: Diagnosis not present

## 2018-10-18 DIAGNOSIS — Z79899 Other long term (current) drug therapy: Secondary | ICD-10-CM | POA: Insufficient documentation

## 2018-10-18 DIAGNOSIS — Z8541 Personal history of malignant neoplasm of cervix uteri: Secondary | ICD-10-CM | POA: Insufficient documentation

## 2018-10-18 DIAGNOSIS — Z87891 Personal history of nicotine dependence: Secondary | ICD-10-CM | POA: Diagnosis not present

## 2018-10-18 DIAGNOSIS — Z794 Long term (current) use of insulin: Secondary | ICD-10-CM | POA: Insufficient documentation

## 2018-10-18 DIAGNOSIS — E1122 Type 2 diabetes mellitus with diabetic chronic kidney disease: Secondary | ICD-10-CM | POA: Insufficient documentation

## 2018-10-18 DIAGNOSIS — N189 Chronic kidney disease, unspecified: Secondary | ICD-10-CM | POA: Insufficient documentation

## 2018-10-18 DIAGNOSIS — Z7902 Long term (current) use of antithrombotics/antiplatelets: Secondary | ICD-10-CM | POA: Diagnosis not present

## 2018-10-18 DIAGNOSIS — R040 Epistaxis: Secondary | ICD-10-CM | POA: Insufficient documentation

## 2018-10-18 DIAGNOSIS — E1140 Type 2 diabetes mellitus with diabetic neuropathy, unspecified: Secondary | ICD-10-CM | POA: Diagnosis not present

## 2018-10-18 MED ORDER — OXYMETAZOLINE HCL 0.05 % NA SOLN
1.0000 | Freq: Once | NASAL | Status: AC
  Administered 2018-10-18: 1 via NASAL
  Filled 2018-10-18: qty 15

## 2018-10-18 MED ORDER — SALINE SPRAY 0.65 % NA SOLN: 1.0000 | NASAL | 0 refills | Status: DC | PRN

## 2018-10-18 NOTE — ED Provider Notes (Signed)
[facility] EMERGENCY DEPARTMENT Provider Note   CSN: [id number] Arrival date & time: 10/18/18  1108     History   Chief Complaint Chief Complaint  Patient presents with  . Epistaxis    HPI Mandy Kim is a 72 y.o. female.  The history is provided by the patient and the spouse. No language interpreter was used.  Epistaxis     Mandy Kim is a 72 y.o. female who presents to the Emergency Department complaining of epistaxis. Since to the emergency department complaining of epistaxis out of her right near. Bleeding started about 230 this morning. She is having a slight bruise from her right nose that she dabs with a tissue. She also feels bleeding down the back of her throat. She has a history of similar episodes in the past. She has multiple medical issues and takes Plavix daily. She reports recent illness with upper respiratory infection with nasal congestion and cough, currently improving. Denies any difficulty breathing, chest pain. Past Medical History:  Diagnosis Date  . Allergy   . Cancer ([place])   . Cataract   . Chronic kidney disease   . Diabetes mellitus   . Diverticulitis   . GERD (gastroesophageal reflux disease)   . Hyperlipidemia   . Sleep apnea    has c pap machine    Patient Active Problem List   Diagnosis Date Noted  . Hypoxia   . Sepsis (Mandy Kim) 05/28/2017  . Elevated LFTs 05/28/2017  . Acute lower UTI 05/28/2017  . Headache 05/28/2017  . Dehydration 05/28/2017  . Peripheral neuropathy 07/27/2014  . Other and unspecified hyperlipidemia 02/12/2012  . AAA (abdominal aortic aneurysm) ([place]) 02/12/2012  . Fatty liver 02/12/2012  . Breast cancer ([place]) 02/12/2012  . Cervical cancer ([place]) 02/12/2012  . Diabetes mellitus ([place]) 02/12/2012  . Hyperlipidemia 02/12/2012    Past Surgical History:  Procedure Laterality Date  . ABDOMINAL AORTIC ANEURYSM REPAIR    . ABDOMINAL AORTIC ANEURYSM REPAIR    . ABDOMINAL HYSTERECTOMY  2000  .  Bladder tacking    . BREAST SURGERY    . CATARACT EXTRACTION, BILATERAL    . EYE SURGERY    . MASTECTOMY    . TONSILLECTOMY       OB History   None      Home Medications    Prior to Admission medications   Medication Sig Start Date End Date Taking? Authorizing Provider  b complex vitamins tablet Take 0.5 tablets by mouth 2 (two) times daily.    Yes [provider]  Biotin 1000 MCG tablet Take 1,000 mcg by mouth daily.   Yes [provider]  cephALEXin (KEFLEX) 250 MG capsule Take by mouth daily. ONCE DAILY AT BEDTIME   Yes [provider]  cholecalciferol (VITAMIN D) 1000 UNITS tablet Take 1,000 Units by mouth daily.   Yes [provider]  clopidogrel (PLAVIX) 75 MG tablet Take 1 tablet (75 mg total) by mouth daily. 03/11/18  Yes Copland, Gay Filler, MD  gabapentin (NEURONTIN) 100 MG capsule Take 1 capsule (100 mg total) by mouth 2 (two) times daily. 03/11/18  Yes Copland, Gay Filler, MD  gemfibrozil (LOPID) 600 MG tablet TAKE 1 TABLET (600 MG TOTAL) BY MOUTH 2 (TWO) TIMES DAILY BEFORE A MEAL. 03/11/18  Yes Copland, Gay Filler, MD  insulin degludec (TRESIBA FLEXTOUCH) 100 UNIT/ML SOPN FlexTouch Pen 60 units subque daily Patient taking differently: Inject 47 Units into the skin at bedtime. 60 units subque daily 09/18/18  Yes Copland, Gay Filler, MD  levocetirizine (XYZAL) 5 MG tablet TAKE 1 TABLET BY MOUTH EVERY DAY IN THE EVENING Patient taking differently: Take 5 mg by mouth as needed for allergies. TAKE 1 TABLET BY MOUTH EVERY DAY IN THE EVENING 12/12/17  Yes Roma Schanz R, DO  liraglutide (VICTOZA) 18 MG/3ML SOPN Take 0.6 mg [place] daily for one week, then increase to 1.2 mg daily 09/16/18  Yes Copland, Gay Filler, MD  magnesium oxide (MAG-OX) 400 MG tablet Take 200 mg by mouth daily.   Yes [provider]  Menthol, Topical Analgesic, (BIOFREEZE ROLL-ON EX) Apply 1 application topically as needed (joint pain).   Yes [provider]    metFORMIN (GLUCOPHAGE) 1000 MG tablet TAKE 1 TABLET (500 MG TOTAL) BY MOUTH 2 (TWO) TIMES DAILY WITH A MEAL. Patient taking differently: Take 1,000 mg by mouth 2 (two) times daily with a meal.  08/19/18  Yes Copland, Gay Filler, MD  Multiple Vitamin (MULTIVITAMIN) tablet Take 0.5 tablets by mouth 2 (two) times daily.    Yes [provider]  niacin (NIASPAN) 500 MG CR tablet Take 1 tablet (500 mg total) by mouth at bedtime. 08/25/15  Yes Copland, Gay Filler, MD  Omega-3 Fatty Acids (FISH OIL) 1000 MG CAPS Take 2 capsules by mouth 2 (two) times daily.    Yes [provider]  pantoprazole (PROTONIX) 40 MG tablet TAKE 1 TABLET BY MOUTH EVERY DAY ON AN EMPTY STOMACH Patient taking differently: Take 40 mg by mouth daily. TAKE 1 TABLET BY MOUTH EVERY DAY ON AN EMPTY STOMACH 08/06/18  Yes Copland, Gay Filler, MD  potassium chloride SA (KLOR-CON M20) 20 MEQ tablet Take 1 tablet (20 mEq total) by mouth daily. 03/11/18  Yes Copland, Gay Filler, MD  Alcohol Swabs PADS Use to test blood sugar 3 times daily. Dx: E11.9 05/22/18   Saguier, Percell Miller, PA-C  Blood Glucose Calibration (GLUCOSE CONTROL) SOLN Use to test blood sugar 3 times daily. Dx: E11.9 12/20/15   Copland, Gay Filler, MD  Blood Glucose Monitoring Suppl (TRUE METRIX AIR GLUCOSE METER) w/Device KIT USE  TO TEST BLOOD SUGAR THREE TIMES DAILY 02/15/16   Copland, Gay Filler, MD  ciprofloxacin (CIPRO) 500 MG tablet Take 1 tablet (500 mg total) by mouth 2 (two) times daily. Patient not taking: Reported on 10/18/2018 09/09/18   Copland, Gay Filler, MD  Insulin Pen Needle 32G X 4 MM MISC Used to inject insulin 1x daily. 10/18/17   Renato Shin, MD  Insulin Syringe-Needle U-100 (INSULIN SYRINGE 1CC/30GX5/16") 30G X 5/16" 1 ML MISC Use to inject insulin 1 time per day. 05/22/17   Renato Shin, MD  sodium chloride (OCEAN) 0.65 % SOLN nasal spray Place 1 spray into both nostrils as needed for congestion. 10/18/18   Quintella Reichert, MD  TRUE METRIX BLOOD GLUCOSE  TEST test strip CHECK BLOOD SUGAR THREE TIMES DAILY 06/29/17   Copland, Gay Filler, MD    Family History Family History  Problem Relation Age of Onset  . Cancer Mother   . Pneumonia Mother   . Thyroid disease Mother   . Thyroid disease Sister   . Obesity Daughter   . Hypertension Daughter   . Thyroid disease Son   . Pneumonia Maternal Grandmother   . Heart attack Maternal Grandfather   . Diabetes Neg Hx     Social History Social History   Tobacco Use  . Smoking status: Former Smoker    Years: 30.00    Types: Cigarettes  Last attempt to quit: 06/07/1999    Years since quitting: 19.3  . Smokeless tobacco: Never Used  Substance Use Topics  . Alcohol use: Never    Frequency: Never  . Drug use: Never     Allergies   Aspirin; Bee venom; Crestor [rosuvastatin calcium]; Ibuprofen; Lipitor [atorvastatin calcium]; Poison oak extract; Pravastatin; Simvastatin; Trulicity [dulaglutide]; and Tylenol [acetaminophen]   Review of Systems Review of Systems  HENT: Positive for nosebleeds.   All other systems reviewed and are negative.    Physical Exam Updated Vital Signs Ht _0  (1.626 m)   Wt 83.9 kg   BMI 31.76 kg/m   Physical Exam  Constitutional: She is oriented to person, place, and time. She appears well-developed and well-nourished.  HENT:  Head: Normocephalic and atraumatic.  Small area of slight active bleeding on the right anterior septum. Small amount of blood in the posterior oropharynx.  Cardiovascular: Normal rate and regular rhythm.  No murmur heard. Pulmonary/Chest: Effort normal and breath sounds normal. No respiratory distress.  Abdominal: Soft. There is no tenderness. There is no rebound and no guarding.  Musculoskeletal: She exhibits no edema or tenderness.  Neurological: She is alert and oriented to person, place, and time.  Skin: Skin is warm and dry.  Psychiatric: She has a normal mood and affect. Her behavior is normal.  Nursing note and vitals  reviewed.    ED Treatments / Results  Labs (all labs ordered are listed, but only abnormal results are displayed) Labs Reviewed - No data to display  EKG None  Radiology No results found.  Procedures .Epistaxis Management Date/Time: 10/18/2018 11:39 AM Performed by: Quintella Reichert, MD Authorized by: Quintella Reichert, MD   Consent:    Consent obtained:  Verbal   Consent given by:  Patient   Risks discussed:  Bleeding, nasal injury and pain Anesthesia (see MAR for exact dosages):    Anesthesia method:  None Procedure details:    Treatment site:  R anterior   Treatment method:  Silver nitrate   Treatment complexity:  Limited   Treatment episode: initial   Post-procedure details:    Assessment:  Bleeding stopped   Patient tolerance of procedure:  Tolerated well, no immediate complications   (including critical care time)  Medications Ordered in ED Medications  oxymetazoline (AFRIN) 0.05 % nasal spray 1 spray (1 spray Each Nare Given 10/18/18 1145)     Initial Impression / Assessment and Plan / ED Course  I have reviewed the triage vital signs and the nursing notes.  Pertinent labs & imaging results that were available during my care of the patient were reviewed by me and considered in my medical decision making (see chart for details).     Patient with history of epistaxis, on Plavix here for evaluation of recurrent epistaxis. Bleeding was controlled in the department after silver nitrate cautery. She was observed for an additional hour without recurrent bleeding. Discussed with patient home care for epistaxis. Discussed outpatient follow-up and return precautions.  Final Clinical Impressions(s) / ED Diagnoses   Final diagnoses:  Anterior epistaxis    ED Discharge Orders         Ordered    sodium chloride (OCEAN) 0.65 % SOLN nasal spray  As needed     10/18/18 1232           Quintella Reichert, MD 10/18/18 1315

## 2018-10-18 NOTE — Telephone Encounter (Signed)
Pt called with a nosebleed that started about 2:30 this morning. She denies dizziness, and not bleeding very much. She also states that when she sneezes it starts back up. She has had her nose packed and then it was cauterized once before. She did not want to be triaged, just wanted to know where she could go to get her nose cauterized, at an emergency department. Advised patient of hospital ED availability of [facility]. She stated all EDs are not able to do cauterization. So [facility] was the next suggestion.  Pt voiced understanding.

## 2018-10-18 NOTE — ED Triage Notes (Signed)
Pt c/o nose bleed that started at 230 this morning. Pt presents to room with gauze in nose and bleeding controlled at this time. Pt is on Plavix.

## 2018-10-20 ENCOUNTER — Telehealth: Payer: Self-pay | Admitting: Medical

## 2018-10-20 NOTE — Telephone Encounter (Signed)
There is a drug therapy. I got from [place]. This is Dr. Lorelei Pont patient(I only saw her for urinary symptoms). They recommend she is on statin. But has statin side effects. She is diabetic. So not sure what Dr Lorelei Pont might recommend  I will bring over sheet. So she can review.

## 2018-10-21 NOTE — Telephone Encounter (Signed)
Form in provider's folder

## 2018-10-22 NOTE — Telephone Encounter (Signed)
Taken care of

## 2018-10-23 ENCOUNTER — Other Ambulatory Visit: Payer: Self-pay

## 2018-10-23 DIAGNOSIS — Z5181 Encounter for therapeutic drug level monitoring: Secondary | ICD-10-CM

## 2018-10-23 DIAGNOSIS — E118 Type 2 diabetes mellitus with unspecified complications: Secondary | ICD-10-CM

## 2018-10-23 DIAGNOSIS — K219 Gastro-esophageal reflux disease without esophagitis: Secondary | ICD-10-CM

## 2018-10-23 MED ORDER — PANTOPRAZOLE SODIUM 40 MG PO TBEC: 40.0000 mg | DELAYED_RELEASE_TABLET | Freq: Every day | ORAL | 1 refills | Status: DC

## 2018-10-23 MED ORDER — METFORMIN HCL 1000 MG PO TABS: 1000.0000 mg | ORAL_TABLET | Freq: Two times a day (BID) | ORAL | 1 refills | Status: DC

## 2018-10-23 MED ORDER — POTASSIUM CHLORIDE CRYS ER 20 MEQ PO TBCR: 20.0000 meq | EXTENDED_RELEASE_TABLET | Freq: Every day | ORAL | 1 refills | Status: DC

## 2018-10-24 ENCOUNTER — Other Ambulatory Visit: Payer: Self-pay

## 2018-10-24 MED ORDER — ACCU-CHEK AVIVA PLUS W/DEVICE KIT: PACK | 0 refills | Status: DC

## 2018-10-28 ENCOUNTER — Other Ambulatory Visit: Payer: Self-pay

## 2018-10-28 DIAGNOSIS — Z9889 Other specified postprocedural states: Secondary | ICD-10-CM

## 2018-10-28 DIAGNOSIS — E1142 Type 2 diabetes mellitus with diabetic polyneuropathy: Secondary | ICD-10-CM

## 2018-10-28 DIAGNOSIS — E785 Hyperlipidemia, unspecified: Secondary | ICD-10-CM

## 2018-10-28 MED ORDER — GABAPENTIN 100 MG PO CAPS: 100.0000 mg | ORAL_CAPSULE | Freq: Two times a day (BID) | ORAL | 3 refills | Status: DC

## 2018-10-28 MED ORDER — GEMFIBROZIL 600 MG PO TABS: ORAL_TABLET | ORAL | 3 refills | Status: DC

## 2018-10-28 MED ORDER — CLOPIDOGREL BISULFATE 75 MG PO TABS: 75.0000 mg | ORAL_TABLET | Freq: Every day | ORAL | 3 refills | Status: DC

## 2018-10-30 ENCOUNTER — Telehealth: Payer: Self-pay

## 2018-10-30 NOTE — Telephone Encounter (Signed)
Received fax from [facility] recommending statin rx per CMS guidelines for persons with diabetes. Pt's last lipid profile in 03/2018 showed elevated total cholesterol and triglycerides, pt. On lopid already per [facility]. Routed to PCP to advise.

## 2018-10-30 NOTE — Telephone Encounter (Signed)
Chief Strategy Officer received fax from [facility] requesting clarification on diabetic meter supply quantities. Author made pharmacy aware that pt. Is to test three times daily, and pharmacy registered 300 strips (90 day supply) with no refills.

## 2018-10-30 NOTE — Telephone Encounter (Signed)
I have advised them several times that pt has tried multiple statins and did not tolerate. Will tell them again

## 2018-11-16 NOTE — Progress Notes (Addendum)
[facility] [address] [place], [place] [address] [phone]  Date:  11/18/2018   Name:  Mandy Kim   DOB:  1946/02/05   MRN:  [id number]  PCP:  Darreld Mclean, MD    Chief Complaint: Dyslipidedmia (2 months follow up) and Diabetes   History of Present Illness:  Mandy Kim is a 72 y.o. very pleasant female patient who presents with the following:  They are traveling to [place] for the holidays  Short-term follow-up today.  Patient with history of diabetes, hyperlipidemia, breast and cervical cancers, AAA with repair done in 2012.  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  At that time her A1c was quite elevated, but she had been out of her Lantus.  We added Victoza at that time.  Need to check an A1c for today.  Lab Results  Component Value Date   HGBA1C 10.1 (H) 09/09/2018   She was in the ER about a month ago with a nosebleed. This has not occurred again She also saw her vascular surgery doctor in [place], Dr. Terrial Rhodes.  They followed up on her AAA All is stable here.    She notes that her glucose is doing much better She is using lantus 50 units Her glucose is down to mid 100 She did not end up using the victoza  She is not fasting today- she had a small snack.    Needs foot exam. Flu shot; declines today   she is taking niacin and gemfibrozil as well as 4 fish oil tabs a day. She cannot tolerate a statin   Patient Active Problem List   Diagnosis Date Noted  . Hypoxia   . Sepsis ([place]) 05/28/2017  . Elevated LFTs 05/28/2017  . Acute lower UTI 05/28/2017  . Headache 05/28/2017  . Dehydration 05/28/2017  . Peripheral neuropathy 07/27/2014  . Other and unspecified hyperlipidemia 02/12/2012  . AAA (abdominal aortic aneurysm) ([place]) 02/12/2012  . Fatty liver 02/12/2012  . Breast cancer ([place])  02/12/2012  . Cervical cancer ([place]) 02/12/2012  . Diabetes mellitus ([place]) 02/12/2012  . Hyperlipidemia 02/12/2012    Past Medical History:  Diagnosis Date  . Allergy   . Cancer ([place])   . Cataract   . Chronic kidney disease   . Diabetes mellitus   . Diverticulitis   . GERD (gastroesophageal reflux disease)   . Hyperlipidemia   . Sleep apnea    has c pap machine    Past Surgical History:  Procedure Laterality Date  . ABDOMINAL AORTIC ANEURYSM REPAIR    . ABDOMINAL AORTIC ANEURYSM REPAIR    . ABDOMINAL HYSTERECTOMY  2000  . Bladder tacking    . BREAST SURGERY    . CATARACT EXTRACTION, BILATERAL    . EYE SURGERY    . MASTECTOMY    . TONSILLECTOMY      Social History   Tobacco Use  . Smoking status: Former Smoker    Years: 30.00    Types: Cigarettes    Last attempt to quit: 06/07/1999    Years since quitting: 19.4  . Smokeless tobacco: Never Used  Substance Use Topics  . Alcohol use: Never    Frequency: Never  . Drug use: Never    Family History  Problem Relation Age of Onset  . Cancer Mother   .  Pneumonia Mother   . Thyroid disease Mother   . Thyroid disease Sister   . Obesity Daughter   . Hypertension Daughter   . Thyroid disease Son   . Pneumonia Maternal Grandmother   . Heart attack Maternal Grandfather   . Diabetes Neg Hx     Allergies  Allergen Reactions  . Aspirin     Per pt any pain medications  . Bee Venom Swelling  . Crestor [Rosuvastatin Calcium]   . Ibuprofen Swelling  . Lipitor [Atorvastatin Calcium]   . Poison Oak Extract Itching and Swelling  . Pravastatin   . Simvastatin   . Trulicity [Dulaglutide] Other (See Comments)    flatulance  . Tylenol [Acetaminophen] Swelling    Medication list has been reviewed and updated.  Current Outpatient Medications on File Prior to Visit  Medication Sig Dispense Refill  . Alcohol Swabs PADS Use to test blood sugar 3 times daily. Dx: E11.9 300 each 2  . b complex vitamins tablet Take 0.5  tablets by mouth 2 (two) times daily.     . Biotin 1000 MCG tablet Take 1,000 mcg by mouth daily.    . Blood Glucose Calibration (GLUCOSE CONTROL) SOLN Use to test blood sugar 3 times daily. Dx: E11.9 1 each 2  . Blood Glucose Monitoring Suppl (ACCU-CHEK AVIVA PLUS) w/Device KIT 1 accu chek aviva meter, 300 aviva plus strip, 300 softclix lancets 1 kit 0  . Blood Glucose Monitoring Suppl (TRUE METRIX AIR GLUCOSE METER) w/Device KIT USE  TO TEST BLOOD SUGAR THREE TIMES DAILY 1 kit 0  . cephALEXin (KEFLEX) 250 MG capsule Take by mouth daily. ONCE DAILY AT BEDTIME    . cholecalciferol (VITAMIN D) 1000 UNITS tablet Take 1,000 Units by mouth daily.    . ciprofloxacin (CIPRO) 500 MG tablet Take 1 tablet (500 mg total) by mouth 2 (two) times daily. 14 tablet 0  . clopidogrel (PLAVIX) 75 MG tablet Take 1 tablet (75 mg total) by mouth daily. 90 tablet 3  . gabapentin (NEURONTIN) 100 MG capsule Take 1 capsule (100 mg total) by mouth 2 (two) times daily. 180 capsule 3  . gemfibrozil (LOPID) 600 MG tablet TAKE 1 TABLET (600 MG TOTAL) BY MOUTH 2 (TWO) TIMES DAILY BEFORE A MEAL. 180 tablet 3  . insulin degludec (TRESIBA FLEXTOUCH) 100 UNIT/ML SOPN FlexTouch Pen 60 units subque daily (Patient taking differently: Inject 47 Units into the skin at bedtime. 60 units subque daily) 3 pen 6  . Insulin Pen Needle 32G X 4 MM MISC Used to inject insulin 1x daily. 100 each 4  . Insulin Syringe-Needle U-100 (INSULIN SYRINGE 1CC/30GX5/16") 30G X 5/16" 1 ML MISC Use to inject insulin 1 time per day. 100 each 2  . levocetirizine (XYZAL) 5 MG tablet TAKE 1 TABLET BY MOUTH EVERY DAY IN THE EVENING (Patient taking differently: Take 5 mg by mouth as needed for allergies. TAKE 1 TABLET BY MOUTH EVERY DAY IN THE EVENING) 90 tablet 1  . liraglutide (VICTOZA) 18 MG/3ML SOPN Take 0.6 mg Mandy Kim daily for one week, then increase to 1.2 mg daily 9 mL 3  . magnesium oxide (MAG-OX) 400 MG tablet Take 200 mg by mouth daily.    . Menthol, Topical  Analgesic, (BIOFREEZE ROLL-ON EX) Apply 1 application topically as needed (joint pain).    . metFORMIN (GLUCOPHAGE) 1000 MG tablet Take 1 tablet (1,000 mg total) by mouth 2 (two) times daily with a meal. 180 tablet 1  . Multiple Vitamin (MULTIVITAMIN) tablet Take  0.5 tablets by mouth 2 (two) times daily.     . niacin (NIASPAN) 500 MG CR tablet Take 1 tablet (500 mg total) by mouth at bedtime. 30 tablet 3  . Omega-3 Fatty Acids (FISH OIL) 1000 MG CAPS Take 2 capsules by mouth 2 (two) times daily.     . pantoprazole (PROTONIX) 40 MG tablet Take 1 tablet (40 mg total) by mouth daily. TAKE 1 TABLET BY MOUTH EVERY DAY ON AN EMPTY STOMACH 90 tablet 1  . potassium chloride SA (KLOR-CON M20) 20 MEQ tablet Take 1 tablet (20 mEq total) by mouth daily. 90 tablet 1  . sodium chloride (OCEAN) 0.65 % SOLN nasal spray Place 1 spray into both nostrils as needed for congestion. 1 Bottle 0  . TRUE METRIX BLOOD GLUCOSE TEST test strip CHECK BLOOD SUGAR THREE TIMES DAILY 300 each 12   No current facility-administered medications on file prior to visit.     Review of Systems:  As per HPI- otherwise negative. No fever or chills No Cp or SOB   Physical Examination: Vitals:   11/18/18 1257  BP: 136/70  Pulse: 82  Resp: 16  SpO2: 95%   Vitals:   11/18/18 1257  Weight: 179 lb (81.2 kg)  Height: 5' 4"  (1.626 m)   Body mass index is 30.73 kg/m. Ideal Body Weight: Weight in (lb) to have BMI = 25: 145.3  GEN: WDWN, NAD, Non-toxic, A & O x 3, overweight, looks well HEENT: Atraumatic, Normocephalic. Neck supple. No masses, No LAD. Ears and Nose: No external deformity. CV: RRR, No M/G/R. No JVD. No thrill. No extra heart sounds. PULM: CTA B, no wheezes, crackles, rhonchi. No retractions. No resp. distress. No accessory muscle use. ABD: S, NT, ND, +BS. No rebound. No HSM. EXTR: No c/c/e NEURO Normal gait.  PSYCH: Normally interactive. Conversant. Not depressed or anxious appearing.  Calm demeanor.  Foot  exam done today She has a small bunionette on the left foot, and a bunion on the right foot.  Assessment and Plan: Type 2 diabetes mellitus with complication, without long-term current use of insulin (HCC) - Plan: Hemoglobin M2N, Basic metabolic panel  Essential hypertension, benign - Plan: Basic metabolic panel  Dyslipidemia - Plan: Lipid panel  Following up on her diabetes and blood pressure today.  Await A1c will be in touch with her ASAP. She did not end up using the Victoza, but is using Lantus. Blood pressures under good control today. We will also check her lipids today Plan to check her back in about 4 months. Declines flu shot today  Signed Lamar Blinks, MD  Received her labs 12/20, called and spoke with her A1c is down from 10.1.  We will gradually increase her lantus by about 5 units for a goal of 55 units She is already on gemfibrozil and niaspan 500.  She does not want to increase niaspan due to SE We tried to get her on an rx omega 3 but they were too expensive.  Not able to tolerate statins Mandy Kim feels that this may be the best we can do with meds., she is going to try and lose weight.  Asked her to come in and see me in 4 months JC   Results for orders placed or performed in visit on 11/18/18  Hemoglobin A1c  Result Value Ref Range   Hgb A1c MFr Bld 8.6 (H) 4.6 - 6.5 %  Lipid panel  Result Value Ref Range   Cholesterol 202 (H) 0 -  200 mg/dL   Triglycerides (H) 0.0 - 149.0 mg/dL    559.0 Triglyceride is over 400; calculations on Lipids are invalid.   HDL 26.10 (L) >39.00 mg/dL   Total CHOL/HDL Ratio 8   Basic metabolic panel  Result Value Ref Range   Sodium 137 135 - 145 mEq/L   Potassium 4.2 3.5 - 5.1 mEq/L   Chloride 102 96 - 112 mEq/L   CO2 25 19 - 32 mEq/L   Glucose, Bld 126 (H) 70 - 99 mg/dL   BUN 26 (H) 6 - 23 mg/dL   Creatinine, Ser 0.89 0.40 - 1.20 mg/dL   Calcium 9.8 8.4 - 10.5 mg/dL   GFR 65.88 >60.00 mL/min  LDL cholesterol, direct   Result Value Ref Range   Direct LDL 102.0 mg/dL

## 2018-11-18 ENCOUNTER — Ambulatory Visit (INDEPENDENT_AMBULATORY_CARE_PROVIDER_SITE_OTHER): Payer: Medicare HMO | Admitting: Family Medicine

## 2018-11-18 ENCOUNTER — Encounter: Payer: Self-pay | Admitting: Family Medicine

## 2018-11-18 VITALS — BP 136/70 | HR 82 | Resp 16 | Ht 64.0 in | Wt 179.0 lb

## 2018-11-18 DIAGNOSIS — I10 Essential (primary) hypertension: Secondary | ICD-10-CM

## 2018-11-18 DIAGNOSIS — E785 Hyperlipidemia, unspecified: Secondary | ICD-10-CM

## 2018-11-18 DIAGNOSIS — E118 Type 2 diabetes mellitus with unspecified complications: Secondary | ICD-10-CM

## 2018-11-18 LAB — BASIC METABOLIC PANEL
BUN: 26 mg/dL — AB (ref 6–23)
CHLORIDE: 102 meq/L (ref 96–112)
CO2: 25 meq/L (ref 19–32)
Calcium: 9.8 mg/dL (ref 8.4–10.5)
Creatinine, Ser: 0.89 mg/dL (ref 0.40–1.20)
GFR: 65.88 mL/min (ref >60.00)
GLUCOSE: 126 mg/dL — AB (ref 70–99)
POTASSIUM: 4.2 meq/L (ref 3.5–5.1)
Sodium: 137 mEq/L (ref 135–145)

## 2018-11-18 LAB — LIPID PANEL
Cholesterol: 202 mg/dL — ABNORMAL HIGH (ref 0–200)
HDL: 26.1 mg/dL — ABNORMAL LOW (ref >39.00)
Total CHOL/HDL Ratio: 8

## 2018-11-18 LAB — HEMOGLOBIN A1C: Hgb A1c MFr Bld: 8.6 % — ABNORMAL HIGH (ref 4.6–6.5)

## 2018-11-18 LAB — LDL CHOLESTEROL, DIRECT: Direct LDL: 102 mg/dL

## 2018-11-18 NOTE — Patient Instructions (Signed)
A pleasure to see you as always!  Have a wonderful holiday and safe travels.  I will be in touch with your labs, we will see how your A1c has responded.  Blood pressure looks fine today. Certainly you can talk to podiatrist about your feet if you would like.  However I would not be in a hurry to have bunion surgery unless pain was persistent.  Continue to keep your calluses under control so they do not crack. Assuming your A1c is improved, let us plan to recheck in 4 months.

## 2018-12-16 DIAGNOSIS — N816 Rectocele: Secondary | ICD-10-CM | POA: Diagnosis not present

## 2018-12-16 DIAGNOSIS — N8111 Cystocele, midline: Secondary | ICD-10-CM | POA: Diagnosis not present

## 2018-12-16 DIAGNOSIS — N390 Urinary tract infection, site not specified: Secondary | ICD-10-CM | POA: Diagnosis not present

## 2018-12-26 DIAGNOSIS — H04123 Dry eye syndrome of bilateral lacrimal glands: Secondary | ICD-10-CM | POA: Diagnosis not present

## 2018-12-26 DIAGNOSIS — Z961 Presence of intraocular lens: Secondary | ICD-10-CM | POA: Diagnosis not present

## 2018-12-26 DIAGNOSIS — E119 Type 2 diabetes mellitus without complications: Secondary | ICD-10-CM | POA: Diagnosis not present

## 2018-12-26 DIAGNOSIS — H26493 Other secondary cataract, bilateral: Secondary | ICD-10-CM | POA: Diagnosis not present

## 2018-12-26 LAB — HM DIABETES EYE EXAM

## 2018-12-30 ENCOUNTER — Other Ambulatory Visit: Payer: Self-pay | Admitting: Family Medicine

## 2018-12-30 DIAGNOSIS — N8111 Cystocele, midline: Secondary | ICD-10-CM | POA: Diagnosis not present

## 2018-12-30 DIAGNOSIS — N816 Rectocele: Secondary | ICD-10-CM | POA: Diagnosis not present

## 2018-12-30 DIAGNOSIS — R3914 Feeling of incomplete bladder emptying: Secondary | ICD-10-CM | POA: Diagnosis not present

## 2018-12-30 DIAGNOSIS — N3941 Urge incontinence: Secondary | ICD-10-CM | POA: Diagnosis not present

## 2018-12-30 DIAGNOSIS — N3020 Other chronic cystitis without hematuria: Secondary | ICD-10-CM | POA: Diagnosis not present

## 2019-01-09 ENCOUNTER — Other Ambulatory Visit: Payer: Self-pay | Admitting: Medical

## 2019-01-09 DIAGNOSIS — H26492 Other secondary cataract, left eye: Secondary | ICD-10-CM | POA: Diagnosis not present

## 2019-01-14 ENCOUNTER — Other Ambulatory Visit: Payer: Self-pay

## 2019-01-14 MED ORDER — ALCOHOL SWABS PADS: MEDICATED_PAD | 2 refills | Status: AC

## 2019-01-17 ENCOUNTER — Other Ambulatory Visit: Payer: Self-pay | Admitting: Urology

## 2019-01-20 ENCOUNTER — Telehealth: Payer: Self-pay | Admitting: *Deleted

## 2019-01-20 NOTE — Telephone Encounter (Signed)
Received Medical/Surgical Clearance Form from [facility] - Dr. Louis Meckel; forwarded to provider/SLS 02/17

## 2019-01-21 ENCOUNTER — Telehealth: Payer: Self-pay

## 2019-01-21 ENCOUNTER — Telehealth: Payer: Self-pay | Admitting: Family Medicine

## 2019-01-21 NOTE — Telephone Encounter (Signed)
-----   Message from Darreld Mclean, MD sent at 01/21/2019  9:31 AM EST ----- Please get Mandy Kim scheduled for a pre-op visit per urology Thank you!

## 2019-01-21 NOTE — Telephone Encounter (Signed)
Patient states "she is eating right now" and will call back to schedule appointment.

## 2019-01-21 NOTE — Telephone Encounter (Signed)
Patient came into the office today stating she received a call to come in for a pre-op appointment. Patient was not on the schedule as Dr. Lorelei Pont is not in the office on Tuesdays. Patient was informed that we need to schedule an appointment not that she had an appointment. Patient was frustrated and left without scheduling.

## 2019-01-22 ENCOUNTER — Telehealth: Payer: Self-pay

## 2019-01-22 ENCOUNTER — Encounter: Payer: Self-pay | Admitting: Family Medicine

## 2019-01-22 ENCOUNTER — Ambulatory Visit (INDEPENDENT_AMBULATORY_CARE_PROVIDER_SITE_OTHER): Payer: Medicare HMO | Admitting: Family Medicine

## 2019-01-22 VITALS — BP 128/76 | HR 68 | Temp 98.1°F | Resp 16 | Ht 64.0 in | Wt 184.0 lb

## 2019-01-22 DIAGNOSIS — E118 Type 2 diabetes mellitus with unspecified complications: Secondary | ICD-10-CM

## 2019-01-22 DIAGNOSIS — Z01818 Encounter for other preprocedural examination: Secondary | ICD-10-CM

## 2019-01-22 DIAGNOSIS — I10 Essential (primary) hypertension: Secondary | ICD-10-CM

## 2019-01-22 DIAGNOSIS — E785 Hyperlipidemia, unspecified: Secondary | ICD-10-CM

## 2019-01-22 LAB — COMPREHENSIVE METABOLIC PANEL
ALT: 56 U/L — AB (ref 0–35)
AST: 43 U/L — ABNORMAL HIGH (ref 0–37)
Albumin: 4.6 g/dL (ref 3.5–5.2)
Alkaline Phosphatase: 47 U/L (ref 39–117)
BILIRUBIN TOTAL: 0.4 mg/dL (ref 0.2–1.2)
BUN: 25 mg/dL — ABNORMAL HIGH (ref 6–23)
CO2: 24 meq/L (ref 19–32)
Calcium: 9.5 mg/dL (ref 8.4–10.5)
Chloride: 100 mEq/L (ref 96–112)
Creatinine, Ser: 0.96 mg/dL (ref 0.40–1.20)
GFR: 56.77 mL/min — AB (ref >60.00)
Glucose, Bld: 257 mg/dL — ABNORMAL HIGH (ref 70–99)
POTASSIUM: 4.3 meq/L (ref 3.5–5.1)
Sodium: 136 mEq/L (ref 135–145)
Total Protein: 7 g/dL (ref 6.0–8.3)

## 2019-01-22 LAB — HEMOGLOBIN A1C: Hgb A1c MFr Bld: 8.5 % — ABNORMAL HIGH (ref 4.6–6.5)

## 2019-01-22 LAB — CBC
HEMATOCRIT: 39.6 % (ref 36.0–46.0)
HEMOGLOBIN: 13.7 g/dL (ref 12.0–15.0)
MCHC: 34.7 g/dL (ref 30.0–36.0)
MCV: 87.4 fl (ref 78.0–100.0)
PLATELETS: 242 10*3/uL (ref 150.0–400.0)
RBC: 4.53 Mil/uL (ref 3.87–5.11)
RDW: 15.7 % — ABNORMAL HIGH (ref 11.5–15.5)
WBC: 5.5 10*3/uL (ref 4.0–10.5)

## 2019-01-22 NOTE — Progress Notes (Addendum)
[place] at [facility] [address], [place], [place] [address] [phone]  Date:  01/22/2019   Name:  Mandy Kim   DOB:  [birth year]   MRN:  [id number]  PCP:  Darreld Mclean, MD    Chief Complaint: Surgical Clearance (bladder sling) and Discuss Medication (stopping plavix)   History of Present Illness:  Mandy Kim is a 73 y.o. very pleasant female patient who presents with the following:  Here today for a pre-op clearance She is going to have a procedure per urology, Dr. Louis Meckel He plans to operate on 3/25 She will be having a bladder sling in hopes of improving her urinary function and continence   Last seen here in December-  History of diabetes, hyperlipidemia, breast and cervical cancers.  She did have a AAA repair in 2012.  She had a follow-up visit with her vascular surgeons at [facility] in October They found that her graft was stable, with no significant increase in the AAA surrounding the graft  Her diabetes has been under variable control.  Most recent A1c was 8.6%.  This was down from just over 10%, and I asked her to increase her Lantus. Her glucose is running around 200 fasting  We have generally had a difficult time getting her diabetes under good control  There is a question about her Plavix- can she stop it for her operation?  I will touch base with her vascular doc who is Dr. Terrial Rhodes with [facility] [phone]  I called him and left a detailed message requesting his recommendations regarding her Plavix Await his call back  Foot exam is due- will take care of today  Eye exam; she reports that she saw her ophthalmologist recently and has some fluid removed from her eye.  She sees Dr. Katy Fitch. Records request sent today Can offer flu shot- she declines to have this done  Suggested Shingrix  Discussed her cardiovascular capacity She is  able to walk several miles per day at home- she does not have any SOB "unless I climb up that step hill"  She tries to get 10K steps per day   Accompanied today by her husband who contributes to the history  Patient Active Problem List   Diagnosis Date Noted  . Hypoxia   . Sepsis ([place]) 05/28/2017  . Elevated LFTs 05/28/2017  . Acute lower UTI 05/28/2017  . Headache 05/28/2017  . Dehydration 05/28/2017  . Peripheral neuropathy 07/27/2014  . Other and unspecified hyperlipidemia 02/12/2012  . AAA (abdominal aortic aneurysm) ([place]) 02/12/2012  . Fatty liver 02/12/2012  . Breast cancer ([place]) 02/12/2012  . Cervical cancer ([place]) 02/12/2012  . Diabetes mellitus ([place]) 02/12/2012  . Hyperlipidemia 02/12/2012    Past Medical History:  Diagnosis Date  . Allergy   . Cancer ([place])   . Cataract   . Chronic kidney disease   . Diabetes mellitus   . Diverticulitis   . GERD (gastroesophageal reflux disease)   . Hyperlipidemia   . Sleep apnea    has c pap machine    Past Surgical History:  Procedure Laterality Date  . ABDOMINAL AORTIC ANEURYSM REPAIR    . ABDOMINAL AORTIC ANEURYSM REPAIR    . ABDOMINAL HYSTERECTOMY  2000  . Bladder tacking    . BREAST SURGERY    . CATARACT EXTRACTION, BILATERAL    . EYE SURGERY    . MASTECTOMY    .  TONSILLECTOMY     Mastectomy - one in 2003, one in 2004 Social History   Tobacco Use  . Smoking status: Former Smoker    Years: 30.00    Types: Cigarettes    Last attempt to quit: 06/07/1999    Years since quitting: 19.6  . Smokeless tobacco: Never Used  Substance Use Topics  . Alcohol use: Never    Frequency: Never  . Drug use: Never    Family History  Problem Relation Age of Onset  . Cancer Mother   . Pneumonia Mother   . Thyroid disease Mother   . Thyroid disease Sister   . Obesity Daughter   . Hypertension Daughter   . Thyroid disease Son   . Pneumonia Maternal Grandmother   . Heart attack Maternal Grandfather   . Diabetes Neg Hx      Allergies  Allergen Reactions  . Aspirin     Per pt any pain medications  . Bee Venom Swelling  . Crestor [Rosuvastatin Calcium]   . Ibuprofen Swelling  . Lipitor [Atorvastatin Calcium]   . Poison Oak Extract Itching and Swelling  . Pravastatin   . Simvastatin   . Trulicity [Dulaglutide] Other (See Comments)    flatulance  . Tylenol [Acetaminophen] Swelling    Medication list has been reviewed and updated.  Current Outpatient Medications on File Prior to Visit  Medication Sig Dispense Refill  . ACCU-CHEK SOFTCLIX LANCETS lancets TEST BLOOD SUGAR THREE TIMES DAILY 300 each 1  . Alcohol Swabs (B-D SINGLE USE SWABS REGULAR) PADS USE TO TEST BLOOD SUGAR 3 TIMES DAILY. 300 each 2  . Alcohol Swabs PADS Use to test blood sugar 3 times daily. Dx: E11.9 300 each 2  . b complex vitamins tablet Take 0.5 tablets by mouth 2 (two) times daily.     . Biotin 1000 MCG tablet Take 1,000 mcg by mouth daily.    . Blood Glucose Calibration (GLUCOSE CONTROL) SOLN Use to test blood sugar 3 times daily. Dx: E11.9 1 each 2  . Blood Glucose Monitoring Suppl (ACCU-CHEK AVIVA PLUS) w/Device KIT USE AS DIRECTED  TO TEST BLOOD GLUCOSE 1 kit 0  . Blood Glucose Monitoring Suppl (TRUE METRIX AIR GLUCOSE METER) w/Device KIT USE  TO TEST BLOOD SUGAR THREE TIMES DAILY 1 kit 0  . cephALEXin (KEFLEX) 250 MG capsule Take by mouth daily. ONCE DAILY AT BEDTIME    . cholecalciferol (VITAMIN D) 1000 UNITS tablet Take 1,000 Units by mouth daily.    . ciprofloxacin (CIPRO) 500 MG tablet Take 1 tablet (500 mg total) by mouth 2 (two) times daily. 14 tablet 0  . clopidogrel (PLAVIX) 75 MG tablet Take 1 tablet (75 mg total) by mouth daily. 90 tablet 3  . gabapentin (NEURONTIN) 100 MG capsule Take 1 capsule (100 mg total) by mouth 2 (two) times daily. 180 capsule 3  . gemfibrozil (LOPID) 600 MG tablet TAKE 1 TABLET (600 MG TOTAL) BY MOUTH 2 (TWO) TIMES DAILY BEFORE A MEAL. 180 tablet 3  . glucose blood (TRUE METRIX BLOOD  GLUCOSE TEST) test strip TEST BLOOD SUGAR THREE TIMES DAILY 300 each 12  . insulin degludec (TRESIBA FLEXTOUCH) 100 UNIT/ML SOPN FlexTouch Pen 60 units subque daily (Patient taking differently: Inject 47 Units into the skin at bedtime. 60 units subque daily) 3 pen 6  . Insulin Pen Needle 32G X 4 MM MISC Used to inject insulin 1x daily. 100 each 4  . Insulin Syringe-Needle U-100 (INSULIN SYRINGE 1CC/30GX5/16") 30G X 5/16" 1  ML MISC Use to inject insulin 1 time per day. 100 each 2  . levocetirizine (XYZAL) 5 MG tablet TAKE 1 TABLET BY MOUTH EVERY DAY IN THE EVENING (Patient taking differently: Take 5 mg by mouth as needed for allergies. TAKE 1 TABLET BY MOUTH EVERY DAY IN THE EVENING) 90 tablet 1  . liraglutide (VICTOZA) 18 MG/3ML SOPN Take 0.6 mg [place] daily for one week, then increase to 1.2 mg daily 9 mL 3  . magnesium oxide (MAG-OX) 400 MG tablet Take 200 mg by mouth daily.    . Menthol, Topical Analgesic, (BIOFREEZE ROLL-ON EX) Apply 1 application topically as needed (joint pain).    . metFORMIN (GLUCOPHAGE) 1000 MG tablet Take 1 tablet (1,000 mg total) by mouth 2 (two) times daily with a meal. 180 tablet 1  . Multiple Vitamin (MULTIVITAMIN) tablet Take 0.5 tablets by mouth 2 (two) times daily.     . niacin (NIASPAN) 500 MG CR tablet Take 1 tablet (500 mg total) by mouth at bedtime. 30 tablet 3  . Omega-3 Fatty Acids (FISH OIL) 1000 MG CAPS Take 2 capsules by mouth 2 (two) times daily.     . pantoprazole (PROTONIX) 40 MG tablet Take 1 tablet (40 mg total) by mouth daily. TAKE 1 TABLET BY MOUTH EVERY DAY ON AN EMPTY STOMACH 90 tablet 1  . potassium chloride SA (KLOR-CON M20) 20 MEQ tablet Take 1 tablet (20 mEq total) by mouth daily. 90 tablet 1  . sodium chloride (OCEAN) 0.65 % SOLN nasal spray Place 1 spray into both nostrils as needed for congestion. 1 Bottle 0   No current facility-administered medications on file prior to visit.     Review of Systems:  As per HPI- otherwise negative. No  fever or chills  Physical Examination: Vitals:   01/22/19 0900  BP: 128/76  Pulse: 68  Resp: 16  Temp: 98.1 F (36.7 C)  SpO2: 92%   Vitals:   01/22/19 0900  Weight: 184 lb (83.5 kg)  Height: 5' 4"  (1.749 m)   Body mass index is 31.58 kg/m. Ideal Body Weight: Weight in (lb) to have BMI = 25: 145.3  GEN: WDWN, NAD, Non-toxic, A & O x 3,, looks well, obese HEENT: Atraumatic, Normocephalic. Neck supple. No masses, No LAD. Ears and Nose: No external deformity. CV: RRR, No M/G/R. No JVD. No thrill. No extra heart sounds. PULM: CTA B, no wheezes, crackles, rhonchi. No retractions. No resp. distress. No accessory muscle use. ABD: S, NT, ND, +BS. No rebound. No HSM. EXTR: No c/c/e NEURO Normal gait.  PSYCH: Normally interactive. Conversant. Not depressed or anxious appearing.  Calm demeanor.  Foot exam done today Status post bilateral mastectomy  EKG within normal limits today.  Rate of 65, no ST changes Compared with previous tracing from 2018, no significant change noted Assessment and Plan: Pre-op evaluation - Plan: EKG 12-Lead, Exercise Tolerance Test  Essential hypertension, benign - Plan: CBC, Comprehensive metabolic panel  Type 2 diabetes mellitus with complication, without long-term current use of insulin (HCC) - Plan: Comprehensive metabolic panel, Hemoglobin A1c  Dyslipidemia  Here today for preop evaluation.  Urology plans to do a bladder sling of some sort in about 1 month.  Ago Mandy Kim has good exercise tolerance, she is 66 and has several risk factors.  I would like to get a treadmill test for further risk replication prior to clearing her I have also queried her vascular surgeon regarding her Plavix We will check her A1c today She has  poorly controlled dyslipidemia/high tri, but is not able to take statins We might think about repatha for her- I am going to contact our rep Mandy Kim reports that she had Zostavax about 10 years ago, she is not interested in getting  Shingrix  Signed Lamar Blinks, MD  Received her labs 2/23: Results for orders placed or performed in visit on 01/22/19  CBC  Result Value Ref Range   WBC 5.5 4.0 - 10.5 K/uL   RBC 4.53 3.87 - 5.11 Mil/uL   Platelets 242.0 150.0 - 400.0 K/uL   Hemoglobin 13.7 12.0 - 15.0 g/dL   HCT 39.6 36.0 - 46.0 %   MCV 87.4 78.0 - 100.0 fl   MCHC 34.7 30.0 - 36.0 g/dL   RDW 15.7 (H) 11.5 - 15.5 %  Comprehensive metabolic panel  Result Value Ref Range   Sodium 136 135 - 145 mEq/L   Potassium 4.3 3.5 - 5.1 mEq/L   Chloride 100 96 - 112 mEq/L   CO2 24 19 - 32 mEq/L   Glucose, Bld 257 (H) 70 - 99 mg/dL   BUN 25 (H) 6 - 23 mg/dL   Creatinine, Ser 0.96 0.40 - 1.20 mg/dL   Total Bilirubin 0.4 0.2 - 1.2 mg/dL   Alkaline Phosphatase 47 39 - 117 U/L   AST 43 (H) 0 - 37 U/L   ALT 56 (H) 0 - 35 U/L   Total Protein 7.0 6.0 - 8.3 g/dL   Albumin 4.6 3.5 - 5.2 g/dL   Calcium 9.5 8.4 - 10.5 mg/dL   GFR 56.77 (L) >60.00 mL/min  Hemoglobin A1c  Result Value Ref Range   Hgb A1c MFr Bld 8.5 (H) 4.6 - 6.5 %   CBC ok Renal function ok, LFTS stable  History of fatty liver, [place] 4/18 Negative hep panel 2017  A1c is down from 10.9 2018, still above goal  DM meds tresiba  victoza Metformin max dose Could not tolerate trulicity   Would like to add glipizide Also ask if she would be interested in [place] her on 2/23 but did not reach, LMOM  addnd 2/24-  Called and did not reach I did however hear form her vascular surgery doctor ([facility])- ok to hold plavix for 5 days for her operation    Called 2/25 and reached her She has a stress scheduled tomorrow She is actually NOT taking victoza.  Will have her add this- went over how to use it  She will come in for and A1c in 3 months as usual

## 2019-01-22 NOTE — Telephone Encounter (Signed)
Patient has appointment today at 9:00

## 2019-01-22 NOTE — Telephone Encounter (Signed)
Dr. Lorelei Pont sent me a staff message asking to schedule her an appointment. When I called yesterday morning her she stated "I am eatign right now I cant schedule it." I informed her that she should call back to schedule a pre op appointment to see Dr. Lorelei Pont. I never told her to come in-as I never scheduled.

## 2019-01-22 NOTE — Telephone Encounter (Signed)
Copied from [place] [phone]. Topic: General - Other >> Jan 17, 2019  9:00 AM Antonieta Iba C wrote: Reason for CRM: Debbie / Dr. Louis Meckel with [facility] called in to request  clearance for pt. Pt is having surgery on 02/26/19 and is currently taking Plavix. [facility] would like the okay for pt to stop taking medication until surgery. Jackelyn Poling is faxing over a form for PCP to complete.   CB: [phone] ext [phone]   Fax: [phone]

## 2019-01-22 NOTE — Telephone Encounter (Signed)
Please see previous telephone note from 01/21/18. Patient coming in today at 9:00 for appt.

## 2019-01-22 NOTE — Patient Instructions (Addendum)
Good to see you today- I will be in touch with your labs asap We will get you set up for a treadmill test prior to your planned operation I have a call out to Dr. Terrial Rhodes regarding your plavix and will let you know what he says.  I also said he can call you directly- if his happens please let me know what his recommendation  You might want to get the shingles vaccine- shingrix- which can be given at your pharmacy  This is a 2 shot series to help prevent shingles   As soon as we get everything taken care of, I will send in your surgical clearance form

## 2019-01-29 ENCOUNTER — Ambulatory Visit (INDEPENDENT_AMBULATORY_CARE_PROVIDER_SITE_OTHER): Payer: Medicare HMO

## 2019-01-29 DIAGNOSIS — Z01818 Encounter for other preprocedural examination: Secondary | ICD-10-CM

## 2019-01-29 LAB — EXERCISE TOLERANCE TEST
CHL CUP RESTING HR STRESS: 60 {beats}/min
CHL RATE OF PERCEIVED EXERTION: 15
CSEPED: 5 min
CSEPEW: 7 METS
Exercise duration (sec): 45 s
MPHR: 146 {beats}/min
Peak HR: 131 {beats}/min
Percent HR: 89 %

## 2019-01-30 ENCOUNTER — Telehealth: Payer: Self-pay | Admitting: Family Medicine

## 2019-01-30 NOTE — Telephone Encounter (Signed)
Called her and left message on machine that she did fine on her stress test, I will clear her for her urological procedure She called me back and I gave her these results on the phone   Narrative & Impression      Blood pressure demonstrated a hypertensive response to exercise.  No T wave inversion was noted during stress.  There was no ST segment deviation noted during stress.  Blood pressure demonstrated a hypertensive response to exercise  Overall, the patient's exercise capacity was mildly impaired.  Duke Treadmill Score: low risk   Negative stress test without evidence of ischemia at given workload.     Lab Results  Component Value Date   HGBA1C 8.5 (H) 01/22/2019    Did advise urology that her DM is not under great control.

## 2019-02-12 ENCOUNTER — Telehealth: Payer: Self-pay

## 2019-02-12 DIAGNOSIS — E118 Type 2 diabetes mellitus with unspecified complications: Secondary | ICD-10-CM

## 2019-02-12 NOTE — Telephone Encounter (Signed)
Copied from [place] [phone] [phone]. Topic: General - Other >> Feb 12, 2019  8:44 AM Oneta Rack wrote: Relation to pt: self  Call back number: Pharmacy:  Reason for call:  Patient states she cant take liraglutide (VICTOZA) 18 MG/3ML SOPN due to medication causing diarrhea and gas, please advise regarding alternate

## 2019-02-13 MED ORDER — GLIPIZIDE 5 MG PO TABS: 5.0000 mg | ORAL_TABLET | Freq: Every day | ORAL | 3 refills | Status: DC

## 2019-02-13 NOTE — Telephone Encounter (Signed)
Lab Results  Component Value Date   HGBA1C 8.5 (H) 01/22/2019   She is on metformin, tresiba Also victoza but this is causing a lot of gas and diarrhea She has not tolerated trulicity  Also did not tolerate jardiance  Will add a low dose of glipizide to her regimen She will see me in about 2 months for a recheck/ A1c

## 2019-02-18 ENCOUNTER — Encounter (HOSPITAL_COMMUNITY): Payer: Self-pay

## 2019-02-19 ENCOUNTER — Other Ambulatory Visit: Payer: Self-pay

## 2019-02-19 ENCOUNTER — Encounter (HOSPITAL_COMMUNITY)
Admission: RE | Admit: 2019-02-19 | Discharge: 2019-02-19 | Disposition: A | Discharge status: Home / Self Care | Payer: Medicare HMO | Source: Ambulatory Visit | Attending: Urology | Admitting: Urology

## 2019-02-19 ENCOUNTER — Encounter (HOSPITAL_COMMUNITY): Payer: Self-pay

## 2019-02-19 DIAGNOSIS — Z01818 Encounter for other preprocedural examination: Secondary | ICD-10-CM | POA: Insufficient documentation

## 2019-02-19 HISTORY — DX: Peripheral vascular disease, unspecified: I73.9

## 2019-02-19 HISTORY — DX: Personal history of malignant neoplasm of cervix uteri: Z85.41

## 2019-02-19 HISTORY — DX: Personal history of malignant neoplasm of breast: Z85.3

## 2019-02-19 HISTORY — DX: Long term (current) use of anticoagulants: Z79.01

## 2019-02-19 HISTORY — DX: Personal history of other diseases of the digestive system: Z87.19

## 2019-02-19 HISTORY — DX: Obstructive sleep apnea (adult) (pediatric): Z99.89

## 2019-02-19 HISTORY — DX: Personal history of other diseases of the circulatory system: Z86.79

## 2019-02-19 HISTORY — DX: Type 2 diabetes mellitus without complications: Z79.4

## 2019-02-19 HISTORY — DX: Other specified postprocedural states: Z98.890

## 2019-02-19 HISTORY — DX: Diaphragmatic hernia without obstruction or gangrene: K44.9

## 2019-02-19 HISTORY — DX: Obstructive sleep apnea (adult) (pediatric): G47.33

## 2019-02-19 HISTORY — DX: Other specified postprocedural states: Z85.828

## 2019-02-19 HISTORY — DX: Other specified postprocedural states: R11.2

## 2019-02-19 HISTORY — DX: Stress incontinence (female) (male): N39.3

## 2019-02-19 HISTORY — DX: Presence of spectacles and contact lenses: Z97.3

## 2019-02-19 HISTORY — DX: Other chronic cystitis without hematuria: N30.20

## 2019-02-19 HISTORY — DX: Heart failure, unspecified: I50.9

## 2019-02-19 HISTORY — DX: Polyneuropathy, unspecified: G62.9

## 2019-02-19 HISTORY — DX: Type 2 diabetes mellitus without complications: E11.9

## 2019-02-19 LAB — COMPREHENSIVE METABOLIC PANEL
ALT: 67 U/L — AB (ref 0–44)
AST: 95 U/L — AB (ref 15–41)
Albumin: 4 g/dL (ref 3.5–5.0)
Alkaline Phosphatase: 42 U/L (ref 38–126)
Anion gap: 12 (ref 5–15)
BUN: 19 mg/dL (ref 8–23)
CO2: 20 mmol/L — ABNORMAL LOW (ref 22–32)
Calcium: 9.3 mg/dL (ref 8.9–10.3)
Chloride: 108 mmol/L (ref 98–111)
Creatinine, Ser: 1.09 mg/dL — ABNORMAL HIGH (ref 0.44–1.00)
GFR calc Af Amer: 58 mL/min — ABNORMAL LOW (ref >60)
GFR calc non Af Amer: 50 mL/min — ABNORMAL LOW (ref >60)
GLUCOSE: 202 mg/dL — AB (ref 70–99)
Potassium: 4.3 mmol/L (ref 3.5–5.1)
Sodium: 140 mmol/L (ref 135–145)
Total Bilirubin: 0.7 mg/dL (ref 0.3–1.2)
Total Protein: 7.1 g/dL (ref 6.5–8.1)

## 2019-02-19 LAB — CBC
HCT: 41.5 % (ref 36.0–46.0)
HEMOGLOBIN: 13.9 g/dL (ref 12.0–15.0)
MCH: 30.5 pg (ref 26.0–34.0)
MCHC: 33.5 g/dL (ref 30.0–36.0)
MCV: 91 fL (ref 80.0–100.0)
Platelets: 288 10*3/uL (ref 150–400)
RBC: 4.56 MIL/uL (ref 3.87–5.11)
RDW: 14.7 % (ref 11.5–15.5)
WBC: 6.3 10*3/uL (ref 4.0–10.5)
nRBC: 0 % (ref 0.0–0.2)

## 2019-02-19 LAB — GLUCOSE, CAPILLARY: Glucose-Capillary: 217 mg/dL — ABNORMAL HIGH (ref 70–99)

## 2019-02-19 MED ORDER — CHLORHEXIDINE GLUCONATE 4 % EX LIQD
Freq: Once | CUTANEOUS | Status: DC
  Filled 2019-02-19: qty 15

## 2019-02-19 NOTE — Progress Notes (Signed)
EKG dated 01-22-2019 in epic.  ETT result dated 01-29-2019 in epic.  Pt pcp clearance , dr j. Copeland, dated 01-30-2019 with plavix clearance with chart. Pt aware when to stop , 5 days prior to surgery.  Pt pcp lov note dated 01-22-2019 in epic.  A1c result, 8.5, dated 01-22-2019 in epic.  Chart to anesthesia for review, Konrad Felix PA.

## 2019-02-19 NOTE — Patient Instructions (Signed)
Mandy Kim  02/19/2019      Your procedure is scheduled on:   02-26-2019   Report to [facility] Main  Entrance, report to Short Stay at  5:30 AM    Call this number if you have problems the morning of surgery [phone]        Remember: Do not eat food or drink liquids :After Midnight.  This includes no water, candy, gum, mints  BRUSH YOUR TEETH MORNING OF SURGERY AND RINSE YOUR MOUTH OUT          Take these medicines the morning of surgery with A SIP OF WATER:   Gabapentin,  Gemfibrozil (lopid),  Pantoprazole (protonix)                                   You may not have any metal on your body including hair pins and               piercings  Do not wear jewelry, make-up, lotions, powders or perfumes, deodorant              Do not wear nail polish.  Do not shave  48 hours prior to surgery.                     Do not bring valuables to the hospital. [place].  Contacts, dentures or bridgework may not be worn into surgery.  Leave suitcase in the car. After surgery it may be brought to your room.    _____________________________________________________________________  How to Manage Your Diabetes Before and After Surgery  Why is it important to control my blood sugar before and after surgery? . Improving blood sugar levels before and after surgery helps healing and can limit problems. . A way of improving blood sugar control is eating a healthy diet by: o  Eating less sugar and carbohydrates o  Increasing activity/exercise o  Talking with your doctor about reaching your blood sugar goals . High blood sugars (greater than 180 mg/dL) can raise your risk of infections and slow your recovery, so you will need to focus on controlling your diabetes during the weeks before surgery. . Make sure that the doctor who takes care of your diabetes knows about your planned surgery including the  date and location.  How do I manage my blood sugar before surgery? . Check your blood sugar at least 4 times a day, starting 2 days before surgery, to make sure that the level is not too high or low. o Check your blood sugar the morning of your surgery when you wake up and every 2 hours until you get to the Short Stay unit. . If your blood sugar is less than 70 mg/dL, you will need to treat for low blood sugar: o Do not take insulin. o Treat a low blood sugar (less than 70 mg/dL) with  cup of clear juice (cranberry or apple), 4 glucose tablets, OR glucose gel. o Recheck blood sugar in 15 minutes after treatment (to make sure it is greater than 70 mg/dL). If your blood sugar is not greater than 70 mg/dL on recheck, call [phone] for further instructions. . Report your blood sugar to the short stay  nurse when you get to Short Stay.  . If you are admitted to the hospital after surgery: o Your blood sugar will be checked by the staff and you will probably be given insulin after surgery (instead of oral diabetes medicines) to make sure you have good blood sugar levels. o The goal for blood sugar control after surgery is 80-180 mg/dL.   WHAT DO I DO ABOUT MY DIABETES MEDICATION?  . DO NOT  take oral diabetes medicines (pills) the morning of surgery. .   . THE NIGHT BEFORE SURGERY, take 50% dose @ 30 units   units of     Tresiba   insulin.       . THE MORNING OF SURGERY,   NO      insulin.  . The day of surgery, do not take other diabetes injectables, including Byetta (exenatide), Bydureon (exenatide ER), Victoza (liraglutide), or Trulicity (dulaglutide)..      Patient Signature:  Date:   Nurse Signature:  Date:   Reviewed and Endorsed by [facility] Patient Education Committee, August 2015           [facility] - Preparing for Surgery Before surgery, you can play an important role.  Because skin is not sterile, your skin needs to be as free of germs as possible.  You can reduce  the number of germs on your skin by washing with CHG (chlorahexidine gluconate) soap before surgery.  CHG is an antiseptic cleaner which kills germs and bonds with the skin to continue killing germs even after washing. Please DO NOT use if you have an allergy to CHG or antibacterial soaps.  If your skin becomes reddened/irritated stop using the CHG and inform your nurse when you arrive at Short Stay. Do not shave (including legs and underarms) for at least 48 hours prior to the first CHG shower.  You may shave your face/neck. Please follow these instructions carefully:  1.  Shower with CHG Soap the night before surgery and the  morning of Surgery.  2.  If you choose to wash your hair, wash your hair first as usual with your  normal  shampoo.  3.  After you shampoo, rinse your hair and body thoroughly to remove the  shampoo.                            4.  Use CHG as you would any other liquid soap.  You can apply chg directly  to the skin and wash                       Gently with a scrungie or clean washcloth.  5.  Apply the CHG Soap to your body ONLY FROM THE NECK DOWN.   Do not use on face/ open                           Wound or open sores. Avoid contact with eyes, ears mouth and genitals (private parts).                       Wash face,  Genitals (private parts) with your normal soap.             6.  Wash thoroughly, paying special attention to the area where your surgery  will be performed.  7.  Thoroughly rinse your body with warm water from  the neck down.  8.  DO NOT shower/wash with your normal soap after using and rinsing off  the CHG Soap.             9.  Pat yourself dry with a clean towel.            10.  Wear clean pajamas.            11.  Place clean sheets on your bed the night of your first shower and do not  sleep with pets. Day of Surgery : Do not apply any lotions/deodorants the morning of surgery.  Please wear clean clothes to the hospital/surgery  center.   ________________________________________________________________________  WHAT IS A BLOOD TRANSFUSION? Blood Transfusion Information  A transfusion is the replacement of blood or some of its parts. Blood is made up of multiple cells which provide different functions.  Red blood cells carry oxygen and are used for blood loss replacement.  White blood cells fight against infection.  Platelets control bleeding.  Plasma helps clot blood.  Other blood products are available for specialized needs, such as hemophilia or other clotting disorders. BEFORE THE TRANSFUSION  Who gives blood for transfusions?   Healthy volunteers who are fully evaluated to make sure their blood is safe. This is blood bank blood. Transfusion therapy is the safest it has ever been in the practice of medicine. Before blood is taken from a donor, a complete history is taken to make sure that person has no history of diseases nor engages in risky social behavior (examples are intravenous drug use or sexual activity with multiple partners). The donor's travel history is screened to minimize risk of transmitting infections, such as malaria. The donated blood is tested for signs of infectious diseases, such as HIV and hepatitis. The blood is then tested to be sure it is compatible with you in order to minimize the chance of a transfusion reaction. If you or a relative donates blood, this is often done in anticipation of surgery and is not appropriate for emergency situations. It takes many days to process the donated blood. RISKS AND COMPLICATIONS Although transfusion therapy is very safe and saves many lives, the main dangers of transfusion include:   Getting an infectious disease.  Developing a transfusion reaction. This is an allergic reaction to something in the blood you were given. Every precaution is taken to prevent this. The decision to have a blood transfusion has been considered carefully by your  caregiver before blood is given. Blood is not given unless the benefits outweigh the risks. AFTER THE TRANSFUSION  Right after receiving a blood transfusion, you will usually feel much better and more energetic. This is especially true if your red blood cells have gotten low (anemic). The transfusion raises the level of the red blood cells which carry oxygen, and this usually causes an energy increase.  The nurse administering the transfusion will monitor you carefully for complications. HOME CARE INSTRUCTIONS  No special instructions are needed after a transfusion. You may find your energy is better. Speak with your caregiver about any limitations on activity for underlying diseases you may have. SEEK MEDICAL CARE IF:   Your condition is not improving after your transfusion.  You develop redness or irritation at the intravenous (IV) site. SEEK IMMEDIATE MEDICAL CARE IF:  Any of the following symptoms occur over the next 12 hours:  Shaking chills.  You have a temperature by mouth above 102 F (38.9 C), not controlled by medicine.  Chest, back, or muscle pain.  People around you feel you are not acting correctly or are confused.  Shortness of breath or difficulty breathing.  Dizziness and fainting.  You get a rash or develop hives.  You have a decrease in urine output.  Your urine turns a dark color or changes to pink, red, or brown. Any of the following symptoms occur over the next 10 days:  You have a temperature by mouth above 102 F (38.9 C), not controlled by medicine.  Shortness of breath.  Weakness after normal activity.  The white part of the eye turns yellow (jaundice).  You have a decrease in the amount of urine or are urinating less often.  Your urine turns a dark color or changes to pink, red, or brown. Document Released: 11/17/2000 Document Revised: 02/12/2012 Document Reviewed: 07/06/2008 [facility] Patient Information 2014 [place],  [place].  _______________________________________________________________________

## 2019-02-20 ENCOUNTER — Encounter (HOSPITAL_COMMUNITY): Payer: Self-pay | Admitting: Physician Assistant

## 2019-02-20 LAB — URINE CULTURE

## 2019-02-20 LAB — ABO/RH: ABO/RH(D): A POS

## 2019-02-20 NOTE — Anesthesia Preprocedure Evaluation (Deleted)
Anesthesia Evaluation    Airway        Dental   Pulmonary former smoker,           Cardiovascular      Neuro/Psych    GI/Hepatic   Endo/Other  diabetes  Renal/GU      Musculoskeletal   Abdominal   Peds  Hematology   Anesthesia Other Findings   Reproductive/Obstetrics                             Anesthesia Physical Anesthesia Plan  ASA:   Anesthesia Plan:    Post-op Pain Management:    Induction:   PONV Risk Score and Plan:   Airway Management Planned:   Additional Equipment:   Intra-op Plan:   Post-operative Plan:   Informed Consent:   Plan Discussed with:   Anesthesia Plan Comments: (See PAT note 02/19/19, Konrad Felix, PA-C)        Anesthesia Quick Evaluation

## 2019-02-20 NOTE — Progress Notes (Signed)
Anesthesia Chart Review   Case:  [id number] Date/Time:  02/26/19 0702   Procedure:  XI ROBOTIC ASSISTED LAPAROSCOPIC SACROCOLPOPEXY (N/A )   Anesthesia type:  General   Pre-op diagnosis:  pelvic organ prolapse   Location:  [facility] ROOM 03 / [facility]   Surgeon:  Ardis Hughs, MD      DISCUSSION: 73 yo former smoker (quit 06/07/99) with h/o PONV, HLD, GERD, OSA on CPAP, PAD, CKD, AAA s/p repair 09/15/11, breast cancer (s/p mastectomy, chemo), cervical cancer (s/p TAH w/BSO), DM II, hiatal hernia, CHF, h/o fatty liver on [place] 4/18 (negative hep panel, LFTS stable), pelvic organ prolapse scheduled for above procedure 02/26/19 with Dr. Louis Meckel.   Pt last seen by PCP, Dr. Janett Billow Copland, 01/22/19.  Per this note, diabetes with variable control.  A1C 8.5 01/22/19 which is down from 10.  Lantus increased.  Stress test ordered at this visit, done 01/29/19, "Negative stress test without evidence of ischemia at given workload."  Per Dr. Lorelei Pont, "she did fine on her stress test, I will clear her for her urological procedure."  Per Dr. Benedetto Goad with vascular surgery pt can hold Plavix 5 days prior to procedure.  Per Dr. Lillie Fragmin note 01/22/19, She had a follow-up visit with her vascular surgeons at [facility] in October. They found that her graft was stable, with no significant increase in the AAA surrounding the graft."    Pt can proceed with planned procedure barring acute status change.  VS: BP (!) 145/66   Pulse 75   Temp 37 C (Oral)   Resp 14   Ht _0  (1.626 m)   Wt 81.3 kg   SpO2 96%   BMI 30.76 kg/m   PROVIDERS: Copland, Gay Filler, MD is PCP   Ladean Raya, MD is Vascular Surgeon LABS: Labs reviewed: Acceptable for surgery. (all labs ordered are listed, but only abnormal results are displayed)  Labs Reviewed  URINE CULTURE - Abnormal; Notable for the following components:      Result Value   Culture   (*)    Value: <10,000 COLONIES/mL INSIGNIFICANT  GROWTH Performed at [facility], [address], [place], [place] [address]    All other components within normal limits  COMPREHENSIVE METABOLIC PANEL - Abnormal; Notable for the following components:   CO2 20 (*)    Glucose, Bld 202 (*)    Creatinine, Ser 1.09 (*)    AST 95 (*)    ALT 67 (*)    GFR calc non Af Amer 50 (*)    GFR calc Af Amer 58 (*)    All other components within normal limits  GLUCOSE, CAPILLARY - Abnormal; Notable for the following components:   Glucose-Capillary 217 (*)    All other components within normal limits  CBC  TYPE AND SCREEN  ABO/RH     IMAGES:   EKG: 01/22/19 Rate 65 bpm Sinus rhythm  Within normal limits   CV: Echo 02/24/12 Study Conclusions  - Left ventricle: Systolic function was normal. The estimated ejection fraction was in the range of 50% to 55%. Left ventricular diastolic function parameters were normal. - Pulmonary arteries: PA peak pressure: 39m Hg (S). Transthoracic echocardiography. M-mode, complete 2D, spectral Doppler, and color Doppler. Height: Height: 162.6cm. Height: 64in. Weight: Weight: 81.4kg. Weight: 179.1lb. Body mass index: BMI: 30.8kg/m^2. Body surface area:  BSA: 1.950m. Blood pressure:   109/48. Patient status: Inpatient. Location: Bedside.  Stress Test 01/29/2019  Blood pressure demonstrated a  hypertensive response to exercise.  No T wave inversion was noted during stress.  There was no ST segment deviation noted during stress.  Blood pressure demonstrated a hypertensive response to exercise  Overall, the patient's exercise capacity was mildly impaired.  Duke Treadmill Score: low risk   Negative stress test without evidence of ischemia at given workload.  Past Medical History:  Diagnosis Date  . Anticoagulant long-term use    plavix  . Cancer ([place])   . CHF (congestive heart failure) ([place])   . Chronic cystitis   . Chronic kidney disease   . Diverticulitis    . GERD (gastroesophageal reflux disease)   . Hiatal hernia   . History of basal cell carcinoma excision    face/ nose right side  . History of bilateral breast cancer followed by oncologist until 2008,  per pt no recurrence   right 2002  and left 2003 s/p  mastectomy with completed chemo and tamexifen therapy   . History of cervical cancer 2000   s/p  TAH w/ BSO  . History of diverticulitis of colon   . Hyperlipidemia   . OSA on CPAP   . PAD (peripheral artery disease) ([place])   . Peripheral neuropathy   . PONV (postoperative nausea and vomiting)   . S/P AAA (abdominal aortic aneurysm) repair 09/15/2011  . Stress incontinence due to pelvic organ prolapse   . Type 2 diabetes mellitus treated with insulin ([place])    followed by pcp  . Wears glasses     Past Surgical History:  Procedure Laterality Date  . ABDOMINAL HYSTERECTOMY  2000   w/  BSO,  and Bladder tacking abdominally  . CARPAL TUNNEL RELEASE Left 1999  . CATARACT EXTRACTION W/ INTRAOCULAR LENS  IMPLANT, BILATERAL  2015  . ENDOVASCULAR REPAIR/STENT GRAFT  09-15-2011    _0   . INCONTINENCE SURGERY  2007  approx.  Marland Kitchen MASTECTOMY Bilateral right 2002;  left 2003   lymph node dissection done only on right side  . TONSILLECTOMY  child  . TYMPANOPLASTY Right 1970s   "had my ear drum replacement with a plastic one"  . WRIST GANGLION EXCISION Left 1998    MEDICATIONS: . ACCU-CHEK SOFTCLIX LANCETS lancets  . Alcohol Swabs (B-D SINGLE USE SWABS REGULAR) PADS  . Alcohol Swabs PADS  . b complex vitamins tablet  . Biotin 10000 MCG TABS  . Blood Glucose Calibration (GLUCOSE CONTROL) SOLN  . Blood Glucose Monitoring Suppl (ACCU-CHEK AVIVA PLUS) w/Device KIT  . Blood Glucose Monitoring Suppl (TRUE METRIX AIR GLUCOSE METER) w/Device KIT  . cephALEXin (KEFLEX) 250 MG capsule  . Cholecalciferol (VITAMIN D) 50 MCG (2000 UT) CAPS  . clopidogrel (PLAVIX) 75 MG tablet  . Cranberry 500 MG TABS  . gabapentin (NEURONTIN) 100 MG capsule   . gemfibrozil (LOPID) 600 MG tablet  . glipiZIDE (GLUCOTROL) 5 MG tablet  . glucose blood (TRUE METRIX BLOOD GLUCOSE TEST) test strip  . insulin degludec (TRESIBA FLEXTOUCH) 100 UNIT/ML SOPN FlexTouch Pen  . Insulin Pen Needle 32G X 4 MM MISC  . Insulin Syringe-Needle U-100 (INSULIN SYRINGE 1CC/30GX5/16") 30G X 5/16" 1 ML MISC  . magnesium oxide (MAG-OX) 400 MG tablet  . Menthol, Topical Analgesic, (BIOFREEZE ROLL-ON EX)  . metFORMIN (GLUCOPHAGE) 1000 MG tablet  . Multiple Vitamin (MULTIVITAMIN) tablet  . niacin (NIASPAN) 500 MG CR tablet  . Omega-3 Fatty Acids (FISH OIL) 1000 MG CAPS  . pantoprazole (PROTONIX) 40 MG tablet  . potassium chloride SA (KLOR-CON M20) 20 MEQ tablet  .  Probiotic Product (PROBIOTIC DAILY PO)  . sodium chloride (OCEAN) 0.65 % SOLN nasal spray  . zinc gluconate 50 MG tablet   No current facility-administered medications for this encounter.     Konrad Felix, PA-C [facility] Pre-Surgical Testing [phone] 02/20/19 1:40 PM

## 2019-02-26 ENCOUNTER — Encounter (HOSPITAL_COMMUNITY): Admission: RE | Payer: Self-pay | Source: Other Acute Inpatient Hospital

## 2019-02-26 ENCOUNTER — Ambulatory Visit (HOSPITAL_COMMUNITY): Admission: RE | Admit: 2019-02-26 | Payer: Medicare HMO | Source: Other Acute Inpatient Hospital | Admitting: Urology

## 2019-02-26 LAB — TYPE AND SCREEN
ABO/RH(D): A POS
Antibody Screen: NEGATIVE

## 2019-02-26 SURGERY — SACROCOLPOPEXY, ROBOT-ASSISTED, LAPAROSCOPIC
Anesthesia: General

## 2019-02-28 ENCOUNTER — Ambulatory Visit: Payer: Self-pay | Admitting: *Deleted

## 2019-02-28 NOTE — Telephone Encounter (Addendum)
Message from [place] sent at 02/28/2019 12:15 PM EDT   Summary: diarrhea x4 days- thinks it's due to medication    Patient is requesting call back from nurse to discuss a possible reaction to glipiZIDE (GLUCOTROL) 5 MG tablet for x4 days. She states this medication has been causing diarrhea and would like some advice.           Returned call to patient with c/o having diarrhea after starting th glipizide. She has taken the medication for 4 days and has had diarrhea that last for about 12 hours. So it is keeping her from resting.  She also states that the medication is making her burp and giving her gas.  When that happens she feels nauseated but has not vomited. She quit taking the medication and now her blood sugars are funning how. Blood sugar it morning was 258 before breakfast.  She is drinking lots of water and watching what she eats.  She is requesting a call back regarding this. Routing to flow at [facility] at [facility] for recommendations. Call back # is [phone]

## 2019-03-03 MED ORDER — EMPAGLIFLOZIN 10 MG PO TABS: 10.0000 mg | ORAL_TABLET | Freq: Every day | ORAL | 3 refills | Status: DC

## 2019-03-03 NOTE — Telephone Encounter (Addendum)
Lab Results  Component Value Date   HGBA1C 8.5 (H) 01/22/2019   I last saw her in February for a pre-op visit - planned bladder sling- this has been delayed - COVID tresbia 60 Metformin Glipizide  Intolerant to GLP1- has tried 2  Tried jardiance- at least on her med list.  She does not think she actually took it.  Will have her try it again.  She will decrease her insulin by 5- 10 units the first few days to make sure she does not drop too low She has not been able to tolerate glipizide- DC  appt with me scheduled for mid May which is fine   Meds ordered this encounter  Medications  . empagliflozin (JARDIANCE) 10 MG TABS tablet    Sig: Take 10 mg by mouth daily.    Dispense:  30 tablet    Refill:  3

## 2019-03-03 NOTE — Addendum Note (Signed)
Addended by: Darreld Mclean on: 03/03/2019 06:36 PM   Modules accepted: Orders

## 2019-03-04 ENCOUNTER — Telehealth: Payer: Self-pay | Admitting: Family Medicine

## 2019-03-04 DIAGNOSIS — E1142 Type 2 diabetes mellitus with diabetic polyneuropathy: Secondary | ICD-10-CM

## 2019-03-04 NOTE — Telephone Encounter (Signed)
Copied from [place] [phone]. Topic: Quick Communication - Rx Refill/Question >> Mar 04, 2019 12:50 PM Mcneil, Ja-Kwan wrote: Pt stated the gabapentin (NEURONTIN) 100 MG capsule was suppose to be sent to [facility] not [facility].   Medication: gabapentin (NEURONTIN) 100 MG capsule  Has the patient contacted their pharmacy? yes   Preferred Pharmacy (with phone number or street name):   Agent: Please be advised that RX refills may take up to 3 business days. We ask that you follow-up with your pharmacy.

## 2019-03-05 MED ORDER — GABAPENTIN 100 MG PO CAPS: 100.0000 mg | ORAL_CAPSULE | Freq: Two times a day (BID) | ORAL | 3 refills | Status: DC

## 2019-03-05 NOTE — Telephone Encounter (Signed)
Medications resent

## 2019-03-05 NOTE — Addendum Note (Signed)
Addended by: Wynonia Musty A on: 03/05/2019 11:14 AM   Modules accepted: Orders

## 2019-03-07 ENCOUNTER — Other Ambulatory Visit: Payer: Self-pay | Admitting: Family Medicine

## 2019-03-07 DIAGNOSIS — E118 Type 2 diabetes mellitus with unspecified complications: Secondary | ICD-10-CM

## 2019-03-07 DIAGNOSIS — K219 Gastro-esophageal reflux disease without esophagitis: Secondary | ICD-10-CM

## 2019-03-08 ENCOUNTER — Other Ambulatory Visit: Payer: Self-pay | Admitting: Family Medicine

## 2019-03-08 MED ORDER — ACCU-CHEK AVIVA PLUS W/DEVICE KIT: PACK | 0 refills | Status: DC

## 2019-03-19 ENCOUNTER — Ambulatory Visit: Payer: Medicare HMO | Admitting: Family Medicine

## 2019-03-24 ENCOUNTER — Telehealth: Payer: Self-pay

## 2019-03-24 DIAGNOSIS — E118 Type 2 diabetes mellitus with unspecified complications: Secondary | ICD-10-CM

## 2019-03-24 NOTE — Telephone Encounter (Signed)
Patient to inform her that we did not have Ozempic samples in this office. Advised I would forward her message to Dr. Lorelei Pont.

## 2019-03-24 NOTE — Telephone Encounter (Signed)
Copied from [place] [id number]. Topic: General - Other >> Mar 24, 2019 12:23 PM Mcneil, Ja-Kwan wrote: Reason for CRM: Pt stated she can not take the last diabetic medication that was prescribed. Pt stated she would like to ask Dr. Lorelei Pont if she has samples of Ozempic that she can try. Pt requests call back. Cb# [phone]

## 2019-03-25 NOTE — Telephone Encounter (Signed)
Called, no answer,  Will try back later

## 2019-03-25 NOTE — Telephone Encounter (Signed)
Patient returned Dr. Serita Grit call.

## 2019-03-26 ENCOUNTER — Other Ambulatory Visit: Payer: Self-pay | Admitting: Family Medicine

## 2019-03-26 DIAGNOSIS — E118 Type 2 diabetes mellitus with unspecified complications: Secondary | ICD-10-CM

## 2019-03-26 MED ORDER — SEMAGLUTIDE(0.25 OR 0.5MG/DOS) 2 MG/1.5ML ~~LOC~~ SOPN: 0.2500 mg | PEN_INJECTOR | SUBCUTANEOUS | 3 refills | Status: DC

## 2019-03-26 NOTE — Telephone Encounter (Signed)
Called patient and was able to reach her At our last visit I had her go back on Victoza, as her A1c was too high However this caused GI upset sx. I added glipizide but she also did not tolerate this med  Lab Results  Component Value Date   HGBA1C 8.5 (H) 01/22/2019   She notes that Victoza, like Trulicity caused stomach upset and gas.  However she did not have any SOB, rash, swelling, or hives with either of these medications  She would like to try Ozempic, understands it is in the same medication class Since she did not have any dangerous reaction to other GLP-1 med, I think this is reasonable  I will double check that we don't have a sample that she can try when I am in tomorrow.  Otherwise she will fill and use Ozempic rx   jardiance tresiba insulin  Metformin  Meds ordered this encounter  Medications  . Semaglutide,0.25 or 0.5MG /DOS, (OZEMPIC, 0.25 OR 0.5 MG/DOSE,) 2 MG/1.5ML SOPN    Sig: Inject 0.25 mg into the skin once a week. Can increase to 0.5 mg after 4 weeks    Dispense:  4 pen    Refill:  3

## 2019-03-26 NOTE — Addendum Note (Signed)
Addended by: Lamar Blinks C on: 03/26/2019 11:42 AM   Modules accepted: Orders

## 2019-04-16 ENCOUNTER — Other Ambulatory Visit: Payer: Self-pay

## 2019-04-16 ENCOUNTER — Ambulatory Visit (INDEPENDENT_AMBULATORY_CARE_PROVIDER_SITE_OTHER): Payer: Medicare HMO | Admitting: Family Medicine

## 2019-04-16 ENCOUNTER — Encounter: Payer: Self-pay | Admitting: Family Medicine

## 2019-04-16 DIAGNOSIS — E1142 Type 2 diabetes mellitus with diabetic polyneuropathy: Secondary | ICD-10-CM

## 2019-04-16 DIAGNOSIS — I10 Essential (primary) hypertension: Secondary | ICD-10-CM

## 2019-04-16 DIAGNOSIS — E785 Hyperlipidemia, unspecified: Secondary | ICD-10-CM | POA: Diagnosis not present

## 2019-04-16 DIAGNOSIS — E118 Type 2 diabetes mellitus with unspecified complications: Secondary | ICD-10-CM

## 2019-04-16 DIAGNOSIS — K760 Fatty (change of) liver, not elsewhere classified: Secondary | ICD-10-CM | POA: Diagnosis not present

## 2019-04-16 NOTE — Progress Notes (Signed)
[facility] [address], [place], [place] [address] [phone]  Date:  04/16/2019   Name:  Mandy Kim   DOB:  Apr 13, 1946   MRN:  [id number]  PCP:  Darreld Mclean, MD    Chief Complaint: No chief complaint on file.   History of Present Illness:  Mandy Kim is a 73 y.o. very pleasant female patient who presents with the following:  Virtual follow-up visit today due to pandemic Patient location is home Provider location is office Pt identity confirmed with 2 identifiers, she gives consent for virtual telephone visit today  Mandy Kim has history of diabetes, hyperlipidemia, elevated LFTs, peripheral neuropathy, breast cancer in 2005 status post double mastectomy, cervical cancer 2000 status post total hysterectomy, AAA with repair per vascular surgery in 2013 Her vascular surgeon is Dr. [place] Silos with [facility] vascular.  She followed up with him most recently in October  I last saw Mandy Kim in the office in February, for a preoperative visit.  She plans to have a bladder sling per her urologist, Dr. Louis Meckel.  This was planned for March, but was rescheduled due to pandemic- now scheduled for 6/19.  She did a stress test prior to surgery, this was normal  At last visit her A1c was noted to be 8.5%.  We added Victoza to her metformin and [name] She stopped taking Victoza due to gas and diarrhea.  I had her start glipizide instead-again she did not tolerate this either We decided to try Ozempic instead-Mandy Kim really want to try this medication, although she had not tolerated Victoza or Trulicity.  Thankfully she has done well with Ozempic, tolerating fine  Tresiba 65 units Metformin 1000 twice a day Ozempic; she is taking this and doing well.  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 Hypoxia   . Sepsis ([place]) 05/28/2017  . Elevated LFTs 05/28/2017  . Acute lower UTI 05/28/2017  . Headache 05/28/2017  . Dehydration 05/28/2017  . Peripheral neuropathy 07/27/2014  . Other and unspecified hyperlipidemia 02/12/2012  . AAA (abdominal aortic aneurysm) ([place]) 02/12/2012  . Fatty liver 02/12/2012  . Breast cancer ([place]) 02/12/2012  . Cervical cancer ([place]) 02/12/2012  . Diabetes mellitus ([place]) 02/12/2012  . Hyperlipidemia 02/12/2012    Past Medical History:  Diagnosis Date  . Anticoagulant long-term use    plavix  . Cancer ([place])   . CHF (congestive heart failure) ([place])   . Chronic cystitis   . Chronic kidney disease   . Diverticulitis   . GERD (gastroesophageal reflux disease)   . Hiatal hernia   . History of basal cell carcinoma excision    face/ nose right side  . History of bilateral breast cancer followed by oncologist until 2008,  per pt no recurrence  right 2002  and left 2003 s/p  mastectomy with completed chemo and tamexifen therapy   . History of cervical cancer 2000   s/p  TAH w/ BSO  . History of diverticulitis of colon   . Hyperlipidemia   . OSA on CPAP   . PAD (peripheral artery disease) ([place])   . Peripheral neuropathy   . PONV (postoperative nausea and vomiting)   . S/P AAA (abdominal aortic aneurysm) repair 09/15/2011  . Stress incontinence due to pelvic organ prolapse   . Type 2 diabetes mellitus treated with insulin ([place])    followed by pcp  . Wears glasses     Past Surgical  History:  Procedure Laterality Date  . ABDOMINAL HYSTERECTOMY  2000   w/  BSO,  and Bladder tacking abdominally  . CARPAL TUNNEL RELEASE Left 1999  . CATARACT EXTRACTION W/ INTRAOCULAR LENS  IMPLANT, BILATERAL  2015  . ENDOVASCULAR REPAIR/STENT GRAFT  09-15-2011    _0   . INCONTINENCE SURGERY  2007  approx.  Marland Kitchen MASTECTOMY Bilateral right 2002;  left 2003   lymph node dissection done only on right side  . TONSILLECTOMY  child  . TYMPANOPLASTY Right 1970s   "had my ear drum replacement with a plastic one"  . WRIST GANGLION EXCISION Left 1998    Social History   Tobacco Use  . Smoking status: Former Smoker    Years: 30.00    Types: Cigarettes    Last attempt to quit: 06/07/1999    Years since quitting: 19.8  . Smokeless tobacco: Never Used  Substance Use Topics  . Alcohol use: Never    Frequency: Never  . Drug use: Never    Family History  Problem Relation Age of Onset  . Cancer Mother   . Pneumonia Mother   . Thyroid disease Mother   . Thyroid disease Sister   . Obesity Daughter   . Hypertension Daughter   . Thyroid disease Son   . Pneumonia Maternal Grandmother   . Heart attack Maternal Grandfather   . Diabetes Neg Hx     Allergies  Allergen Reactions  . Aspirin     Per pt any pain medications - causes fluid build up   . Bee Venom Swelling  . Crestor [Rosuvastatin Calcium] Other (See Comments)    "skin feels like bugs under skin"  . Ibuprofen Swelling  . Lipitor [Atorvastatin Calcium] Other (See Comments)    " skin feels like bugs under the skin"  . Poison Oak Extract Itching and Swelling  . Pravastatin Other (See Comments)    "skin feels like bugs under the skin"  . Simvastatin Other (See Comments)    "skin feels like bugs under the skin"  . Trulicity [Dulaglutide] Nausea And Vomiting and Other (See Comments)    Flatulance, "deathly sick"  . Tylenol [Acetaminophen] Swelling  . Victoza [Liraglutide] Other (See Comments)    Stomach upset issues      Medication list has been reviewed and updated.  Current Outpatient Medications on File Prior to Visit  Medication Sig Dispense Refill  . ACCU-CHEK SOFTCLIX LANCETS lancets TEST BLOOD SUGAR THREE TIMES DAILY 300 each 1  . Alcohol Swabs (B-D SINGLE USE SWABS REGULAR) PADS USE TO TEST BLOOD SUGAR 3 TIMES DAILY. 300 each 2  . Alcohol Swabs PADS Use to test blood sugar 3 times daily. Dx: E11.9 300 each 2  . b complex vitamins tablet Take 0.5 tablets by mouth 2 (two) times daily.     . Biotin  10000 MCG TABS Take 10,000 mcg by mouth daily.     . Blood Glucose Calibration (GLUCOSE CONTROL) SOLN Use to test blood sugar 3 times daily. Dx: E11.9 1 each 2  . Blood Glucose Monitoring Suppl (ACCU-CHEK AVIVA PLUS) w/Device KIT USE AS DIRECTED  TO TEST BLOOD GLUCOSE 1 kit 0  . cephALEXin (KEFLEX) 250 MG capsule Take 250 mg by mouth at bedtime. ONCE DAILY AT BEDTIME - takes for bladder infections    . Cholecalciferol (VITAMIN D) 50 MCG (2000 UT) CAPS Take 2,000 Units by mouth daily.     . clopidogrel (PLAVIX) 75 MG tablet Take 1 tablet (75 mg total) by mouth daily. 90 tablet 3  . Cranberry 500 MG TABS Take 500 mg by mouth daily.    . empagliflozin (JARDIANCE) 10 MG TABS tablet Take 10 mg by mouth daily. 30 tablet 3  . gabapentin (NEURONTIN) 100 MG capsule Take 1 capsule (100 mg total) by mouth 2 (two) times daily. 180 capsule 3  . gemfibrozil (LOPID) 600 MG tablet TAKE 1 TABLET (600 MG TOTAL) BY MOUTH 2 (TWO) TIMES DAILY BEFORE A MEAL. 180 tablet 3  . glucose blood (TRUE METRIX BLOOD GLUCOSE TEST) test strip TEST BLOOD SUGAR THREE TIMES DAILY 300 each 12  . insulin degludec (TRESIBA FLEXTOUCH) 100 UNIT/ML SOPN FlexTouch Pen 60 units subque daily (Patient taking differently: Inject 60 Units into the skin at bedtime. 60 units subque daily) 3 pen 6  . Insulin Pen Needle 32G X 4 MM MISC Used to inject insulin 1x daily. 100 each 4  . Insulin Syringe-Needle U-100 (INSULIN SYRINGE 1CC/30GX5/16") 30G X 5/16" 1 ML  MISC Use to inject insulin 1 time per day. 100 each 2  . magnesium oxide (MAG-OX) 400 MG tablet Take 200 mg by mouth daily.    . Menthol, Topical Analgesic, (BIOFREEZE ROLL-ON EX) Apply 1 application topically as needed (joint pain).    . metFORMIN (GLUCOPHAGE) 1000 MG tablet TAKE 1 TABLET TWICE DAILY WITH A MEAL 180 tablet 1  . Multiple Vitamin (MULTIVITAMIN) tablet Take 0.5 tablets by mouth 2 (two) times daily.     . niacin (NIASPAN) 500 MG CR tablet Take 1 tablet (500 mg total) by mouth at bedtime. 30 tablet 3  . Omega-3 Fatty Acids (FISH OIL) 1000 MG CAPS Take 2,000 mg by mouth daily.     . pantoprazole (PROTONIX) 40 MG tablet TAKE 1 TABLET EVERY DAY ON AN EMPTY STOMACH 90 tablet 1  . potassium chloride SA (KLOR-CON M20) 20 MEQ tablet Take 1 tablet (20 mEq total) by mouth daily. 90 tablet 1  . Probiotic Product (PROBIOTIC DAILY PO) Take 1 capsule by mouth daily.    . Semaglutide,0.25 or 0.5MG/DOS, (OZEMPIC, 0.25 OR 0.5 MG/DOSE,) 2 MG/1.5ML SOPN Inject 0.25 mg into the skin once a week. Can increase to 0.5 mg after 4 weeks 4 pen 3  . sodium chloride (OCEAN) 0.65 % SOLN nasal spray Place 1 spray into both nostrils as needed for congestion. 1 Bottle 0  . zinc gluconate 50 MG tablet Take 50 mg by mouth at bedtime.     No current facility-administered medications on file prior to visit.     Review of Systems:  As per HPI- otherwise negative. She is walking 3 miles a day 5 days a week No fever or cough  Physical Examination: There were no vitals filed for this visit. There were no vitals filed for this visit. There is no height or weight on file to calculate  BMI. Ideal Body Weight:    Not checking her BP at home.  She is checking her sugar however She sounds well, no cough, wheezing, distress is noted  BP Readings from Last 3 Encounters:  02/19/19 (!) 145/66  01/22/19 128/76  11/18/18 136/70    Assessment and Plan: Type 2 diabetes mellitus with complication, without long-term  current use of insulin (HCC) - Plan: Hemoglobin A1c, Microalbumin / creatinine urine ratio, CANCELED: Hemoglobin A1c, CANCELED: Microalbumin / creatinine urine ratio  Diabetic polyneuropathy associated with type 2 diabetes mellitus ([place])  Essential hypertension, benign  Dyslipidemia  Fatty liver - Plan: Hepatic function panel, CANCELED: Hepatic function panel  Follow-up visit done virtually today.  Mandy Kim has added Ozempic to her diabetes regimen and is tolerating this well.  Her neuropathy is well controlled Blood pressures generally fine, she is not on any antihypertensive medications Need to follow-up on her liver function She will come in next Wednesday or Thursday for a blood draw; her husband will come in as well-we will recheck her A1c  Spoke with pt for 6:40 today Signed Lamar Blinks, MD

## 2019-04-23 ENCOUNTER — Other Ambulatory Visit: Payer: Self-pay

## 2019-04-23 ENCOUNTER — Other Ambulatory Visit (INDEPENDENT_AMBULATORY_CARE_PROVIDER_SITE_OTHER): Payer: Medicare HMO

## 2019-04-23 DIAGNOSIS — E118 Type 2 diabetes mellitus with unspecified complications: Secondary | ICD-10-CM

## 2019-04-23 DIAGNOSIS — K760 Fatty (change of) liver, not elsewhere classified: Secondary | ICD-10-CM

## 2019-04-24 ENCOUNTER — Other Ambulatory Visit: Payer: Self-pay | Admitting: Urology

## 2019-04-24 LAB — HEMOGLOBIN A1C: Hgb A1c MFr Bld: 8.4 % — ABNORMAL HIGH (ref 4.6–6.5)

## 2019-04-24 LAB — HEPATIC FUNCTION PANEL
ALT: 80 U/L — ABNORMAL HIGH (ref 0–35)
AST: 67 U/L — ABNORMAL HIGH (ref 0–37)
Albumin: 4.5 g/dL (ref 3.5–5.2)
Alkaline Phosphatase: 45 U/L (ref 39–117)
Bilirubin, Direct: 0.1 mg/dL (ref 0.0–0.3)
Total Bilirubin: 0.3 mg/dL (ref 0.2–1.2)
Total Protein: 7 g/dL (ref 6.0–8.3)

## 2019-04-25 LAB — MICROALBUMIN / CREATININE URINE RATIO
Creatinine,U: 69.9 mg/dL
Microalb Creat Ratio: 3.1 mg/g (ref 0.0–30.0)
Microalb, Ur: 2.2 mg/dL — ABNORMAL HIGH (ref 0.0–1.9)

## 2019-04-29 ENCOUNTER — Telehealth: Payer: Self-pay | Admitting: Family Medicine

## 2019-04-29 DIAGNOSIS — R809 Proteinuria, unspecified: Secondary | ICD-10-CM

## 2019-04-29 DIAGNOSIS — R800 Isolated proteinuria: Secondary | ICD-10-CM

## 2019-04-29 MED ORDER — LISINOPRIL 5 MG PO TABS: 2.5000 mg | ORAL_TABLET | Freq: Every day | ORAL | 3 refills | Status: DC

## 2019-04-29 NOTE — Telephone Encounter (Signed)
Received her recent labs, contacted patient  Results for orders placed or performed in visit on 04/23/19  Microalbumin / creatinine urine ratio  Result Value Ref Range   Microalb, Ur 2.2 (H) 0.0 - 1.9 mg/dL   Creatinine,U 69.9 mg/dL   Microalb Creat Ratio 3.1 0.0 - 30.0 mg/g  Hepatic function panel  Result Value Ref Range   Total Bilirubin 0.3 0.2 - 1.2 mg/dL   Bilirubin, Direct 0.1 0.0 - 0.3 mg/dL   Alkaline Phosphatase 45 39 - 117 U/L   AST 67 (H) 0 - 37 U/L   ALT 80 (H) 0 - 35 U/L   Total Protein 7.0 6.0 - 8.3 g/dL   Albumin 4.5 3.5 - 5.2 g/dL  Hemoglobin A1c  Result Value Ref Range   Hgb A1c MFr Bld 8.4 (H) 4.6 - 6.5 %   She is on a low-dose of Ozempic, 0.25 mg A1c is stable though still above goal Most recent right upper quadrant ultrasound about 2 years ago-fatty liver She also did have a CT of her abdomen and pelvis in 2018 which showed diffuse fatty infiltration of the liver  Discussed her A1c and suggested that she increase dose of Ozempic.  However she declines to do this right now, due to potential side effects of medication She is willing to go on a low-dose of lisinopril for microalbuminuria-we will start her on 2.5 mg  Asked her to see me in 4 months for follow-up  BP Readings from Last 3 Encounters:  02/19/19 (!) 145/66  01/22/19 128/76  11/18/18 136/70

## 2019-05-04 ENCOUNTER — Other Ambulatory Visit: Payer: Self-pay | Admitting: Family Medicine

## 2019-05-04 DIAGNOSIS — Z5181 Encounter for therapeutic drug level monitoring: Secondary | ICD-10-CM

## 2019-05-07 ENCOUNTER — Other Ambulatory Visit: Payer: Self-pay | Admitting: Family Medicine

## 2019-05-07 ENCOUNTER — Other Ambulatory Visit: Payer: Self-pay | Admitting: Urology

## 2019-05-19 ENCOUNTER — Other Ambulatory Visit: Payer: Self-pay | Admitting: Family Medicine

## 2019-05-19 DIAGNOSIS — R809 Proteinuria, unspecified: Secondary | ICD-10-CM

## 2019-05-19 DIAGNOSIS — R800 Isolated proteinuria: Secondary | ICD-10-CM

## 2019-05-19 MED ORDER — LISINOPRIL 5 MG PO TABS: 2.5000 mg | ORAL_TABLET | Freq: Every day | ORAL | 3 refills | Status: DC

## 2019-05-19 NOTE — Progress Notes (Signed)
EKG 01-22-19 EPIC

## 2019-05-19 NOTE — Telephone Encounter (Unsigned)
Copied from [place] [phone]. Topic: Quick Communication - Rx Refill/Question >> May 19, 2019 12:40 PM Mathis Bud wrote: Medication:  lisinopril (ZESTRIL) 5 MG  Has the patient contacted their pharmacy? New pharmacy   Preferred Pharmacy (with phone number or street name): [facility], [place] [phone] (Phone) [phone] (Fax)    Agent: Please be advised that RX refills may take up to 3 business days. We ask that you follow-up with your pharmacy.

## 2019-05-19 NOTE — Patient Instructions (Addendum)
Mandy Kim     Your procedure is scheduled on: 05-23-2019   Report to [facility] Main  Entrance . Report to SHORT STAY at 5:30  AM    YOU NEED TO HAVE A COVID 19 TEST ON__6/16_____ @_2 :55______, THIS TEST MUST BE DONE BEFORE SURGERY, COME TO [place] ENTRANCE . ONCE YOUR COVID TEST IS COMPLETED, PLEASE BEGIN THE QUARANTINE INSTRUCTIONS AS OUTLINED IN YOUR HANDOUT.   Call this number if you have problems the morning of surgery [phone]    Remember: Do not eat food or drink liquids :After Midnight.            BRUSH YOUR TEETH MORNING OF SURGERY AND RINSE YOUR MOUTH OUT, NO CHEWING GUM CANDY OR MINTS.     Take these medicines the morning of surgery with A SIP OF WATER: GABAPENTIN (NEURONTIN)  DO NOT TAKE ANY DIABETIC MEDICATIONS DAY OF YOUR SURGERY        Bring mask and tubing for C-Pap     How to Manage Your Diabetes Before and After Surgery  Why is it important to control my blood sugar before and after surgery? . Improving blood sugar levels before and after surgery helps healing and can limit problems. . A way of improving blood sugar control is eating a healthy diet by: o  Eating less sugar and carbohydrates o  Increasing activity/exercise o  Talking with your doctor about reaching your blood sugar goals . High blood sugars (greater than 180 mg/dL) can raise your risk of infections and slow your recovery, so you will need to focus on controlling your diabetes during the weeks before surgery. . Make sure that the doctor who takes care of your diabetes knows about your planned surgery including the date and location.  How do I manage my blood sugar before surgery? . Check your blood sugar at least 4 times a day, starting 2 days before surgery, to make sure that the level is not too high or low. o Check your blood sugar the morning of your surgery when you wake up and every 2 hours until you get to the Short Stay  unit. . If your blood sugar is less than 70 mg/dL, you will need to treat for low blood sugar: o Do not take insulin. o Treat a low blood sugar (less than 70 mg/dL) with  cup of clear juice (cranberry or apple), 4 glucose tablets, OR glucose gel. o Recheck blood sugar in 15 minutes after treatment (to make sure it is greater than 70 mg/dL). If your blood sugar is not greater than 70 mg/dL on recheck, call [phone] for further instructions. . Report your blood sugar to the short stay nurse when you get to Short Stay.  . If you are admitted to the hospital after surgery: o Your blood sugar will be checked by the staff and you will probably be given insulin after surgery (instead of oral diabetes medicines) to make sure you have good blood sugar levels. o The goal for blood sugar control after surgery is 80-180 mg/dL.   WHAT DO I DO ABOUT MY DIABETES MEDICATION?  THE DAY BEFORE SURGERY TAKE METFORMIN AS USUAL  . THE NIGHT BEFORE SURGERY TAKE 1/2 USUAL BEDTIME DOSE OF TRESIBA (TAKE 30 UNITS)     . THE MORNING OF SURGERY DO NOT TAKE METFORMIN .  Marland Kitchen  Marland Kitchen  You may not have any metal on your body including hair pins and              piercings            Do not wear jewelry, make-up, lotions, powders or perfumes, deodorant             Do not wear nail polish.             Do not shave  48 hours prior to surgery.                 Do not bring valuables to the hospital. [place].  Contacts, dentures or bridgework may not be worn into surgery.                  _____________________________________________________________________             [facility] - Preparing for Surgery  Before surgery, you can play an important role .  Because skin is not sterile, your skin needs to be as free of germs as possible.  You can reduce the number of germs on your skin by washing with CHG (chlorahexidine gluconate) soap before surgery.    CHG is an antiseptic cleaner which kills germs and bonds with the skin to continue killing germs even after washing. Please DO NOT use if you have an allergy to CHG or antibacterial soaps .  If your skin becomes reddened/irritated stop using the CHG and inform your nurse when you arrive at Short Stay. Do not shave (including legs and underarms) for at least 48 hours prior to the first CHG shower.   You may shave your face/neck . Please follow these instructions carefully:  1.  Shower with CHG Soap the night before surgery and the  morning of Surgery.  2.  If you choose to wash your hair, wash your hair first as usual with your  normal  shampoo.  3.  After you shampoo, rinse your hair and body thoroughly to remove the  shampoo.                                        4.  Use CHG as you would any other liquid soap.  You can apply chg directly  to the skin and wash                       Gently with a scrungie or clean washcloth.  5.  Apply the CHG Soap to your body ONLY FROM THE NECK DOWN.   Do not use on face/ open                           Wound or open sores. Avoid contact with eyes, ears mouth and genitals (private parts).                       Wash face,  Genitals (private parts) with your normal soap.             6.  Wash thoroughly, paying special attention to the area where your surgery  will be performed.  7.  Thoroughly rinse your body with warm water from the neck down.  8.  DO NOT shower/wash with your normal soap after using and rinsing off  the CHG Soap.             9.  Pat yourself dry with a clean towel.            10.  Wear clean pajamas.            11.  Place clean sheets on your bed the night of your first shower and do not  sleep with pets . Day of Surgery : Do not apply any lotions/deodorants the morning of surgery.  Please wear clean clothes to the hospital/surgery center.   FAILURE TO FOLLOW THESE INSTRUCTIONS MAY RESULT IN THE CANCELLATION OF YOUR SURGERY PATIENT  SIGNATURE_________________________________  NURSE SIGNATURE__________________________________  ________________________________________________________________________

## 2019-05-19 NOTE — Telephone Encounter (Signed)
Rx faxed to [facility] mail order.

## 2019-05-20 ENCOUNTER — Encounter (HOSPITAL_COMMUNITY)
Admission: RE | Admit: 2019-05-20 | Discharge: 2019-05-20 | Disposition: A | Discharge status: Home / Self Care | Payer: Medicare HMO | Source: Ambulatory Visit | Attending: Urology | Admitting: Urology

## 2019-05-20 ENCOUNTER — Other Ambulatory Visit (HOSPITAL_COMMUNITY)
Admission: RE | Admit: 2019-05-20 | Discharge: 2019-05-20 | Disposition: A | Discharge status: Home / Self Care | Payer: Medicare HMO | Source: Ambulatory Visit | Attending: Urology | Admitting: Urology

## 2019-05-20 ENCOUNTER — Encounter (HOSPITAL_COMMUNITY): Payer: Self-pay

## 2019-05-20 ENCOUNTER — Other Ambulatory Visit: Payer: Self-pay

## 2019-05-20 DIAGNOSIS — Z8541 Personal history of malignant neoplasm of cervix uteri: Secondary | ICD-10-CM | POA: Diagnosis not present

## 2019-05-20 DIAGNOSIS — Z90722 Acquired absence of ovaries, bilateral: Secondary | ICD-10-CM | POA: Diagnosis not present

## 2019-05-20 DIAGNOSIS — Z79899 Other long term (current) drug therapy: Secondary | ICD-10-CM | POA: Insufficient documentation

## 2019-05-20 DIAGNOSIS — I509 Heart failure, unspecified: Secondary | ICD-10-CM | POA: Insufficient documentation

## 2019-05-20 DIAGNOSIS — G4733 Obstructive sleep apnea (adult) (pediatric): Secondary | ICD-10-CM | POA: Diagnosis not present

## 2019-05-20 DIAGNOSIS — Z9221 Personal history of antineoplastic chemotherapy: Secondary | ICD-10-CM | POA: Diagnosis not present

## 2019-05-20 DIAGNOSIS — E1122 Type 2 diabetes mellitus with diabetic chronic kidney disease: Secondary | ICD-10-CM | POA: Insufficient documentation

## 2019-05-20 DIAGNOSIS — Z9842 Cataract extraction status, left eye: Secondary | ICD-10-CM | POA: Diagnosis not present

## 2019-05-20 DIAGNOSIS — Z9841 Cataract extraction status, right eye: Secondary | ICD-10-CM | POA: Diagnosis not present

## 2019-05-20 DIAGNOSIS — N8189 Other female genital prolapse: Secondary | ICD-10-CM | POA: Diagnosis not present

## 2019-05-20 DIAGNOSIS — Z9010 Acquired absence of unspecified breast and nipple: Secondary | ICD-10-CM | POA: Insufficient documentation

## 2019-05-20 DIAGNOSIS — Z961 Presence of intraocular lens: Secondary | ICD-10-CM | POA: Insufficient documentation

## 2019-05-20 DIAGNOSIS — N189 Chronic kidney disease, unspecified: Secondary | ICD-10-CM | POA: Insufficient documentation

## 2019-05-20 DIAGNOSIS — Z87891 Personal history of nicotine dependence: Secondary | ICD-10-CM | POA: Diagnosis not present

## 2019-05-20 DIAGNOSIS — E785 Hyperlipidemia, unspecified: Secondary | ICD-10-CM | POA: Diagnosis not present

## 2019-05-20 DIAGNOSIS — Z794 Long term (current) use of insulin: Secondary | ICD-10-CM | POA: Insufficient documentation

## 2019-05-20 DIAGNOSIS — Z01818 Encounter for other preprocedural examination: Secondary | ICD-10-CM | POA: Insufficient documentation

## 2019-05-20 DIAGNOSIS — Z853 Personal history of malignant neoplasm of breast: Secondary | ICD-10-CM | POA: Insufficient documentation

## 2019-05-20 DIAGNOSIS — E1151 Type 2 diabetes mellitus with diabetic peripheral angiopathy without gangrene: Secondary | ICD-10-CM | POA: Insufficient documentation

## 2019-05-20 DIAGNOSIS — Z90710 Acquired absence of both cervix and uterus: Secondary | ICD-10-CM | POA: Diagnosis not present

## 2019-05-20 DIAGNOSIS — K219 Gastro-esophageal reflux disease without esophagitis: Secondary | ICD-10-CM | POA: Diagnosis not present

## 2019-05-20 LAB — COMPREHENSIVE METABOLIC PANEL
ALT: 93 U/L — ABNORMAL HIGH (ref 0–44)
AST: 130 U/L — ABNORMAL HIGH (ref 15–41)
Albumin: 4.5 g/dL (ref 3.5–5.0)
Alkaline Phosphatase: 39 U/L (ref 38–126)
Anion gap: 11 (ref 5–15)
BUN: 25 mg/dL — ABNORMAL HIGH (ref 8–23)
CO2: 24 mmol/L (ref 22–32)
Calcium: 9.6 mg/dL (ref 8.9–10.3)
Chloride: 107 mmol/L (ref 98–111)
Creatinine, Ser: 0.88 mg/dL (ref 0.44–1.00)
GFR calc Af Amer: >60 mL/min (ref >60)
GFR calc non Af Amer: >60 mL/min (ref >60)
Glucose, Bld: 118 mg/dL — ABNORMAL HIGH (ref 70–99)
Potassium: 4.3 mmol/L (ref 3.5–5.1)
Sodium: 142 mmol/L (ref 135–145)
Total Bilirubin: 0.2 mg/dL — ABNORMAL LOW (ref 0.3–1.2)
Total Protein: 7.8 g/dL (ref 6.5–8.1)

## 2019-05-20 LAB — CBC
HCT: 40.4 % (ref 36.0–46.0)
Hemoglobin: 13 g/dL (ref 12.0–15.0)
MCH: 29.1 pg (ref 26.0–34.0)
MCHC: 32.2 g/dL (ref 30.0–36.0)
MCV: 90.6 fL (ref 80.0–100.0)
Platelets: 290 10*3/uL (ref 150–400)
RBC: 4.46 MIL/uL (ref 3.87–5.11)
RDW: 14.9 % (ref 11.5–15.5)
WBC: 6.2 10*3/uL (ref 4.0–10.5)
nRBC: 0 % (ref 0.0–0.2)

## 2019-05-20 LAB — HEMOGLOBIN A1C
Hgb A1c MFr Bld: 7.4 % — ABNORMAL HIGH (ref 4.8–5.6)
Mean Plasma Glucose: 165.68 mg/dL

## 2019-05-20 LAB — GLUCOSE, CAPILLARY: Glucose-Capillary: 126 mg/dL — ABNORMAL HIGH (ref 70–99)

## 2019-05-21 LAB — NOVEL CORONAVIRUS, NAA (HOSP ORDER, SEND-OUT TO REF LAB; TAT 18-24 HRS): SARS-CoV-2, NAA: NOT DETECTED

## 2019-05-21 NOTE — Progress Notes (Signed)
Anesthesia Chart Review   Case: [id number] Date/Time: 05/23/19 0700   Procedure: XI ROBOTIC ASSISTED LAPAROSCOPIC SACROCOLPOPEXY (N/A )   Anesthesia type: General   Pre-op diagnosis: pelvic organ prolapse   Location: [facility]OM 03 / [facility]   Surgeon: Ardis Hughs, MD      DISCUSSION: 73 yo former smoker (quit 06/07/99) with h/o PONV, HLD, GERD, OSA on CPAP, PAD, CKD, AAA s/p repair 09/15/11, breast cancer (s/p mastectomy, chemo), cervical cancer (s/p TAH w/BSO), DM II, hiatal hernia, CHF, h/o fatty liver on [place] 4/18 (negative hep panel, LFTS stable), pelvic organ prolapse scheduled for above procedure 05/23/2019 with Dr. Louis Meckel.   Pt last seen by PCP, Lamar Blinks, MD, 04/16/2019.  Surgery previously rescheduled due to [place].  She was cleared by PCP previously, stable since that time.    Stress test ordered by PCP as part of preoperative evaluation on 01/29/19, "Negative stress test without evidence of ischemia at given workload."  Per Dr. Lorelei Pont, "she did fine onher stress test, I will clear her for her urological procedure."  Per Dr. Benedetto Goad with vascular surgery pt can hold Plavix 5 days prior to procedure.  Per Dr. Lillie Fragmin note 01/22/19, She had a follow-up visit with her vascular surgeons at [facility] in October. They found that her graft was stable, with no significant increase in the AAA surrounding the graft."    Pt can proceed with planned procedure barring acute status change.  VS: BP (!) 133/51   Pulse 73   Temp 36.8 C (Oral)   Resp 18   Ht _0  (1.626 m)   Wt 82.4 kg   SpO2 95%   BMI 31.17 kg/m   PROVIDERS: Copland, Gay Filler, MD is PCP   Grandville Silos, MD Vascular Surgeon with [facility]  LABS: Labs reviewed: Acceptable for surgery. (all labs ordered are listed, but only abnormal results are displayed)  Labs Reviewed  URINE CULTURE - Abnormal; Notable for the following components:      Result Value   Culture >=100,000 COLONIES/mL  KLEBSIELLA PNEUMONIAE (*)    All other components within normal limits  COMPREHENSIVE METABOLIC PANEL - Abnormal; Notable for the following components:   Glucose, Bld 118 (*)    BUN 25 (*)    AST 130 (*)    ALT 93 (*)    Total Bilirubin 0.2 (*)    All other components within normal limits  HEMOGLOBIN A1C - Abnormal; Notable for the following components:   Hgb A1c MFr Bld 7.4 (*)    All other components within normal limits  GLUCOSE, CAPILLARY - Abnormal; Notable for the following components:   Glucose-Capillary 126 (*)    All other components within normal limits  CBC  TYPE AND SCREEN     IMAGES:   EKG: 01/22/2019 Rate 65 bpm Sinus rhythm  Within normal limits  CV: Stress Test 01/29/2019  Blood pressure demonstrated a hypertensive response to exercise.  No T wave inversion was noted during stress.  There was no ST segment deviation noted during stress.  Blood pressure demonstrated a hypertensive response to exercise  Overall, the patient's exercise capacity was mildly impaired.  D[facility] Treadmill Score: low risk   Negative stress test without evidence of ischemia at given workload. Past Medical History:  Diagnosis Date  . Anticoagulant long-term use    plavix  . Cancer ([place])   . CHF (congestive heart failure) ([place]) 2000  . Chronic cystitis   . Chronic kidney disease   .  Diverticulitis   . GERD (gastroesophageal reflux disease)   . Hiatal hernia   . History of basal cell carcinoma excision    face/ nose right side  . History of bilateral breast cancer followed by oncologist until 2008,  per pt no recurrence   right 2002  and left 2003 s/p  mastectomy with completed chemo and tamexifen therapy   . History of cervical cancer 2000   s/p  TAH w/ BSO  . History of diverticulitis of colon   . Hyperlipidemia   . OSA on CPAP   . PAD (peripheral artery disease) ([place])   . Peripheral neuropathy   . PONV (postoperative nausea and vomiting)   . S/P AAA (abdominal  aortic aneurysm) repair 09/15/2011  . Stress incontinence due to pelvic organ prolapse   . Type 2 diabetes mellitus treated with insulin ([place])    followed by pcp  . Wears glasses     Past Surgical History:  Procedure Laterality Date  . ABDOMINAL HYSTERECTOMY  2000   w/  BSO,  and Bladder tacking abdominally  . CARPAL TUNNEL RELEASE Left 1999  . CATARACT EXTRACTION W/ INTRAOCULAR LENS  IMPLANT, BILATERAL  2015  . ENDOVASCULAR REPAIR/STENT GRAFT  09-15-2011    _0   . INCONTINENCE SURGERY  2007  approx.  Marland Kitchen MASTECTOMY Bilateral right 2002;  left 2003   lymph node dissection done only on right side  . TONSILLECTOMY  child  . TYMPANOPLASTY Right 1970s   "had my ear drum replacement with a plastic one"  . WRIST GANGLION EXCISION Left 1998    MEDICATIONS: . Accu-Chek Softclix Lancets lancets  . Alcohol Swabs (B-D SINGLE USE SWABS REGULAR) PADS  . Alcohol Swabs PADS  . b complex vitamins tablet  . Biotin 5 MG TABS  . Blood Glucose Calibration (GLUCOSE CONTROL) SOLN  . Blood Glucose Monitoring Suppl (ACCU-CHEK AVIVA PLUS) w/Device KIT  . cephALEXin (KEFLEX) 250 MG capsule  . clopidogrel (PLAVIX) 75 MG tablet  . Cranberry 500 MG TABS  . gabapentin (NEURONTIN) 100 MG capsule  . gemfibrozil (LOPID) 600 MG tablet  . glucose blood (TRUE METRIX BLOOD GLUCOSE TEST) test strip  . insulin degludec (TRESIBA FLEXTOUCH) 100 UNIT/ML SOPN FlexTouch Pen  . Insulin Pen Needle 32G X 4 MM MISC  . Insulin Syringe-Needle U-100 (INSULIN SYRINGE 1CC/30GX5/16") 30G X 5/16" 1 ML MISC  . KLOR-CON M20 20 MEQ tablet  . lisinopril (ZESTRIL) 5 MG tablet  . magnesium oxide (MAG-OX) 400 MG tablet  . Menthol, Topical Analgesic, (BIOFREEZE ROLL-ON EX)  . metFORMIN (GLUCOPHAGE) 1000 MG tablet  . Multiple Vitamin (MULTIVITAMIN) tablet  . niacin (NIASPAN) 500 MG CR tablet  . Omega-3 Fatty Acids (FISH OIL) 1000 MG CAPS  . pantoprazole (PROTONIX) 40 MG tablet  . Probiotic Product (PROBIOTIC DAILY PO)  .  Semaglutide,0.25 or 0.5MG/DOS, (OZEMPIC, 0.25 OR 0.5 MG/DOSE,) 2 MG/1.5ML SOPN  . sodium chloride (OCEAN) 0.65 % SOLN nasal spray  . VITAMIN D PO  . zinc gluconate 50 MG tablet   No current facility-administered medications for this encounter.     Maia Plan [facility] Pre-Surgical Testing [phone] 05/21/19 4:04 PM

## 2019-05-21 NOTE — Anesthesia Preprocedure Evaluation (Addendum)
Anesthesia Evaluation  Patient identified by MRN, date of birth, ID band Patient awake    Reviewed: Allergy & Precautions, NPO status , Patient's Chart, lab work & pertinent test results  History of Anesthesia Complications (+) PONV  Airway Mallampati: II  TM Distance: >3 FB Neck ROM: Full    Dental no notable dental hx.    Pulmonary sleep apnea , former smoker,    Pulmonary exam normal breath sounds clear to auscultation       Cardiovascular + Peripheral Vascular Disease and +CHF  Normal cardiovascular exam Rhythm:Regular Rate:Normal     Neuro/Psych  Neuromuscular disease negative psych ROS   GI/Hepatic Neg liver ROS, GERD  ,  Endo/Other  negative endocrine ROSdiabetes, Type 2  Renal/GU negative Renal ROS  negative genitourinary   Musculoskeletal negative musculoskeletal ROS (+)   Abdominal (+) + obese,   Peds negative pediatric ROS (+)  Hematology negative hematology ROS (+)   Anesthesia Other Findings   Reproductive/Obstetrics negative OB ROS                            Anesthesia Physical Anesthesia Plan  ASA: III  Anesthesia Plan: General   Post-op Pain Management:    Induction: Intravenous  PONV Risk Score and Plan: 4 or greater and Ondansetron, Dexamethasone, Midazolam, Scopolamine patch - Pre-op and Treatment may vary due to age or medical condition  Airway Management Planned: Oral ETT  Additional Equipment:   Intra-op Plan:   Post-operative Plan: Extubation in OR  Informed Consent: I have reviewed the patients History and Physical, chart, labs and discussed the procedure including the risks, benefits and alternatives for the proposed anesthesia with the patient or authorized representative who has indicated his/her understanding and acceptance.     Dental advisory given  Plan Discussed with: CRNA  Anesthesia Plan Comments: (See PAT note 05/20/2019, Konrad Felix, PA-C)       Anesthesia Quick Evaluation

## 2019-05-22 ENCOUNTER — Encounter (HOSPITAL_COMMUNITY): Payer: Self-pay | Admitting: Certified Registered Nurse Anesthetist

## 2019-05-23 ENCOUNTER — Other Ambulatory Visit: Payer: Self-pay

## 2019-05-23 ENCOUNTER — Encounter (HOSPITAL_COMMUNITY): Payer: Self-pay

## 2019-05-23 ENCOUNTER — Ambulatory Visit (HOSPITAL_COMMUNITY): Payer: Medicare HMO | Admitting: Certified Registered Nurse Anesthetist

## 2019-05-23 ENCOUNTER — Encounter (HOSPITAL_COMMUNITY)
Admission: RE | Disposition: A | Discharge status: Home / Self Care | Payer: Self-pay | Source: Home / Self Care | Attending: Urology

## 2019-05-23 ENCOUNTER — Inpatient Hospital Stay (HOSPITAL_COMMUNITY)
Admission: RE | Admit: 2019-05-23 | Discharge: 2019-05-25 | DRG: 748 | Disposition: A | Discharge status: Home / Self Care | Payer: Medicare HMO | Attending: Urology | Admitting: Urology

## 2019-05-23 ENCOUNTER — Ambulatory Visit (HOSPITAL_COMMUNITY): Payer: Medicare HMO | Admitting: Physician Assistant

## 2019-05-23 DIAGNOSIS — Z8249 Family history of ischemic heart disease and other diseases of the circulatory system: Secondary | ICD-10-CM | POA: Diagnosis not present

## 2019-05-23 DIAGNOSIS — N3020 Other chronic cystitis without hematuria: Secondary | ICD-10-CM | POA: Diagnosis present

## 2019-05-23 DIAGNOSIS — Z853 Personal history of malignant neoplasm of breast: Secondary | ICD-10-CM | POA: Diagnosis not present

## 2019-05-23 DIAGNOSIS — N736 Female pelvic peritoneal adhesions (postinfective): Secondary | ICD-10-CM | POA: Diagnosis present

## 2019-05-23 DIAGNOSIS — N189 Chronic kidney disease, unspecified: Secondary | ICD-10-CM | POA: Diagnosis present

## 2019-05-23 DIAGNOSIS — G629 Polyneuropathy, unspecified: Secondary | ICD-10-CM | POA: Diagnosis present

## 2019-05-23 DIAGNOSIS — G4733 Obstructive sleep apnea (adult) (pediatric): Secondary | ICD-10-CM | POA: Diagnosis present

## 2019-05-23 DIAGNOSIS — E785 Hyperlipidemia, unspecified: Secondary | ICD-10-CM | POA: Diagnosis present

## 2019-05-23 DIAGNOSIS — Z794 Long term (current) use of insulin: Secondary | ICD-10-CM

## 2019-05-23 DIAGNOSIS — N8111 Cystocele, midline: Principal | ICD-10-CM | POA: Diagnosis present

## 2019-05-23 DIAGNOSIS — K219 Gastro-esophageal reflux disease without esophagitis: Secondary | ICD-10-CM | POA: Diagnosis present

## 2019-05-23 DIAGNOSIS — E1122 Type 2 diabetes mellitus with diabetic chronic kidney disease: Secondary | ICD-10-CM | POA: Diagnosis present

## 2019-05-23 DIAGNOSIS — Z8541 Personal history of malignant neoplasm of cervix uteri: Secondary | ICD-10-CM

## 2019-05-23 DIAGNOSIS — I509 Heart failure, unspecified: Secondary | ICD-10-CM | POA: Diagnosis present

## 2019-05-23 DIAGNOSIS — Z7902 Long term (current) use of antithrombotics/antiplatelets: Secondary | ICD-10-CM | POA: Diagnosis not present

## 2019-05-23 DIAGNOSIS — Z1159 Encounter for screening for other viral diseases: Secondary | ICD-10-CM | POA: Diagnosis not present

## 2019-05-23 DIAGNOSIS — E1142 Type 2 diabetes mellitus with diabetic polyneuropathy: Secondary | ICD-10-CM | POA: Diagnosis not present

## 2019-05-23 DIAGNOSIS — N816 Rectocele: Secondary | ICD-10-CM | POA: Diagnosis present

## 2019-05-23 DIAGNOSIS — Z9889 Other specified postprocedural states: Secondary | ICD-10-CM

## 2019-05-23 DIAGNOSIS — E1151 Type 2 diabetes mellitus with diabetic peripheral angiopathy without gangrene: Secondary | ICD-10-CM | POA: Diagnosis present

## 2019-05-23 DIAGNOSIS — Z85828 Personal history of other malignant neoplasm of skin: Secondary | ICD-10-CM | POA: Diagnosis not present

## 2019-05-23 DIAGNOSIS — N819 Female genital prolapse, unspecified: Secondary | ICD-10-CM | POA: Diagnosis not present

## 2019-05-23 DIAGNOSIS — N8189 Other female genital prolapse: Secondary | ICD-10-CM | POA: Diagnosis not present

## 2019-05-23 DIAGNOSIS — N814 Uterovaginal prolapse, unspecified: Secondary | ICD-10-CM | POA: Diagnosis present

## 2019-05-23 DIAGNOSIS — I714 Abdominal aortic aneurysm, without rupture: Secondary | ICD-10-CM | POA: Diagnosis not present

## 2019-05-23 DIAGNOSIS — Z8679 Personal history of other diseases of the circulatory system: Secondary | ICD-10-CM

## 2019-05-23 DIAGNOSIS — Z87891 Personal history of nicotine dependence: Secondary | ICD-10-CM

## 2019-05-23 HISTORY — DX: Uterovaginal prolapse, unspecified: N81.4

## 2019-05-23 HISTORY — PX: ROBOTIC ASSISTED LAPAROSCOPIC SACROCOLPOPEXY: SHX5388

## 2019-05-23 LAB — GLUCOSE, CAPILLARY
Glucose-Capillary: 106 mg/dL — ABNORMAL HIGH (ref 70–99)
Glucose-Capillary: 177 mg/dL — ABNORMAL HIGH (ref 70–99)
Glucose-Capillary: 123 mg/dL — ABNORMAL HIGH (ref 70–99)
Glucose-Capillary: 160 mg/dL — ABNORMAL HIGH (ref 70–99)

## 2019-05-23 LAB — TYPE AND SCREEN
ABO/RH(D): A POS
Antibody Screen: NEGATIVE

## 2019-05-23 LAB — URINE CULTURE: Culture: >=100000 — AB

## 2019-05-23 LAB — HEMOGLOBIN AND HEMATOCRIT, BLOOD
HCT: 36.6 % (ref 36.0–46.0)
Hemoglobin: 11.8 g/dL — ABNORMAL LOW (ref 12.0–15.0)

## 2019-05-23 SURGERY — SACROCOLPOPEXY, ROBOT-ASSISTED, LAPAROSCOPIC
Anesthesia: General

## 2019-05-23 MED ORDER — TRAMADOL HCL 50 MG PO TABS: 50.0000 mg | ORAL_TABLET | Freq: Four times a day (QID) | ORAL | Status: DC | PRN

## 2019-05-23 MED ORDER — OXYCODONE HCL 5 MG/5ML PO SOLN: 5.0000 mg | Freq: Once | ORAL | Status: DC | PRN

## 2019-05-23 MED ORDER — MIDAZOLAM HCL 2 MG/2ML IJ SOLN
INTRAMUSCULAR | Status: AC
  Filled 2019-05-23: qty 2

## 2019-05-23 MED ORDER — ROCURONIUM BROMIDE 10 MG/ML (PF) SYRINGE
PREFILLED_SYRINGE | INTRAVENOUS | Status: AC
  Filled 2019-05-23: qty 10

## 2019-05-23 MED ORDER — BELLADONNA ALKALOIDS-OPIUM 16.2-60 MG RE SUPP: 1.0000 | Freq: Four times a day (QID) | RECTAL | Status: DC | PRN

## 2019-05-23 MED ORDER — DEXAMETHASONE SODIUM PHOSPHATE 4 MG/ML IJ SOLN
INTRAMUSCULAR | Status: DC | PRN
  Administered 2019-05-23: 5 mg via INTRAVENOUS

## 2019-05-23 MED ORDER — BUPIVACAINE-EPINEPHRINE 0.5% -1:200000 IJ SOLN
INTRAMUSCULAR | Status: AC
  Filled 2019-05-23: qty 1

## 2019-05-23 MED ORDER — NIACIN ER (ANTIHYPERLIPIDEMIC) 500 MG PO TBCR
500.0000 mg | EXTENDED_RELEASE_TABLET | Freq: Every day | ORAL | Status: DC
  Administered 2019-05-23 – 2019-05-24 (×2): 500 mg via ORAL
  Filled 2019-05-23 (×2): qty 1

## 2019-05-23 MED ORDER — ONDANSETRON HCL 4 MG/2ML IJ SOLN: 4.0000 mg | INTRAMUSCULAR | Status: DC | PRN

## 2019-05-23 MED ORDER — EPHEDRINE SULFATE-NACL 50-0.9 MG/10ML-% IV SOSY
PREFILLED_SYRINGE | INTRAVENOUS | Status: DC | PRN
  Administered 2019-05-23: 5 mg via INTRAVENOUS
  Administered 2019-05-23: 10 mg via INTRAVENOUS
  Administered 2019-05-23: 5 mg via INTRAVENOUS
  Administered 2019-05-23: 10 mg via INTRAVENOUS

## 2019-05-23 MED ORDER — FENTANYL CITRATE (PF) 100 MCG/2ML IJ SOLN
INTRAMUSCULAR | Status: AC
  Filled 2019-05-23: qty 2

## 2019-05-23 MED ORDER — OXYCODONE HCL 5 MG PO TABS: 5.0000 mg | ORAL_TABLET | Freq: Once | ORAL | Status: DC | PRN

## 2019-05-23 MED ORDER — SUGAMMADEX SODIUM 200 MG/2ML IV SOLN
INTRAVENOUS | Status: AC
  Filled 2019-05-23: qty 2

## 2019-05-23 MED ORDER — CLINDAMYCIN PHOSPHATE 2 % VA CREA
TOPICAL_CREAM | VAGINAL | Status: AC
  Filled 2019-05-23: qty 40

## 2019-05-23 MED ORDER — PROMETHAZINE HCL 25 MG/ML IJ SOLN: 6.2500 mg | INTRAMUSCULAR | Status: DC | PRN

## 2019-05-23 MED ORDER — DEXAMETHASONE SODIUM PHOSPHATE 10 MG/ML IJ SOLN
INTRAMUSCULAR | Status: AC
  Filled 2019-05-23: qty 1

## 2019-05-23 MED ORDER — LABETALOL HCL 5 MG/ML IV SOLN
INTRAVENOUS | Status: AC
  Filled 2019-05-23: qty 4

## 2019-05-23 MED ORDER — DIPHENHYDRAMINE HCL 50 MG/ML IJ SOLN: 12.5000 mg | Freq: Four times a day (QID) | INTRAMUSCULAR | Status: DC | PRN

## 2019-05-23 MED ORDER — POTASSIUM CHLORIDE CRYS ER 20 MEQ PO TBCR
20.0000 meq | EXTENDED_RELEASE_TABLET | Freq: Every day | ORAL | Status: DC
  Administered 2019-05-23 – 2019-05-25 (×3): 20 meq via ORAL
  Filled 2019-05-23 (×3): qty 1

## 2019-05-23 MED ORDER — SCOPOLAMINE 1 MG/3DAYS TD PT72
MEDICATED_PATCH | TRANSDERMAL | Status: DC | PRN
  Administered 2019-05-23: 1 via TRANSDERMAL

## 2019-05-23 MED ORDER — LABETALOL HCL 5 MG/ML IV SOLN
INTRAVENOUS | Status: DC | PRN
  Administered 2019-05-23 (×3): 5 mg via INTRAVENOUS

## 2019-05-23 MED ORDER — LIDOCAINE 2% (20 MG/ML) 5 ML SYRINGE
INTRAMUSCULAR | Status: DC | PRN
  Administered 2019-05-23: 60 mg via INTRAVENOUS

## 2019-05-23 MED ORDER — DOCUSATE SODIUM 100 MG PO CAPS
100.0000 mg | ORAL_CAPSULE | Freq: Two times a day (BID) | ORAL | Status: DC
  Administered 2019-05-23 – 2019-05-25 (×4): 100 mg via ORAL
  Filled 2019-05-23 (×5): qty 1

## 2019-05-23 MED ORDER — SUGAMMADEX SODIUM 200 MG/2ML IV SOLN
INTRAVENOUS | Status: DC | PRN
  Administered 2019-05-23: 200 mg via INTRAVENOUS

## 2019-05-23 MED ORDER — BUPIVACAINE LIPOSOME 1.3 % IJ SUSP
20.0000 mL | Freq: Once | INTRAMUSCULAR | Status: AC
  Administered 2019-05-23: 20 mL
  Filled 2019-05-23: qty 20

## 2019-05-23 MED ORDER — HYDRALAZINE HCL 20 MG/ML IJ SOLN
INTRAMUSCULAR | Status: AC
  Filled 2019-05-23: qty 1

## 2019-05-23 MED ORDER — SCOPOLAMINE 1 MG/3DAYS TD PT72
MEDICATED_PATCH | TRANSDERMAL | Status: AC
  Filled 2019-05-23: qty 1

## 2019-05-23 MED ORDER — PANTOPRAZOLE SODIUM 40 MG PO TBEC
40.0000 mg | DELAYED_RELEASE_TABLET | Freq: Every day | ORAL | Status: DC
  Administered 2019-05-23 – 2019-05-25 (×3): 40 mg via ORAL
  Filled 2019-05-23 (×3): qty 1

## 2019-05-23 MED ORDER — MIDAZOLAM HCL 5 MG/5ML IJ SOLN
INTRAMUSCULAR | Status: DC | PRN
  Administered 2019-05-23: 2 mg via INTRAVENOUS

## 2019-05-23 MED ORDER — MAGNESIUM OXIDE 400 (241.3 MG) MG PO TABS
200.0000 mg | ORAL_TABLET | Freq: Every day | ORAL | Status: DC
  Administered 2019-05-24 – 2019-05-25 (×2): 200 mg via ORAL
  Filled 2019-05-23 (×2): qty 1

## 2019-05-23 MED ORDER — DIPHENHYDRAMINE HCL 12.5 MG/5ML PO ELIX: 12.5000 mg | ORAL_SOLUTION | Freq: Four times a day (QID) | ORAL | Status: DC | PRN

## 2019-05-23 MED ORDER — HYDROMORPHONE HCL 1 MG/ML IJ SOLN
0.5000 mg | INTRAMUSCULAR | Status: DC | PRN
  Filled 2019-05-23: qty 1

## 2019-05-23 MED ORDER — SODIUM CHLORIDE 0.9 % IR SOLN
Status: DC | PRN
  Administered 2019-05-23: 1000 mL via INTRAVESICAL

## 2019-05-23 MED ORDER — GABAPENTIN 100 MG PO CAPS
100.0000 mg | ORAL_CAPSULE | Freq: Two times a day (BID) | ORAL | Status: DC
  Administered 2019-05-23 – 2019-05-25 (×4): 100 mg via ORAL
  Filled 2019-05-23 (×5): qty 1

## 2019-05-23 MED ORDER — HYDROMORPHONE HCL 1 MG/ML IJ SOLN: 0.2500 mg | INTRAMUSCULAR | Status: DC | PRN

## 2019-05-23 MED ORDER — FENTANYL CITRATE (PF) 250 MCG/5ML IJ SOLN
INTRAMUSCULAR | Status: AC
  Filled 2019-05-23: qty 5

## 2019-05-23 MED ORDER — LACTATED RINGERS IV SOLN
INTRAVENOUS | Status: DC
  Administered 2019-05-23 (×3): via INTRAVENOUS

## 2019-05-23 MED ORDER — HYDRALAZINE HCL 20 MG/ML IJ SOLN
INTRAMUSCULAR | Status: DC | PRN
  Administered 2019-05-23 (×2): 4 mg via INTRAVENOUS

## 2019-05-23 MED ORDER — INSULIN ASPART 100 UNIT/ML ~~LOC~~ SOLN
0.0000 [IU] | Freq: Three times a day (TID) | SUBCUTANEOUS | Status: DC
  Administered 2019-05-23 – 2019-05-25 (×5): 2 [IU] via SUBCUTANEOUS

## 2019-05-23 MED ORDER — ESTRADIOL 0.1 MG/GM VA CREA
TOPICAL_CREAM | VAGINAL | Status: AC
  Filled 2019-05-23: qty 42.5

## 2019-05-23 MED ORDER — PROPOFOL 500 MG/50ML IV EMUL
INTRAVENOUS | Status: DC | PRN
  Administered 2019-05-23: 25 ug/kg/min via INTRAVENOUS

## 2019-05-23 MED ORDER — SODIUM CHLORIDE 0.45 % IV SOLN
INTRAVENOUS | Status: DC
  Administered 2019-05-23 – 2019-05-24 (×3): via INTRAVENOUS

## 2019-05-23 MED ORDER — GEMFIBROZIL 600 MG PO TABS
600.0000 mg | ORAL_TABLET | Freq: Two times a day (BID) | ORAL | Status: DC
  Administered 2019-05-24 – 2019-05-25 (×3): 600 mg via ORAL
  Filled 2019-05-23 (×4): qty 1

## 2019-05-23 MED ORDER — BUPIVACAINE-EPINEPHRINE 0.5% -1:200000 IJ SOLN
INTRAMUSCULAR | Status: DC | PRN
  Administered 2019-05-23: 30 mL

## 2019-05-23 MED ORDER — CEFAZOLIN SODIUM-DEXTROSE 2-4 GM/100ML-% IV SOLN
2.0000 g | INTRAVENOUS | Status: AC
  Administered 2019-05-23: 2 g via INTRAVENOUS
  Filled 2019-05-23: qty 100

## 2019-05-23 MED ORDER — TRAMADOL HCL 50 MG PO TABS: 50.0000 mg | ORAL_TABLET | Freq: Four times a day (QID) | ORAL | 0 refills | Status: DC | PRN

## 2019-05-23 MED ORDER — CLOPIDOGREL BISULFATE 75 MG PO TABS: 75.0000 mg | ORAL_TABLET | Freq: Every day | ORAL | 3 refills | Status: DC

## 2019-05-23 MED ORDER — ROCURONIUM BROMIDE 10 MG/ML (PF) SYRINGE
PREFILLED_SYRINGE | INTRAVENOUS | Status: DC | PRN
  Administered 2019-05-23 (×2): 20 mg via INTRAVENOUS
  Administered 2019-05-23: 60 mg via INTRAVENOUS
  Administered 2019-05-23: 20 mg via INTRAVENOUS

## 2019-05-23 MED ORDER — PROPOFOL 10 MG/ML IV BOLUS
INTRAVENOUS | Status: AC
  Filled 2019-05-23: qty 20

## 2019-05-23 MED ORDER — LISINOPRIL 5 MG PO TABS
2.5000 mg | ORAL_TABLET | Freq: Every day | ORAL | Status: DC
  Administered 2019-05-23: 2.5 mg via ORAL
  Filled 2019-05-23 (×2): qty 1

## 2019-05-23 MED ORDER — LIDOCAINE 2% (20 MG/ML) 5 ML SYRINGE
INTRAMUSCULAR | Status: AC
  Filled 2019-05-23: qty 5

## 2019-05-23 MED ORDER — CIPROFLOXACIN HCL 500 MG PO TABS: 500.0000 mg | ORAL_TABLET | Freq: Two times a day (BID) | ORAL | 0 refills | Status: DC

## 2019-05-23 MED ORDER — PROPOFOL 10 MG/ML IV BOLUS
INTRAVENOUS | Status: AC
  Filled 2019-05-23: qty 40

## 2019-05-23 MED ORDER — EPHEDRINE 5 MG/ML INJ
INTRAVENOUS | Status: AC
  Filled 2019-05-23: qty 10

## 2019-05-23 MED ORDER — PROPOFOL 10 MG/ML IV BOLUS
INTRAVENOUS | Status: DC | PRN
  Administered 2019-05-23: 110 mg via INTRAVENOUS

## 2019-05-23 MED ORDER — ONDANSETRON HCL 4 MG/2ML IJ SOLN
INTRAMUSCULAR | Status: AC
  Filled 2019-05-23: qty 2

## 2019-05-23 MED ORDER — FENTANYL CITRATE (PF) 100 MCG/2ML IJ SOLN
INTRAMUSCULAR | Status: DC | PRN
  Administered 2019-05-23: 100 ug via INTRAVENOUS
  Administered 2019-05-23: 50 ug via INTRAVENOUS
  Administered 2019-05-23: 100 ug via INTRAVENOUS
  Administered 2019-05-23 (×2): 50 ug via INTRAVENOUS

## 2019-05-23 MED ORDER — DOCUSATE SODIUM 100 MG PO CAPS: 100.0000 mg | ORAL_CAPSULE | Freq: Two times a day (BID) | ORAL | 2 refills | Status: DC

## 2019-05-23 MED ORDER — CLINDAMYCIN PHOSPHATE 2 % VA CREA
TOPICAL_CREAM | VAGINAL | Status: DC | PRN
  Administered 2019-05-23: 1 via VAGINAL

## 2019-05-23 SURGICAL SUPPLY — 80 items
BAG URINE DRAINAGE (UROLOGICAL SUPPLIES) ×3 IMPLANT
CATH FOLEY 2WAY 5CC 16FR (CATHETERS)
CATH FOLEY 3WAY  5CC 16FR (CATHETERS) ×2
CATH FOLEY 3WAY 5CC 16FR (CATHETERS) ×1 IMPLANT
CATH URTH STD 16FR FL 2W DRN (CATHETERS) IMPLANT
CHLORAPREP W/TINT 26 (MISCELLANEOUS) ×3 IMPLANT
CLIP VESOLOCK LG 6/CT PURPLE (CLIP) ×3 IMPLANT
CLIP VESOLOCK MED LG 6/CT (CLIP) IMPLANT
COVER SURGICAL LIGHT HANDLE (MISCELLANEOUS) ×3 IMPLANT
COVER TIP SHEARS 8 DVNC (MISCELLANEOUS) ×2 IMPLANT
COVER TIP SHEARS 8MM DA VINCI (MISCELLANEOUS) ×4
COVER WAND RF STERILE (DRAPES) IMPLANT
DERMABOND ADVANCED (GAUZE/BANDAGES/DRESSINGS) ×2
DERMABOND ADVANCED .7 DNX12 (GAUZE/BANDAGES/DRESSINGS) ×1 IMPLANT
DRAIN CHANNEL RND F F (WOUND CARE) ×3 IMPLANT
DRAPE ARM DVNC X/XI (DISPOSABLE) ×4 IMPLANT
DRAPE COLUMN DVNC XI (DISPOSABLE) ×1 IMPLANT
DRAPE DA VINCI XI ARM (DISPOSABLE) ×8
DRAPE DA VINCI XI COLUMN (DISPOSABLE) ×2
DRAPE INCISE IOBAN 66X45 STRL (DRAPES) ×3 IMPLANT
DRAPE SHEET LG 3/4 BI-LAMINATE (DRAPES) ×6 IMPLANT
DRAPE SURG IRRIG POUCH 19X23 (DRAPES) ×3 IMPLANT
ELECT PENCIL ROCKER SW 15FT (MISCELLANEOUS) ×3 IMPLANT
ELECT REM PT RETURN 15FT ADLT (MISCELLANEOUS) ×3 IMPLANT
GAUZE 4X4 16PLY RFD (DISPOSABLE) ×3 IMPLANT
GLOVE BIO SURGEON STRL SZ 6.5 (GLOVE) ×6 IMPLANT
GLOVE BIO SURGEONS STRL SZ 6.5 (GLOVE) ×3
GLOVE BIOGEL M STRL SZ7.5 (GLOVE) ×9 IMPLANT
GLOVE BIOGEL PI IND STRL 7.0 (GLOVE) ×2 IMPLANT
GLOVE BIOGEL PI INDICATOR 7.0 (GLOVE) ×4
GOWN STRL REUS W/ TWL XL LVL3 (GOWN DISPOSABLE) ×2 IMPLANT
GOWN STRL REUS W/TWL LRG LVL3 (GOWN DISPOSABLE) ×9 IMPLANT
GOWN STRL REUS W/TWL XL LVL3 (GOWN DISPOSABLE) ×4
HOLDER FOLEY CATH W/STRAP (MISCELLANEOUS) ×3 IMPLANT
IRRIG SUCT STRYKERFLOW 2 WTIP (MISCELLANEOUS) ×3
IRRIGATION SUCT STRKRFLW 2 WTP (MISCELLANEOUS) ×1 IMPLANT
KIT BASIN OR (CUSTOM PROCEDURE TRAY) ×3 IMPLANT
KIT FLEXISEAL FECAL MGMNT (GAUZE/BANDAGES/DRESSINGS) ×3 IMPLANT
KIT TURNOVER KIT A (KITS) ×3 IMPLANT
LUBRICANT JELLY K Y 4OZ (MISCELLANEOUS) ×3 IMPLANT
MANIPULATOR UTERINE 4.5 ZUMI (MISCELLANEOUS) IMPLANT
MARKER SKIN DUAL TIP RULER LAB (MISCELLANEOUS) ×3 IMPLANT
MESH Y UPSYLON VAGINAL (Mesh General) ×3 IMPLANT
OCCLUDER COLPOPNEUMO (BALLOONS) IMPLANT
PACKING VAGINAL (PACKING) ×3 IMPLANT
PAD POSITIONING PINK XL (MISCELLANEOUS) ×3 IMPLANT
PLUG CATH AND CAP STER (CATHETERS) ×3 IMPLANT
PORT ACCESS TROCAR AIRSEAL 12 (TROCAR) IMPLANT
PORT ACCESS TROCAR AIRSEAL 5M (TROCAR)
POUCH SPECIMEN RETRIEVAL 10MM (ENDOMECHANICALS) IMPLANT
PROTECTOR NERVE ULNAR (MISCELLANEOUS) ×3 IMPLANT
SEAL CANN UNIV 5-8 DVNC XI (MISCELLANEOUS) ×4 IMPLANT
SEAL XI 5MM-8MM UNIVERSAL (MISCELLANEOUS) ×8
SET IRRIG Y TYPE TUR BLADDER L (SET/KITS/TRAYS/PACK) ×3 IMPLANT
SET TRI-LUMEN FLTR TB AIRSEAL (TUBING) IMPLANT
SHEET LAVH (DRAPES) ×3 IMPLANT
SOL PREP POV-IOD 4OZ 10% (MISCELLANEOUS) ×3 IMPLANT
SOLUTION ELECTROLUBE (MISCELLANEOUS) ×3 IMPLANT
SPONGE LAP 4X18 RFD (DISPOSABLE) ×3 IMPLANT
SUT ETHILON 3 0 PS 1 (SUTURE) ×3 IMPLANT
SUT MNCRL AB 4-0 PS2 18 (SUTURE) ×6 IMPLANT
SUT PROLENE 2 0 CT 1 (SUTURE) ×3 IMPLANT
SUT VIC AB 0 CT1 27 (SUTURE) ×2
SUT VIC AB 0 CT1 27XBRD ANTBC (SUTURE) ×1 IMPLANT
SUT VIC AB 2-0 SH 27 (SUTURE) ×10
SUT VIC AB 2-0 SH 27XBRD (SUTURE) ×5 IMPLANT
SUT VIC AB 3-0 SH 27 (SUTURE) ×2
SUT VIC AB 3-0 SH 27X BRD (SUTURE) IMPLANT
SUT VIC AB 3-0 SH 27XBRD (SUTURE) ×1 IMPLANT
SUT VICRYL 0 UR6 27IN ABS (SUTURE) ×3 IMPLANT
SYR 50ML LL SCALE MARK (SYRINGE) IMPLANT
SYR BULB IRRIGATION 50ML (SYRINGE) ×3 IMPLANT
SYRINGE 20CC LL (MISCELLANEOUS) ×3 IMPLANT
TAPE CLOTH 4X10 WHT NS (GAUZE/BANDAGES/DRESSINGS) ×3 IMPLANT
TOWEL OR 17X26 10 PK STRL BLUE (TOWEL DISPOSABLE) ×6 IMPLANT
TRAY LAPAROSCOPIC (CUSTOM PROCEDURE TRAY) ×3 IMPLANT
TROCAR BLADELESS OPT 5 100 (ENDOMECHANICALS) ×3 IMPLANT
TROCAR XCEL 12X100 BLDLESS (ENDOMECHANICALS) ×3 IMPLANT
TUBING INSUF HEATED (TUBING) ×3 IMPLANT
WATER STERILE IRR 1000ML POUR (IV SOLUTION) ×3 IMPLANT

## 2019-05-23 NOTE — H&P (Signed)
f/u for recurrent urinary tract infections  HPI: Mandy Kim is a 73 year-old female established patient who is here today for interval evaluation of recurrent UTIs.  The patient was last seen 12/16/2018.   The patient has been treated for a UTI since last visit. In fact, the patient has been treated 1 time(s) for infection. The patient is not currently taking suppressive antibiotics.   In the past the patient has taken suppressive abx. She has been on keflex. She is also taking cranberry tablets. The patient is taking a probiotic.   The patient has noted significant changes in their urinary tract symptoms since their last visit. She feels as if she does not empty her bladder with each void. She does not urinate more frequently than once every 2 hours in the daytime. She does have frequency and urgency. The patient is having urinary incontinence. She does have burning or discomfort when she urinates. The patient has not noticed visible blood in their urine since the last visit.   The patient does not have a history of constipation.   July 2019 Interval: The patient presents today for follow-up. She was last seen 6 months ago. She was doing well up until 1 month ago when she developed gross hematuria and symptoms of infection. She is placed on ciprofloxacin and her symptoms improved. Otherwise, the patient has had some deconditioning and weight gain. She states prolapse as not changed significantly. She continues to splint to void. However, she does feel as if she empties her bladder completely.  The patient returns today for follow-up of recurrent UTIs. At her last appointment we opted to proceed with suppression antibiotics given her history of pyelonephritis 2, one of them required admission. She was placed on nitrofurantoin. She was then switched to trimethoprim because of expense. In the past 6 months she has done very well without any infections over urinary tract symptoms.   The patient has  a history of pelvic organ prolapse and has had 2 transvaginal repairs, with recurrence. However currently she is emptying her bladder well and moving her bowels without issue.   August 13, 2018 - Recommended she take Macrodantin as a suppressive agent last office visit that she cannot afford the medication therefore she was switched to 250 mg cephalexin taken daily at bedtime.   12/16/18: C/o of right lower back pain with increased urinary frequency and painful burning urination for the past 48 hours. Denies fevers, gross hematuria. The patient has severe pelvic organ prolapse. She has had to prolapse repairs in the past. She also has a history of iliac aneurysms and is status post stent. She takes Plavix for this.   Intv: Patient had UDS this morning, which demonstrated no evidence of stress urinary incontinence. She did not also have any evidence of overactive bladder. She did have some incomplete emptying and weak stream. However, when her prolapse was reduced she seems to do better. The patient did have some back pain recently, this seems to have gotten better with treatment of her urinary tract infection. Currently she is not complaining of any symptoms. She is very eager to get rid of this prolapse. She is anxious to get the surgery scheduled.     ALLERGIES: Aspirin TABS Codeine Derivatives Demerol TABS NSAIDs OxyCODONE HCl CAPS    MEDICATIONS: Keflex 250 mg capsule 1 capsule PO Q HS  Biotin 2,500 mcg capsule Oral  Calcium TABS Oral  Clopidogrel 75 mg tablet Oral  Fish Oil CAPS Oral  Gemfibrozil 600  mg tablet Oral  Klor-Con M20 20 meq tablet, ext release, particles/crystals Oral  Lantus SoloStar 100 UNIT/ML Subcutaneous Solution Pen-injector Subcutaneous  MetFORMIN HCl - 500 MG Oral Tablet Oral  Multi Vitamin/Minerals TABS Oral  Pantoprazole Sodium 40 MG Oral Tablet Delayed Release Oral  Vitamin B Complex capsule Oral  Vitamin D3 50,000 unit capsule Oral     GU PSH: Complex  cystometrogram, w/ void pressure and urethral pressure profile studies, any technique - 12/30/2018 Complex Uroflow - 12/30/2018 Emg surf Electrd - 12/30/2018 Hysterectomy Unilat SO - 2015 Inject For cystogram - 12/30/2018 Intrabd voidng Press - 12/30/2018      PSH Notes: Tonsillectomy, Breast Surgery Mastectomy, Wrist Arthroscopy With Release Of Transverse Carpal Ligament, Wrist Surgery, Total Abdominal Hysterectomy, Ear Surgery   NON-GU PSH: Carpal tunnel surgery - 2015 Remove Tonsils - 2015    GU PMH: Incomplete bladder emptying - 12/30/2018 Urge incontinence - 12/30/2018 Urinary Tract Inf, Unspec site - 12/16/2018, Recurrent urinary tract infection, - 2016 Chronic cystitis (w/o hematuria) - 07/01/2018, - 01/14/2018, - 08/02/2017, - 04/16/2017 Stress Incontinence - 01/14/2018 Cystocele, midline, Cystocele, midline - 2016 Rectocele, Rectocele, female - 2016 Dysuria, Dysuria - 2015    NON-GU PMH: Encounter for general adult medical examination without abnormal findings, Encounter for preventive health examination - 2016 Diabetes Type 2, Diabetes mellitus - 2015 Personal history of other diseases of the digestive system, History of esophageal reflux - 2015 Personal history of other diseases of the musculoskeletal system and connective tissue, History of arthritis - 2015 Personal history of other diseases of the nervous system and sense organs, History of sleep apnea - 2015 Personal history of other endocrine, nutritional and metabolic disease, History of hypercholesterolemia - 2015    FAMILY HISTORY: cardiac disorder - Runs In Family Kidney Stones - Runs In Family malignant neoplasm - Runs In Family pneumonia - Runs In Family   SOCIAL HISTORY: Marital Status: Married Preferred Language: English; Ethnicity: Not Hispanic Or Latino; Race: White     Notes: Alcohol use, Former smoker, Mother deceased, Married, Retired, Number of children, Caffeine use   REVIEW OF SYSTEMS:    GU Review Female:    Patient reports leakage of urine, get up at night to urinate, and frequent urination. Patient denies stream starts and stops, burning /pain with urination, have to strain to urinate, being pregnant, hard to postpone urination, and trouble starting your stream.  Gastrointestinal (Upper):   Patient denies nausea, vomiting, and indigestion/ heartburn.  Gastrointestinal (Lower):   Patient denies diarrhea and constipation.  Constitutional:   Patient denies fever, night sweats, weight loss, and fatigue.  Skin:   Patient denies skin rash/ lesion and itching.  Eyes:   Patient denies blurred vision and double vision.  Ears/ Nose/ Throat:   Patient denies sore throat and sinus problems.  Hematologic/Lymphatic:   Patient denies swollen glands and easy bruising.  Cardiovascular:   Patient denies leg swelling and chest pains.  Respiratory:   Patient denies cough and shortness of breath.  Endocrine:   Patient denies excessive thirst.  Musculoskeletal:   Patient reports back pain. Patient denies joint pain.  Neurological:   Patient denies headaches and dizziness.  Psychologic:   Patient denies depression and anxiety.   VITAL SIGNS:      12/30/2018 03:35 PM  Weight 180 lb / 81.65 kg  BP 146/67 mmHg  Pulse 77 /min   GU PHYSICAL EXAMINATION:    External Genitalia: No hirsutism, no rash, no scarring, no cyst, no erythematous lesion, no  papular lesion, no blanched lesion, no warty lesion. No edema.  Urethral Meatus: Normal size. Normal position. No discharge.  Urethra: No tenderness, no mass, no scarring. No hypermobility. No leakage.  Vagina: Mild vaginal atrophy. Moderate rectocele. Large cystocele, midline. No stenosis. No enterocele.    MULTI-SYSTEM PHYSICAL EXAMINATION:    Constitutional: Well-nourished. No physical deformities. Normally developed. Good grooming.  Neck: Neck symmetrical, not swollen. Normal tracheal position.  Respiratory: Normal breath sounds. No labored breathing, no use of  accessory muscles.   Cardiovascular: Regular rate and rhythm. No murmur, no gallop. Normal temperature, normal extremity pulses, no swelling, no varicosities.   Lymphatic: No enlargement of neck, axillae, groin.  Skin: No paleness, no jaundice, no cyanosis. No lesion, no ulcer, no rash.  Neurologic / Psychiatric: Oriented to time, oriented to place, oriented to person. No depression, no anxiety, no agitation.  Gastrointestinal: No mass, no tenderness, no rigidity, non obese abdomen.  Eyes: Normal conjunctivae. Normal eyelids.  Ears, Nose, Mouth, and Throat: Left ear no scars, no lesions, no masses. Right ear no scars, no lesions, no masses. Nose no scars, no lesions, no masses. Normal hearing. Normal lips.  Musculoskeletal: Normal gait and station of head and neck.     PAST DATA REVIEWED:  Source Of History:  Patient  Records Review:   Previous Doctor Records, Previous Patient Records, POC Tool  Urodynamics Review:   Review Urodynamics Tests   PROCEDURES: None   ASSESSMENT:      ICD-10 Details  1 GU:   Chronic cystitis (w/o hematuria) - N30.20   2   Cystocele, midline - N81.11   3   Rectocele - N81.6    PLAN:           Document Letter(s):  Created for Patient: Clinical Summary         Notes:   The patient at this point is very eager to proceed with prolapse repair. She is tired of having perivaginal tenderness and urinary tract symptoms resulting from her prolapse. Her urodynamics today suggest that she you does not need a mid urethral sling at the time of her repair.   I went over robotic-assisted laparoscopic sacral colpopexy with the patient detail. I explained to the patient the rationale for the surgery. I also went over the placement of the laparoscopic ports. I detailed to her the surgery as well as the postoperative recovery time. I explained to the patient that she could expect to be in the hospital at least one or 2 nights. She will require 4 weeks of no heavy lifting, 6  weeks of no bending or twisting. She will not be able to use her vagina for 6 weeks. I discussed complications of the operation including injury to bowel, ureters, bladder. We also discussed the risk of failure as well as the complications of mesh. I explained to them the difference between transvaginal mesh and the mesh used for sacral colpopexy. I reassured them that there has not been an FDA warnings in regards to sacral colpopexy mesh. We will plan to get this prior to her surgery. I spent 45 minutes with the patient going over that ins and outs of the surgery and answering all her questions.   The patient will have to stop her Plavix, for which we will reach out to her primary care provider. She may need vascular surgery to help with this question.

## 2019-05-23 NOTE — Transfer of Care (Signed)
Immediate Anesthesia Transfer of Care Note  Patient: Mandy Kim  Procedure(s) Performed: XI ROBOTIC ASSISTED LAPAROSCOPIC SACROCOLPOPEXY (N/A )  Patient Location: PACU  Anesthesia Type:General  Level of Consciousness: drowsy  Airway & Oxygen Therapy: Patient Spontanous Breathing and Patient connected to face mask  Post-op Assessment: Report given to RN and Post -op Vital signs reviewed and stable  Post vital signs: Reviewed and stable  Last Vitals:  Vitals Value Taken Time  BP 103/49 05/23/19 1303  Temp    Pulse 66 05/23/19 1306  Resp 10 05/23/19 1306  SpO2 95 % 05/23/19 1306  Vitals shown include unvalidated device data.  Last Pain:  Vitals:   05/23/19 0600  PainSc: 5       Patients Stated Pain Goal: 4 (84/03/97 9536)  Complications: No apparent anesthesia complications

## 2019-05-23 NOTE — Op Note (Signed)
Preoperative diagnosis:  1. Pelvic organ prolapse   Postoperative diagnosis:  1. same  2. Intestinal adhesions  Procedure: 1. Robotic assisted laparoscopic sacrocolpopexy 2. Extensive lysis of adhensions  Surgeon: Ardis Hughs, MD 1st assistant: Debbrah Alar, PA-C Resident Surgeon: Arminda Resides, MD  Anesthesia: General  Complications: None  Intraoperative findings: The patient had adhesions to the pelvic sidewall require more then 60 minutes of lysis of adhesions.  [employer] Upsilon Y-Mesh placed, cut to specific vaginal length.   The patient had a significant amount of fat inside with the sacral promontory extremely deep.  The area was thickened and sticky with a lot of collateral vessels.     EBL: 500cc  Specimens: None  Indication: Mandy Kim is a 73 y.o. female patient with symptomatic pelvic organ prolapse.  After reviewing the management options for treatment, he elected to proceed with the above surgical procedure(s). We have discussed the potential benefits and risks of the procedure, side effects of the proposed treatment, the likelihood of the patient achieving the goals of the procedure, and any potential problems that might occur during the procedure or recuperation. Informed consent has been obtained.  Description of procedure:  A Foley catheter was then placed and placed to gravity drainage. I then made a periumbilical incision carrying the dissection down to the patient's fascia with electrocautery. Once to the fascia, the fascia was incised the peritoneum opened. A 58mm port was then placed into the abdomen. The abdomen was insufflated and the remaining ports placed under digital guidance. 2 ports were placed lateral to the umbilicus on the right proximally 10 cm apart. The most lateral port was approximately 3 cm above the anterior iliac spine. 2 additional ports were placed in the patient's right side in comparable positions to the most lateral  port on the right was a 12 mm port.the robot was then docked at an angle from the leg obliquely along the side of the left leg.  I spent the next 60 minutes freeing up the sigmoid from the right pelvic side wall as well as the small bowel out of the pelvis.   Once this was completed I started dissecting at the sacral promontory located 3 cm medial to the location where the ureter crosses over the iliac vessels at the pelvic brim.  The posterior peritoneum was incised and the sacral prominence cleared off an area taking care to avoid the middle sacral vessels and the iliac branches.   This was extremely deep and difficult to see/locate.     I then created a posterior peritoneal tunnel starting at the sacral promontory and tunneling down the right pelvic sidewall down into the pelvis breaking back through the posterior peritoneum around the vesico-vaginal junction posteriorly. I then continued the posterior dissection retracting down on the rectum and finding the avascular plane between the posterior vaginal wall and the rectum. I carried this dissection down as far as I could to along the area of the perineal body.  I then turned my attention to the anterior plane between the anterior vaginal wall and the bladder. I was able to obtain access to the avascular plane and with a combination of both monopolar cautery and blunt dissection was able to clean and nice down to the bladder neck. I then turned my attention back to the patient's uterus and skeletonized the right uterine artery and vein and then took this with a series of bipolar moves. I then performed a similar uterus pedicle ligation on the  left.   Mesh was measured at approximately 6.5cm anteriorly and 6.5 cm posteriorly and I cut this on the back table. The mesh was then placed into the patient's abdomen through the assistant port and the anterior leaf was secured down onto the anterior vaginal wall with the apex at the bladder neck. The  posterior leaf was then secured down on the posterior vaginal wall. These were sewn down with 2-0 Vicryl. Between 6 and 8 were done on each side. At this point I then went back to the previously dissected sacral promontory and posterior peritoneal tunnel and inserted a instrument through the tunnel and grasped the end of the mesh at the vaginal cuff and pull it up to the sacrum. I then checked to ensure that the sacral mesh was not too tight by performing a vaginal exam. I then secured the sacral leg of the mesh using a 0 Prolene. I then reapproximated the posterior peritoneum with a 2-0 Vicryl in a running fashion around the sacral promontory. The pelvic peritoneum was closed using a pursestring. The fascia of the 12 mm port was then closed with 0 Vicryl in a figure-of-eight fashion. The skin was closed with 4-0 Monocryl's. Dermabond was applied to the incision and exparel injected into the incisions.  The patient was subsequently extubated and returned to the PACU in excellent condition.   Ardis Hughs, M.D.

## 2019-05-23 NOTE — Anesthesia Postprocedure Evaluation (Signed)
Anesthesia Post Note  Patient: Mandy Kim  Procedure(s) Performed: XI ROBOTIC ASSISTED LAPAROSCOPIC SACROCOLPOPEXY (N/A )     Patient location during evaluation: PACU Anesthesia Type: General Level of consciousness: awake and alert Pain management: pain level controlled Vital Signs Assessment: post-procedure vital signs reviewed and stable Respiratory status: spontaneous breathing, nonlabored ventilation and respiratory function stable Cardiovascular status: blood pressure returned to baseline and stable Postop Assessment: no apparent nausea or vomiting Anesthetic complications: no    Last Vitals:  Vitals:   05/23/19 1345 05/23/19 1400  BP: 124/62 (!) 124/59  Pulse: 71 69  Resp: 16 14  Temp:    SpO2: 97% 91%    Last Pain:  Vitals:   05/23/19 1400  PainSc: Asleep                 Lynda Rainwater

## 2019-05-23 NOTE — Interval H&P Note (Signed)
History and Physical Interval Note:  05/23/2019 7:16 AM  Mandy Kim  has presented today for surgery, with the diagnosis of pelvic organ prolapse.  The various methods of treatment have been discussed with the patient and family. After consideration of risks, benefits and other options for treatment, the patient has consented to  Procedure(s): XI ROBOTIC ASSISTED LAPAROSCOPIC SACROCOLPOPEXY (N/A) as a surgical intervention.  The patient's history has been reviewed, patient examined, no change in status, stable for surgery.  I have reviewed the patient's chart and labs.  Questions were answered to the patient's satisfaction.     Ardis Hughs

## 2019-05-23 NOTE — Discharge Instructions (Signed)
Activity:  You are encouraged to ambulate frequently (about every hour during waking hours) to help prevent blood clots from forming in your legs or lungs.  In addition:  No lifting heavy objects (anything greater than 10 pounds).  No driving a car and limit long car rides.  No strenuous exercise, limits stairclimbing to a minimum.  Do not swim or soak in a bath tub for 2 weeks.  Do not place anything per vagina for 4 weeks.  Diet: You should advance your diet as instructed by your physician.  It will be normal to have some bloating, nausea, and abdominal discomfort intermittently.  Prescriptions:  You will be provided a prescription for pain medication to take as needed.  If your pain is not severe enough to require the prescription pain medication, you may take extra strength Tylenol instead which will have less side effects.  You should also take a prescribed stool softener to avoid straining with bowel movements as the prescription pain medication may constipate you.  Finish the Cipro as prescribed and then stop all antibiotics (including the nightly Keflex (cephalexin)).  Incisions: You may remove your dressing bandages 48 hours after surgery if not removed in the hospital.  You will either have some small staples or special tissue glue at each of the incision sites. Once the bandages are removed (if present), the incisions may stay open to air.  You may start showering (but not soaking or bathing in water) the 2nd day after surgery and the incisions simply need to be patted dry after the shower.  No additional care is needed.  What to call us about: You should call the office [phone]) if you develop fever > 101 or develop persistent vomiting. Activity:  You are encouraged to ambulate frequently (about every hour during waking hours) to help prevent blood clots from forming in your legs or lungs.  However, you should not engage in any heavy lifting (> 10-15 lbs), strenuous activity, or  straining.

## 2019-05-23 NOTE — Anesthesia Procedure Notes (Signed)
Procedure Name: Intubation Date/Time: 05/23/2019 7:33 AM Performed by: Claudia Desanctis, CRNA Pre-anesthesia Checklist: Patient identified, Emergency Drugs available, Suction available and Patient being monitored Patient Re-evaluated:Patient Re-evaluated prior to induction Oxygen Delivery Method: Circle system utilized Preoxygenation: Pre-oxygenation with 100% oxygen Induction Type: IV induction Ventilation: Mask ventilation without difficulty Laryngoscope Size: 2 and Miller Grade View: Grade I Tube type: Oral Tube size: 7.0 mm Number of attempts: 1 Airway Equipment and Method: Stylet Placement Confirmation: ETT inserted through vocal cords under direct vision,  positive ETCO2 and breath sounds checked- equal and bilateral Secured at: 21 cm Tube secured with: Tape Dental Injury: Teeth and Oropharynx as per pre-operative assessment  Comments: Teeth unchanged

## 2019-05-24 DIAGNOSIS — Z85828 Personal history of other malignant neoplasm of skin: Secondary | ICD-10-CM | POA: Diagnosis not present

## 2019-05-24 DIAGNOSIS — G4733 Obstructive sleep apnea (adult) (pediatric): Secondary | ICD-10-CM | POA: Diagnosis present

## 2019-05-24 DIAGNOSIS — K219 Gastro-esophageal reflux disease without esophagitis: Secondary | ICD-10-CM | POA: Diagnosis present

## 2019-05-24 DIAGNOSIS — N189 Chronic kidney disease, unspecified: Secondary | ICD-10-CM | POA: Diagnosis present

## 2019-05-24 DIAGNOSIS — I509 Heart failure, unspecified: Secondary | ICD-10-CM | POA: Diagnosis present

## 2019-05-24 DIAGNOSIS — G629 Polyneuropathy, unspecified: Secondary | ICD-10-CM | POA: Diagnosis present

## 2019-05-24 DIAGNOSIS — Z853 Personal history of malignant neoplasm of breast: Secondary | ICD-10-CM | POA: Diagnosis not present

## 2019-05-24 DIAGNOSIS — Z7902 Long term (current) use of antithrombotics/antiplatelets: Secondary | ICD-10-CM | POA: Diagnosis not present

## 2019-05-24 DIAGNOSIS — E785 Hyperlipidemia, unspecified: Secondary | ICD-10-CM | POA: Diagnosis present

## 2019-05-24 DIAGNOSIS — Z1159 Encounter for screening for other viral diseases: Secondary | ICD-10-CM | POA: Diagnosis not present

## 2019-05-24 DIAGNOSIS — N816 Rectocele: Secondary | ICD-10-CM | POA: Diagnosis present

## 2019-05-24 DIAGNOSIS — N8111 Cystocele, midline: Secondary | ICD-10-CM | POA: Diagnosis present

## 2019-05-24 DIAGNOSIS — Z8679 Personal history of other diseases of the circulatory system: Secondary | ICD-10-CM | POA: Diagnosis not present

## 2019-05-24 DIAGNOSIS — N819 Female genital prolapse, unspecified: Secondary | ICD-10-CM

## 2019-05-24 DIAGNOSIS — N3020 Other chronic cystitis without hematuria: Secondary | ICD-10-CM | POA: Diagnosis present

## 2019-05-24 DIAGNOSIS — Z8249 Family history of ischemic heart disease and other diseases of the circulatory system: Secondary | ICD-10-CM | POA: Diagnosis not present

## 2019-05-24 DIAGNOSIS — E1122 Type 2 diabetes mellitus with diabetic chronic kidney disease: Secondary | ICD-10-CM | POA: Diagnosis present

## 2019-05-24 DIAGNOSIS — N736 Female pelvic peritoneal adhesions (postinfective): Secondary | ICD-10-CM | POA: Diagnosis present

## 2019-05-24 DIAGNOSIS — Z8541 Personal history of malignant neoplasm of cervix uteri: Secondary | ICD-10-CM | POA: Diagnosis not present

## 2019-05-24 DIAGNOSIS — E1151 Type 2 diabetes mellitus with diabetic peripheral angiopathy without gangrene: Secondary | ICD-10-CM | POA: Diagnosis present

## 2019-05-24 DIAGNOSIS — Z794 Long term (current) use of insulin: Secondary | ICD-10-CM | POA: Diagnosis not present

## 2019-05-24 DIAGNOSIS — Z87891 Personal history of nicotine dependence: Secondary | ICD-10-CM | POA: Diagnosis not present

## 2019-05-24 HISTORY — DX: Female genital prolapse, unspecified: N81.9

## 2019-05-24 LAB — BASIC METABOLIC PANEL
Anion gap: 9 (ref 5–15)
BUN: 18 mg/dL (ref 8–23)
CO2: 24 mmol/L (ref 22–32)
Calcium: 8 mg/dL — ABNORMAL LOW (ref 8.9–10.3)
Chloride: 103 mmol/L (ref 98–111)
Creatinine, Ser: 0.91 mg/dL (ref 0.44–1.00)
GFR calc Af Amer: >60 mL/min (ref >60)
GFR calc non Af Amer: >60 mL/min (ref >60)
Glucose, Bld: 134 mg/dL — ABNORMAL HIGH (ref 70–99)
Potassium: 4.1 mmol/L (ref 3.5–5.1)
Sodium: 136 mmol/L (ref 135–145)

## 2019-05-24 LAB — HEMOGLOBIN AND HEMATOCRIT, BLOOD
HCT: 34.3 % — ABNORMAL LOW (ref 36.0–46.0)
Hemoglobin: 10.7 g/dL — ABNORMAL LOW (ref 12.0–15.0)

## 2019-05-24 LAB — GLUCOSE, CAPILLARY
Glucose-Capillary: 135 mg/dL — ABNORMAL HIGH (ref 70–99)
Glucose-Capillary: 145 mg/dL — ABNORMAL HIGH (ref 70–99)
Glucose-Capillary: 132 mg/dL — ABNORMAL HIGH (ref 70–99)
Glucose-Capillary: 137 mg/dL — ABNORMAL HIGH (ref 70–99)

## 2019-05-24 NOTE — Progress Notes (Signed)
1 Day Post-Op Subjective: Patient reports severe pain and nausea. Urine is clear. Patient is hypotensive this morning  Objective: Vital signs in last 24 hours: Temp:  [98.6 F (37 C)-99 F (37.2 C)] 99 F (37.2 C) (06/20 1359) Pulse Rate:  [64-86] 64 (06/20 1359) Resp:  [18-20] 20 (06/20 1359) BP: (87-100)/(46-49) 87/46 (06/20 1359) SpO2:  [88 %-96 %] 96 % (06/20 1359)  Intake/Output from previous day: 06/19 0701 - 06/20 0700 In: 3546.8 [P.O.:50; I.V.:3396.8; IV Piggyback:100] Out: 2750 [Urine:2250; Blood:500] Intake/Output this shift: Total I/O In: -  Out: 400 [Urine:400]  Physical Exam:  General:alert, cooperative and appears stated age GI: tenderness: suprapubic Female genitalia: not done Extremities: extremities normal, atraumatic, no cyanosis or edema  Lab Results: Recent Labs    05/23/19 1325 05/24/19 0544  HGB 11.8* 10.7*  HCT 36.6 34.3*   BMET Recent Labs    05/24/19 0544  NA 136  K 4.1  CL 103  CO2 24  GLUCOSE 134*  BUN 18  CREATININE 0.91  CALCIUM 8.0*   No results for input(s): LABPT, INR in the last 72 hours. No results for input(s): LABURIN in the last 72 hours. Results for orders placed or performed during the hospital encounter of 05/20/19  Novel Coronavirus, NAA (hospital order; send-out to ref lab)     Status: None   Collection Time: 05/20/19  3:09 PM   Specimen: Nasopharyngeal Swab; Respiratory  Result Value Ref Range Status   SARS-CoV-2, NAA NOT DETECTED NOT DETECTED Final    Comment: (NOTE) This test was developed and its performance characteristics determined by [employer]. This test has not been FDA cleared or approved. This test has been authorized by FDA under an Emergency Use Authorization (EUA). This test is only authorized for the duration of time the declaration that circumstances exist justifying the authorization of the emergency use of in vitro diagnostic tests for detection of SARS-CoV-2 virus and/or  diagnosis of COVID-19 infection under section 564(b)(1) of the Act, 21 U.S.C. 712WPY-0(D)(9), unless the authorization is terminated or revoked sooner. When diagnostic testing is negative, the possibility of a false negative result should be considered in the context of a patient's recent exposures and the presence of clinical signs and symptoms consistent with COVID-19. An individual without symptoms of COVID-19 and who is not shedding SARS-CoV-2 virus would expect to have a negative (not detected) result in this assay. Performed  At: [facility] [address] [place], [place] [id number] Rush Farmer MD ZJ:[id number]    [place]  Final    Comment: Performed at [facility], [place] [address] [place], [place] [address]    Studies/Results: No results found.  Assessment/Plan: POD#1 sacrocolpopexy  1. Increase IVF to 100cc/hr due to hypotension 2. Patient instructed to take pain medication if she is having pain 3. Likely discharge tomorrow    LOS: 0 days   Nicolette Bang 05/24/2019, 8:39 PM

## 2019-05-24 NOTE — Care Management Obs Status (Signed)
[facility] NOTIFICATION   Patient Details  Name: Mandy Kim MRN: [id number] Date of Birth: 22-Sep-1946   Medicare Observation Status Notification Given:  Yes    Joaquin Courts, RN 05/24/2019, 11:33 AM

## 2019-05-25 ENCOUNTER — Encounter (HOSPITAL_COMMUNITY): Payer: Self-pay | Admitting: Urology

## 2019-05-25 LAB — GLUCOSE, CAPILLARY
Glucose-Capillary: 130 mg/dL — ABNORMAL HIGH (ref 70–99)
Glucose-Capillary: 137 mg/dL — ABNORMAL HIGH (ref 70–99)

## 2019-05-25 NOTE — Discharge Summary (Signed)
Physician Discharge Summary  Patient ID: Mandy Kim MRN: [id number] DOB/AGE: 1946/05/03 73 y.o.  Admit date: 05/23/2019 Discharge date: 05/25/2019  Admission Diagnoses:  Cystocele  Discharge Diagnoses:  Active Problems:   Cystocele with prolapse   Prolapse of female pelvic organs   Past Medical History:  Diagnosis Date  . Anticoagulant long-term use    plavix  . Cancer ([place])   . CHF (congestive heart failure) ([place]) 2000  . Chronic cystitis   . Chronic kidney disease   . Diverticulitis   . GERD (gastroesophageal reflux disease)   . Hiatal hernia   . History of basal cell carcinoma excision    face/ nose right side  . History of bilateral breast cancer followed by oncologist until 2008,  per pt no recurrence   right 2002  and left 2003 s/p  mastectomy with completed chemo and tamexifen therapy   . History of cervical cancer 2000   s/p  TAH w/ BSO  . History of diverticulitis of colon   . Hyperlipidemia   . OSA on CPAP   . PAD (peripheral artery disease) ([place])   . Peripheral neuropathy   . PONV (postoperative nausea and vomiting)   . S/P AAA (abdominal aortic aneurysm) repair 09/15/2011  . Stress incontinence due to pelvic organ prolapse   . Type 2 diabetes mellitus treated with insulin ([place])    followed by pcp  . Wears glasses     Surgeries: Procedure(s): XI ROBOTIC ASSISTED LAPAROSCOPIC SACROCOLPOPEXY on 05/23/2019   Consultants (if any):   Discharged Condition: Improved  Hospital Course: Mandy Kim is an 73 y.o. female who was admitted 05/23/2019 with a diagnosis of <principal problem not specified> and went to the operating room on 05/23/2019 and underwent the above named procedures.    She was given perioperative antibiotics:  Anti-infectives (From admission, onward)   Start     Dose/Rate Route Frequency Ordered Stop   05/23/19 0535  ceFAZolin (ANCEF) IVPB 2g/100 mL premix     2 g 200 mL/hr over 30 Minutes Intravenous 30 min pre-op 05/23/19 0535  05/23/19 0746   05/23/19 0000  ciprofloxacin (CIPRO) 500 MG tablet     500 mg Oral 2 times daily 05/23/19 0731      .  She was given sequential compression devices, early ambulation, and SCDs for DVT prophylaxis.  She benefited maximally from the hospital stay and there were no complications.    Recent vital signs:  Vitals:   05/24/19 2108 05/25/19 0542  BP: (!) 116/51 128/69  Pulse: 78 79  Resp: 16 16  Temp: 99.3 F (37.4 C) 98.8 F (37.1 C)  SpO2: 93% 94%    Recent laboratory studies:  Lab Results  Component Value Date   HGB 10.7 (L) 05/24/2019   HGB 11.8 (L) 05/23/2019   HGB 13.0 05/20/2019   Lab Results  Component Value Date   WBC 6.2 05/20/2019   PLT 290 05/20/2019   Lab Results  Component Value Date   INR 1.09 05/28/2017   Lab Results  Component Value Date   NA 136 05/24/2019   K 4.1 05/24/2019   CL 103 05/24/2019   CO2 24 05/24/2019   BUN 18 05/24/2019   CREATININE 0.91 05/24/2019   GLUCOSE 134 (H) 05/24/2019    Discharge Medications:   Allergies as of 05/25/2019      Reactions   Aspirin    Per pt any pain medications - causes fluid build up    Bee Venom Swelling  Ibuprofen Swelling   Ozempic (0.25 Or 0.5 Mg-dose) [semaglutide(0.25 Or 0.5m-dos)] Diarrhea, Nausea And Vomiting   Flatulence   Poison Oak Extract Itching, Swelling   Statins    "skin feels like bugs under the skin"   Trulicity [dulaglutide] Nausea And Vomiting, Other (See Comments)   Flatulance, "deathly sick"   Tylenol [acetaminophen] Swelling   Victoza [liraglutide] Other (See Comments)   Stomach upset issues       Medication List    STOP taking these medications   cephALEXin 250 MG capsule Commonly known as: KEFLEX   Semaglutide(0.25 or 0.5MG/DOS) 2 MG/1.5ML Sopn Commonly known as: Ozempic (0.25 or 0.5 MG/DOSE)   sodium chloride 0.65 % Soln nasal spray Commonly known as: OCEAN     TAKE these medications   Accu-Chek Aviva Plus w/Device Kit USE AS DIRECTED  TO TEST  BLOOD GLUCOSE   Accu-Chek Softclix Lancets lancets TEST BLOOD SUGAR THREE TIMES DAILY   b complex vitamins tablet Take 0.5 tablets by mouth 2 (two) times daily.   B-D SINGLE USE SWABS REGULAR Pads USE TO TEST BLOOD SUGAR 3 TIMES DAILY.   Alcohol Swabs Pads Use to test blood sugar 3 times daily. Dx: E11.9   BIOFREEZE ROLL-ON EX Apply 1 application topically as needed (joint pain).   Biotin 5 MG Tabs Take 5,000 mcg by mouth at bedtime.   ciprofloxacin 500 MG tablet Commonly known as: Cipro Take 1 tablet (500 mg total) by mouth 2 (two) times daily.   clopidogrel 75 MG tablet Commonly known as: PLAVIX Take 1 tablet (75 mg total) by mouth daily. Resume 2 days after discharge from the hospital following surgery. What changed: additional instructions   Cranberry 500 MG Tabs Take 500 mg by mouth daily.   docusate sodium 100 MG capsule Commonly known as: Colace Take 1 capsule (100 mg total) by mouth 2 (two) times daily.   Fish Oil 1000 MG Caps Take 1,000 mg by mouth 2 (two) times a day.   gabapentin 100 MG capsule Commonly known as: NEURONTIN Take 1 capsule (100 mg total) by mouth 2 (two) times daily.   gemfibrozil 600 MG tablet Commonly known as: LOPID TAKE 1 TABLET (600 MG TOTAL) BY MOUTH 2 (TWO) TIMES DAILY BEFORE A MEAL. What changed:   how much to take  how to take this  when to take this  additional instructions   glucose blood test strip Commonly known as: True Metrix Blood Glucose Test TEST BLOOD SUGAR THREE TIMES DAILY   Glucose Control Soln Use to test blood sugar 3 times daily. Dx: E11.9   insulin degludec 100 UNIT/ML Sopn FlexTouch Pen Commonly known as: TTyler AasFlexTouch 60 units subque daily What changed:   how much to take  how to take this  when to take this   Insulin Pen Needle 32G X 4 MM Misc Used to inject insulin 1x daily.   INSULIN SYRINGE 1CC/30GX5/16" 30G X 5/16" 1 ML Misc Use to inject insulin 1 time per day.   Klor-Con  M20 20 MEQ tablet Generic drug: potassium chloride SA TAKE 1 TABLET BY MOUTH EVERY DAY What changed: how much to take   lisinopril 5 MG tablet Commonly known as: ZESTRIL Take 0.5 tablets (2.5 mg total) by mouth daily.   magnesium oxide 400 MG tablet Commonly known as: MAG-OX Take 200 mg by mouth daily.   metFORMIN 1000 MG tablet Commonly known as: GLUCOPHAGE TAKE 1 TABLET TWICE DAILY WITH A MEAL What changed: See the new instructions.  multivitamin tablet Take 0.5 tablets by mouth 2 (two) times daily.   niacin 500 MG CR tablet Commonly known as: NIASPAN Take 1 tablet (500 mg total) by mouth at bedtime.   pantoprazole 40 MG tablet Commonly known as: PROTONIX TAKE 1 TABLET EVERY DAY ON AN EMPTY STOMACH What changed: See the new instructions.   PROBIOTIC DAILY PO Take 1 capsule by mouth daily.   traMADol 50 MG tablet Commonly known as: Ultram Take 1-2 tablets (50-100 mg total) by mouth every 6 (six) hours as needed for moderate pain.   VITAMIN D PO Take 1 capsule by mouth daily.   zinc gluconate 50 MG tablet Take 50 mg by mouth at bedtime.       Diagnostic Studies: No results found.  Disposition: Discharge disposition: 01-Home or Self Care         Follow-up Information    Ardis Hughs, MD On 06/05/2019.   Specialty: Urology Why: 1:30, If symptoms worsen Contact information: [place] [id number] [phone]            Signed: Nicolette Bang 05/25/2019, 10:44 AM

## 2019-05-26 ENCOUNTER — Encounter (HOSPITAL_COMMUNITY): Payer: Self-pay | Admitting: Urology

## 2019-06-05 DIAGNOSIS — N8110 Cystocele, unspecified: Secondary | ICD-10-CM | POA: Diagnosis not present

## 2019-06-11 ENCOUNTER — Other Ambulatory Visit: Payer: Self-pay | Admitting: Family Medicine

## 2019-06-11 DIAGNOSIS — Z5181 Encounter for therapeutic drug level monitoring: Secondary | ICD-10-CM

## 2019-06-20 DIAGNOSIS — N8110 Cystocele, unspecified: Secondary | ICD-10-CM | POA: Diagnosis not present

## 2019-06-20 DIAGNOSIS — R300 Dysuria: Secondary | ICD-10-CM | POA: Diagnosis not present

## 2019-07-04 DIAGNOSIS — N8110 Cystocele, unspecified: Secondary | ICD-10-CM | POA: Diagnosis not present

## 2019-07-04 DIAGNOSIS — N3020 Other chronic cystitis without hematuria: Secondary | ICD-10-CM | POA: Diagnosis not present

## 2019-07-04 DIAGNOSIS — N3000 Acute cystitis without hematuria: Secondary | ICD-10-CM | POA: Diagnosis not present

## 2019-07-12 ENCOUNTER — Other Ambulatory Visit: Payer: Self-pay | Admitting: Family Medicine

## 2019-07-12 DIAGNOSIS — E118 Type 2 diabetes mellitus with unspecified complications: Secondary | ICD-10-CM

## 2019-07-12 DIAGNOSIS — K219 Gastro-esophageal reflux disease without esophagitis: Secondary | ICD-10-CM

## 2019-08-07 NOTE — Progress Notes (Addendum)
[facility] [place], [place], [place] [address] [phone]  Date:  08/18/2019   Name:  Mandy Kim   DOB:  [birth year]   MRN:  [id number]  PCP:  Darreld Mclean, MD    Chief Complaint: Follow-up   History of Present Illness:  Mandy Kim is a 73 y.o. very pleasant female patient who presents with the following:  Here today for follow-up visit History of unevenly controlled diabetes, hyperlipidemia, neuropathy, breast cancer, AAA status post repair 2013 Right breast cancer 2002, left breast cancer 2003, status post mastectomy, chemo, tamoxifen Also history of cervical cancer year 2000 status post hysterectomy Married to [place], [place]  She underwent repair of a cystocele with prolapse in June of this year per her urologist, Dr. Louis Meckel. She feels like this worked pretty well but she is having more bowel urgency She had to really rest for about 6 weeks which drove her crazy   I last saw Seidy in May-at that time she had Ozempic to her diabetes regimen and seemed to be having positive results Neuropathy was not bothersome Blood pressure okay with no medication  Plavix 75 Gabapentin Gemfibrozil Tresiba insulin- 70 u daily  Lisinopril -stopped taking this  Metformin thousand twice daily Niaspan ?  Ozempic- NOT taking this as it caused her to gave as and diarrhea  Her glucose is running 150- 160 or perhaps higher if she eats carbs   Flu shot- declines  Can do A1c today Not fasting today so will defer lipid panel   Wt Readings from Last 3 Encounters:  08/18/19 178 lb 9.6 oz (81 kg)  05/23/19 181 lb 9.6 oz (82.4 kg)  05/20/19 181 lb 9.6 oz (82.4 kg)   She notes that she did get down to 171 lbs but has gained back a few   Lab Results  Component Value Date   HGBA1C 7.4 (H) 05/20/2019     Patient Active Problem List   Diagnosis Date Noted  . Prolapse of female pelvic  organs 05/24/2019  . Cystocele with prolapse 05/23/2019  . Hypoxia   . Sepsis ([place]) 05/28/2017  . Elevated LFTs 05/28/2017  . Acute lower UTI 05/28/2017  . Dehydration 05/28/2017  . Peripheral neuropathy 07/27/2014  . AAA (abdominal aortic aneurysm) ([facility]) 02/12/2012  . Fatty liver 02/12/2012  . Breast cancer ([facility]) 02/12/2012  . Cervical cancer ([place]) 02/12/2012  . Diabetes mellitus ([place]) 02/12/2012  . Hyperlipidemia 02/12/2012    Past Medical History:  Diagnosis Date  . Anticoagulant long-term use    plavix  . Cancer ([facility])   . CHF (congestive heart failure) ([facility]) 2000  . Chronic cystitis   . Chronic kidney disease   . Diverticulitis   . GERD (gastroesophageal reflux disease)   . Hiatal hernia   . History of basal cell carcinoma excision    face/ nose right side  . History of bilateral breast cancer followed by oncologist until 2008,  per pt no recurrence   right 2002  and left 2003 s/p  mastectomy with completed chemo and tamexifen therapy   . History of cervical cancer 2000   s/p  TAH w/ BSO  . History of diverticulitis of colon   . Hyperlipidemia   . OSA on CPAP   . PAD (peripheral artery disease) ([facility])   . Peripheral neuropathy   . PONV (postoperative nausea and vomiting)   . S/P AAA (  abdominal aortic aneurysm) repair 09/15/2011  . Stress incontinence due to pelvic organ prolapse   . Type 2 diabetes mellitus treated with insulin ([facility])    followed by pcp  . Wears glasses     Past Surgical History:  Procedure Laterality Date  . ABDOMINAL HYSTERECTOMY  2000   w/  BSO,  and Bladder tacking abdominally  . CARPAL TUNNEL RELEASE Left 1999  . CATARACT EXTRACTION W/ INTRAOCULAR LENS  IMPLANT, BILATERAL  2015  . ENDOVASCULAR REPAIR/STENT GRAFT  09-15-2011    _0   . INCONTINENCE SURGERY  2007  approx.  Marland Kitchen MASTECTOMY Bilateral right 2002;  left 2003   lymph node dissection done only on right side  . ROBOTIC ASSISTED LAPAROSCOPIC SACROCOLPOPEXY N/A 05/23/2019    Procedure: XI ROBOTIC ASSISTED LAPAROSCOPIC SACROCOLPOPEXY;  Surgeon: Ardis Hughs, MD;  Location: [facility];  Service: Urology;  Laterality: N/A;  . TONSILLECTOMY  child  . TYMPANOPLASTY Right 1970s   "had my ear drum replacement with a plastic one"  . WRIST GANGLION EXCISION Left 1998    Social History   Tobacco Use  . Smoking status: Former Smoker    Years: 30.00    Types: Cigarettes    Quit date: 06/07/1999    Years since quitting: 20.2  . Smokeless tobacco: Never Used  Substance Use Topics  . Alcohol use: Never    Frequency: Never  . Drug use: Never    Family History  Problem Relation Age of Onset  . Cancer Mother   . Pneumonia Mother   . Thyroid disease Mother   . Thyroid disease Sister   . Obesity Daughter   . Hypertension Daughter   . Thyroid disease Son   . Pneumonia Maternal Grandmother   . Heart attack Maternal Grandfather   . Diabetes Neg Hx     Allergies  Allergen Reactions  . Aspirin     Per pt any pain medications - causes fluid build up   . Bee Venom Swelling  . Ibuprofen Swelling  . Ozempic (0.25 Or 0.5 Mg-Dose) [Semaglutide(0.25 Or 0.37m-Dos)] Diarrhea and Nausea And Vomiting    Flatulence  . Poison Oak Extract Itching and Swelling  . Statins     "skin feels like bugs under the skin"  . Trulicity [Dulaglutide] Nausea And Vomiting and Other (See Comments)    Flatulance, "deathly sick"  . Tylenol [Acetaminophen] Swelling  . Victoza [Liraglutide] Other (See Comments)    Stomach upset issues     Medication list has been reviewed and updated.  Current Outpatient Medications on File Prior to Visit  Medication Sig Dispense Refill  . Accu-Chek Softclix Lancets lancets TEST BLOOD SUGAR THREE TIMES DAILY 300 each 1  . Alcohol Swabs (B-D SINGLE USE SWABS REGULAR) PADS USE TO TEST BLOOD SUGAR 3 TIMES DAILY. 300 each 2  . Alcohol Swabs PADS Use to test blood sugar 3 times daily. Dx: E11.9 300 each 2  . b complex vitamins tablet Take 0.5 tablets by  mouth 2 (two) times daily.     . Biotin 5 MG TABS Take 5,000 mcg by mouth at bedtime.     . Blood Glucose Calibration (GLUCOSE CONTROL) SOLN Use to test blood sugar 3 times daily. Dx: E11.9 1 each 2  . Blood Glucose Monitoring Suppl (ACCU-CHEK AVIVA PLUS) w/Device KIT USE AS DIRECTED  TO TEST BLOOD GLUCOSE 1 kit 0  . ciprofloxacin (CIPRO) 500 MG tablet Take 1 tablet (500 mg total) by mouth 2 (two) times daily. 10 tablet 0  .  clopidogrel (PLAVIX) 75 MG tablet Take 1 tablet (75 mg total) by mouth daily. Resume 2 days after discharge from the hospital following surgery. 90 tablet 3  . Cranberry 500 MG TABS Take 500 mg by mouth daily.    Marland Kitchen gabapentin (NEURONTIN) 100 MG capsule Take 1 capsule (100 mg total) by mouth 2 (two) times daily. 180 capsule 3  . gemfibrozil (LOPID) 600 MG tablet TAKE 1 TABLET (600 MG TOTAL) BY MOUTH 2 (TWO) TIMES DAILY BEFORE A MEAL. (Patient taking differently: Take 600 mg by mouth 2 (two) times daily before a meal. ) 180 tablet 3  . glucose blood (TRUE METRIX BLOOD GLUCOSE TEST) test strip TEST BLOOD SUGAR THREE TIMES DAILY 300 each 12  . insulin degludec (TRESIBA FLEXTOUCH) 100 UNIT/ML SOPN FlexTouch Pen 60 units subque daily (Patient taking differently: Inject 60 Units into the skin at bedtime. 60 units subque daily) 3 pen 6  . Insulin Pen Needle 32G X 4 MM MISC Used to inject insulin 1x daily. 100 each 4  . Insulin Syringe-Needle U-100 (INSULIN SYRINGE 1CC/30GX5/16") 30G X 5/16" 1 ML MISC Use to inject insulin 1 time per day. 100 each 2  . magnesium oxide (MAG-OX) 400 MG tablet Take 200 mg by mouth daily.    . Menthol, Topical Analgesic, (BIOFREEZE ROLL-ON EX) Apply 1 application topically as needed (joint pain).    . metFORMIN (GLUCOPHAGE) 1000 MG tablet Take 1 tablet (1,000 mg total) by mouth 2 (two) times daily with a meal. 180 tablet 1  . Multiple Vitamin (MULTIVITAMIN) tablet Take 0.5 tablets by mouth 2 (two) times daily.     . niacin (NIASPAN) 500 MG CR tablet Take 1  tablet (500 mg total) by mouth at bedtime. 30 tablet 3  . Omega-3 Fatty Acids (FISH OIL) 1000 MG CAPS Take 1,000 mg by mouth 2 (two) times a day.     . pantoprazole (PROTONIX) 40 MG tablet Take 1 tablet (40 mg total) by mouth daily. 90 tablet 1  . potassium chloride SA (K-DUR) 20 MEQ tablet Take 1 tablet (20 mEq total) by mouth daily. 90 tablet 1  . Probiotic Product (PROBIOTIC DAILY PO) Take 1 capsule by mouth daily.    Marland Kitchen VITAMIN D PO Take 1 capsule by mouth daily.     Marland Kitchen zinc gluconate 50 MG tablet Take 50 mg by mouth at bedtime.     No current facility-administered medications on file prior to visit.     Review of Systems:  As per HPI- otherwise negative.   Physical Examination: Vitals:   08/18/19 1253  BP: (!) 128/52  Pulse: 66  Resp: 12  Temp: 97.9 F (36.6 C)  SpO2: 95%   Vitals:   08/18/19 1253  Weight: 178 lb 9.6 oz (81 kg)  Height: _0  (1.626 m)   Body mass index is 30.66 kg/m. Ideal Body Weight: Weight in (lb) to have BMI = 25: 145.3  GEN: WDWN, NAD, Non-toxic, A & O x 3, overweight, looks well- her normal self  HEENT: Atraumatic, Normocephalic. Neck supple. No masses, No LAD. Ears and Nose: No external deformity. CV: RRR, No M/G/R. No JVD. No thrill. No extra heart sounds.s/p bilateral mastectomy  PULM: CTA B, no wheezes, crackles, rhonchi. No retractions. No resp. distress. No accessory muscle use. ABD: S, NT, ND, +BS. No rebound. No HSM. EXTR: No c/c/e NEURO Normal gait.  PSYCH: Normally interactive. Conversant. Not depressed or anxious appearing.  Calm demeanor.  She has a superficial contusion on the  right shin where she hit herself with a chair.  Tetanus is UTD   Assessment and Plan: Type 2 diabetes mellitus with complication, without long-term current use of insulin (HCC) - Plan: Comprehensive metabolic panel, Hemoglobin A1c  Dyslipidemia  Essential hypertension, benign - Plan: CBC  Abdominal aortic aneurysm (AAA) without rupture (HCC)  Fatty  liver  Bilateral malignant neoplasm of breast in female, unspecified estrogen receptor status, unspecified site of breast (HCC)  Elevated liver function tests - Plan: Comprehensive metabolic panel  Here today to recheck her DM and BP BP is under good control She stopped taking ozempic - will check her A1c today and follow-up pending her results Declined flu shot today History of prior AAA repair - last CT 2 years ago stable    Signed Lamar Blinks, MD  BP Readings from Last 3 Encounters:  08/18/19 (!) 128/52  05/25/19 128/69  05/20/19 (!) 133/51   Received her labs, letter to pt  Results for orders placed or performed in visit on 08/18/19  Comprehensive metabolic panel  Result Value Ref Range   Sodium 137 135 - 145 mEq/L   Potassium 4.4 3.5 - 5.1 mEq/L   Chloride 101 96 - 112 mEq/L   CO2 24 19 - 32 mEq/L   Glucose, Bld 137 (H) 70 - 99 mg/dL   BUN 22 6 - 23 mg/dL   Creatinine, Ser 0.73 0.40 - 1.20 mg/dL   Total Bilirubin 0.3 0.2 - 1.2 mg/dL   Alkaline Phosphatase 33 (L) 39 - 117 U/L   AST 37 0 - 37 U/L   ALT 43 (H) 0 - 35 U/L   Total Protein 7.3 6.0 - 8.3 g/dL   Albumin 4.7 3.5 - 5.2 g/dL   Calcium 10.1 8.4 - 10.5 mg/dL   GFR 77.75 >60.00 mL/min  Hemoglobin A1c  Result Value Ref Range   Hgb A1c MFr Bld 7.4 (H) 4.6 - 6.5 %  CBC  Result Value Ref Range   WBC 6.3 4.0 - 10.5 K/uL   RBC 4.43 3.87 - 5.11 Mil/uL   Platelets 303.0 150.0 - 400.0 K/uL   Hemoglobin 13.0 12.0 - 15.0 g/dL   HCT 38.7 36.0 - 46.0 %   MCV 87.3 78.0 - 100.0 fl   MCHC 33.6 30.0 - 36.0 g/dL   RDW 15.5 11.5 - 15.5 %

## 2019-08-18 ENCOUNTER — Ambulatory Visit (INDEPENDENT_AMBULATORY_CARE_PROVIDER_SITE_OTHER): Payer: Medicare HMO | Admitting: Family Medicine

## 2019-08-18 ENCOUNTER — Encounter: Payer: Self-pay | Admitting: Family Medicine

## 2019-08-18 ENCOUNTER — Other Ambulatory Visit: Payer: Self-pay

## 2019-08-18 VITALS — BP 128/52 | HR 66 | Temp 97.9°F | Resp 12 | Ht 64.0 in | Wt 178.6 lb

## 2019-08-18 DIAGNOSIS — K760 Fatty (change of) liver, not elsewhere classified: Secondary | ICD-10-CM | POA: Diagnosis not present

## 2019-08-18 DIAGNOSIS — E785 Hyperlipidemia, unspecified: Secondary | ICD-10-CM

## 2019-08-18 DIAGNOSIS — R7989 Other specified abnormal findings of blood chemistry: Secondary | ICD-10-CM

## 2019-08-18 DIAGNOSIS — C50911 Malignant neoplasm of unspecified site of right female breast: Secondary | ICD-10-CM

## 2019-08-18 DIAGNOSIS — I714 Abdominal aortic aneurysm, without rupture, unspecified: Secondary | ICD-10-CM

## 2019-08-18 DIAGNOSIS — C50912 Malignant neoplasm of unspecified site of left female breast: Secondary | ICD-10-CM

## 2019-08-18 DIAGNOSIS — E118 Type 2 diabetes mellitus with unspecified complications: Secondary | ICD-10-CM | POA: Diagnosis not present

## 2019-08-18 DIAGNOSIS — I10 Essential (primary) hypertension: Secondary | ICD-10-CM

## 2019-08-18 DIAGNOSIS — R945 Abnormal results of liver function studies: Secondary | ICD-10-CM

## 2019-08-18 LAB — CBC
HCT: 38.7 % (ref 36.0–46.0)
Hemoglobin: 13 g/dL (ref 12.0–15.0)
MCHC: 33.6 g/dL (ref 30.0–36.0)
MCV: 87.3 fl (ref 78.0–100.0)
Platelets: 303 10*3/uL (ref 150.0–400.0)
RBC: 4.43 Mil/uL (ref 3.87–5.11)
RDW: 15.5 % (ref 11.5–15.5)
WBC: 6.3 10*3/uL (ref 4.0–10.5)

## 2019-08-18 LAB — COMPREHENSIVE METABOLIC PANEL
ALT: 43 U/L — ABNORMAL HIGH (ref 0–35)
AST: 37 U/L (ref 0–37)
Albumin: 4.7 g/dL (ref 3.5–5.2)
Alkaline Phosphatase: 33 U/L — ABNORMAL LOW (ref 39–117)
BUN: 22 mg/dL (ref 6–23)
CO2: 24 mEq/L (ref 19–32)
Calcium: 10.1 mg/dL (ref 8.4–10.5)
Chloride: 101 mEq/L (ref 96–112)
Creatinine, Ser: 0.73 mg/dL (ref 0.40–1.20)
GFR: 77.75 mL/min (ref >60.00)
Glucose, Bld: 137 mg/dL — ABNORMAL HIGH (ref 70–99)
Potassium: 4.4 mEq/L (ref 3.5–5.1)
Sodium: 137 mEq/L (ref 135–145)
Total Bilirubin: 0.3 mg/dL (ref 0.2–1.2)
Total Protein: 7.3 g/dL (ref 6.0–8.3)

## 2019-08-18 LAB — HEMOGLOBIN A1C: Hgb A1c MFr Bld: 7.4 % — ABNORMAL HIGH (ref 4.6–6.5)

## 2019-08-18 NOTE — Patient Instructions (Addendum)
Good to see you again today!  I will be in touch with your labs asap- we will see how your A1c looks If need be we can adjust your medications Your weight is actually stable  Please consider getting a flu shot later on this year  You might also want to get the shingles vaccine at your convenience at the drug store

## 2019-08-20 DIAGNOSIS — Z48812 Encounter for surgical aftercare following surgery on the circulatory system: Secondary | ICD-10-CM | POA: Diagnosis not present

## 2019-08-20 DIAGNOSIS — I714 Abdominal aortic aneurysm, without rupture: Secondary | ICD-10-CM | POA: Diagnosis not present

## 2019-09-08 DIAGNOSIS — R3914 Feeling of incomplete bladder emptying: Secondary | ICD-10-CM | POA: Diagnosis not present

## 2019-09-08 DIAGNOSIS — N8110 Cystocele, unspecified: Secondary | ICD-10-CM | POA: Diagnosis not present

## 2019-09-08 DIAGNOSIS — N816 Rectocele: Secondary | ICD-10-CM | POA: Diagnosis not present

## 2019-09-29 DIAGNOSIS — K5904 Chronic idiopathic constipation: Secondary | ICD-10-CM | POA: Diagnosis not present

## 2019-09-29 DIAGNOSIS — N8110 Cystocele, unspecified: Secondary | ICD-10-CM | POA: Diagnosis not present

## 2019-10-01 ENCOUNTER — Other Ambulatory Visit: Payer: Self-pay | Admitting: Family Medicine

## 2019-10-01 DIAGNOSIS — Z9889 Other specified postprocedural states: Secondary | ICD-10-CM

## 2019-10-14 DIAGNOSIS — R152 Fecal urgency: Secondary | ICD-10-CM | POA: Diagnosis not present

## 2019-10-14 DIAGNOSIS — M6289 Other specified disorders of muscle: Secondary | ICD-10-CM | POA: Diagnosis not present

## 2019-10-14 DIAGNOSIS — M62838 Other muscle spasm: Secondary | ICD-10-CM | POA: Diagnosis not present

## 2019-10-14 DIAGNOSIS — M6281 Muscle weakness (generalized): Secondary | ICD-10-CM | POA: Diagnosis not present

## 2019-10-14 DIAGNOSIS — R151 Fecal smearing: Secondary | ICD-10-CM | POA: Diagnosis not present

## 2019-10-14 DIAGNOSIS — N3941 Urge incontinence: Secondary | ICD-10-CM | POA: Diagnosis not present

## 2019-10-14 DIAGNOSIS — N393 Stress incontinence (female) (male): Secondary | ICD-10-CM | POA: Diagnosis not present

## 2019-10-20 ENCOUNTER — Other Ambulatory Visit: Payer: Self-pay | Admitting: Family Medicine

## 2019-10-20 DIAGNOSIS — E785 Hyperlipidemia, unspecified: Secondary | ICD-10-CM

## 2019-10-31 ENCOUNTER — Other Ambulatory Visit: Payer: Self-pay | Admitting: Family Medicine

## 2019-10-31 DIAGNOSIS — Z5181 Encounter for therapeutic drug level monitoring: Secondary | ICD-10-CM

## 2019-11-06 ENCOUNTER — Other Ambulatory Visit: Payer: Self-pay | Admitting: Medical

## 2019-11-06 ENCOUNTER — Other Ambulatory Visit: Payer: Self-pay | Admitting: Family Medicine

## 2019-11-12 ENCOUNTER — Telehealth: Payer: Self-pay | Admitting: Family Medicine

## 2019-11-12 ENCOUNTER — Other Ambulatory Visit: Payer: Self-pay | Admitting: Family Medicine

## 2019-11-12 DIAGNOSIS — Z5181 Encounter for therapeutic drug level monitoring: Secondary | ICD-10-CM

## 2019-11-12 MED ORDER — POTASSIUM CHLORIDE CRYS ER 20 MEQ PO TBCR: 20.0000 meq | EXTENDED_RELEASE_TABLET | Freq: Every day | ORAL | 1 refills | Status: DC

## 2019-11-12 NOTE — Telephone Encounter (Signed)
Medication Refill - Medication: potassium chloride SA (KLOR-CON) 20 MEQ tablet [id number]     Preferred Pharmacy (with phone number or street name):  [facility], [address]  [place] [address]  Phone: [phone] Fax: [phone]    Agent: Please be advised that RX refills may take up to 3 business days. We ask that you follow-up with your pharmacy.

## 2019-11-12 NOTE — Telephone Encounter (Signed)
Medication refilled-patient made aware verbalized understanding.

## 2019-11-12 NOTE — Telephone Encounter (Signed)
potassium chloride SA (K-DUR) 20 MEQ tablet  Pt is waiting a call back on why this med was not refilled, states has cramps really bad call back at [phone]

## 2019-11-29 ENCOUNTER — Other Ambulatory Visit: Payer: Self-pay | Admitting: Family Medicine

## 2019-11-29 DIAGNOSIS — K219 Gastro-esophageal reflux disease without esophagitis: Secondary | ICD-10-CM

## 2019-11-29 DIAGNOSIS — E118 Type 2 diabetes mellitus with unspecified complications: Secondary | ICD-10-CM

## 2019-12-17 ENCOUNTER — Other Ambulatory Visit: Payer: Self-pay

## 2019-12-17 NOTE — Progress Notes (Addendum)
[facility] [place], [place], [place] [address] [phone]  Date:  12/18/2019   Name:  Mandy Kim   DOB:  [birth year]   MRN:  [id number]  PCP:  Darreld Mclean, MD    Chief Complaint: Diabetes   History of Present Illness:  Mandy Kim is a 74 y.o. very pleasant female patient who presents with the following:  Here today for in person visit/diabetes follow-up History of breast cancer, cervical cancer, diabetes, hyperlipidemia, peripheral neuropathy, AAA status post repair 2013, bladder prolapse Cancer right breast 2002, left breast 2003, cervical cancer 2000 Last seen by myself in September-at that time her A1c was 7.4%  She has been seeing urology regarding her cystocele- she had a repair in June of last year The surgery help with her urinary incontinence, but she is now having fecal incontinence several times a day.  She has been seeing a physical therapist who is helping her son with this problem She feels like her bowels "get backed up and get full, and then I have diarrhea" She is not seeing GI- just urology so far She has abdominal distention-chronic not acute, seems to have a ventral hernia as well  Today needs repeat A1c, lipid panel  She tells me that her glucose is running about 200.  She then later tells me it sometimes might be 300 or 400 depending what she is eating  Plavix Gabapentin Gemfibrozil Tresiba flex touch pen- taking 70 units right now  Metformin Niaspan Potassium 20 mEq daily   Patient Active Problem List   Diagnosis Date Noted  . Prolapse of female pelvic organs 05/24/2019  . Cystocele with prolapse 05/23/2019  . Hypoxia   . Sepsis ([facility]) 05/28/2017  . Elevated LFTs 05/28/2017  . Acute lower UTI 05/28/2017  . Dehydration 05/28/2017  . Peripheral neuropathy 07/27/2014  . AAA (abdominal aortic aneurysm) ([facility]) 02/12/2012  . Fatty liver 02/12/2012  . Breast cancer  ([facility]) 02/12/2012  . Cervical cancer ([facility]) 02/12/2012  . Diabetes mellitus ([facility]) 02/12/2012  . Hyperlipidemia 02/12/2012    Past Medical History:  Diagnosis Date  . Anticoagulant long-term use    plavix  . Cancer ([facility])   . CHF (congestive heart failure) ([facility]) 2000  . Chronic cystitis   . Chronic kidney disease   . Diverticulitis   . GERD (gastroesophageal reflux disease)   . Hiatal hernia   . History of basal cell carcinoma excision    face/ nose right side  . History of bilateral breast cancer followed by oncologist until 2008,  per pt no recurrence   right 2002  and left 2003 s/p  mastectomy with completed chemo and tamexifen therapy   . History of cervical cancer 2000   s/p  TAH w/ BSO  . History of diverticulitis of colon   . Hyperlipidemia   . OSA on CPAP   . PAD (peripheral artery disease) ([facility])   . Peripheral neuropathy   . PONV (postoperative nausea and vomiting)   . S/P AAA (abdominal aortic aneurysm) repair 09/15/2011  . Stress incontinence due to pelvic organ prolapse   . Type 2 diabetes mellitus treated with insulin ([facility])    followed by pcp  . Wears glasses     Past Surgical History:  Procedure Laterality Date  . ABDOMINAL HYSTERECTOMY  2000   w/  BSO,  and Bladder tacking abdominally  . CARPAL TUNNEL RELEASE Left 1999  . CATARACT  EXTRACTION W/ INTRAOCULAR LENS  IMPLANT, BILATERAL  2015  . ENDOVASCULAR REPAIR/STENT GRAFT  09-15-2011    @[email or web address]   . INCONTINENCE SURGERY  2007  approx.  Marland Kitchen MASTECTOMY Bilateral right 2002;  left 2003   lymph node dissection done only on right side  . ROBOTIC ASSISTED LAPAROSCOPIC SACROCOLPOPEXY N/A 05/23/2019   Procedure: XI ROBOTIC ASSISTED LAPAROSCOPIC SACROCOLPOPEXY;  Surgeon: Ardis Hughs, MD;  Location: [facility];  Service: Urology;  Laterality: N/A;  . TONSILLECTOMY  child  . TYMPANOPLASTY Right 1970s   "had my ear drum replacement with a plastic one"  . WRIST GANGLION EXCISION Left 1998    Social History   Tobacco  Use  . Smoking status: Former Smoker    Years: 30.00    Types: Cigarettes    Quit date: 06/07/1999    Years since quitting: 20.5  . Smokeless tobacco: Never Used  Substance Use Topics  . Alcohol use: Never  . Drug use: Never    Family History  Problem Relation Age of Onset  . Cancer Mother   . Pneumonia Mother   . Thyroid disease Mother   . Thyroid disease Sister   . Obesity Daughter   . Hypertension Daughter   . Thyroid disease Son   . Pneumonia Maternal Grandmother   . Heart attack Maternal Grandfather   . Diabetes Neg Hx     Allergies  Allergen Reactions  . Aspirin     Per pt any pain medications - causes fluid build up   . Bee Venom Swelling  . Ibuprofen Swelling  . Ozempic (0.25 Or 0.5 Mg-Dose) [Semaglutide(0.25 Or 0.78m-Dos)] Diarrhea and Nausea And Vomiting    Flatulence  . Poison Oak Extract Itching and Swelling  . Statins     "skin feels like bugs under the skin"  . Trulicity [Dulaglutide] Nausea And Vomiting and Other (See Comments)    Flatulance, "deathly sick"  . Tylenol [Acetaminophen] Swelling  . Victoza [Liraglutide] Other (See Comments)    Stomach upset issues     Medication list has been reviewed and updated.  Current Outpatient Medications on File Prior to Visit  Medication Sig Dispense Refill  . Accu-Chek Softclix Lancets lancets TEST BLOOD SUGAR THREE TIMES DAILY 300 each 1  . Alcohol Swabs (B-D SINGLE USE SWABS REGULAR) PADS USE TO TEST BLOOD SUGAR 3 TIMES DAILY.   300 each 2  . Alcohol Swabs PADS Use to test blood sugar 3 times daily. Dx: E11.9 300 each 2  . b complex vitamins tablet Take 0.5 tablets by mouth 2 (two) times daily.     . Biotin 5 MG TABS Take 5,000 mcg by mouth at bedtime.     . Blood Glucose Calibration (GLUCOSE CONTROL) SOLN Use to test blood sugar 3 times daily. Dx: E11.9 1 each 2  . Blood Glucose Monitoring Suppl (ACCU-CHEK AVIVA PLUS) w/Device KIT USE AS DIRECTED  TO TEST BLOOD GLUCOSE 1 kit 0  . clopidogrel (PLAVIX) 75 MG  tablet TAKE 1 TABLET (75 MG TOTAL) BY MOUTH DAILY. 90 tablet 3  . Cranberry 500 MG TABS Take 500 mg by mouth daily.    .Marland Kitchengabapentin (NEURONTIN) 100 MG capsule Take 1 capsule (100 mg total) by mouth 2 (two) times daily. 180 capsule 3  . gemfibrozil (LOPID) 600 MG tablet Take 1 tablet (600 mg total) by mouth 2 (two) times daily before a meal. 180 tablet 1  . glucose blood (TRUE METRIX BLOOD GLUCOSE TEST) test strip TEST BLOOD SUGAR THREE TIMES DAILY  300 each 12  . insulin degludec (TRESIBA FLEXTOUCH) 100 UNIT/ML SOPN FlexTouch Pen 60 units subque daily (Patient taking differently: Inject 60 Units into the skin at bedtime. 60 units subque daily) 3 pen 6  . Insulin Pen Needle 32G X 4 MM MISC Used to inject insulin 1x daily. 100 each 4  . Insulin Syringe-Needle U-100 (INSULIN SYRINGE 1CC/30GX5/16") 30G X 5/16" 1 ML MISC Use to inject insulin 1 time per day. 100 each 2  . magnesium oxide (MAG-OX) 400 MG tablet Take 200 mg by mouth daily.    . Menthol, Topical Analgesic, (BIOFREEZE ROLL-ON EX) Apply 1 application topically as needed (joint pain).    . metFORMIN (GLUCOPHAGE) 1000 MG tablet TAKE 1 TABLET TWICE DAILY WITH A MEAL 180 tablet 1  . Multiple Vitamin (MULTIVITAMIN) tablet Take 0.5 tablets by mouth 2 (two) times daily.     . niacin (NIASPAN) 500 MG CR tablet Take 1 tablet (500 mg total) by mouth at bedtime. 30 tablet 3  . Omega-3 Fatty Acids (FISH OIL) 1000 MG CAPS Take 1,000 mg by mouth 2 (two) times a day.     . pantoprazole (PROTONIX) 40 MG tablet TAKE 1 TABLET EVERY DAY 90 tablet 1  . potassium chloride SA (KLOR-CON) 20 MEQ tablet Take 1 tablet (20 mEq total) by mouth daily. 90 tablet 1  . Probiotic Product (PROBIOTIC DAILY PO) Take 1 capsule by mouth daily.    Marland Kitchen VITAMIN D PO Take 1 capsule by mouth daily.     Marland Kitchen zinc gluconate 50 MG tablet Take 50 mg by mouth at bedtime.     No current facility-administered medications on file prior to visit.    Review of Systems:  As per HPI- otherwise  negative. No fever or chills, no chest pain or shortness of breath  Physical Examination: Vitals:   12/18/19 1302  BP: 128/70  Pulse: 78  Temp: (!) 97.2 F (36.2 C)  SpO2: 92%   There were no vitals filed for this visit. There is no height or weight on file to calculate BMI. Ideal Body Weight:    GEN: WDWN, NAD, Non-toxic, A & O x 3, obese, looks well HEENT: Atraumatic, Normocephalic. Neck supple. No masses, No LAD. Ears and Nose: No external deformity. CV: RRR, No M/G/R. No JVD. No thrill. No extra heart sounds. PULM: CTA B, no wheezes, crackles, rhonchi. No retractions. No resp. distress. No accessory muscle use. Status post bilateral mastectomy ABD: S, NT, +BS. No rebound. No HSM.  Her abdomen is soft, but lower abdomen is distended.  She seems to have a ventral hernia which is contributing to abdominal distention EXTR: No c/c/e NEURO Normal gait.  PSYCH: Normally interactive. Conversant. Not depressed or anxious appearing.  Calm demeanor.  Feet appear normal, no significant swelling  Assessment and Plan: Medication monitoring encounter - Plan: Basic metabolic panel  Type 2 diabetes mellitus with complication, without long-term current use of insulin (HCC) - Plan: Hemoglobin A1c  Dyslipidemia - Plan: Lipid panel  Essential hypertension, benign - Plan: Basic metabolic panel  Incontinence of feces, unspecified fecal incontinence type - Plan: Ambulatory referral to Gastroenterology, DG Abd Acute W/Chest  Here today to follow-up on a few issues Diabetes on insulin, await A1c Cholesterol panel pending She has had incontinence of feces since she had surgery for cystocele.  She is doing some physical therapy, but has not yet had a GI consultation.  I will place this referral for her today Moderate medical decision making This visit  occurred during the SARS-CoV-2 public health emergency.  Safety protocols were in place, including screening questions prior to the visit,  additional usage of staff PPE, and extensive cleaning of exam room while observing appropriate contact time as indicated for disinfecting solutions.   Signed Lamar Blinks, MD  Abdominal films are normal A1c is very high, triglycerides extremely high.  We will need to call patient and go over results  Addendum 1/18, called patient to discuss her labs Gemfibrozil Tresiba 60 units daily Metformin 1000 twice daily Niaspan  She has not been able to use any GLP-1 agonists- has tried ozempic, trulity and victoza Cannot take statins -patient reports she is taking her gemfibrozil and Niaspan as directed I discussed a referral to cardiology for lipid evaluation, she declines at this time We will add glipizide to her diabetes regimen Asked her to see me in 3 months to recheck her labs   We will also send her a letter with labs  DG Abd Acute W/Chest  Result Date: 12/18/2019 CLINICAL DATA:  Abdominal distension, fecal incontinence EXAM: DG ABDOMEN ACUTE W/ 1V CHEST COMPARISON:  09/08/2019 FINDINGS: Bowel gas pattern is unremarkable. There is no evidence of free air. Moderate stool burden. The lungs are clear. Heart size is normal. Aortoiliac stenting is noted. IMPRESSION: No acute abnormality. Electronically Signed   By: Macy Mis M.D.   On: 12/18/2019 15:02   Results for orders placed or performed in visit on 12/18/19  Hemoglobin A1c  Result Value Ref Range   Hgb A1c MFr Bld 9.4 (H) 4.6 - 6.5 %  Lipid panel  Result Value Ref Range   Cholesterol 179 0 - 200 mg/dL   Triglycerides (H) 0.0 - 149.0 mg/dL    1071.0 Triglyceride is over 400; calculations on Lipids are invalid.   HDL 22.60 (L) >39.00 mg/dL   Total CHOL/HDL Ratio 8   Basic metabolic panel  Result Value Ref Range   Sodium 134 (L) 135 - 145 mEq/L   Potassium 4.2 3.5 - 5.1 mEq/L   Chloride 98 96 - 112 mEq/L   CO2 26 19 - 32 mEq/L   Glucose, Bld 168 (H) 70 - 99 mg/dL   BUN 19 6 - 23 mg/dL   Creatinine, Ser 0.76 0.40 -  1.20 mg/dL   GFR 74.16 >60.00 mL/min   Calcium 9.8 8.4 - 10.5 mg/dL  LDL cholesterol, direct  Result Value Ref Range   Direct LDL 44.0 mg/dL

## 2019-12-17 NOTE — Patient Instructions (Addendum)
It was good to see you today, I will get in touch with your labs.  Assuming all looks reasonable, let us plan to recheck in 4 months  I would encourage you to get the shingles vaccine at your convenience, and also the COVID-19 vaccine when available  Please go to lab and then to x-ray - I will be in touch with your results asap We will also get you set up to see a gastroenterologist about your intestinal symptoms

## 2019-12-18 ENCOUNTER — Other Ambulatory Visit: Payer: Self-pay

## 2019-12-18 ENCOUNTER — Ambulatory Visit (HOSPITAL_BASED_OUTPATIENT_CLINIC_OR_DEPARTMENT_OTHER)
Admission: RE | Admit: 2019-12-18 | Discharge: 2019-12-18 | Disposition: A | Discharge status: Home / Self Care | Payer: Medicare HMO | Source: Ambulatory Visit | Attending: Family Medicine | Admitting: Family Medicine

## 2019-12-18 ENCOUNTER — Ambulatory Visit (INDEPENDENT_AMBULATORY_CARE_PROVIDER_SITE_OTHER): Payer: Medicare HMO | Admitting: Family Medicine

## 2019-12-18 ENCOUNTER — Encounter: Payer: Self-pay | Admitting: Family Medicine

## 2019-12-18 VITALS — BP 128/70 | HR 78 | Temp 97.2°F

## 2019-12-18 DIAGNOSIS — R159 Full incontinence of feces: Secondary | ICD-10-CM

## 2019-12-18 DIAGNOSIS — E118 Type 2 diabetes mellitus with unspecified complications: Secondary | ICD-10-CM | POA: Diagnosis not present

## 2019-12-18 DIAGNOSIS — I10 Essential (primary) hypertension: Secondary | ICD-10-CM | POA: Diagnosis not present

## 2019-12-18 DIAGNOSIS — Z5181 Encounter for therapeutic drug level monitoring: Secondary | ICD-10-CM

## 2019-12-18 DIAGNOSIS — R140 Abdominal distension (gaseous): Secondary | ICD-10-CM | POA: Diagnosis not present

## 2019-12-18 DIAGNOSIS — E785 Hyperlipidemia, unspecified: Secondary | ICD-10-CM

## 2019-12-18 LAB — BASIC METABOLIC PANEL
BUN: 19 mg/dL (ref 6–23)
CO2: 26 mEq/L (ref 19–32)
Calcium: 9.8 mg/dL (ref 8.4–10.5)
Chloride: 98 mEq/L (ref 96–112)
Creatinine, Ser: 0.76 mg/dL (ref 0.40–1.20)
GFR: 74.16 mL/min (ref >60.00)
Glucose, Bld: 168 mg/dL — ABNORMAL HIGH (ref 70–99)
Potassium: 4.2 mEq/L (ref 3.5–5.1)
Sodium: 134 mEq/L — ABNORMAL LOW (ref 135–145)

## 2019-12-18 LAB — LIPID PANEL
Cholesterol: 179 mg/dL (ref 0–200)
HDL: 22.6 mg/dL — ABNORMAL LOW (ref >39.00)
Total CHOL/HDL Ratio: 8
Triglycerides: 1071 mg/dL — ABNORMAL HIGH (ref 0.0–149.0)

## 2019-12-18 LAB — LDL CHOLESTEROL, DIRECT: Direct LDL: 44 mg/dL

## 2019-12-18 LAB — HEMOGLOBIN A1C: Hgb A1c MFr Bld: 9.4 % — ABNORMAL HIGH (ref 4.6–6.5)

## 2019-12-18 IMAGING — DX DG ABDOMEN ACUTE W/ 1V CHEST
4 series · 4 of 4 positions shown · non-contrast
Comparison: [DATE]

CLINICAL DATA: Abdominal distension, fecal incontinence

EXAM:
DG ABDOMEN ACUTE W/ 1V CHEST

[chest pa]
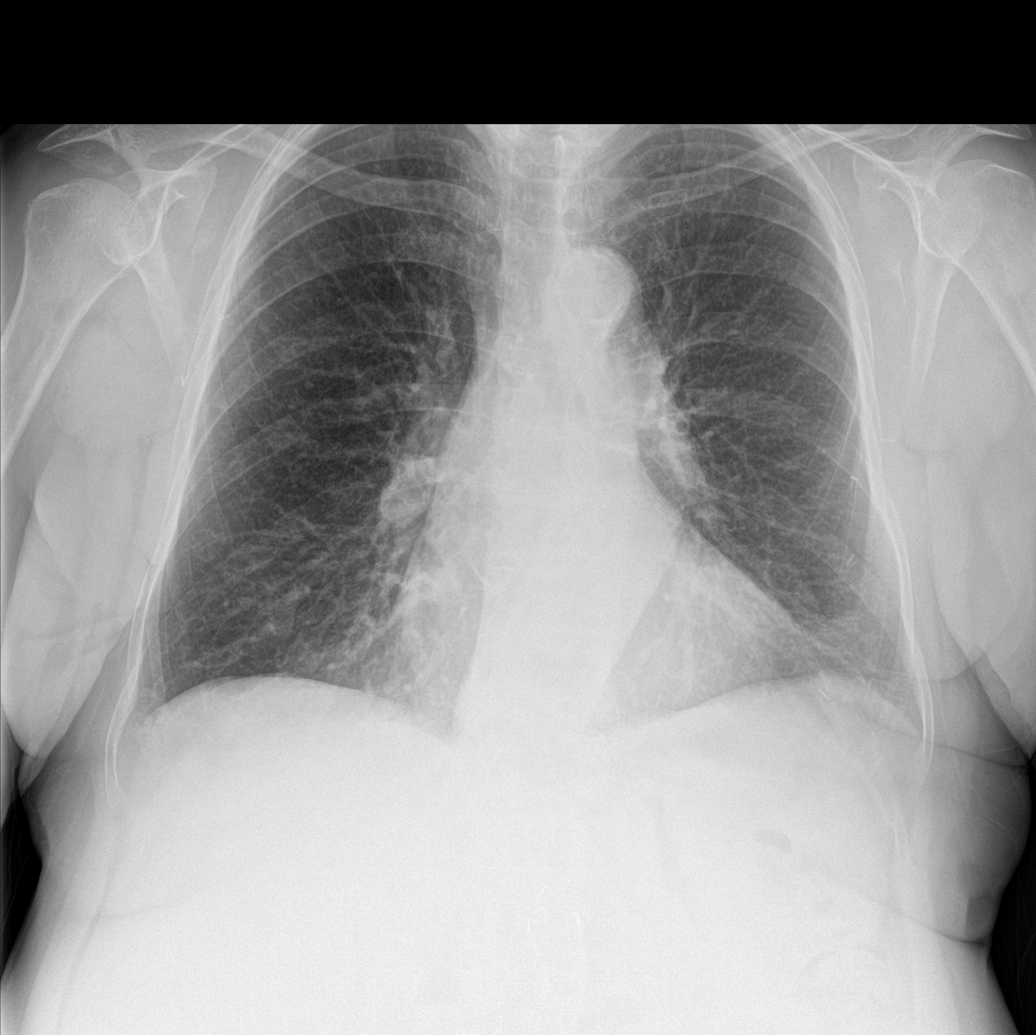

[abdomen erect]
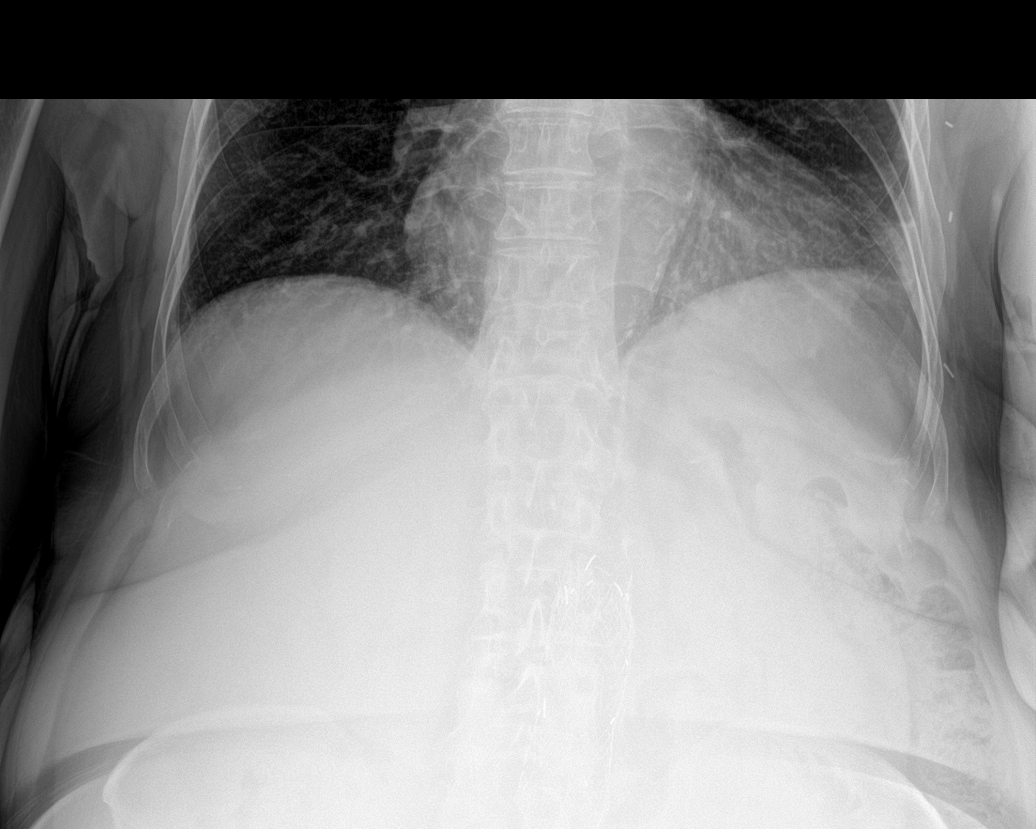

[abdomen supine]
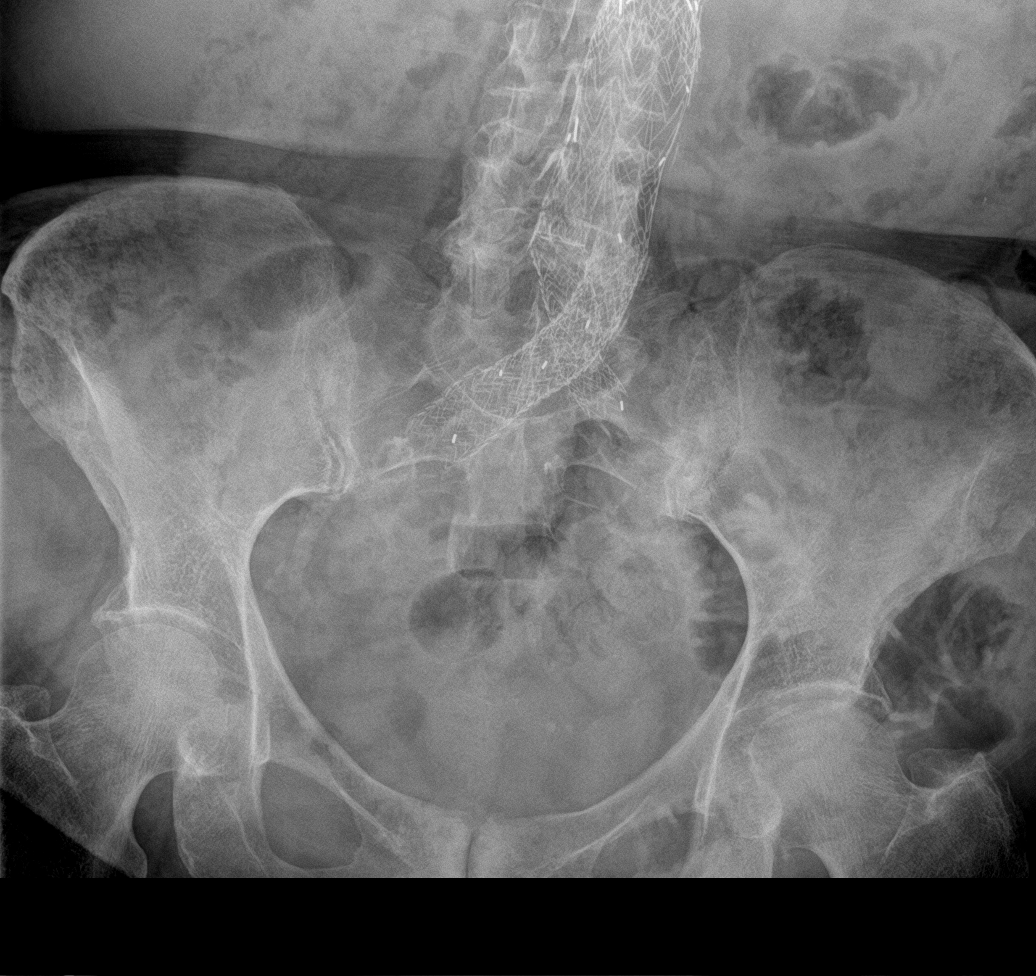

[abdomen kub]
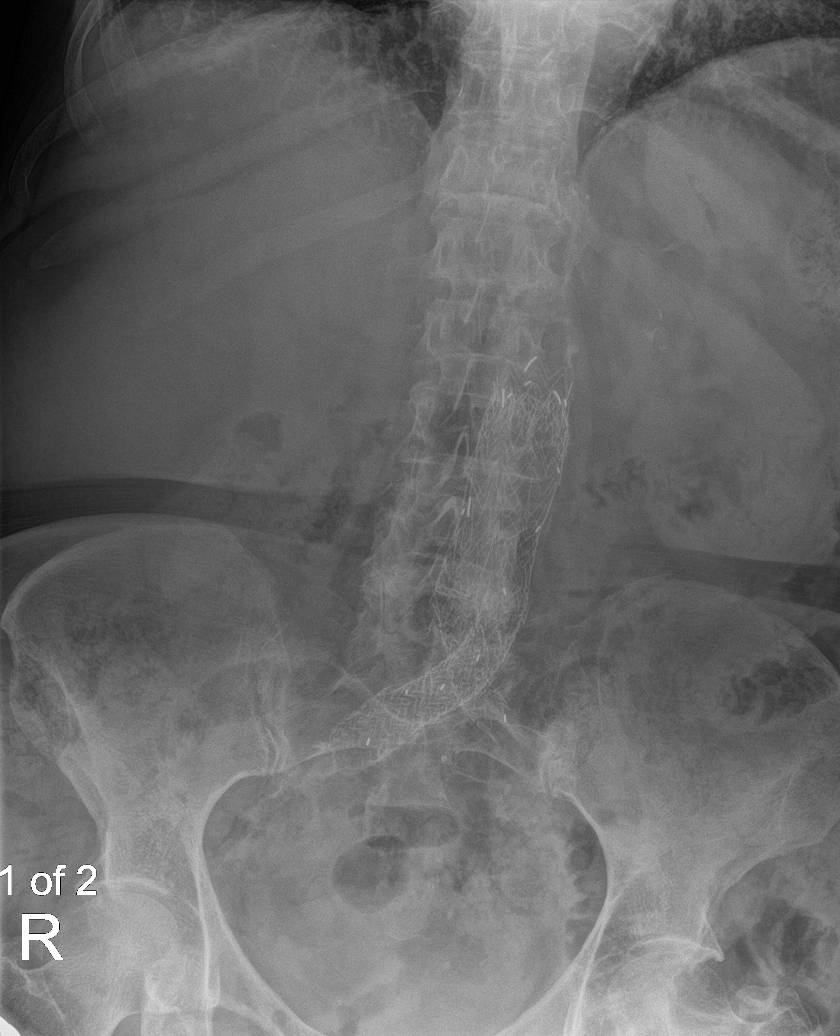

[4 of 4 positions shown; findings below may reference images not displayed]

FINDINGS: Bowel gas pattern is unremarkable. There is no evidence of free air.
Moderate stool burden. The lungs are clear. Heart size is normal.
Aortoiliac stenting is noted.
IMPRESSION: No acute abnormality.

## 2019-12-22 MED ORDER — GLIPIZIDE 5 MG PO TABS: ORAL_TABLET | ORAL | 3 refills | Status: DC

## 2019-12-22 NOTE — Addendum Note (Signed)
Addended by: Lamar Blinks C on: 12/22/2019 12:55 PM   Modules accepted: Orders

## 2019-12-23 DIAGNOSIS — N3020 Other chronic cystitis without hematuria: Secondary | ICD-10-CM | POA: Diagnosis not present

## 2019-12-23 DIAGNOSIS — N8111 Cystocele, midline: Secondary | ICD-10-CM | POA: Diagnosis not present

## 2020-01-13 ENCOUNTER — Encounter: Payer: Self-pay | Admitting: Gastroenterology

## 2020-01-13 ENCOUNTER — Other Ambulatory Visit: Payer: Self-pay

## 2020-01-13 ENCOUNTER — Ambulatory Visit: Payer: Medicare HMO | Admitting: Gastroenterology

## 2020-01-13 VITALS — BP 148/84 | HR 76 | Temp 97.8°F | Ht 64.0 in | Wt 184.0 lb

## 2020-01-13 DIAGNOSIS — R194 Change in bowel habit: Secondary | ICD-10-CM | POA: Diagnosis not present

## 2020-01-13 NOTE — Progress Notes (Signed)
Referring Provider: Darreld Mclean, MD Primary Care Physician:  Darreld Mclean, MD  Reason for Consultation:  Fecal incontinence   IMPRESSION:  Change in bowel habits Moderate stool burden on recent abdominal films Personal history of colon polyps    - tubular adenoma on colonoscopy 2013 in [place] of cystocele with prolapse 05/25/1999  Suspected overflow incontinence with altered anorectal dysfunction following recent surgery. Bowel purgative now. Then start psyllium BID. If no improvement, Sitz [employer] +/- anorectal manometry. Will obtain her surgical records for betting understanding of her recent history.   Colonoscopy for full evaluation recommended, but, she wishes to avoid colonoscopy  PLAN: Bowel purgative with  Start taking sugar-free Metamucil twice daily after completing the bowel purgative Increase exercise, eat more high-fiber foods, and drink plenty of fluids Work to avoid straining with defecation Obtain records from Urologist Consider colonoscopy +/- Sitz Study Follow-up in 2-3 weeks with me or an APP  Please see the "Patient Instructions" section for addition details about the plan.  HPI: Mandy Kim is a 74 y.o. female referred by Dr. Lorelei Pont for fecal incontinence.  The history is obtained through the patient and review of her electronic health record.  She has unevenly controlled diabetes, hyperlipidemia, neuropathy, AAA status post repair 2013, right breast cancer 2002, left breast cancer 2003, status post mastectomy, chemo, tamoxifen. Also history of cervical cancer year 2000 status post hysterectomy.  Repair of cystocele with prolapse 05/25/1999.  Feels like it worked well but is now having more bowel urgency, difficulty controlling defecation, and fecal leakage. Symptoms developed immediately after surgery.   Feels constipation building but will have fairly normal bowel movement.Then she will have several days of diarrhea - up to 8  watery BM daily. No blood. Some mucous.  Never has a sense of of complete evacuation. Often experiences diffuse abdominal cramping with diarrhea.  Has Miralax but stopped using it because it was making things worse. Is using chia and flax seed every morning.   Colonoscopy in [place] with Dr. Paulla Fore 10/29/12 showed diverticulosis and a 66m tubular adenoma.  She is afraid to have another colonoscopy after her surgery.   I have personally reviewed the abdominal films from 12/18/19: Normal bowel gas pattern. Moderate stool burden.  No obvious fecal impaction.  Labs from 08/18/2019 show a normal CBC including a hemoglobin of 13, MCV 87.3, RDW 15.5, platelets 303 Labs from 12/18/2019 show mild hyponatremia with a sodium of 134, hyperglycemia with a serum glucose of 168 and are otherwise normal.  No known family history of colon cancer or polyps. No family history of uterine/endometrial cancer, pancreatic cancer or gastric/stomach cancer.   Past Medical History:  Diagnosis Date  . Anticoagulant long-term use    plavix  . [place]   . [place]  . Chronic cystitis   . Chronic kidney disease   . Diverticulitis   . GERD (gastroesophageal reflux disease)   . Hiatal hernia   . History of basal cell carcinoma excision    face/ nose right side  . History of bilateral breast cancer followed by oncologist until 2008,  per pt no recurrence   right 2002  and left 2003 s/p  mastectomy with completed chemo and tamexifen therapy   . History of cervical cancer 2000   s/p  TAH w/ BSO  . History of diverticulitis of colon   . Hyperlipidemia   . OSA on CPAP   . PAD (peripheral artery  disease) ([place])   . Peripheral neuropathy   . PONV (postoperative nausea and vomiting)   . S/P AAA (abdominal aortic aneurysm) repair 09/15/2011  . Stress incontinence due to pelvic organ prolapse   . Type 2 diabetes mellitus treated with insulin ([place])    followed by pcp  . Wears  glasses     Past Surgical History:  Procedure Laterality Date  . ABDOMINAL HYSTERECTOMY  2000   w/  BSO,  and Bladder tacking abdominally  . CARPAL TUNNEL RELEASE Left 1999  . CATARACT EXTRACTION W/ INTRAOCULAR LENS  IMPLANT, BILATERAL  2015  . ENDOVASCULAR REPAIR/STENT GRAFT  09-15-2011    @[facility]   . INCONTINENCE SURGERY  2007  approx.  Marland Kitchen MASTECTOMY Bilateral right 2002;  left 2003   lymph node dissection done only on right side  . ROBOTIC ASSISTED LAPAROSCOPIC SACROCOLPOPEXY N/A 05/23/2019   Procedure: XI ROBOTIC ASSISTED LAPAROSCOPIC SACROCOLPOPEXY;  Surgeon: Ardis Hughs, MD;  Location: [facility];  Service: Urology;  Laterality: N/A;  . TONSILLECTOMY  child  . TYMPANOPLASTY Right 1970s   "had my ear drum replacement with a plastic one"  . WRIST GANGLION EXCISION Left 1998    Current Outpatient Medications  Medication Sig Dispense Refill  . Accu-Chek Softclix Lancets lancets TEST BLOOD SUGAR THREE TIMES DAILY 300 each 1  . Alcohol Swabs (B-D SINGLE USE SWABS REGULAR) PADS USE TO TEST BLOOD SUGAR 3 TIMES DAILY.   300 each 2  . Alcohol Swabs PADS Use to test blood sugar 3 times daily. Dx: E11.9 300 each 2  . b complex vitamins tablet Take 0.5 tablets by mouth 2 (two) times daily.     . Biotin 5 MG TABS Take 5,000 mcg by mouth at bedtime.     . Blood Glucose Calibration (GLUCOSE CONTROL) SOLN Use to test blood sugar 3 times daily. Dx: E11.9 1 each 2  . Blood Glucose Monitoring Suppl (ACCU-CHEK AVIVA PLUS) w/Device KIT USE AS DIRECTED  TO TEST BLOOD GLUCOSE 1 kit 0  . clopidogrel (PLAVIX) 75 MG tablet TAKE 1 TABLET (75 MG TOTAL) BY MOUTH DAILY. 90 tablet 3  . Cranberry 500 MG TABS Take 500 mg by mouth daily.    Marland Kitchen gabapentin (NEURONTIN) 100 MG capsule Take 1 capsule (100 mg total) by mouth 2 (two) times daily. 180 capsule 3  . gemfibrozil (LOPID) 600 MG tablet Take 1 tablet (600 mg total) by mouth 2 (two) times daily before a meal. 180 tablet 1  . glipiZIDE (GLUCOTROL) 5 MG tablet  Start 1/2 tablet once a day, increase to one tablet after a week 90 tablet 3  . glucose blood (TRUE METRIX BLOOD GLUCOSE TEST) test strip TEST BLOOD SUGAR THREE TIMES DAILY 300 each 12  . insulin degludec (TRESIBA FLEXTOUCH) 100 UNIT/ML SOPN FlexTouch Pen 60 units subque daily (Patient taking differently: Inject 60 Units into the skin at bedtime. 60 units subque daily) 3 pen 6  . Insulin Pen Needle 32G X 4 MM MISC Used to inject insulin 1x daily. 100 each 4  . Insulin Syringe-Needle U-100 (INSULIN SYRINGE 1CC/30GX5/16") 30G X 5/16" 1 ML MISC Use to inject insulin 1 time per day. 100 each 2  . magnesium oxide (MAG-OX) 400 MG tablet Take 200 mg by mouth daily.    . Menthol, Topical Analgesic, (BIOFREEZE ROLL-ON EX) Apply 1 application topically as needed (joint pain).    . metFORMIN (GLUCOPHAGE) 1000 MG tablet TAKE 1 TABLET TWICE DAILY WITH A MEAL 180 tablet 1  . Multiple  Vitamin (MULTIVITAMIN) tablet Take 0.5 tablets by mouth 2 (two) times daily.     . niacin (NIASPAN) 500 MG CR tablet Take 1 tablet (500 mg total) by mouth at bedtime. 30 tablet 3  . Omega-3 Fatty Acids (FISH OIL) 1000 MG CAPS Take 1,000 mg by mouth 2 (two) times a day.     . pantoprazole (PROTONIX) 40 MG tablet TAKE 1 TABLET EVERY DAY 90 tablet 1  . potassium chloride SA (KLOR-CON) 20 MEQ tablet Take 1 tablet (20 mEq total) by mouth daily. 90 tablet 1  . Probiotic Product (PROBIOTIC DAILY PO) Take 1 capsule by mouth daily.    Marland Kitchen VITAMIN D PO Take 1 capsule by mouth daily.     Marland Kitchen zinc gluconate 50 MG tablet Take 50 mg by mouth at bedtime.     No current facility-administered medications for this visit.    Allergies as of 01/13/2020 - Review Complete 01/13/2020  Allergen Reaction Noted  . Aspirin  01/15/2012  . Bee venom Swelling 09/06/2017  . Ibuprofen Swelling 05/28/2017  . Ozempic (0.25 or 0.5 mg-dose) [semaglutide(0.25 or 0.16m-dos)] Diarrhea and Nausea And Vomiting 05/15/2019  . Poison oak extract Itching and Swelling  09/06/2017  . Statins  05/15/2019  . Trulicity [dulaglutide] Nausea And Vomiting and Other (See Comments) 11/20/2016  . Tylenol [acetaminophen] Swelling 05/28/2017  . Victoza [liraglutide] Other (See Comments) 02/17/2019    Family History  Problem Relation Age of Onset  . Cancer Mother   . Pneumonia Mother   . Thyroid disease Mother   . Thyroid disease Sister   . Obesity Daughter   . Hypertension Daughter   . Thyroid disease Son   . Pneumonia Maternal Grandmother   . Heart attack Maternal Grandfather   . Diabetes Neg Hx     Social History   Socioeconomic History  . Marital status: Married    Spouse name: Not on file  . Number of children: Not on file  . Years of education: Not on file  . Highest education level: Not on file  Occupational History  . Not on file  Tobacco Use  . Smoking status: Former Smoker    Years: 30.00    Types: Cigarettes    Quit date: 06/07/1999    Years since quitting: 20.6  . Smokeless tobacco: Never Used  Substance and Sexual Activity  . Alcohol use: Never  . Drug use: Never  . Sexual activity: Yes  Other Topics Concern  . Not on file  Social History Narrative   [place]   Occupation: truck dGeophysicist/field seismologist   Social Determinants of Health   Financial Resource Strain:   . Difficulty of Paying Living Expenses: Not on file  Food Insecurity:   . Worried About RCharity fundraiserin the Last Year: Not on file  . Ran Out of Food in the Last Year: Not on file  Transportation Needs:   . Lack of Transportation (Medical): Not on file  . Lack of Transportation (Non-Medical): Not on file  Physical Activity:   . Days of Exercise per Week: Not on file  . Minutes of Exercise per Session: Not on file  Stress:   . Feeling of Stress : Not on file  Social Connections:   . Frequency of Communication with Friends and Family: Not on file  . Frequency of Social Gatherings with Friends and Family: Not on file  . Attends Religious  Services: Not on file  . Active Member of Clubs or Organizations:  Not on file  . Attends Archivist Meetings: Not on file  . Marital Status: Not on file  Intimate Partner Violence:   . Fear of Current or Ex-Partner: Not on file  . Emotionally Abused: Not on file  . Physically Abused: Not on file  . Sexually Abused: Not on file    Physical Exam: General:   Alert,  well-nourished, pleasant and cooperative in NAD Head:  Normocephalic and atraumatic. Eyes:  Sclera clear, no icterus.   Conjunctiva pink. Ears:  Normal auditory acuity. Nose:  No deformity, discharge,  or lesions. Mouth:  No deformity or lesions.   Neck:  Supple; no masses or thyromegaly. Lungs:  Clear throughout to auscultation.   No wheezes. Heart:  Regular rate and rhythm; no murmurs. Abdomen:  Soft, protuberent, large ventral hernia, multiple ecchymoses on the abdominal wall, nontender, nondistended, normal bowel sounds, no rebound or guarding. No hepatosplenomegaly.   Rectal:  Deferred  Msk:  Symmetrical. No boney deformities LAD: No inguinal or umbilical LAD Extremities:  No clubbing or edema. Neurologic:  Alert and  oriented x4;  grossly nonfocal Skin:  Intact without significant lesions or rashes. Psych:  Alert and cooperative. Normal mood and affect.     Linda Grimmer L. Tarri Glenn, MD, MPH 01/13/2020, 9:46 AM

## 2020-01-13 NOTE — Patient Instructions (Addendum)
I recommend a bowel purgative.  After you complete that, start sugar-free Metamucil twice daily.  We will work to obtain the records from your urologist.  Let's plan to see each other again in 2-3 weeks to make sure that things are getting better.

## 2020-01-14 ENCOUNTER — Encounter: Payer: Self-pay | Admitting: Gastroenterology

## 2020-01-14 DIAGNOSIS — Z961 Presence of intraocular lens: Secondary | ICD-10-CM | POA: Diagnosis not present

## 2020-01-14 DIAGNOSIS — H04123 Dry eye syndrome of bilateral lacrimal glands: Secondary | ICD-10-CM | POA: Diagnosis not present

## 2020-01-14 DIAGNOSIS — E119 Type 2 diabetes mellitus without complications: Secondary | ICD-10-CM | POA: Diagnosis not present

## 2020-01-14 LAB — HM DIABETES EYE EXAM

## 2020-01-21 ENCOUNTER — Other Ambulatory Visit: Payer: Self-pay | Admitting: Family Medicine

## 2020-02-03 ENCOUNTER — Ambulatory Visit (INDEPENDENT_AMBULATORY_CARE_PROVIDER_SITE_OTHER): Payer: Medicare HMO | Admitting: Gastroenterology

## 2020-02-03 ENCOUNTER — Encounter: Payer: Self-pay | Admitting: Gastroenterology

## 2020-02-03 DIAGNOSIS — K5902 Outlet dysfunction constipation: Secondary | ICD-10-CM

## 2020-02-03 DIAGNOSIS — R159 Full incontinence of feces: Secondary | ICD-10-CM

## 2020-02-03 NOTE — Progress Notes (Signed)
TELEHEALTH VISIT  Referring Provider: Darreld Mclean, MD Primary Care Physician:  Darreld Mclean, MD  Tele-visit due to COVID-19 pandemic Patient requested visit virtually, consented to the virtual encounter via audio enabled telemedicine application Contact made at: 15:10 02/03/20 Patient verified by name and date of birth Location of patient: Home Location provider: [facility] Names of persons participating: Me, patient, Tinnie Gens CMA Time spent on telehealth visit: 28 minutes I discussed the limitations of evaluation and management by telemedicine. The patient expressed understanding and agreed to proceed.  Chief complaint:  Fecal incontinence   IMPRESSION:  Change in bowel habits Moderate stool burden on recent abdominal films Personal history of colon polyps    - tubular adenoma on colonoscopy 2013 in [place] of cystocele with prolapse 05/25/1999  Suspected overflow incontinence with altered anorectal dysfunction following recent surgery. Clinically improved after bowel purgative. Feeling better on psyllium BID. No alarm features. If symptoms progress, will proceed withSitz [employer] +/- anorectal manometry.   She remains resistant to colonoscopy.   PLAN: Continue Metamucil BID Repeat bowel purgative with recurrent symptoms Increase exercise, eat more high-fiber foods, and drink plenty of fluids Work to avoid straining with defecation Consider colonoscopy +/- Sitz Study if symptoms return Follow-up in 2-3 months with me or an APP, earlier as needed   HPI: Mandy Kim is a 74 y.o. female under evaluation for fecal incontinence. She was seen in consultation 01/13/20.  The interval history is obtained through the patient and review of her electronic health record.  She has unevenly controlled diabetes on insulin, hyperlipidemia, neuropathy, AAA status post repair 2013, right breast cancer 2002, left breast cancer 2003, status post  mastectomy, chemo, tamoxifen. Also history of cervical cancer year 2000 status post hysterectomy.  Repair of cystocele with prolapse 05/25/1999.  Feels like it worked well but post-operatively she developed more bowel urgency, difficulty controlling defecation, and fecal leakage. Felt constipation building but she would continue to have fairly normal bowel movement.Then she will have several days of diarrhea - up to 8 watery BM daily. No blood. Some mucous.  Never has a sense of of complete evacuation. Often experiences diffuse abdominal cramping with diarrhea.  Abdominal films from 12/18/19 showed a moderate stool burden without obvious fecal impaction.  She had a colonoscopy in 2013 with Dr. Paulla Fore that showed  showed diverticulosis and a 76m tubular adenoma. She is afraid to have another colonoscopy after her surgery.    Has Miralax but stopped using it because it was making things worse. Is using chia and flax seed every morning.  After her consultation, she took a Miralax/Gatorade bowel purge. Now using Metamucil BID. Continues to eat daily chia and flax seed.  She is now having a normal.  Bowel movements are close to being normal. She is having 3 BM daily, frequently in the morning.  She doesn't feel as full or as bloated as before. Overall very satisfied with her response.  Now she wants to lose weight.  Trying intermittent fasting with Dr. FJacqulyn Canebook.     Past Medical History:  Diagnosis Date  . Anticoagulant long-term use    plavix  . [place]   . [place]  . Chronic cystitis   . Chronic kidney disease   . Diverticulitis   . GERD (gastroesophageal reflux disease)   . Hiatal hernia   . History of basal cell carcinoma excision    face/ nose right side  .  History of bilateral breast cancer followed by oncologist until 2008,  per pt no recurrence   right 2002  and left 2003 s/p  mastectomy with completed chemo and tamexifen therapy   . History  of cervical cancer 2000   s/p  TAH w/ BSO  . History of diverticulitis of colon   . Hyperlipidemia   . OSA on CPAP   . PAD (peripheral artery disease) ([place])   . Peripheral neuropathy   . PONV (postoperative nausea and vomiting)   . S/P AAA (abdominal aortic aneurysm) repair 09/15/2011  . Stress incontinence due to pelvic organ prolapse   . Type 2 diabetes mellitus treated with insulin ([place])    followed by pcp  . Wears glasses     Past Surgical History:  Procedure Laterality Date  . ABDOMINAL HYSTERECTOMY  2000   w/  BSO,  and Bladder tacking abdominally  . CARPAL TUNNEL RELEASE Left 1999  . CATARACT EXTRACTION W/ INTRAOCULAR LENS  IMPLANT, BILATERAL  2015  . ENDOVASCULAR REPAIR/STENT GRAFT  09-15-2011    @[facility]   . INCONTINENCE SURGERY  2007  approx.  Marland Kitchen MASTECTOMY Bilateral right 2002;  left 2003   lymph node dissection done only on right side  . ROBOTIC ASSISTED LAPAROSCOPIC SACROCOLPOPEXY N/A 05/23/2019   Procedure: XI ROBOTIC ASSISTED LAPAROSCOPIC SACROCOLPOPEXY;  Surgeon: Ardis Hughs, MD;  Location: [facility];  Service: Urology;  Laterality: N/A;  . TONSILLECTOMY  child  . TYMPANOPLASTY Right 1970s   "had my ear drum replacement with a plastic one"  . WRIST GANGLION EXCISION Left 1998    Current Outpatient Medications  Medication Sig Dispense Refill  . ACCU-CHEK AVIVA PLUS test strip TEST BLOOD SUGAR THREE TIMES DAILY 300 strip 12  . Accu-Chek Softclix Lancets lancets TEST BLOOD SUGAR THREE TIMES DAILY 300 each 1  . Alcohol Swabs (B-D SINGLE USE SWABS REGULAR) PADS USE TO TEST BLOOD SUGAR 3 TIMES DAILY.   300 each 2  . Alcohol Swabs PADS Use to test blood sugar 3 times daily. Dx: E11.9 300 each 2  . b complex vitamins tablet Take 0.5 tablets by mouth 2 (two) times daily.     . Biotin 5 MG TABS Take 5,000 mcg by mouth at bedtime.     . Blood Glucose Calibration (GLUCOSE CONTROL) SOLN Use to test blood sugar 3 times daily. Dx: E11.9 1 each 2  . Blood Glucose Monitoring  Suppl (ACCU-CHEK AVIVA PLUS) w/Device KIT USE AS DIRECTED  TO TEST BLOOD GLUCOSE 1 kit 0  . clopidogrel (PLAVIX) 75 MG tablet TAKE 1 TABLET (75 MG TOTAL) BY MOUTH DAILY. 90 tablet 3  . Cranberry 500 MG TABS Take 500 mg by mouth daily.    Marland Kitchen gabapentin (NEURONTIN) 100 MG capsule Take 1 capsule (100 mg total) by mouth 2 (two) times daily. 180 capsule 3  . gemfibrozil (LOPID) 600 MG tablet Take 1 tablet (600 mg total) by mouth 2 (two) times daily before a meal. 180 tablet 1  . glipiZIDE (GLUCOTROL) 5 MG tablet Start 1/2 tablet once a day, increase to one tablet after a week 90 tablet 3  . insulin degludec (TRESIBA FLEXTOUCH) 100 UNIT/ML SOPN FlexTouch Pen 60 units subque daily (Patient taking differently: Inject 60 Units into the skin at bedtime. 60 units subque daily) 3 pen 6  . Insulin Pen Needle 32G X 4 MM MISC Used to inject insulin 1x daily. 100 each 4  . Insulin Syringe-Needle U-100 (INSULIN SYRINGE 1CC/30GX5/16") 30G X 5/16" 1 ML MISC  Use to inject insulin 1 time per day. 100 each 2  . magnesium oxide (MAG-OX) 400 MG tablet Take 200 mg by mouth daily.    . Menthol, Topical Analgesic, (BIOFREEZE ROLL-ON EX) Apply 1 application topically as needed (joint pain).    . metFORMIN (GLUCOPHAGE) 1000 MG tablet TAKE 1 TABLET TWICE DAILY WITH A MEAL 180 tablet 1  . Multiple Vitamin (MULTIVITAMIN) tablet Take 0.5 tablets by mouth 2 (two) times daily.     . niacin (NIASPAN) 500 MG CR tablet Take 1 tablet (500 mg total) by mouth at bedtime. 30 tablet 3  . Omega-3 Fatty Acids (FISH OIL) 1000 MG CAPS Take 1,000 mg by mouth 2 (two) times a day.     . pantoprazole (PROTONIX) 40 MG tablet TAKE 1 TABLET EVERY DAY 90 tablet 1  . potassium chloride SA (KLOR-CON) 20 MEQ tablet Take 1 tablet (20 mEq total) by mouth daily. 90 tablet 1  . Probiotic Product (PROBIOTIC DAILY PO) Take 1 capsule by mouth daily.    Marland Kitchen VITAMIN D PO Take 1 capsule by mouth daily.     Marland Kitchen zinc gluconate 50 MG tablet Take 50 mg by mouth at bedtime.      No current facility-administered medications for this visit.    Allergies as of 02/03/2020 - Review Complete 01/14/2020  Allergen Reaction Noted  . Aspirin  01/15/2012  . Bee venom Swelling 09/06/2017  . Ibuprofen Swelling 05/28/2017  . Ozempic (0.25 or 0.5 mg-dose) [semaglutide(0.25 or 0.14m-dos)] Diarrhea and Nausea And Vomiting 05/15/2019  . Poison oak extract Itching and Swelling 09/06/2017  . Statins  05/15/2019  . Trulicity [dulaglutide] Nausea And Vomiting and Other (See Comments) 11/20/2016  . Tylenol [acetaminophen] Swelling 05/28/2017  . Victoza [liraglutide] Other (See Comments) 02/17/2019    Family History  Problem Relation Age of Onset  . Cancer Mother   . Pneumonia Mother   . Thyroid disease Mother   . Thyroid disease Sister   . Obesity Daughter   . Hypertension Daughter   . Thyroid disease Son   . Pneumonia Maternal Grandmother   . Heart attack Maternal Grandfather   . Diabetes Neg Hx     Social History   Socioeconomic History  . Marital status: Married    Spouse name: Not on file  . Number of children: 4  . Years of education: Not on file  . Highest education level: Not on file  Occupational History  . Not on file  Tobacco Use  . Smoking status: Former Smoker    Years: 30.00    Types: Cigarettes    Quit date: 06/07/1999    Years since quitting: 20.6  . Smokeless tobacco: Never Used  Substance and Sexual Activity  . Alcohol use: Never  . Drug use: Never  . Sexual activity: Yes    Birth control/protection: Surgical  Other Topics Concern  . Not on file  Social History Narrative   [place] in [place]   Occupation: truck dGeophysicist/field seismologist   Social Determinants of Health   Financial Resource Strain:   . Difficulty of Paying Living Expenses: Not on file  Food Insecurity:   . Worried About RCharity fundraiserin the Last Year: Not on file  . Ran Out of Food in the Last Year: Not on file  Transportation Needs:   . Lack of Transportation  (Medical): Not on file  . Lack of Transportation (Non-Medical): Not on file  Physical Activity:   . Days of Exercise per Week:  Not on file  . Minutes of Exercise per Session: Not on file  Stress:   . Feeling of Stress : Not on file  Social Connections:   . Frequency of Communication with Friends and Family: Not on file  . Frequency of Social Gatherings with Friends and Family: Not on file  . Attends Religious Services: Not on file  . Active Member of Clubs or Organizations: Not on file  . Attends Archivist Meetings: Not on file  . Marital Status: Not on file  Intimate Partner Violence:   . Fear of Current or Ex-Partner: Not on file  . Emotionally Abused: Not on file  . Physically Abused: Not on file  . Sexually Abused: Not on file    Physical Exam: Complete physical exam not performed due to the limits inherent in a telehealth encounter.  Gen: Awake, alert, and oriented, and well communicative. HEENT: EOMI, non-icteric sclera, NCAT, MMM  Neck: Normal movement of head and neck  Pulm: No labored breathing, speaking in full sentences without conversational dyspnea  Derm: No apparent lesions or bruising in visible field  MS: Moves all visible extremities without noticeable abnormality  Psych: Pleasant, cooperative, normal speech, thought processing seemingly intact       Dynasti Kerman L. Tarri Glenn, MD, MPH 02/03/2020, 7:37 AM

## 2020-03-15 NOTE — Progress Notes (Signed)
Nurse connected with patient 03/16/20 at  3:15 PM EDT by a telephone enabled telemedicine application and verified that I am speaking with the correct person using two identifiers. Patient stated full name and DOB. Patient gave permission to continue with virtual visit. Patient's location was at home and Nurse's location was at [place] office.   Subjective:   Mandy Kim is a 74 y.o. female who presents for an Initial Medicare Annual Wellness Visit.  Enjoys her garden and 61 chickens and 1 goat.   Review of Systems    Home Safety/Smoke Alarms: Feels safe in home. Smoke alarms in place.  Lives w/ husband and dog (german sheppard --Mallie Mussel). 1 story. Wears CPAP at night.    Female:     Mammo- hx bilateral mastectomy.       Dexa scan- declines     CCS-10/29/12.     Objective:      Advanced Directives 03/16/2020 05/23/2019 05/20/2019 02/19/2019 10/18/2018 05/28/2017 02/23/2012  Does Patient Have a Medical Advance Directive? No No No No No No Patient does not have advance directive  Would patient like information on creating a medical advance directive? No - Patient declined No - Patient declined - - - No - Patient declined -  Pre-existing out of facility DNR order (yellow form or pink MOST form) - - - - - - No    Current Medications (verified) Outpatient Encounter Medications as of 03/16/2020  Medication Sig  . ACCU-CHEK AVIVA PLUS test strip TEST BLOOD SUGAR THREE TIMES DAILY  . Accu-Chek Softclix Lancets lancets TEST BLOOD SUGAR THREE TIMES DAILY  . Alcohol Swabs (B-D SINGLE USE SWABS REGULAR) PADS USE TO TEST BLOOD SUGAR 3 TIMES DAILY.    Marland Kitchen Alcohol Swabs PADS Use to test blood sugar 3 times daily. Dx: E11.9  . b complex vitamins tablet Take 0.5 tablets by mouth 2 (two) times daily.   . Biotin 5 MG TABS Take 5,000 mcg by mouth at bedtime.   . Blood Glucose Calibration (GLUCOSE CONTROL) SOLN Use to test blood sugar 3 times daily. Dx: E11.9  . Blood Glucose Monitoring Suppl  (ACCU-CHEK AVIVA PLUS) w/Device KIT USE AS DIRECTED  TO TEST BLOOD GLUCOSE  . clopidogrel (PLAVIX) 75 MG tablet TAKE 1 TABLET (75 MG TOTAL) BY MOUTH DAILY.  Marland Kitchen Cranberry 500 MG TABS Take 500 mg by mouth daily.  Marland Kitchen gabapentin (NEURONTIN) 100 MG capsule Take 1 capsule (100 mg total) by mouth 2 (two) times daily.  Marland Kitchen gemfibrozil (LOPID) 600 MG tablet Take 1 tablet (600 mg total) by mouth 2 (two) times daily before a meal.  . glipiZIDE (GLUCOTROL) 5 MG tablet Start 1/2 tablet once a day, increase to one tablet after a week  . insulin degludec (TRESIBA FLEXTOUCH) 100 UNIT/ML SOPN FlexTouch Pen 60 units subque daily (Patient taking differently: Inject 60 Units into the skin at bedtime. 60 units subque daily)  . Insulin Pen Needle 32G X 4 MM MISC Used to inject insulin 1x daily.  . Insulin Syringe-Needle U-100 (INSULIN SYRINGE 1CC/30GX5/16") 30G X 5/16" 1 ML MISC Use to inject insulin 1 time per day.  . magnesium oxide (MAG-OX) 400 MG tablet Take 200 mg by mouth daily.  . Menthol, Topical Analgesic, (BIOFREEZE ROLL-ON EX) Apply 1 application topically as needed (joint pain).  . metFORMIN (GLUCOPHAGE) 1000 MG tablet TAKE 1 TABLET TWICE DAILY WITH A MEAL  . Multiple Vitamin (MULTIVITAMIN) tablet Take 0.5 tablets by mouth 2 (two) times daily.   . niacin (NIASPAN) 500  MG CR tablet Take 1 tablet (500 mg total) by mouth at bedtime.  . Omega-3 Fatty Acids (FISH OIL) 1000 MG CAPS Take 1,000 mg by mouth 2 (two) times a day.   . pantoprazole (PROTONIX) 40 MG tablet TAKE 1 TABLET EVERY DAY  . potassium chloride SA (KLOR-CON) 20 MEQ tablet Take 1 tablet (20 mEq total) by mouth daily.  . Probiotic Product (PROBIOTIC DAILY PO) Take 1 capsule by mouth daily.  Marland Kitchen VITAMIN D PO Take 1 capsule by mouth daily.   Marland Kitchen zinc gluconate 50 MG tablet Take 50 mg by mouth at bedtime.   No facility-administered encounter medications on file as of 03/16/2020.    Allergies (verified) Aspirin, Bee venom, Ibuprofen, Ozempic (0.25 or 0.5  mg-dose) [semaglutide(0.25 or 0.27m-dos)], Poison oak extract, Statins, Trulicity [dulaglutide], Tylenol [acetaminophen], and Victoza [liraglutide]   History: Past Medical History:  Diagnosis Date  . Anticoagulant long-term use    plavix  . Cancer ([facility]   . CHF (congestive heart failure) ([facility] 2000  . Chronic cystitis   . Chronic kidney disease   . Diverticulitis   . GERD (gastroesophageal reflux disease)   . Hiatal hernia   . History of basal cell carcinoma excision    face/ nose right side  . History of bilateral breast cancer followed by oncologist until 2008,  per pt no recurrence   right 2002  and left 2003 s/p  mastectomy with completed chemo and tamexifen therapy   . History of cervical cancer 2000   s/p  TAH w/ BSO  . History of diverticulitis of colon   . Hyperlipidemia   . OSA on CPAP   . PAD (peripheral artery disease) ([facility]   . Peripheral neuropathy   . PONV (postoperative nausea and vomiting)   . S/P AAA (abdominal aortic aneurysm) repair 09/15/2011  . Stress incontinence due to pelvic organ prolapse   . Type 2 diabetes mellitus treated with insulin ([facility]    followed by pcp  . Wears glasses    Past Surgical History:  Procedure Laterality Date  . ABDOMINAL HYSTERECTOMY  2000   w/  BSO,  and Bladder tacking abdominally  . CARPAL TUNNEL RELEASE Left 1999  . CATARACT EXTRACTION W/ INTRAOCULAR LENS  IMPLANT, BILATERAL  2015  . ENDOVASCULAR REPAIR/STENT GRAFT  09-15-2011    @[email or web address]   . INCONTINENCE SURGERY  2007  approx.  .Marland KitchenMASTECTOMY Bilateral right 2002;  left 2003   lymph node dissection done only on right side  . ROBOTIC ASSISTED LAPAROSCOPIC SACROCOLPOPEXY N/A 05/23/2019   Procedure: XI ROBOTIC ASSISTED LAPAROSCOPIC SACROCOLPOPEXY;  Surgeon: HArdis Hughs MD;  Location: [facility];  Service: Urology;  Laterality: N/A;  . TONSILLECTOMY  child  . TYMPANOPLASTY Right 1970s   "had my ear drum replacement with a plastic one"  . WRIST GANGLION EXCISION Left 1998    Family History  Problem Relation Age of Onset  . Cancer Mother   . Pneumonia Mother   . Thyroid disease Mother   . Thyroid disease Sister   . Obesity Daughter   . Hypertension Daughter   . Thyroid disease Son   . Pneumonia Maternal Grandmother   . Heart attack Maternal Grandfather   . Diabetes Neg Hx    Social History   Socioeconomic History  . Marital status: Married    Spouse name: Not on file  . Number of children: 4  . Years of education: Not on file  . Highest education level: Not on file  Occupational History  .  Not on file  Tobacco Use  . Smoking status: Former Smoker    Years: 30.00    Types: Cigarettes    Quit date: 06/07/1999    Years since quitting: 20.7  . Smokeless tobacco: Never Used  Substance and Sexual Activity  . Alcohol use: Never  . Drug use: Never  . Sexual activity: Yes    Birth control/protection: Surgical  Other Topics Concern  . Not on file  Social History Narrative   Lives with husband in [place].   Occupation: truck Geophysicist/field seismologist.   Social Determinants of Health   Financial Resource Strain:   . Difficulty of Paying Living Expenses:   Food Insecurity:   . Worried About Charity fundraiser in the Last Year:   . Arboriculturist in the Last Year:   Transportation Needs:   . Film/video editor (Medical):   Marland Kitchen Lack of Transportation (Non-Medical):   Physical Activity:   . Days of Exercise per Week:   . Minutes of Exercise per Session:   Stress:   . Feeling of Stress :   Social Connections:   . Frequency of Communication with Friends and Family:   . Frequency of Social Gatherings with Friends and Family:   . Attends Religious Services:   . Active Member of Clubs or Organizations:   . Attends Archivist Meetings:   Marland Kitchen Marital Status:     Tobacco Counseling Counseling given: Not Answered   Clinical Intake: Pain : No/denies pain    Activities of Daily Living In your present state of health, do you have any difficulty  performing the following activities: 03/16/2020 05/23/2019  Hearing? N Y  [place]? N N  Difficulty concentrating or making decisions? N N  Walking or climbing stairs? N N  Dressing or bathing? N N  Doing errands, shopping? Y N  Preparing Food and eating ? N -  Using the Toilet? N -  In the past six months, have you accidently leaked urine? N -  Do you have problems with loss of bowel control? N -  Managing your Medications? N -  Managing your Finances? N -  Housekeeping or managing your Housekeeping? N -  Some recent data might be hidden     Immunizations and Health Maintenance Immunization History  Administered Date(s) Administered  . Pneumococcal Conjugate-13 07/27/2014  . Pneumococcal Polysaccharide-23 02/25/2012  . Td 03/08/2017   Health Maintenance Due  Topic Date Due  . URINE MICROALBUMIN  04/22/2020    Patient Care Team: Copland, Gay Filler, MD as PCP - General (Family Medicine)  Indicate any recent Medical Services you may have received from other than [facility] providers in the past year (date may be approximate).     Assessment:   This is a routine wellness examination for Mandy Kim. Physical assessment deferred to PCP.  Hearing/Vision screen Unable to assess. This visit is enabled though telemedicine due to Covid 19.   Dietary issues and exercise activities discussed: Current Exercise Habits: The patient does not participate in regular exercise at present, Exercise limited by: None identified Diet (meal preparation, eat out, water intake, caffeinated beverages, dairy products, fruits and vegetables): 24 hr recall. Breakfast: skipped Lunch: double cheeseburger. No bread. Dinner:    Soup and walnuts.  Goals    . Increase physical activity      Depression Screen PHQ 2/9 Scores 03/16/2020 09/07/2016 08/18/2015 03/15/2015 07/27/2014  PHQ - 2 Score 0 0 0 0 0  Fall Risk Fall Risk  03/16/2020 09/07/2016 03/15/2015 07/27/2014  Falls in the past year? 0 No No No   Number falls in past yr: 0 - - -  Injury with Fall? 0 - - -  Follow up Education provided;Falls prevention discussed - - -   Cognitive Function:     Ad8 score reviewed for issues:  Issues making decisions:no  Less interest in hobbies / activities:no  Repeats questions, stories (family complaining):no  Trouble using ordinary gadgets (microwave, computer, phone):no  Forgets the month or year: no  Mismanaging finances: no  Remembering appts:no  Daily problems with thinking and/or memory:no Ad8 score is=0    Screening Tests Health Maintenance  Topic Date Due  . URINE MICROALBUMIN  04/22/2020  . HEMOGLOBIN A1C  06/16/2020  . INFLUENZA VACCINE  07/04/2020  . FOOT EXAM  12/17/2020  . OPHTHALMOLOGY EXAM  01/13/2021  . COLONOSCOPY  10/29/2022  . TETANUS/TDAP  03/09/2027  . DEXA SCAN  Completed  . Hepatitis C Screening  Completed  . PNA vac Low Risk Adult  Completed    Plan:    Please schedule your next medicare wellness visit with me in 1 yr.  Continue to eat heart healthy diet (full of fruits, vegetables, whole grains, lean protein, water--limit salt, fat, and sugar intake) and increase physical activity as tolerated.  Continue doing brain stimulating activities (puzzles, reading, adult coloring books, staying active) to keep memory sharp.    I have personally reviewed and noted the following in the patient's chart:   . Medical and social history . Use of alcohol, tobacco or illicit drugs  . Current medications and supplements . Functional ability and status . Nutritional status . Physical activity . Advanced directives . List of other physicians . Hospitalizations, surgeries, and ER visits in previous 12 months . Vitals . Screenings to include cognitive, depression, and falls . Referrals and appointments  In addition, I have reviewed and discussed with patient certain preventive protocols, quality metrics, and best practice recommendations. A written  personalized care plan for preventive services as well as general preventive health recommendations were provided to patient.     Shela Nevin, [place]   03/16/2020

## 2020-03-16 ENCOUNTER — Other Ambulatory Visit: Payer: Self-pay

## 2020-03-16 ENCOUNTER — Encounter: Payer: Self-pay | Admitting: *Deleted

## 2020-03-16 ENCOUNTER — Ambulatory Visit (INDEPENDENT_AMBULATORY_CARE_PROVIDER_SITE_OTHER): Payer: Medicare HMO | Admitting: *Deleted

## 2020-03-16 DIAGNOSIS — Z0000 Encounter for general adult medical examination without abnormal findings: Secondary | ICD-10-CM | POA: Diagnosis not present

## 2020-03-16 NOTE — Patient Instructions (Signed)
Please schedule your next medicare wellness visit with me in 1 yr.  Continue to eat heart healthy diet (full of fruits, vegetables, whole grains, lean protein, water--limit salt, fat, and sugar intake) and increase physical activity as tolerated.  Continue doing brain stimulating activities (puzzles, reading, adult coloring books, staying active) to keep memory sharp.    Mandy Kim , Thank you for taking time to come for your Medicare Wellness Visit. I appreciate your ongoing commitment to your health goals. Please review the following plan we discussed and let me know if I can assist you in the future.   These are the goals we discussed: Goals    . Increase physical activity       This is a list of the screening recommended for you and due dates:  Health Maintenance  Topic Date Due  . Urine Protein Check  04/22/2020  . Hemoglobin A1C  06/16/2020  . Flu Shot  07/04/2020  . Complete foot exam   12/17/2020  . Eye exam for diabetics  01/13/2021  . Colon Cancer Screening  10/29/2022  . Tetanus Vaccine  03/09/2027  . DEXA scan (bone density measurement)  Completed  .  Hepatitis C: One time screening is recommended by Center for Disease Control  (CDC) for  adults born from [birth year] through 1967.   Completed  . Pneumonia vaccines  Completed    Preventive Care 25 Years and Older, Female Preventive care refers to lifestyle choices and visits with your health care provider that can promote health and wellness. This includes:  A yearly physical exam. This is also called an annual well check.  Regular dental and eye exams.  Immunizations.  Screening for certain conditions.  Healthy lifestyle choices, such as diet and exercise. What can I expect for my preventive care visit? Physical exam Your health care provider will check:  Height and weight. These may be used to calculate body mass index (BMI), which is a measurement that tells if you are at a healthy weight.  Heart rate and  blood pressure.  Your skin for abnormal spots. Counseling Your health care provider may ask you questions about:  Alcohol, tobacco, and drug use.  Emotional well-being.  Home and relationship well-being.  Sexual activity.  Eating habits.  History of falls.  Memory and ability to understand (cognition).  Work and work Statistician.  Pregnancy and menstrual history. What immunizations do I need?  Influenza (flu) vaccine  This is recommended every year. Tetanus, diphtheria, and pertussis (Tdap) vaccine  You may need a Td booster every 10 years. Varicella (chickenpox) vaccine  You may need this vaccine if you have not already been vaccinated. Zoster (shingles) vaccine  You may need this after age 91. Pneumococcal conjugate (PCV13) vaccine  One dose is recommended after age 80. Pneumococcal polysaccharide (PPSV23) vaccine  One dose is recommended after age 73. Measles, mumps, and rubella (MMR) vaccine  You may need at least one dose of MMR if you were born in 1957 or later. You may also need a second dose. Meningococcal conjugate (MenACWY) vaccine  You may need this if you have certain conditions. Hepatitis A vaccine  You may need this if you have certain conditions or if you travel or work in places where you may be exposed to hepatitis A. Hepatitis B vaccine  You may need this if you have certain conditions or if you travel or work in places where you may be exposed to hepatitis B. Haemophilus influenzae type b (Hib) vaccine  You may need this if you have certain conditions. You may receive vaccines as individual doses or as more than one vaccine together in one shot (combination vaccines). Talk with your health care provider about the risks and benefits of combination vaccines. What tests do I need? Blood tests  Lipid and cholesterol levels. These may be checked every 5 years, or more frequently depending on your overall health.  Hepatitis C  test.  Hepatitis B test. Screening  Lung cancer screening. You may have this screening every year starting at age 108 if you have a 30-pack-year history of smoking and currently smoke or have quit within the past 15 years.  Colorectal cancer screening. All adults should have this screening starting at age 21 and continuing until age 61. Your health care provider may recommend screening at age 2 if you are at increased risk. You will have tests every 1-10 years, depending on your results and the type of screening test.  Diabetes screening. This is done by checking your blood sugar (glucose) after you have not eaten for a while (fasting). You may have this done every 1-3 years.  Mammogram. This may be done every 1-2 years. Talk with your health care provider about how often you should have regular mammograms.  BRCA-related cancer screening. This may be done if you have a family history of breast, ovarian, tubal, or peritoneal cancers. Other tests  Sexually transmitted disease (STD) testing.  Bone density scan. This is done to screen for osteoporosis. You may have this done starting at age 21. Follow these instructions at home: Eating and drinking  Eat a diet that includes fresh fruits and vegetables, whole grains, lean protein, and low-fat dairy products. Limit your intake of foods with high amounts of sugar, saturated fats, and salt.  Take vitamin and mineral supplements as recommended by your health care provider.  Do not drink alcohol if your health care provider tells you not to drink.  If you drink alcohol: ? Limit how much you have to 0-1 drink a day. ? Be aware of how much alcohol is in your drink. In the [place] one drink equals one 12 oz bottle of beer (355 mL), one 5 oz glass of wine (148 mL), or one 1 oz glass of hard liquor (44 mL). Lifestyle  Take daily care of your teeth and gums.  Stay active. Exercise for at least 30 minutes on 5 or more days each week.  Do not use  any products that contain nicotine or tobacco, such as cigarettes, e-cigarettes, and chewing tobacco. If you need help quitting, ask your health care provider.  If you are sexually active, practice safe sex. Use a condom or other form of protection in order to prevent STIs (sexually transmitted infections).  Talk with your health care provider about taking a low-dose aspirin or statin. What's next?  Go to your health care provider once a year for a well check visit.  Ask your health care provider how often you should have your eyes and teeth checked.  Stay up to date on all vaccines. This information is not intended to replace advice given to you by your health care provider. Make sure you discuss any questions you have with your health care provider. Document Revised: 11/14/2018 Document Reviewed: 11/14/2018 Elsevier Patient Education  2020 [employer].

## 2020-03-19 ENCOUNTER — Other Ambulatory Visit: Payer: Self-pay | Admitting: Family Medicine

## 2020-04-07 ENCOUNTER — Other Ambulatory Visit: Payer: Self-pay | Admitting: Family Medicine

## 2020-04-07 DIAGNOSIS — E785 Hyperlipidemia, unspecified: Secondary | ICD-10-CM

## 2020-04-13 NOTE — Patient Instructions (Addendum)
It was a pleasure seeing him today, I will be in touch with your labs soon as possible  I would encourage you to have your COVID-19 series as soon as possible, if not done yet.  Also your shingles series   Please gradually work on getting more exercise by walking on your treadmill at home. Build up gradually to a goal of 30 minutes most days of the week.   I will set you up with a dermatologist to look at the spots on your face asap - will refer you to [facility] in [place]

## 2020-04-13 NOTE — Progress Notes (Addendum)
[facility] [address], [place], [place] [address] [phone]  Date:  04/14/2020   Name:  Mandy Kim   DOB:  Jan 18, 1946   MRN:  [id number]  PCP:  Darreld Mclean, MD    Chief Complaint: Follow-up   History of Present Illness:  SHILYNN Kim is a 74 y.o. very pleasant female patient who presents with the following:  [name] is here today for routine follow-up visit History of poorly controlled diabetes, fatty liver, AAA status post repair 2013, hyperlipidemia, cervical cancer status post hysterectomy year 2000, bilateral breast cancer status post double mastectomy  Last seen by myself in January; at that time she had been seeing urology to follow-up on cystocele, urinary and fecal incontinence The urinary incontinence is much better, and she is seeing GI for her bowels- Dr Tarri Glenn She has some improvement but not great -she still has to be very careful about diarrhea, this limits her somewhat as far as going out. she is not able to walk at work for exercise as she used to.  She is not getting as much exercise as previous, she does have a treadmill she could use Since she stopped walking (previously she was walking about 5 miles a day) she notes that she gets out of breath more easily Of note she had a normal ETT in February 2020  Most recent labs in January showed dyslipidemia with triglycerides over thousand, A1c of 9.4% I added glipizide to her regimen at that time- tolerating well.  She has not been able to tolerate statins or GLP-1 agonists She notes her glucose is average 200 Needs microalbumin Due for A1c COVID-19 vaccine Shingles vaccine  Quit smoking about 20 years ago CT abdomen pelvis 2018 showed stable AAA repair Patient Active Problem List   Diagnosis Date Noted  . Prolapse of female pelvic organs 05/24/2019  . Cystocele with prolapse 05/23/2019  . Hypoxia   . Sepsis ([place]) 05/28/2017  .  Elevated LFTs 05/28/2017  . Acute lower UTI 05/28/2017  . Dehydration 05/28/2017  . Peripheral neuropathy 07/27/2014  . AAA (abdominal aortic aneurysm) ([place]) 02/12/2012  . Fatty liver 02/12/2012  . Breast cancer ([place]) 02/12/2012  . Cervical cancer ([place]) 02/12/2012  . Diabetes mellitus ([place]) 02/12/2012  . Hyperlipidemia 02/12/2012    Past Medical History:  Diagnosis Date  . Anticoagulant long-term use    plavix  . Cancer ([place])   . CHF (congestive heart failure) ([place]) 2000  . Chronic cystitis   . Chronic kidney disease   . Diverticulitis   . GERD (gastroesophageal reflux disease)   . Hiatal hernia   . History of basal cell carcinoma excision    face/ nose right side  . History of bilateral breast cancer followed by oncologist until 2008,  per pt no recurrence   right 2002  and left 2003 s/p  mastectomy with completed chemo and tamexifen therapy   . History of cervical cancer 2000   s/p  TAH w/ BSO  . History of diverticulitis of colon   . Hyperlipidemia   . OSA on CPAP   . PAD (peripheral artery disease) ([place])   . Peripheral neuropathy   . PONV (postoperative nausea and vomiting)   . S/P AAA (abdominal aortic aneurysm) repair 09/15/2011  . Stress incontinence due to pelvic organ prolapse   . Type 2 diabetes mellitus treated with insulin ([place])    followed by pcp  . Wears  glasses     Past Surgical History:  Procedure Laterality Date  . ABDOMINAL HYSTERECTOMY  2000   w/  BSO,  and Bladder tacking abdominally  . CARPAL TUNNEL RELEASE Left 1999  . CATARACT EXTRACTION W/ INTRAOCULAR LENS  IMPLANT, BILATERAL  2015  . ENDOVASCULAR REPAIR/STENT GRAFT  09-15-2011    @[facility]   . INCONTINENCE SURGERY  2007  approx.  Marland Kitchen MASTECTOMY Bilateral right 2002;  left 2003   lymph node dissection done only on right side  . ROBOTIC ASSISTED LAPAROSCOPIC SACROCOLPOPEXY N/A 05/23/2019   Procedure: XI ROBOTIC ASSISTED LAPAROSCOPIC SACROCOLPOPEXY;  Surgeon: Ardis Hughs, MD;  Location: [facility];  Service: Urology;  Laterality: N/A;  . TONSILLECTOMY  child  . TYMPANOPLASTY Right 1970s   "had my ear drum replacement with a plastic one"  . WRIST GANGLION EXCISION Left 1998    Social History   Tobacco Use  . Smoking status: Former Smoker    Years: 30.00    Types: Cigarettes    Quit date: 06/07/1999    Years since quitting: 20.8  . Smokeless tobacco: Never Used  Substance Use Topics  . Alcohol use: Never  . Drug use: Never    Family History  Problem Relation Age of Onset  . Cancer Mother   . Pneumonia Mother   . Thyroid disease Mother   . Thyroid disease Sister   . Obesity Daughter   . Hypertension Daughter   . Thyroid disease Son   . Pneumonia Maternal Grandmother   . Heart attack Maternal Grandfather   . Diabetes Neg Hx     Allergies  Allergen Reactions  . Aspirin     Per pt any pain medications - causes fluid build up   . Bee Venom Swelling  . Ibuprofen Swelling  . Ozempic (0.25 Or 0.5 Mg-Dose) [Semaglutide(0.25 Or 0.27m-Dos)] Diarrhea and Nausea And Vomiting    Flatulence  . Poison Oak Extract Itching and Swelling  . Statins     "skin feels like bugs under the skin"  . Trulicity [Dulaglutide] Nausea And Vomiting and Other (See Comments)    Flatulance, "deathly sick"  . Tylenol [Acetaminophen] Swelling  . Victoza [Liraglutide] Other (See Comments)    Stomach upset issues     Medication list has been reviewed and updated.  Current Outpatient Medications on File Prior to Visit  Medication Sig Dispense Refill  . ACCU-CHEK AVIVA PLUS test strip TEST BLOOD SUGAR THREE TIMES DAILY 300 strip 12  . Accu-Chek Softclix Lancets lancets TEST BLOOD SUGAR THREE TIMES DAILY 300 each 1  . Alcohol Swabs (B-D SINGLE USE SWABS REGULAR) PADS USE TO TEST BLOOD SUGAR 3 TIMES DAILY.   300 each 2  . Alcohol Swabs PADS Use to test blood sugar 3 times daily. Dx: E11.9 300 each 2  . b complex vitamins tablet Take 0.5 tablets by mouth 2 (two) times daily.     . Biotin 5 MG  TABS Take 5,000 mcg by mouth at bedtime.     . Blood Glucose Calibration (GLUCOSE CONTROL) SOLN Use to test blood sugar 3 times daily. Dx: E11.9 1 each 2  . Blood Glucose Monitoring Suppl (ACCU-CHEK AVIVA PLUS) w/Device KIT USE AS DIRECTED  TO TEST BLOOD GLUCOSE 1 kit 0  . clopidogrel (PLAVIX) 75 MG tablet TAKE 1 TABLET (75 MG TOTAL) BY MOUTH DAILY. 90 tablet 3  . Cranberry 500 MG TABS Take 500 mg by mouth daily.    .Marland Kitchengabapentin (NEURONTIN) 100 MG capsule Take 1 capsule (  100 mg total) by mouth 2 (two) times daily. 180 capsule 3  . gemfibrozil (LOPID) 600 MG tablet TAKE 1 TABLET (600 MG TOTAL) BY MOUTH 2 (TWO) TIMES DAILY BEFORE A MEAL. 180 tablet 1  . glipiZIDE (GLUCOTROL) 5 MG tablet Start 1/2 tablet once a day, increase to one tablet after a week 90 tablet 3  . insulin degludec (TRESIBA FLEXTOUCH) 100 UNIT/ML SOPN FlexTouch Pen 60 units subque daily (Patient taking differently: Inject 60 Units into the skin at bedtime. 60 units subque daily) 3 pen 6  . Insulin Pen Needle 32G X 4 MM MISC Used to inject insulin 1x daily. 100 each 4  . Insulin Syringe-Needle U-100 (INSULIN SYRINGE 1CC/30GX5/16") 30G X 5/16" 1 ML MISC Use to inject insulin 1 time per day. 100 each 2  . magnesium oxide (MAG-OX) 400 MG tablet Take 200 mg by mouth daily.    . Menthol, Topical Analgesic, (BIOFREEZE ROLL-ON EX) Apply 1 application topically as needed (joint pain).    . metFORMIN (GLUCOPHAGE) 1000 MG tablet TAKE 1 TABLET TWICE DAILY WITH A MEAL 180 tablet 1  . Multiple Vitamin (MULTIVITAMIN) tablet Take 0.5 tablets by mouth 2 (two) times daily.     . niacin (NIASPAN) 500 MG CR tablet Take 1 tablet (500 mg total) by mouth at bedtime. 30 tablet 3  . Omega-3 Fatty Acids (FISH OIL) 1000 MG CAPS Take 1,000 mg by mouth 2 (two) times a day.     . pantoprazole (PROTONIX) 40 MG tablet TAKE 1 TABLET EVERY DAY 90 tablet 1  . potassium chloride SA (KLOR-CON) 20 MEQ tablet Take 1 tablet (20 mEq total) by mouth daily. 90 tablet 1  .  Probiotic Product (PROBIOTIC DAILY PO) Take 1 capsule by mouth daily.    Marland Kitchen VITAMIN D PO Take 1 capsule by mouth daily.     Marland Kitchen zinc gluconate 50 MG tablet Take 50 mg by mouth at bedtime.     No current facility-administered medications on file prior to visit.    Review of Systems:  As per HPI- otherwise negative.   Physical Examination: Vitals:   04/14/20 1247  BP: 132/72  Pulse: 73  Temp: (!) 96 F (35.6 C)  SpO2: 93%   Vitals:   04/14/20 1247  Weight: 180 lb 8 oz (81.9 kg)  Height: 5' 4"  (1.626 m)   Body mass index is 30.98 kg/m. Ideal Body Weight: Weight in (lb) to have BMI = 25: 145.3  GEN: no acute distress. Looks well, her normal self HEENT: Atraumatic, Normocephalic.  Ears and Nose: No external deformity. CV: RRR, No M/G/R. No JVD. No thrill. No extra heart sounds. PULM: CTA B, no wheezes, crackles, rhonchi. No retractions. No resp. distress. No accessory muscle use. ABD: S, NT, ND, +BS. No rebound. No HSM. EXTR: No c/c/e.  Strong foot pulses  PSYCH: Normally interactive. Conversant.    Assessment and Plan: Type 2 diabetes mellitus with complication, without long-term current use of insulin ([place]) - Plan: Comprehensive metabolic panel, Hemoglobin A1c, Microalbumin / creatinine urine ratio  Dyslipidemia - Plan: Lipid panel  Essential hypertension, benign - Plan: CBC, Comprehensive metabolic panel  Fatty liver - Plan: Lipid panel  Dark urine - Plan: Urine Culture  History of skin cancer - Plan: Ambulatory referral to Dermatology  Patient here today to follow-up on routine health maintenance She does have history of skin cancer, has noticed a tiny spot to the left of her nose that worries her.  I will get her referred  to dermatology for follow-up Await A1c will be in touch with her Encouraged her to work on increasing her strength and endurance again by gradually increasing walking on the treadmill.  Unfortunately her outdoor walks have been curtailed by  diarrhea  This visit occurred during the SARS-CoV-2 public health emergency.  Safety protocols were in place, including screening questions prior to the visit, additional usage of staff PPE, and extensive cleaning of exam room while observing appropriate contact time as indicated for disinfecting solutions.   Moderate medical decision making today Signed Lamar Blinks, MD  addnd 5/14- received her labs as below Known history of fatty liver A1c improved from previous - would like to increase glipizide  Called but did not reach,  Will try back  Called back and was able to reach patient.  We discussed her labs, overall relatively good news.  Her lipids are too high but triglycerides have dropped.  We will increase lipids to 5 mg twice daily Made her an appointment in 4 months   Results for orders placed or performed in visit on 04/14/20  Urine Culture   Specimen: Blood  Result Value Ref Range   MICRO NUMBER: [id number]    SPECIMEN QUALITY: Adequate    Sample Source NOT GIVEN    STATUS: FINAL    Result: No Growth   CBC  Result Value Ref Range   WBC 5.6 4.0 - 10.5 K/uL   RBC 4.53 3.87 - 5.11 Mil/uL   Platelets 240.0 150.0 - 400.0 K/uL   Hemoglobin 13.2 12.0 - 15.0 g/dL   HCT 39.1 36.0 - 46.0 %   MCV 86.3 78.0 - 100.0 fl   MCHC 33.7 30.0 - 36.0 g/dL   RDW 16.5 (H) 11.5 - 15.5 %  Comprehensive metabolic panel  Result Value Ref Range   Sodium 138 135 - 145 mEq/L   Potassium 4.0 3.5 - 5.1 mEq/L   Chloride 101 96 - 112 mEq/L   CO2 28 19 - 32 mEq/L   Glucose, Bld 119 (H) 70 - 99 mg/dL   BUN 14 6 - 23 mg/dL   Creatinine, Ser 0.77 0.40 - 1.20 mg/dL   Total Bilirubin 0.5 0.2 - 1.2 mg/dL   Alkaline Phosphatase 47 39 - 117 U/L   AST 76 (H) 0 - 37 U/L   ALT 57 (H) 0 - 35 U/L   Total Protein 7.1 6.0 - 8.3 g/dL   Albumin 4.5 3.5 - 5.2 g/dL   GFR 72.98 >60.00 mL/min   Calcium 9.5 8.4 - 10.5 mg/dL  Hemoglobin A1c  Result Value Ref Range   Hgb A1c MFr Bld 8.7 (H) 4.6 - 6.5 %  Lipid  panel  Result Value Ref Range   Cholesterol 168 0 - 200 mg/dL   Triglycerides (H) 0.0 - 149.0 mg/dL    506.0 Triglyceride is over 400; calculations on Lipids are invalid.   HDL 24.00 (L) >39.00 mg/dL   Total CHOL/HDL Ratio 7   Microalbumin / creatinine urine ratio  Result Value Ref Range   Microalb, Ur 2.9 (H) 0.0 - 1.9 mg/dL   Creatinine,U 60.8 mg/dL   Microalb Creat Ratio 4.8 0.0 - 30.0 mg/g  LDL cholesterol, direct  Result Value Ref Range   Direct LDL 65.0 mg/dL

## 2020-04-14 ENCOUNTER — Encounter: Payer: Self-pay | Admitting: Family Medicine

## 2020-04-14 ENCOUNTER — Other Ambulatory Visit: Payer: Self-pay

## 2020-04-14 ENCOUNTER — Other Ambulatory Visit: Payer: Self-pay | Admitting: Family Medicine

## 2020-04-14 ENCOUNTER — Ambulatory Visit (INDEPENDENT_AMBULATORY_CARE_PROVIDER_SITE_OTHER): Payer: Medicare HMO | Admitting: Family Medicine

## 2020-04-14 VITALS — BP 132/72 | HR 73 | Temp 96.0°F | Ht 64.0 in | Wt 180.5 lb

## 2020-04-14 DIAGNOSIS — R82998 Other abnormal findings in urine: Secondary | ICD-10-CM

## 2020-04-14 DIAGNOSIS — E785 Hyperlipidemia, unspecified: Secondary | ICD-10-CM

## 2020-04-14 DIAGNOSIS — Z85828 Personal history of other malignant neoplasm of skin: Secondary | ICD-10-CM | POA: Diagnosis not present

## 2020-04-14 DIAGNOSIS — E118 Type 2 diabetes mellitus with unspecified complications: Secondary | ICD-10-CM | POA: Diagnosis not present

## 2020-04-14 DIAGNOSIS — K219 Gastro-esophageal reflux disease without esophagitis: Secondary | ICD-10-CM

## 2020-04-14 DIAGNOSIS — K760 Fatty (change of) liver, not elsewhere classified: Secondary | ICD-10-CM | POA: Diagnosis not present

## 2020-04-14 DIAGNOSIS — I130 Hypertensive heart and chronic kidney disease with heart failure and stage 1 through stage 4 chronic kidney disease, or unspecified chronic kidney disease: Secondary | ICD-10-CM | POA: Diagnosis not present

## 2020-04-14 DIAGNOSIS — I509 Heart failure, unspecified: Secondary | ICD-10-CM | POA: Diagnosis not present

## 2020-04-14 DIAGNOSIS — I10 Essential (primary) hypertension: Secondary | ICD-10-CM

## 2020-04-14 LAB — COMPREHENSIVE METABOLIC PANEL
ALT: 57 U/L — ABNORMAL HIGH (ref 0–35)
AST: 76 U/L — ABNORMAL HIGH (ref 0–37)
Albumin: 4.5 g/dL (ref 3.5–5.2)
Alkaline Phosphatase: 47 U/L (ref 39–117)
BUN: 14 mg/dL (ref 6–23)
CO2: 28 mEq/L (ref 19–32)
Calcium: 9.5 mg/dL (ref 8.4–10.5)
Chloride: 101 mEq/L (ref 96–112)
Creatinine, Ser: 0.77 mg/dL (ref 0.40–1.20)
GFR: 72.98 mL/min (ref >60.00)
Glucose, Bld: 119 mg/dL — ABNORMAL HIGH (ref 70–99)
Potassium: 4 mEq/L (ref 3.5–5.1)
Sodium: 138 mEq/L (ref 135–145)
Total Bilirubin: 0.5 mg/dL (ref 0.2–1.2)
Total Protein: 7.1 g/dL (ref 6.0–8.3)

## 2020-04-14 LAB — MICROALBUMIN / CREATININE URINE RATIO
Creatinine,U: 60.8 mg/dL
Microalb Creat Ratio: 4.8 mg/g (ref 0.0–30.0)
Microalb, Ur: 2.9 mg/dL — ABNORMAL HIGH (ref 0.0–1.9)

## 2020-04-14 LAB — CBC
HCT: 39.1 % (ref 36.0–46.0)
Hemoglobin: 13.2 g/dL (ref 12.0–15.0)
MCHC: 33.7 g/dL (ref 30.0–36.0)
MCV: 86.3 fl (ref 78.0–100.0)
Platelets: 240 10*3/uL (ref 150.0–400.0)
RBC: 4.53 Mil/uL (ref 3.87–5.11)
RDW: 16.5 % — ABNORMAL HIGH (ref 11.5–15.5)
WBC: 5.6 10*3/uL (ref 4.0–10.5)

## 2020-04-14 LAB — LDL CHOLESTEROL, DIRECT: Direct LDL: 65 mg/dL

## 2020-04-14 LAB — LIPID PANEL
Cholesterol: 168 mg/dL (ref 0–200)
HDL: 24 mg/dL — ABNORMAL LOW (ref >39.00)
Total CHOL/HDL Ratio: 7
Triglycerides: 506 mg/dL — ABNORMAL HIGH (ref 0.0–149.0)

## 2020-04-14 LAB — HEMOGLOBIN A1C: Hgb A1c MFr Bld: 8.7 % — ABNORMAL HIGH (ref 4.6–6.5)

## 2020-04-15 LAB — URINE CULTURE
MICRO NUMBER:: 10469242
Result:: NO GROWTH
SPECIMEN QUALITY:: ADEQUATE

## 2020-04-16 MED ORDER — GLIPIZIDE 5 MG PO TABS: ORAL_TABLET | ORAL | 3 refills | Status: DC

## 2020-04-16 NOTE — Addendum Note (Signed)
Addended by: Lamar Blinks C on: 04/16/2020 04:56 PM   Modules accepted: Orders

## 2020-04-27 ENCOUNTER — Encounter: Payer: Self-pay | Admitting: Family Medicine

## 2020-04-27 ENCOUNTER — Other Ambulatory Visit: Payer: Self-pay

## 2020-04-27 DIAGNOSIS — E1142 Type 2 diabetes mellitus with diabetic polyneuropathy: Secondary | ICD-10-CM

## 2020-04-27 DIAGNOSIS — M609 Myositis, unspecified: Secondary | ICD-10-CM | POA: Insufficient documentation

## 2020-04-27 MED ORDER — GABAPENTIN 100 MG PO CAPS: 100.0000 mg | ORAL_CAPSULE | Freq: Two times a day (BID) | ORAL | 3 refills | Status: DC

## 2020-04-28 MED ORDER — GABAPENTIN 100 MG PO CAPS: 100.0000 mg | ORAL_CAPSULE | Freq: Two times a day (BID) | ORAL | 3 refills | Status: DC

## 2020-04-28 NOTE — Addendum Note (Signed)
Addended by: Wynonia Musty A on: 04/28/2020 09:47 AM   Modules accepted: Orders

## 2020-05-13 DIAGNOSIS — D0439 Carcinoma in situ of skin of other parts of face: Secondary | ICD-10-CM | POA: Diagnosis not present

## 2020-05-13 DIAGNOSIS — Z85828 Personal history of other malignant neoplasm of skin: Secondary | ICD-10-CM | POA: Diagnosis not present

## 2020-05-13 DIAGNOSIS — L821 Other seborrheic keratosis: Secondary | ICD-10-CM | POA: Diagnosis not present

## 2020-05-13 DIAGNOSIS — D485 Neoplasm of uncertain behavior of skin: Secondary | ICD-10-CM | POA: Diagnosis not present

## 2020-06-23 ENCOUNTER — Other Ambulatory Visit: Payer: Self-pay | Admitting: Family Medicine

## 2020-06-23 DIAGNOSIS — Z9889 Other specified postprocedural states: Secondary | ICD-10-CM

## 2020-06-28 ENCOUNTER — Telehealth: Payer: Self-pay | Admitting: Family Medicine

## 2020-06-28 DIAGNOSIS — Z5181 Encounter for therapeutic drug level monitoring: Secondary | ICD-10-CM

## 2020-06-28 MED ORDER — POTASSIUM CHLORIDE CRYS ER 20 MEQ PO TBCR: 20.0000 meq | EXTENDED_RELEASE_TABLET | Freq: Every day | ORAL | 1 refills | Status: DC

## 2020-06-28 NOTE — Telephone Encounter (Signed)
Medication refilled

## 2020-06-28 NOTE — Telephone Encounter (Signed)
Medication: potassium chloride SA (KLOR-CON) 20 MEQ tablet [[id number]]  Has the patient contacted their pharmacy? No. (If no, request that the patient contact the pharmacy for the refill.) (If yes, when and what did the pharmacy advise?)  Preferred Pharmacy (with phone number or street name): [id number] [address], [address]  [place] [place] [address]  Phone:  [phone] Fax:  [phone]  DEA #:  [id number]  Agent: Please be advised that RX refills may take up to 3 business days. We ask that you follow-up with your pharmacy.

## 2020-06-29 ENCOUNTER — Telehealth: Payer: Self-pay

## 2020-06-29 DIAGNOSIS — Z5181 Encounter for therapeutic drug level monitoring: Secondary | ICD-10-CM

## 2020-06-29 MED ORDER — POTASSIUM CHLORIDE CRYS ER 20 MEQ PO TBCR: 20.0000 meq | EXTENDED_RELEASE_TABLET | Freq: Every day | ORAL | 1 refills | Status: DC

## 2020-06-29 NOTE — Telephone Encounter (Signed)
Pt called stating her refill on potassium chloride SA (KLOR-CON) 20 MEQ tablet needed to go to [facility].

## 2020-06-29 NOTE — Telephone Encounter (Signed)
I have resent the medication to [facility] for patient.

## 2020-07-05 ENCOUNTER — Telehealth: Payer: Self-pay

## 2020-07-05 DIAGNOSIS — Z5181 Encounter for therapeutic drug level monitoring: Secondary | ICD-10-CM

## 2020-07-05 MED ORDER — POTASSIUM CHLORIDE CRYS ER 20 MEQ PO TBCR: 20.0000 meq | EXTENDED_RELEASE_TABLET | Freq: Every day | ORAL | 1 refills | Status: DC

## 2020-07-05 NOTE — Telephone Encounter (Signed)
Nurse Assessment Nurse: Hassell Done, RN, Melanie Date/Time Eilene Ghazi Time): 07/02/2020 6:15:51 PM Confirm and document reason for call. If symptomatic, describe symptoms. ---Caller states she needs her potassium refill called to [facility]. [facility] and she is not going to buy it at the drugstore when she can get it free at [facility]. Asked caller if she wanted me to call in a few days of medication NO but she was fine with a message being sent to office stating she needed potassium called to [facility]. Has the patient had close contact with a person known or suspected to have the novel coronavirus illness OR traveled / lives in area with major community spread (including international travel) in the last 14 days from the onset of symptoms? * If Asymptomatic, screen for exposure and travel within the last 14 days. ---No Does the patient have any new or worsening symptoms? ---No Disp. Time Eilene Ghazi Time) Disposition Final User 07/02/2020 6:21:44 PM Clinical Call Yes Hassell Done, RN, Threasa Beards

## 2020-07-05 NOTE — Telephone Encounter (Signed)
Medication refilled to [facility]

## 2020-08-13 ENCOUNTER — Other Ambulatory Visit: Payer: Self-pay | Admitting: Family Medicine

## 2020-08-13 DIAGNOSIS — E785 Hyperlipidemia, unspecified: Secondary | ICD-10-CM

## 2020-08-15 NOTE — Progress Notes (Addendum)
[place] at [facility] [address], [place],  [address] [phone]  Date:  08/18/2020   Name:  Mandy Kim   DOB:  [birth year]   MRN:  [id number]  PCP:  Darreld Mclean, MD    Chief Complaint: Diabetes (lab work)   History of Present Illness:  Mandy Kim is a 74 y.o. very pleasant female patient who presents with the following:  Patient here today for periodic follow-up visit Last seen by myself in May of this year At that time her main issue was incontinence.  She was seen both urology and GI for bowel and bladder incontinence problems History of poorly controlled diabetes, fatty liver, AAA status post repair 2013, hyperlipidemia, cervical cancer status post hysterectomy year 2000, bilateral breast cancer status post double mastectomy  She was recently dx with SCC of her face per dermatology- s/p treatment  Her glucose has been up and down - "depends on what I eat"  She is seeing her cardiologist next week-   She quit smoking about 20 years ago There is some family history of ?rheumatoid arthritis Married to [place] Plavix Gabapentin 100 twice daily Gemfibrozil Glipizide 5 twice daily Tresiba insulin 60 units daily Metformin 1000 twice daily Niacin Protonix Potassium  Covid series- declines Flu shot- declines Update A1c today  She has retained some fluid in her feet the last month or so Wt Readings from Last 3 Encounters:  08/18/20 185 lb (83.9 kg)  04/14/20 180 lb 8 oz (81.9 kg)  01/13/20 184 lb (83.5 kg)   ett last year  Blood pressure demonstrated a hypertensive response to exercise.  No T wave inversion was noted during stress.  There was no ST segment deviation noted during stress.  Blood pressure demonstrated a hypertensive response to exercise  Overall, the patient's exercise capacity was mildly impaired.  [facility] Treadmill Score: low risk   Patient Active Problem List    Diagnosis Date Noted  . Myositis, unspecified 04/27/2020  . Prolapse of female pelvic organs 05/24/2019  . Cystocele with prolapse 05/23/2019  . Hypoxia   . Sepsis ([place]) 05/28/2017  . Elevated LFTs 05/28/2017  . Acute lower UTI 05/28/2017  . Dehydration 05/28/2017  . Peripheral neuropathy 07/27/2014  . AAA (abdominal aortic aneurysm) ([place]) 02/12/2012  . Fatty liver 02/12/2012  . Breast cancer ([place]) 02/12/2012  . Cervical cancer ([place]) 02/12/2012  . Diabetes mellitus ([place]) 02/12/2012  . Hyperlipidemia 02/12/2012    Past Medical History:  Diagnosis Date  . Anticoagulant long-term use    plavix  . Cancer ([place])   . CHF (congestive heart failure) ([place]) 2000  . Chronic cystitis   . Chronic kidney disease   . Diverticulitis   . GERD (gastroesophageal reflux disease)   . Hiatal hernia   . History of basal cell carcinoma excision    face/ nose right side  . History of bilateral breast cancer followed by oncologist until 2008,  per pt no recurrence   right 2002  and left 2003 s/p  mastectomy with completed chemo and tamexifen therapy   . History of cervical cancer 2000   s/p  TAH w/ BSO  . History of diverticulitis of colon   . Hyperlipidemia   . OSA on CPAP   . PAD (peripheral artery disease) ([place])   . Peripheral neuropathy   . PONV (postoperative nausea and vomiting)   . S/P AAA (abdominal aortic aneurysm) repair 09/15/2011  . Stress  incontinence due to pelvic organ prolapse   . Type 2 diabetes mellitus treated with insulin ([place])    followed by pcp  . Wears glasses     Past Surgical History:  Procedure Laterality Date  . ABDOMINAL HYSTERECTOMY  2000   w/  BSO,  and Bladder tacking abdominally  . CARPAL TUNNEL RELEASE Left 1999  . CATARACT EXTRACTION W/ INTRAOCULAR LENS  IMPLANT, BILATERAL  2015  . ENDOVASCULAR REPAIR/STENT GRAFT  09-15-2011    _0   . INCONTINENCE SURGERY  2007  approx.  Marland Kitchen MASTECTOMY Bilateral right 2002;  left 2003   lymph node dissection done only  on right side  . ROBOTIC ASSISTED LAPAROSCOPIC SACROCOLPOPEXY N/A 05/23/2019   Procedure: XI ROBOTIC ASSISTED LAPAROSCOPIC SACROCOLPOPEXY;  Surgeon: Ardis Hughs, MD;  Location: [facility];  Service: Urology;  Laterality: N/A;  . TONSILLECTOMY  child  . TYMPANOPLASTY Right 1970s   "had my ear drum replacement with a plastic one"  . WRIST GANGLION EXCISION Left 1998    Social History   Tobacco Use  . Smoking status: Former Smoker    Years: 30.00    Types: Cigarettes    Quit date: 06/07/1999    Years since quitting: 21.2  . Smokeless tobacco: Never Used  Vaping Use  . Vaping Use: Never used  Substance Use Topics  . Alcohol use: Never  . Drug use: Never    Family History  Problem Relation Age of Onset  . Cancer Mother   . Pneumonia Mother   . Thyroid disease Mother   . Thyroid disease Sister   . Obesity Daughter   . Hypertension Daughter   . Thyroid disease Son   . Pneumonia Maternal Grandmother   . Heart attack Maternal Grandfather   . Diabetes Neg Hx     Allergies  Allergen Reactions  . Aspirin     Per pt any pain medications - causes fluid build up   . Bee Venom Swelling  . Ibuprofen Swelling  . Ozempic (0.25 Or 0.5 Mg-Dose) [Semaglutide(0.25 Or 0.54m-Dos)] Diarrhea and Nausea And Vomiting    Flatulence  . Poison Oak Extract Itching and Swelling  . Statins     "skin feels like bugs under the skin"  . Trulicity [Dulaglutide] Nausea And Vomiting and Other (See Comments)    Flatulance, "deathly sick"  . Tylenol [Acetaminophen] Swelling  . Victoza [Liraglutide] Other (See Comments)    Stomach upset issues     Medication list has been reviewed and updated.  Current Outpatient Medications on File Prior to Visit  Medication Sig Dispense Refill  . ACCU-CHEK AVIVA PLUS test strip TEST BLOOD SUGAR THREE TIMES DAILY 300 strip 12  . Accu-Chek Softclix Lancets lancets TEST BLOOD SUGAR THREE TIMES DAILY 300 each 1  . Alcohol Swabs (B-D SINGLE USE SWABS REGULAR) PADS  USE TO TEST BLOOD SUGAR 3 TIMES DAILY.   300 each 2  . Alcohol Swabs PADS Use to test blood sugar 3 times daily. Dx: E11.9 300 each 2  . b complex vitamins tablet Take 0.5 tablets by mouth 2 (two) times daily.     . Biotin 5 MG TABS Take 5,000 mcg by mouth at bedtime.     . Blood Glucose Calibration (GLUCOSE CONTROL) SOLN Use to test blood sugar 3 times daily. Dx: E11.9 1 each 2  . Blood Glucose Monitoring Suppl (ACCU-CHEK AVIVA PLUS) w/Device KIT USE AS DIRECTED  TO TEST BLOOD GLUCOSE 1 kit 0  . clopidogrel (PLAVIX) 75 MG tablet TAKE  1 TABLET (75 MG TOTAL) BY MOUTH DAILY. 90 tablet 3  . Cranberry 500 MG TABS Take 500 mg by mouth daily.    Marland Kitchen gabapentin (NEURONTIN) 100 MG capsule Take 1 capsule (100 mg total) by mouth 2 (two) times daily. 180 capsule 3  . gemfibrozil (LOPID) 600 MG tablet TAKE 1 TABLET (600 MG TOTAL) BY MOUTH 2 (TWO) TIMES DAILY BEFORE A MEAL. 180 tablet 1  . glipiZIDE (GLUCOTROL) 5 MG tablet Take 19m twice daily with meals 180 tablet 3  . insulin degludec (TRESIBA FLEXTOUCH) 100 UNIT/ML SOPN FlexTouch Pen 60 units subque daily (Patient taking differently: Inject 60 Units into the skin at bedtime. 60 units subque daily) 3 pen 6  . Insulin Pen Needle 32G X 4 MM MISC Used to inject insulin 1x daily. 100 each 4  . Insulin Syringe-Needle U-100 (INSULIN SYRINGE 1CC/30GX5/16") 30G X 5/16" 1 ML MISC Use to inject insulin 1 time per day. 100 each 2  . magnesium oxide (MAG-OX) 400 MG tablet Take 200 mg by mouth daily.    . Menthol, Topical Analgesic, (BIOFREEZE ROLL-ON EX) Apply 1 application topically as needed (joint pain).    . metFORMIN (GLUCOPHAGE) 1000 MG tablet TAKE 1 TABLET TWICE DAILY WITH A MEAL 180 tablet 1  . Multiple Vitamin (MULTIVITAMIN) tablet Take 0.5 tablets by mouth 2 (two) times daily.     . niacin (NIASPAN) 500 MG CR tablet Take 1 tablet (500 mg total) by mouth at bedtime. 30 tablet 3  . Omega-3 Fatty Acids (FISH OIL) 1000 MG CAPS Take 1,000 mg by mouth 2 (two) times a  day.     . pantoprazole (PROTONIX) 40 MG tablet TAKE 1 TABLET EVERY DAY 90 tablet 1  . potassium chloride SA (KLOR-CON) 20 MEQ tablet Take 1 tablet (20 mEq total) by mouth daily. 90 tablet 1  . Probiotic Product (PROBIOTIC DAILY PO) Take 1 capsule by mouth daily.    .Marland KitchenVITAMIN D PO Take 1 capsule by mouth daily.     .Marland Kitchenzinc gluconate 50 MG tablet Take 50 mg by mouth at bedtime.     No current facility-administered medications on file prior to visit.    Review of Systems:  As per HPI- otherwise negative.   Physical Examination: Vitals:   08/18/20 1301  BP: 132/80  Pulse: 68  Resp: 17  SpO2: 92%   Vitals:   08/18/20 1301  Weight: 185 lb (83.9 kg)  Height: _0  (1.626 m)   Body mass index is 31.76 kg/m. Ideal Body Weight: Weight in (lb) to have BMI = 25: 145.3  GEN: no acute distress.  Obese, looks well Recnet derm surgery left cheek looks great  HEENT: Atraumatic, Normocephalic.  Ears and Nose: No external deformity. CV: RRR, No M/G/R. No JVD. No thrill. No extra heart sounds. PULM: CTA B, no wheezes, crackles, rhonchi. No retractions. No resp. distress. No accessory muscle use. ABD: S, NT, ND EXTR: No c/c.  Trace to 1+ edema of both LE  PSYCH: Normally interactive. Conversant.    Assessment and Plan: Dyslipidemia  Essential hypertension, benign - Plan: Basic metabolic panel  Type 2 diabetes mellitus with complication, without long-term current use of insulin (HCC) - Plan: Hemoglobin A1c  Arthralgia, unspecified joint - Plan: Sedimentation rate, C-reactive protein, Rheumatoid Factor, Cyclic citrul peptide antibody, IgG ([facility])  SOB (shortness of breath) - Plan: B Nat Peptide  Pt with history of poorly controlled DM- will check her A1c today Routine labs pending as above She  notes many painful joints and possible family history of RA (questionable)- will do lab eval today Pt declines left hip x-ray today- she mentioned this joint is especially  bothersome Encouraged covid and flu shots Will plan further follow- up pending labs.  This visit occurred during the SARS-CoV-2 public health emergency.  Safety protocols were in place, including screening questions prior to the visit, additional usage of staff PPE, and extensive cleaning of exam room while observing appropriate contact time as indicated for disinfecting solutions.    Signed Lamar Blinks, MD  Addendum 9/16, received her labs as below Letter to patient  Results for orders placed or performed in visit on 08/18/20  Hemoglobin A1c  Result Value Ref Range   Hgb A1c MFr Bld 8.3 (H) <5.7 % of total Hgb   Mean Plasma Glucose 192 (calc)   eAG (mmol/L) 10.6 (calc)  Basic metabolic panel  Result Value Ref Range   Glucose, Bld 104 (H) 65 - 99 mg/dL   BUN 23 7 - 25 mg/dL   Creat 0.77 0.60 - 0.93 mg/dL   BUN/Creatinine Ratio NOT APPLICABLE 6 - 22 (calc)   Sodium 139 135 - 146 mmol/L   Potassium 4.3 3.5 - 5.3 mmol/L   Chloride 103 98 - 110 mmol/L   CO2 23 20 - 32 mmol/L   Calcium 9.7 8.6 - 10.4 mg/dL  Sedimentation rate  Result Value Ref Range   Sed Rate 28 0 - 30 mm/h  C-reactive protein  Result Value Ref Range   CRP 3.3 <8.0 mg/L  Rheumatoid Factor  Result Value Ref Range   Rhuematoid fact SerPl-aCnc <59 <47 IU/mL  Cyclic citrul peptide antibody, IgG ([facility])  Result Value Ref Range   Cyclic Citrullin Peptide Ab <16 UNITS  B Nat Peptide  Result Value Ref Range   Brain Natriuretic Peptide 34 <100 pg/mL

## 2020-08-18 ENCOUNTER — Other Ambulatory Visit: Payer: Self-pay

## 2020-08-18 ENCOUNTER — Ambulatory Visit (INDEPENDENT_AMBULATORY_CARE_PROVIDER_SITE_OTHER): Payer: Medicare HMO | Admitting: Family Medicine

## 2020-08-18 ENCOUNTER — Encounter: Payer: Self-pay | Admitting: Family Medicine

## 2020-08-18 VITALS — BP 132/80 | HR 68 | Resp 17 | Ht 64.0 in | Wt 185.0 lb

## 2020-08-18 DIAGNOSIS — E118 Type 2 diabetes mellitus with unspecified complications: Secondary | ICD-10-CM

## 2020-08-18 DIAGNOSIS — I10 Essential (primary) hypertension: Secondary | ICD-10-CM

## 2020-08-18 DIAGNOSIS — R0602 Shortness of breath: Secondary | ICD-10-CM

## 2020-08-18 DIAGNOSIS — E785 Hyperlipidemia, unspecified: Secondary | ICD-10-CM

## 2020-08-18 DIAGNOSIS — M2550 Pain in unspecified joint: Secondary | ICD-10-CM

## 2020-08-18 NOTE — Patient Instructions (Addendum)
It was great to see you again today- I will be in touch with your labs asap  Please consider getting your flu shot.  I would also encourage you to get your covid 19 series- these are our best defense against serious covid 19 pneumonia and death

## 2020-08-19 LAB — BASIC METABOLIC PANEL
BUN: 23 mg/dL (ref 7–25)
CO2: 23 mmol/L (ref 20–32)
Calcium: 9.7 mg/dL (ref 8.6–10.4)
Chloride: 103 mmol/L (ref 98–110)
Creat: 0.77 mg/dL (ref 0.60–0.93)
Glucose, Bld: 104 mg/dL — ABNORMAL HIGH (ref 65–99)
Potassium: 4.3 mmol/L (ref 3.5–5.3)
Sodium: 139 mmol/L (ref 135–146)

## 2020-08-19 LAB — C-REACTIVE PROTEIN: CRP: 3.3 mg/L (ref <8.0)

## 2020-08-19 LAB — HEMOGLOBIN A1C
Hgb A1c MFr Bld: 8.3 % of total Hgb — ABNORMAL HIGH (ref <5.7)
Mean Plasma Glucose: 192 (calc)
eAG (mmol/L): 10.6 (calc)

## 2020-08-19 LAB — RHEUMATOID FACTOR: Rheumatoid fact SerPl-aCnc: <14 IU/mL (ref <14)

## 2020-08-19 LAB — CYCLIC CITRUL PEPTIDE ANTIBODY, IGG: Cyclic Citrullin Peptide Ab: <16 UNITS

## 2020-08-19 LAB — SEDIMENTATION RATE: Sed Rate: 28 mm/h (ref 0–30)

## 2020-08-19 LAB — BRAIN NATRIURETIC PEPTIDE: Brain Natriuretic Peptide: 34 pg/mL (ref <100)

## 2020-08-24 DIAGNOSIS — I714 Abdominal aortic aneurysm, without rupture: Secondary | ICD-10-CM | POA: Diagnosis not present

## 2020-08-24 DIAGNOSIS — Z48812 Encounter for surgical aftercare following surgery on the circulatory system: Secondary | ICD-10-CM | POA: Diagnosis not present

## 2020-09-08 DIAGNOSIS — L821 Other seborrheic keratosis: Secondary | ICD-10-CM | POA: Diagnosis not present

## 2020-09-08 DIAGNOSIS — X32XXXS Exposure to sunlight, sequela: Secondary | ICD-10-CM | POA: Diagnosis not present

## 2020-09-08 DIAGNOSIS — D485 Neoplasm of uncertain behavior of skin: Secondary | ICD-10-CM | POA: Diagnosis not present

## 2020-09-08 DIAGNOSIS — Z85828 Personal history of other malignant neoplasm of skin: Secondary | ICD-10-CM | POA: Diagnosis not present

## 2020-09-08 DIAGNOSIS — D2372 Other benign neoplasm of skin of left lower limb, including hip: Secondary | ICD-10-CM | POA: Diagnosis not present

## 2020-09-08 DIAGNOSIS — L570 Actinic keratosis: Secondary | ICD-10-CM | POA: Diagnosis not present

## 2020-09-08 DIAGNOSIS — L814 Other melanin hyperpigmentation: Secondary | ICD-10-CM | POA: Diagnosis not present

## 2020-09-20 ENCOUNTER — Other Ambulatory Visit: Payer: Self-pay | Admitting: Family Medicine

## 2020-09-20 DIAGNOSIS — E118 Type 2 diabetes mellitus with unspecified complications: Secondary | ICD-10-CM

## 2020-09-20 DIAGNOSIS — K219 Gastro-esophageal reflux disease without esophagitis: Secondary | ICD-10-CM

## 2020-09-28 ENCOUNTER — Telehealth: Payer: Self-pay | Admitting: Family Medicine

## 2020-09-28 NOTE — Telephone Encounter (Signed)
Patient would like to discuss her lab results.

## 2020-09-30 NOTE — Telephone Encounter (Signed)
Discussed lab results with patient. I have scheduled follow up per pcp recommendation and have resent letter with results in mail to patient.

## 2020-10-30 ENCOUNTER — Other Ambulatory Visit: Payer: Self-pay | Admitting: Family Medicine

## 2020-11-01 ENCOUNTER — Other Ambulatory Visit: Payer: Self-pay | Admitting: Family Medicine

## 2020-12-02 ENCOUNTER — Telehealth: Payer: Self-pay | Admitting: Family Medicine

## 2020-12-02 NOTE — Telephone Encounter (Signed)
Left message to return call 

## 2020-12-02 NOTE — Telephone Encounter (Signed)
Please give her a call back.  Taking diclofenac may increase her risk of bleeding as she is already on plavix.  Is she talking about diclofenac by mouth (as opposed to topical gel).  What is troubling her?  Thank you!

## 2020-12-02 NOTE — Telephone Encounter (Signed)
Patient would like to know if she can take diclofenac sodium and would like it interfere with her current medication.  Please Advise

## 2020-12-06 NOTE — Telephone Encounter (Signed)
Spoke with patient. She was wondering if she could take the pill as she is taking the gel now. However I explained risk of bleeding being on plavix. She states she will continue with gel.

## 2021-01-03 DIAGNOSIS — N8111 Cystocele, midline: Secondary | ICD-10-CM | POA: Diagnosis not present

## 2021-01-03 DIAGNOSIS — N3020 Other chronic cystitis without hematuria: Secondary | ICD-10-CM | POA: Diagnosis not present

## 2021-01-10 ENCOUNTER — Other Ambulatory Visit: Payer: Self-pay | Admitting: Family Medicine

## 2021-01-10 DIAGNOSIS — E118 Type 2 diabetes mellitus with unspecified complications: Secondary | ICD-10-CM

## 2021-01-17 ENCOUNTER — Other Ambulatory Visit: Payer: Self-pay | Admitting: Family Medicine

## 2021-01-17 DIAGNOSIS — E785 Hyperlipidemia, unspecified: Secondary | ICD-10-CM

## 2021-01-18 DIAGNOSIS — H04123 Dry eye syndrome of bilateral lacrimal glands: Secondary | ICD-10-CM | POA: Diagnosis not present

## 2021-01-18 DIAGNOSIS — H02831 Dermatochalasis of right upper eyelid: Secondary | ICD-10-CM | POA: Diagnosis not present

## 2021-01-18 DIAGNOSIS — H02834 Dermatochalasis of left upper eyelid: Secondary | ICD-10-CM | POA: Diagnosis not present

## 2021-01-18 DIAGNOSIS — Z961 Presence of intraocular lens: Secondary | ICD-10-CM | POA: Diagnosis not present

## 2021-01-18 DIAGNOSIS — E119 Type 2 diabetes mellitus without complications: Secondary | ICD-10-CM | POA: Diagnosis not present

## 2021-01-18 LAB — HM DIABETES EYE EXAM

## 2021-01-28 ENCOUNTER — Other Ambulatory Visit: Payer: Self-pay | Admitting: Family Medicine

## 2021-01-28 DIAGNOSIS — E118 Type 2 diabetes mellitus with unspecified complications: Secondary | ICD-10-CM

## 2021-01-31 ENCOUNTER — Other Ambulatory Visit: Payer: Self-pay | Admitting: Family Medicine

## 2021-01-31 DIAGNOSIS — Z5181 Encounter for therapeutic drug level monitoring: Secondary | ICD-10-CM

## 2021-02-10 ENCOUNTER — Telehealth: Payer: Self-pay | Admitting: Family Medicine

## 2021-02-10 NOTE — Telephone Encounter (Signed)
Patient declined AWV do not call  

## 2021-02-26 NOTE — Progress Notes (Addendum)
[place] at [facility] [address], [place], [place] [address] [phone]  Date:  02/28/2021   Name:  Mandy Kim   DOB:  1946/06/24   MRN:  [id number]  PCP:  Darreld Mclean, MD    Chief Complaint: Diabetes (6 month follow up/) and trouble ambulating (Lower back pain, pinches nerve, legs feel week/)   History of Present Illness:  Mandy Kim is a 75 y.o. very pleasant female patient who presents with the following:  Patient today for 93-monthfollow-up visit.  Last seen by myself in September  History of poorly controlled diabetes, fatty liver, AAA status post repair 2013, hyperlipidemia, cervical cancer status post hysterectomy year 2000, bilateral breast cancer status post double mastectomy She did a treadmill test in 2020, reassuring  She notes she is having pain in both her upper legs and her leg muscles seem to be weak She notes that she does not get claudication sx in her calves This has been the case off an on for years, gradually getting worse  She did some sort of "shock treatment" in her neck which helped with her headache-she has an over-the-counter device which she bought, was advertised by [employer]  She notes this did seem to help.  I am not quite sure what this was, perhaps a TENS unit She thought that perhaps doing this treatment helped the pain in her legs, I explained that typically a nerve or disc problem affecting the legs would be located lower in her back, in the lumbar region She gets numbness in her toes but no other weakness or numbness in her legs  Lab Results  Component Value Date   HGBA1C 8.3 (H) 08/18/2020   COVID-19 vaccine-patient has declined Foot exam-update today Can update A1c Can update urine microalbumin if possible Colonoscopy 2013, 5-year follow-up was recommended- she declines to continue colon cancer screening  Plavix Gabapentin 100 twice daily Gemfibrozil Glipizide  5 twice daily Tresiba 60 units Metformin at thousand twice daily Niaspan Protonix Potassium 20 mEq daily  Wt Readings from Last 3 Encounters:  02/28/21 181 lb (82.1 kg)  08/18/20 185 lb (83.9 kg)  04/14/20 180 lb 8 oz (81.9 kg)   She has lost a few lbs   urologist is Dr hLouis Meckel she is taking keflex 250 daily as a UTI preventative  Patient Active Problem List   Diagnosis Date Noted  . Myositis, unspecified 04/27/2020  . Prolapse of female pelvic organs 05/24/2019  . Cystocele with prolapse 05/23/2019  . Hypoxia   . Sepsis ([place] 05/28/2017  . Elevated LFTs 05/28/2017  . Acute lower UTI 05/28/2017  . Dehydration 05/28/2017  . Peripheral neuropathy 07/27/2014  . AAA (abdominal aortic aneurysm) ([place] 02/12/2012  . Fatty liver 02/12/2012  . Breast cancer ([place] 02/12/2012  . Cervical cancer ([place] 02/12/2012  . Diabetes mellitus ([place] 02/12/2012  . Hyperlipidemia 02/12/2012    Past Medical History:  Diagnosis Date  . Anticoagulant long-term use    plavix  . Cancer ([facility]   . CHF (congestive heart failure) ([place] 2000  . Chronic cystitis   . Chronic kidney disease   . Diverticulitis   . GERD (gastroesophageal reflux disease)   . Hiatal hernia   . History of basal cell carcinoma excision    face/ nose right side  . History of bilateral breast cancer followed by oncologist until 2008,  per pt no recurrence   right 2002  and left  2003 s/p  mastectomy with completed chemo and tamexifen therapy   . History of cervical cancer 2000   s/p  TAH w/ BSO  . History of diverticulitis of colon   . Hyperlipidemia   . OSA on CPAP   . PAD (peripheral artery disease) ([place])   . Peripheral neuropathy   . PONV (postoperative nausea and vomiting)   . S/P AAA (abdominal aortic aneurysm) repair 09/15/2011  . Stress incontinence due to pelvic organ prolapse   . Type 2 diabetes mellitus treated with insulin ([place])    followed by pcp  . Wears glasses     Past Surgical History:  Procedure  Laterality Date  . ABDOMINAL HYSTERECTOMY  2000   w/  BSO,  and Bladder tacking abdominally  . CARPAL TUNNEL RELEASE Left 1999  . CATARACT EXTRACTION W/ INTRAOCULAR LENS  IMPLANT, BILATERAL  2015  . ENDOVASCULAR REPAIR/STENT GRAFT  09-15-2011    @[facility]   . INCONTINENCE SURGERY  2007  approx.  Marland Kitchen MASTECTOMY Bilateral right 2002;  left 2003   lymph node dissection done only on right side  . ROBOTIC ASSISTED LAPAROSCOPIC SACROCOLPOPEXY N/A 05/23/2019   Procedure: XI ROBOTIC ASSISTED LAPAROSCOPIC SACROCOLPOPEXY;  Surgeon: Ardis Hughs, MD;  Location: [facility];  Service: Urology;  Laterality: N/A;  . TONSILLECTOMY  child  . TYMPANOPLASTY Right 1970s   "had my ear drum replacement with a plastic one"  . WRIST GANGLION EXCISION Left 1998    Social History   Tobacco Use  . Smoking status: Former Smoker    Years: 30.00    Types: Cigarettes    Quit date: 06/07/1999    Years since quitting: 21.7  . Smokeless tobacco: Never Used  Vaping Use  . Vaping Use: Never used  Substance Use Topics  . Alcohol use: Never  . Drug use: Never    Family History  Problem Relation Age of Onset  . Cancer Mother   . Pneumonia Mother   . Thyroid disease Mother   . Thyroid disease Sister   . Obesity Daughter   . Hypertension Daughter   . Thyroid disease Son   . Pneumonia Maternal Grandmother   . Heart attack Maternal Grandfather   . Diabetes Neg Hx     Allergies  Allergen Reactions  . Aspirin     Per pt any pain medications - causes fluid build up   . Bee Venom Swelling  . Ibuprofen Swelling  . Ozempic (0.25 Or 0.5 Mg-Dose) [Semaglutide(0.25 Or 0.81m-Dos)] Diarrhea and Nausea And Vomiting    Flatulence  . Poison Oak Extract Itching and Swelling  . Statins     "skin feels like bugs under the skin"  . Trulicity [Dulaglutide] Nausea And Vomiting and Other (See Comments)    Flatulance, "deathly sick"  . Tylenol [Acetaminophen] Swelling  . Victoza [Liraglutide] Other (See Comments)    Stomach  upset issues     Medication list has been reviewed and updated.  Current Outpatient Medications on File Prior to Visit  Medication Sig Dispense Refill  . ACCU-CHEK AVIVA PLUS test strip TEST BLOOD SUGAR THREE TIMES DAILY 300 strip 12  . Accu-Chek Softclix Lancets lancets TEST BLOOD SUGAR THREE TIMES DAILY 300 each 1  . Alcohol Swabs (B-D SINGLE USE SWABS REGULAR) PADS USE TO TEST BLOOD SUGAR 3 TIMES DAILY. 300 each 2  . Alcohol Swabs PADS Use to test blood sugar 3 times daily. Dx: E11.9 300 each 2  . b complex vitamins tablet Take 0.5 tablets by mouth  2 (two) times daily.     . Biotin 5 MG TABS Take 5,000 mcg by mouth at bedtime.     . Blood Glucose Calibration (GLUCOSE CONTROL) SOLN Use to test blood sugar 3 times daily. Dx: E11.9 1 each 2  . Blood Glucose Monitoring Suppl (ACCU-CHEK AVIVA PLUS) w/Device KIT USE AS DIRECTED  TO TEST BLOOD GLUCOSE 1 kit 0  . clopidogrel (PLAVIX) 75 MG tablet TAKE 1 TABLET (75 MG TOTAL) BY MOUTH DAILY. 90 tablet 3  . Cranberry 500 MG TABS Take 500 mg by mouth daily.    Marland Kitchen gabapentin (NEURONTIN) 100 MG capsule Take 1 capsule (100 mg total) by mouth 2 (two) times daily. 180 capsule 3  . gemfibrozil (LOPID) 600 MG tablet Take 1 tablet (600 mg total) by mouth 2 (two) times daily before a meal. 180 tablet 1  . glipiZIDE (GLUCOTROL) 5 MG tablet Take 1 tablet (5 mg total) by mouth 2 (two) times daily before a meal. 180 tablet 0  . insulin degludec (TRESIBA FLEXTOUCH) 100 UNIT/ML FlexTouch Pen Inject 60 Units into the skin at bedtime. 60 mL 3  . Insulin Pen Needle 32G X 4 MM MISC Used to inject insulin 1x daily. 100 each 4  . Insulin Syringe-Needle U-100 (INSULIN SYRINGE 1CC/30GX5/16") 30G X 5/16" 1 ML MISC Use to inject insulin 1 time per day. 100 each 2  . magnesium oxide (MAG-OX) 400 MG tablet Take 200 mg by mouth daily.    . Menthol, Topical Analgesic, (BIOFREEZE ROLL-ON EX) Apply 1 application topically as needed (joint pain).    . metFORMIN (GLUCOPHAGE) 1000 MG  tablet TAKE 1 TABLET TWICE DAILY WITH A MEAL 180 tablet 1  . Multiple Vitamin (MULTIVITAMIN) tablet Take 0.5 tablets by mouth 2 (two) times daily.     . niacin (NIASPAN) 500 MG CR tablet Take 1 tablet (500 mg total) by mouth at bedtime. 30 tablet 3  . Omega-3 Fatty Acids (FISH OIL) 1000 MG CAPS Take 1,000 mg by mouth 2 (two) times a day.     . pantoprazole (PROTONIX) 40 MG tablet TAKE 1 TABLET EVERY DAY 90 tablet 1  . potassium chloride SA (KLOR-CON) 20 MEQ tablet Take 1 tablet (20 mEq total) by mouth daily. 90 tablet 1  . Probiotic Product (PROBIOTIC DAILY PO) Take 1 capsule by mouth daily.    Marland Kitchen VITAMIN D PO Take 1 capsule by mouth daily.     Marland Kitchen zinc gluconate 50 MG tablet Take 50 mg by mouth at bedtime.     No current facility-administered medications on file prior to visit.    Review of Systems:  As per HPI- otherwise negative.   Physical Examination: Vitals:   02/28/21 1257  BP: 130/82  Pulse: 87  Resp: 18  Temp: (!) 97.4 F (36.3 C)  SpO2: 91%   Vitals:   02/28/21 1257  Weight: 181 lb (82.1 kg)  Height: 5' 4"  (1.626 m)   Body mass index is 31.07 kg/m. Ideal Body Weight: Weight in (lb) to have BMI = 25: 145.3  GEN: no acute distress.  Obese, otherwise looks well HEENT: Atraumatic, Normocephalic.  Ears and Nose: No external deformity. CV: RRR, No M/G/R. No JVD. No thrill. No extra heart sounds. PULM: CTA B, no wheezes, crackles, rhonchi. No retractions. No resp. distress. No accessory muscle use. ABD: S, NT, ND, +BS. No rebound. No HSM. EXTR: No c/c/e PSYCH: Normally interactive. Conversant.  Performed foot exam today-normal She has noted tenderness in her neck, but this  is currently resolved due to the home shock treatment that she did.  She has normal cervical range of motion Normal bilateral lower extremity strength and sensation today.  I am not able to pinpoint the location of pain in her lower back Assessment and Plan: Type 2 diabetes mellitus with  complication, without long-term current use of insulin ([place]) - Plan: Hemoglobin A1c, Urine Microalbumin w/creat. ratio  Dyslipidemia - Plan: Lipid panel  Essential hypertension, benign - Plan: CBC, Comprehensive metabolic panel  SOB (shortness of breath)  Fatty liver - Plan: Comprehensive metabolic panel  Medication monitoring encounter  Fatigue, unspecified type - Plan: CBC, TSH, VITAMIN D 25 Hydroxy (Vit-D Deficiency, Fractures)  Neck pain - Plan: DG Cervical Spine Complete  Chronic bilateral low back pain without sciatica - Plan: DG Lumbar Spine Complete  Recurrent UTI - Plan: cephALEXin (KEFLEX) 250 MG capsule  Patient here today for follow-up in a couple of concerns.  Routine labs are pending as above.  We discussed her health maintenance Will obtain plain films of her neck and lumbar spine today due to start of pain and possible sciatica in her legs She will let me know if getting worse, otherwise I will be in touch with her reports as soon as possible Right now her symptoms are not overly problematic, she is doing okay with current over-the-counter treatments This visit occurred during the SARS-CoV-2 public health emergency.  Safety protocols were in place, including screening questions prior to the visit, additional usage of staff PPE, and extensive cleaning of exam room while observing appropriate contact time as indicated for disinfecting solutions.    Signed Lamar Blinks, MD  Addendum 3/30, received her labs and x-rays as below.  Called patient  DG Cervical Spine Complete  Result Date: 03/01/2021 CLINICAL DATA:  Cervical disc herniations. EXAM: CERVICAL SPINE - COMPLETE 4+ VIEW COMPARISON:  None. FINDINGS: Prevertebral soft tissues are within normal limits. Slight degenerative anterolisthesis is present at C6-7. Straightening of the normal cervical lordosis is noted. Uncovertebral spurring contributes to foraminal narrowing bilaterally at C5-6 and C6-7, left greater  than right. Vertebral body heights are maintained. Lung apices are clear. Atherosclerotic calcifications are noted at the aortic arch. IMPRESSION: 1. Uncovertebral spurring contributes to bilateral foraminal narrowing at C5-6 and C6-7, left greater than right. 2. Straightening of the normal cervical lordosis. 3. No acute abnormality. Electronically Signed   By: San Morelle M.D.   On: 03/01/2021 09:54   DG Lumbar Spine Complete  Result Date: 03/01/2021 CLINICAL DATA:  Low back pain.  Bilateral leg weakness. EXAM: LUMBAR SPINE - COMPLETE 4+ VIEW COMPARISON:  None. FINDINGS: Aortobifemoral stent graft is in place. 5 non rib-bearing lumbar type vertebral bodies are present. No significant listhesis is present. Lumbar lordosis is preserved. Mild leftward curvature is present in the lumbar spine. Bilateral facet degenerative changes are evident at L4-5 and L5-S1. Vascular calcifications present. Bowel gas pattern unremarkable. IMPRESSION: 1. Aortobifemoral stent graft. 2. Mild leftward curvature of the lumbar spine. 3. Bilateral facet degenerative changes at L4-5 and L5-S1. Electronically Signed   By: San Morelle M.D.   On: 03/01/2021 09:52   Connected with patient on the telephone to discuss her results.  Went over x-ray findings as above.  For the time being she is not interested in any further evaluation such as MRI A1c shows some improvement, down from 8.3 in September Known history of fatty liver LFT elevation is stable to improved TSH is elevated.  She has not previously been treated  for hypothyroidism-she is willing to start on levothyroxine.  I asked her to come in for labs only in about 2 months to recheck TSH She is not able to take a statin.  I offered to start a PCSK9, she declines for the time being  Otherwise, routine recheck in 6 months Results for orders placed or performed in visit on 02/28/21  CBC  Result Value Ref Range   WBC 6.8 4.0 - 10.5 K/uL   RBC 4.69 3.87 - 5.11  Mil/uL   Platelets 255.0 150.0 - 400.0 K/uL   Hemoglobin 13.7 12.0 - 15.0 g/dL   HCT 40.0 36.0 - 46.0 %   MCV 85.4 78.0 - 100.0 fl   MCHC 34.3 30.0 - 36.0 g/dL   RDW 15.8 (H) 11.5 - 15.5 %  Comprehensive metabolic panel  Result Value Ref Range   Sodium 139 135 - 145 mEq/L   Potassium 4.2 3.5 - 5.1 mEq/L   Chloride 101 96 - 112 mEq/L   CO2 26 19 - 32 mEq/L   Glucose, Bld 102 (H) 70 - 99 mg/dL   BUN 26 (H) 6 - 23 mg/dL   Creatinine, Ser 0.87 0.40 - 1.20 mg/dL   Total Bilirubin 0.5 0.2 - 1.2 mg/dL   Alkaline Phosphatase 40 39 - 117 U/L   AST 57 (H) 0 - 37 U/L   ALT 51 (H) 0 - 35 U/L   Total Protein 7.6 6.0 - 8.3 g/dL   Albumin 4.7 3.5 - 5.2 g/dL   GFR 64.74 >60.00 mL/min   Calcium 10.1 8.4 - 10.5 mg/dL  Hemoglobin A1c  Result Value Ref Range   Hgb A1c MFr Bld 8.0 (H) 4.6 - 6.5 %  Lipid panel  Result Value Ref Range   Cholesterol 236 (H) 0 - 200 mg/dL   Triglycerides (H) 0.0 - 149.0 mg/dL    452.0 Triglyceride is over 400; calculations on Lipids are invalid.   HDL 27.30 (L) >39.00 mg/dL   Total CHOL/HDL Ratio 9   TSH  Result Value Ref Range   TSH 6.41 (H) 0.35 - 4.50 uIU/mL  VITAMIN D 25 Hydroxy (Vit-D Deficiency, Fractures)  Result Value Ref Range   VITD 51.89 30.00 - 100.00 ng/mL  Urine Microalbumin w/creat. ratio  Result Value Ref Range   Microalb, Ur 4.3 (H) 0.0 - 1.9 mg/dL   Creatinine,U 68.7 mg/dL   Microalb Creat Ratio 6.3 0.0 - 30.0 mg/g  LDL cholesterol, direct  Result Value Ref Range   Direct LDL 129.0 mg/dL

## 2021-02-26 NOTE — Patient Instructions (Addendum)
It was good to see you again today, I will be in touch with your labs soon as possible.  Assuming all is well, please see me in about 6 months After labs please go to imaging on the ground floor to have x-rays of your neck and lower back I will be in touch also with your x-ray reports Your idea to try a back brace/ abdominal binder is fine- this may be helpful

## 2021-02-28 ENCOUNTER — Encounter: Payer: Self-pay | Admitting: Family Medicine

## 2021-02-28 ENCOUNTER — Ambulatory Visit (HOSPITAL_BASED_OUTPATIENT_CLINIC_OR_DEPARTMENT_OTHER)
Admission: RE | Admit: 2021-02-28 | Discharge: 2021-02-28 | Disposition: A | Discharge status: Home / Self Care | Payer: Medicare HMO | Source: Ambulatory Visit | Attending: Family Medicine | Admitting: Family Medicine

## 2021-02-28 ENCOUNTER — Ambulatory Visit (HOSPITAL_COMMUNITY)
Admission: RE | Admit: 2021-02-28 | Discharge: 2021-02-28 | Disposition: A | Discharge status: Home / Self Care | Payer: Medicare HMO | Source: Ambulatory Visit | Attending: Family Medicine | Admitting: Family Medicine

## 2021-02-28 ENCOUNTER — Ambulatory Visit (INDEPENDENT_AMBULATORY_CARE_PROVIDER_SITE_OTHER): Payer: Medicare HMO | Admitting: Family Medicine

## 2021-02-28 ENCOUNTER — Other Ambulatory Visit: Payer: Self-pay

## 2021-02-28 ENCOUNTER — Encounter (HOSPITAL_COMMUNITY): Payer: Self-pay

## 2021-02-28 VITALS — BP 130/82 | HR 87 | Temp 97.4°F | Resp 18 | Ht 64.0 in | Wt 181.0 lb

## 2021-02-28 DIAGNOSIS — E118 Type 2 diabetes mellitus with unspecified complications: Secondary | ICD-10-CM | POA: Diagnosis not present

## 2021-02-28 DIAGNOSIS — I509 Heart failure, unspecified: Secondary | ICD-10-CM | POA: Diagnosis not present

## 2021-02-28 DIAGNOSIS — E785 Hyperlipidemia, unspecified: Secondary | ICD-10-CM

## 2021-02-28 DIAGNOSIS — G8929 Other chronic pain: Secondary | ICD-10-CM

## 2021-02-28 DIAGNOSIS — M4312 Spondylolisthesis, cervical region: Secondary | ICD-10-CM | POA: Diagnosis not present

## 2021-02-28 DIAGNOSIS — K760 Fatty (change of) liver, not elsewhere classified: Secondary | ICD-10-CM

## 2021-02-28 DIAGNOSIS — I10 Essential (primary) hypertension: Secondary | ICD-10-CM

## 2021-02-28 DIAGNOSIS — E039 Hypothyroidism, unspecified: Secondary | ICD-10-CM

## 2021-02-28 DIAGNOSIS — Z5181 Encounter for therapeutic drug level monitoring: Secondary | ICD-10-CM | POA: Diagnosis not present

## 2021-02-28 DIAGNOSIS — M50322 Other cervical disc degeneration at C5-C6 level: Secondary | ICD-10-CM | POA: Diagnosis not present

## 2021-02-28 DIAGNOSIS — M542 Cervicalgia: Secondary | ICD-10-CM | POA: Diagnosis not present

## 2021-02-28 DIAGNOSIS — M545 Low back pain, unspecified: Secondary | ICD-10-CM

## 2021-02-28 DIAGNOSIS — I130 Hypertensive heart and chronic kidney disease with heart failure and stage 1 through stage 4 chronic kidney disease, or unspecified chronic kidney disease: Secondary | ICD-10-CM | POA: Diagnosis not present

## 2021-02-28 DIAGNOSIS — M50222 Other cervical disc displacement at C5-C6 level: Secondary | ICD-10-CM | POA: Diagnosis not present

## 2021-02-28 DIAGNOSIS — I700 Atherosclerosis of aorta: Secondary | ICD-10-CM | POA: Diagnosis not present

## 2021-02-28 DIAGNOSIS — R0602 Shortness of breath: Secondary | ICD-10-CM

## 2021-02-28 DIAGNOSIS — R5383 Other fatigue: Secondary | ICD-10-CM | POA: Diagnosis not present

## 2021-02-28 DIAGNOSIS — N390 Urinary tract infection, site not specified: Secondary | ICD-10-CM

## 2021-02-28 LAB — LIPID PANEL
Cholesterol: 236 mg/dL — ABNORMAL HIGH (ref 0–200)
HDL: 27.3 mg/dL — ABNORMAL LOW (ref >39.00)
Total CHOL/HDL Ratio: 9
Triglycerides: 452 mg/dL — ABNORMAL HIGH (ref 0.0–149.0)

## 2021-02-28 LAB — TSH: TSH: 6.41 u[IU]/mL — ABNORMAL HIGH (ref 0.35–4.50)

## 2021-02-28 LAB — COMPREHENSIVE METABOLIC PANEL
ALT: 51 U/L — ABNORMAL HIGH (ref 0–35)
AST: 57 U/L — ABNORMAL HIGH (ref 0–37)
Albumin: 4.7 g/dL (ref 3.5–5.2)
Alkaline Phosphatase: 40 U/L (ref 39–117)
BUN: 26 mg/dL — ABNORMAL HIGH (ref 6–23)
CO2: 26 mEq/L (ref 19–32)
Calcium: 10.1 mg/dL (ref 8.4–10.5)
Chloride: 101 mEq/L (ref 96–112)
Creatinine, Ser: 0.87 mg/dL (ref 0.40–1.20)
GFR: 64.74 mL/min (ref >60.00)
Glucose, Bld: 102 mg/dL — ABNORMAL HIGH (ref 70–99)
Potassium: 4.2 mEq/L (ref 3.5–5.1)
Sodium: 139 mEq/L (ref 135–145)
Total Bilirubin: 0.5 mg/dL (ref 0.2–1.2)
Total Protein: 7.6 g/dL (ref 6.0–8.3)

## 2021-02-28 LAB — CBC
HCT: 40 % (ref 36.0–46.0)
Hemoglobin: 13.7 g/dL (ref 12.0–15.0)
MCHC: 34.3 g/dL (ref 30.0–36.0)
MCV: 85.4 fl (ref 78.0–100.0)
Platelets: 255 10*3/uL (ref 150.0–400.0)
RBC: 4.69 Mil/uL (ref 3.87–5.11)
RDW: 15.8 % — ABNORMAL HIGH (ref 11.5–15.5)
WBC: 6.8 10*3/uL (ref 4.0–10.5)

## 2021-02-28 LAB — MICROALBUMIN / CREATININE URINE RATIO
Creatinine,U: 68.7 mg/dL
Microalb Creat Ratio: 6.3 mg/g (ref 0.0–30.0)
Microalb, Ur: 4.3 mg/dL — ABNORMAL HIGH (ref 0.0–1.9)

## 2021-02-28 LAB — LDL CHOLESTEROL, DIRECT: Direct LDL: 129 mg/dL

## 2021-02-28 LAB — HEMOGLOBIN A1C: Hgb A1c MFr Bld: 8 % — ABNORMAL HIGH (ref 4.6–6.5)

## 2021-02-28 LAB — VITAMIN D 25 HYDROXY (VIT D DEFICIENCY, FRACTURES): VITD: 51.89 ng/mL (ref 30.00–100.00)

## 2021-02-28 IMAGING — DX DG CERVICAL SPINE COMPLETE 4+V
7 series · 7 of 7 positions shown · non-contrast
Comparison: None.

CLINICAL DATA: Cervical disc herniations.

EXAM:
CERVICAL SPINE - COMPLETE 4+ VIEW

[c-spine lat]
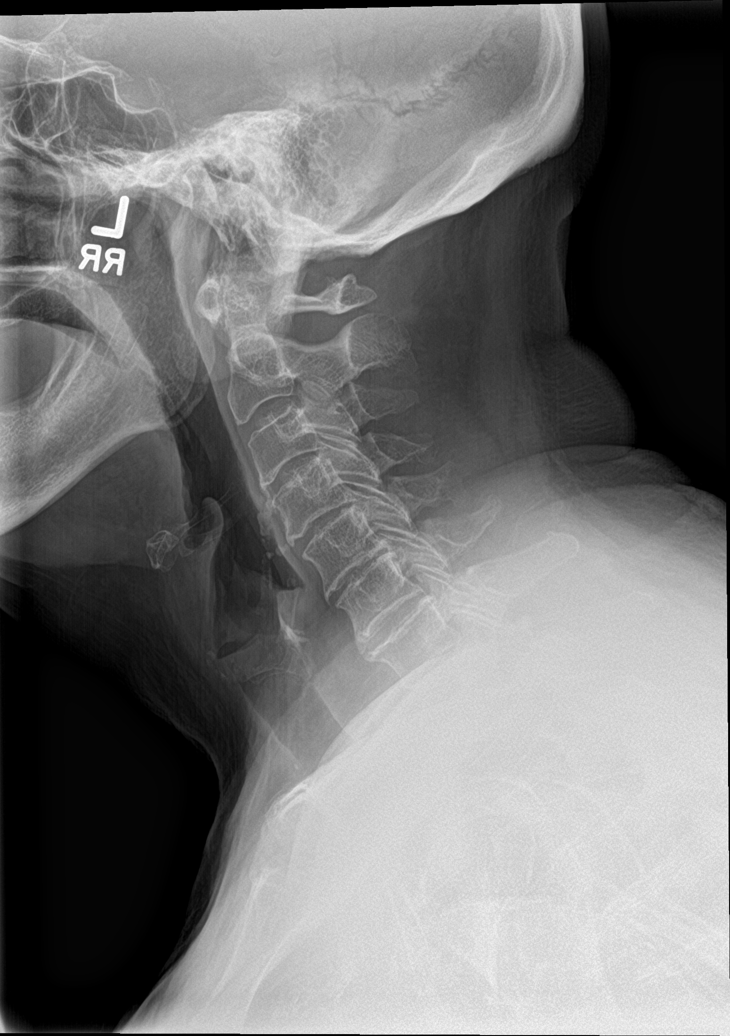

[c-spine obl (1 of 2)]
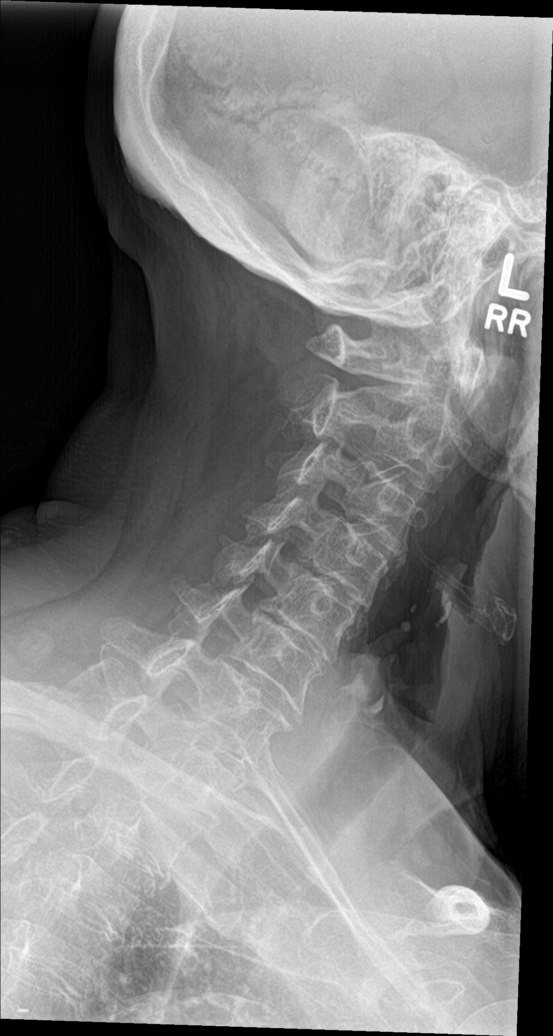

[c-spine obl (2 of 2)]
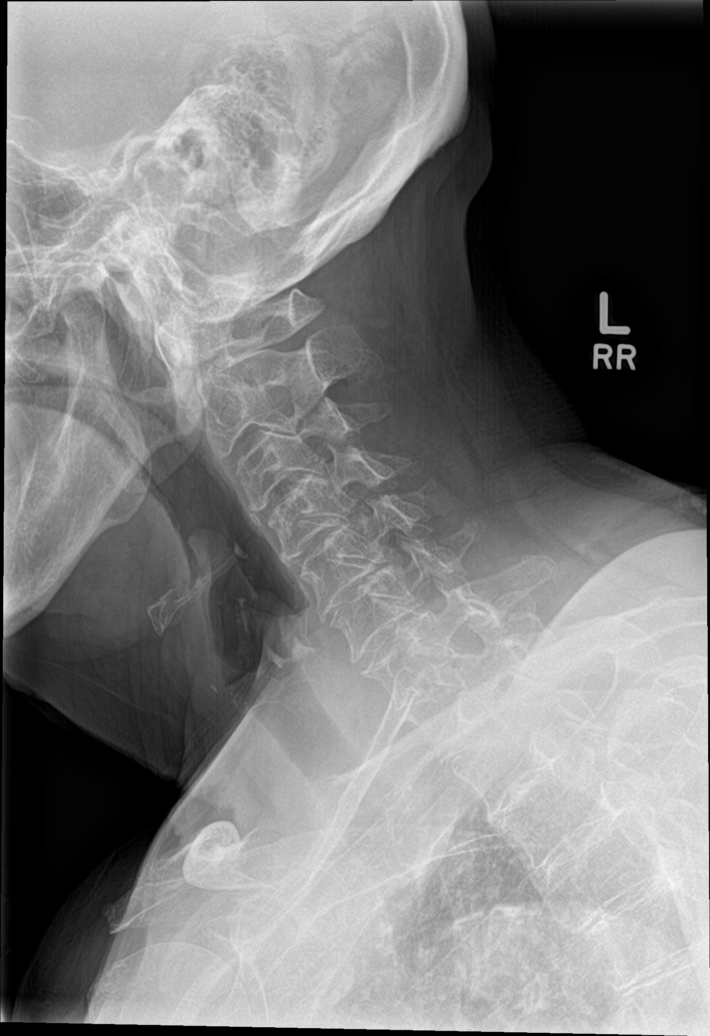

[c-spine ap]
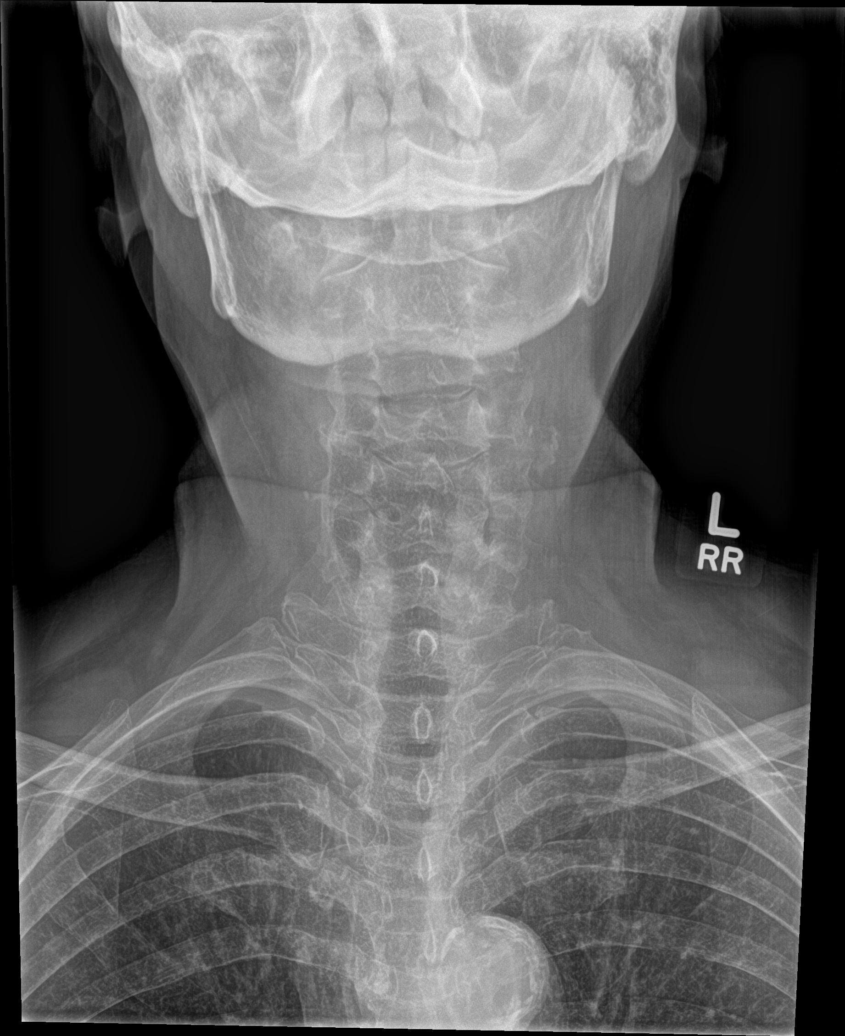

[c-spine open mouth]
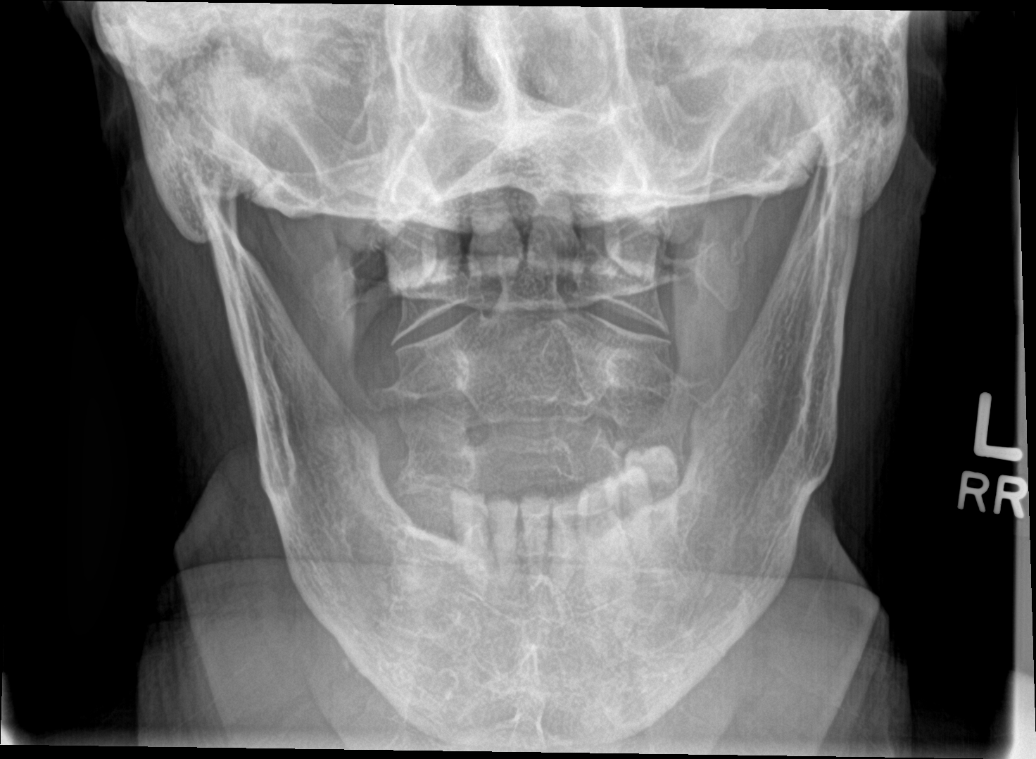

[c-spine swimmers]
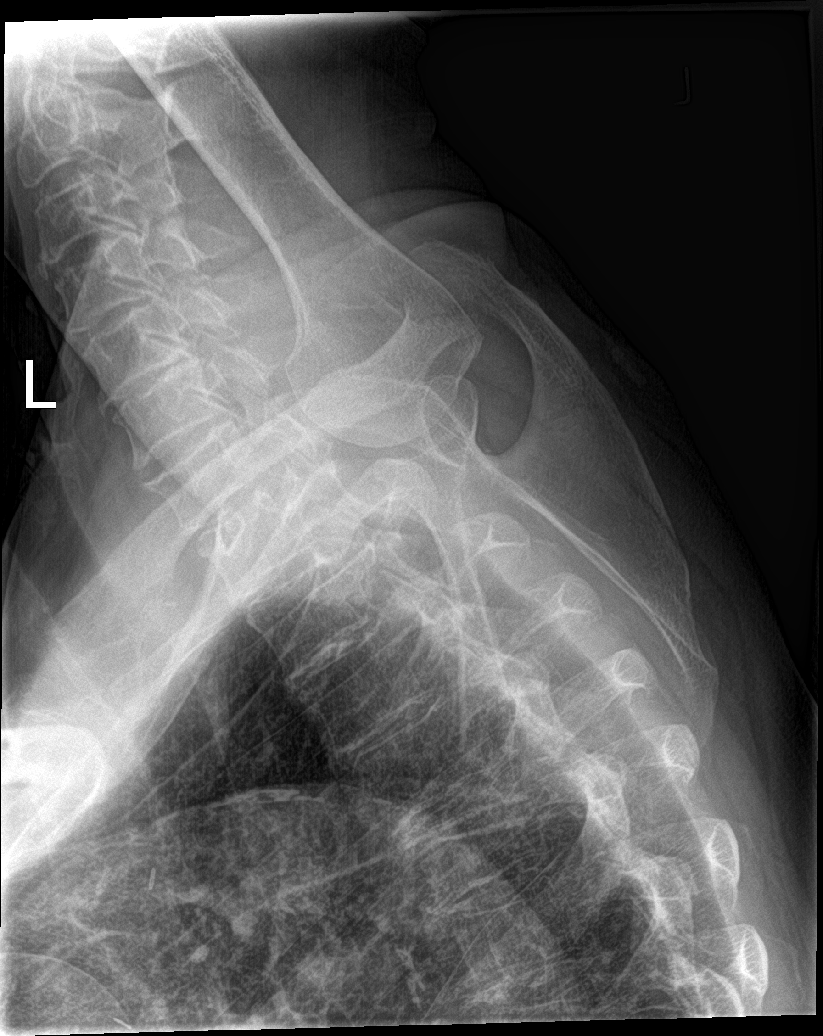

[[person_name]]
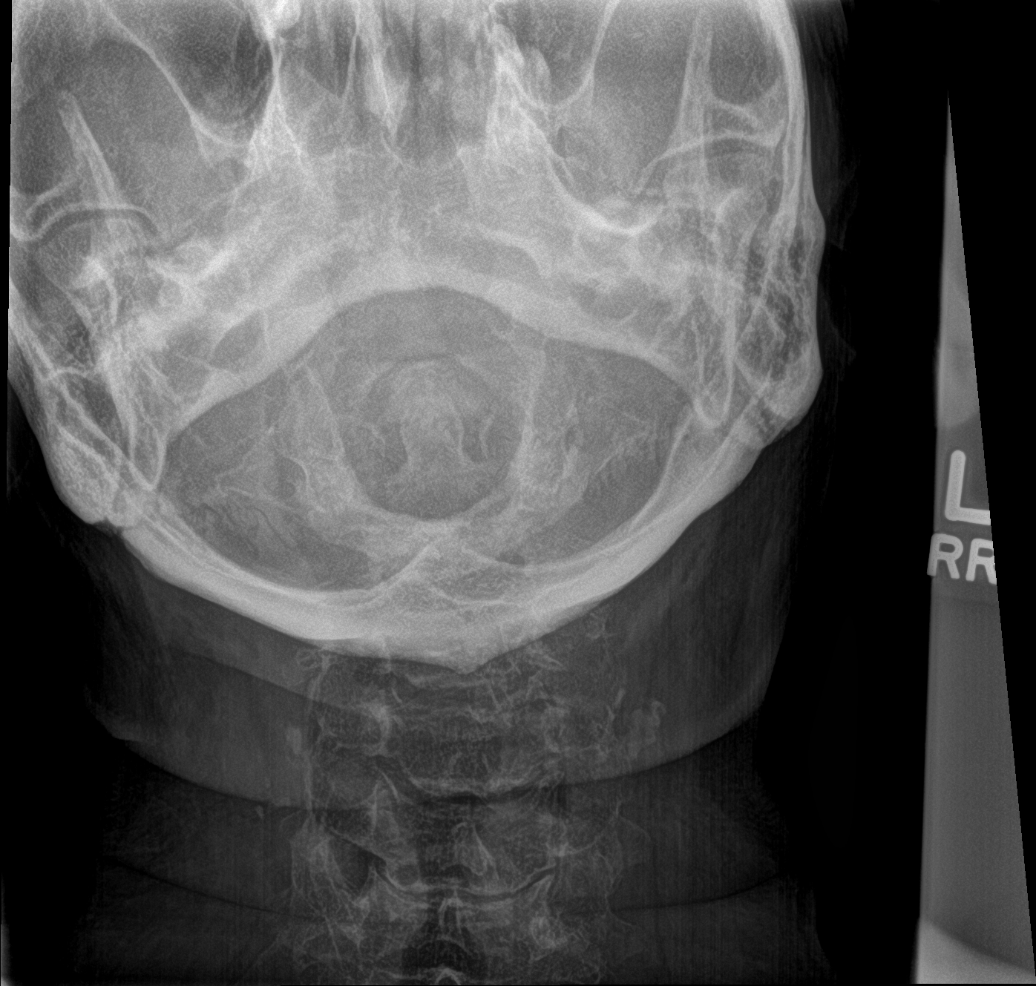

[7 of 7 positions shown; findings below may reference images not displayed]

FINDINGS: Prevertebral soft tissues are within normal limits. Slight
degenerative anterolisthesis is present at C6-7. Straightening of
the normal cervical lordosis is noted.

Uncovertebral spurring contributes to foraminal narrowing
bilaterally at C5-6 and C6-7, left greater than right. Vertebral
body heights are maintained. Lung apices are clear. Atherosclerotic
calcifications are noted at the aortic arch.
IMPRESSION: 1. Uncovertebral spurring contributes to bilateral foraminal
narrowing at C5-6 and C6-7, left greater than right.
2. Straightening of the normal cervical lordosis.
3. No acute abnormality.

## 2021-02-28 IMAGING — DX DG LUMBAR SPINE COMPLETE 4+V
5 series · 5 of 5 positions shown · non-contrast
Comparison: None.

CLINICAL DATA: Low back pain.  Bilateral leg weakness.

EXAM:
LUMBAR SPINE - COMPLETE 4+ VIEW

[l-spine ap]
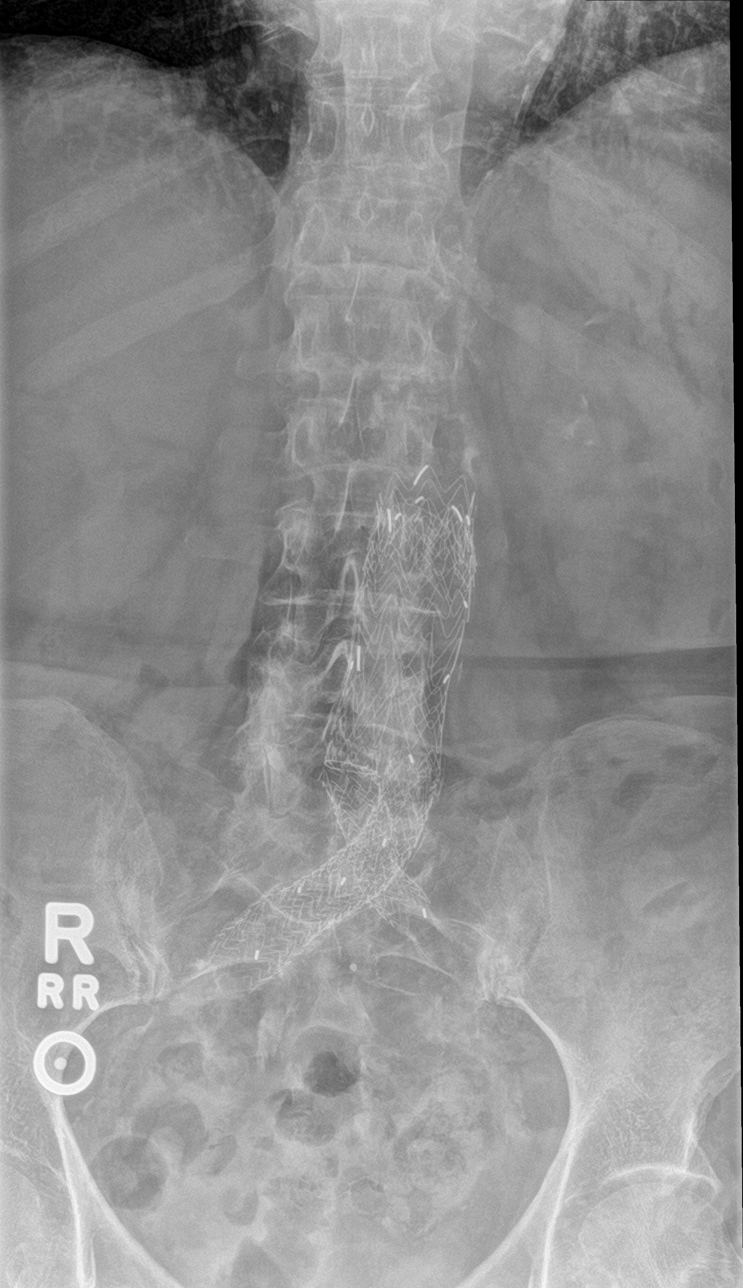

[l-spine obl (1 of 2)]
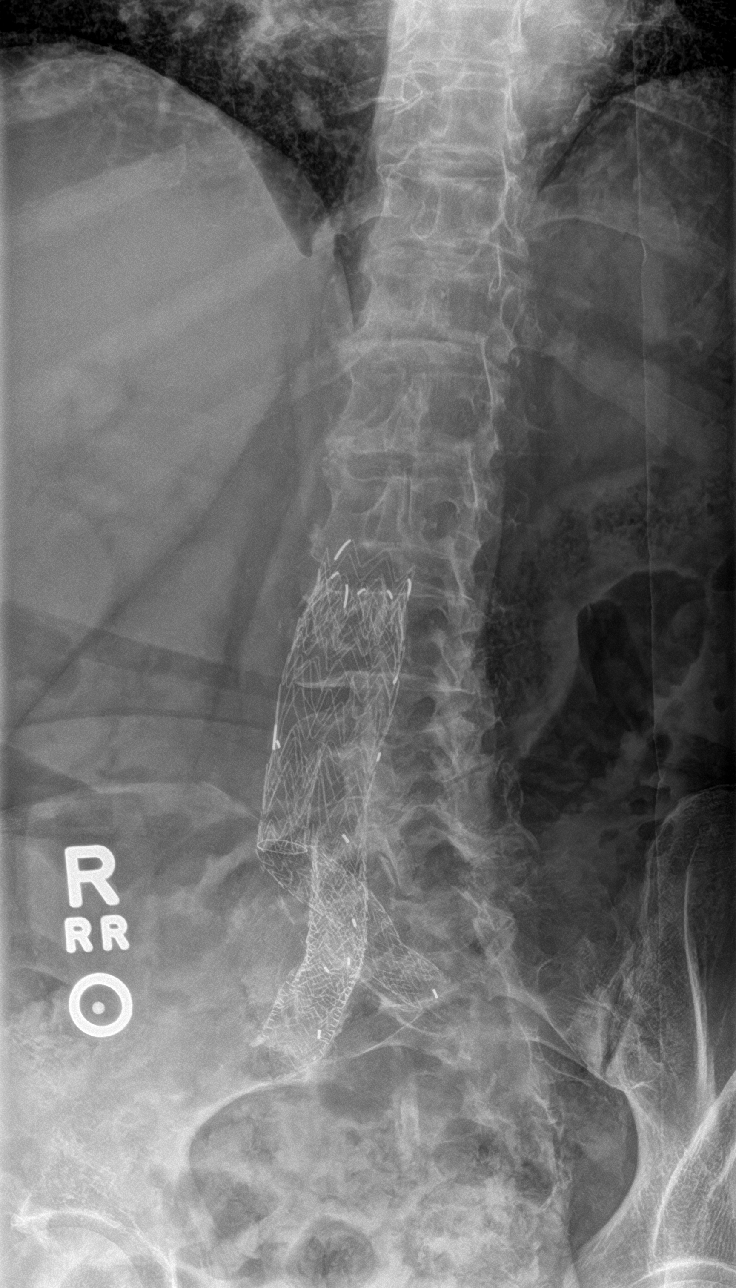

[l-spine obl (2 of 2)]
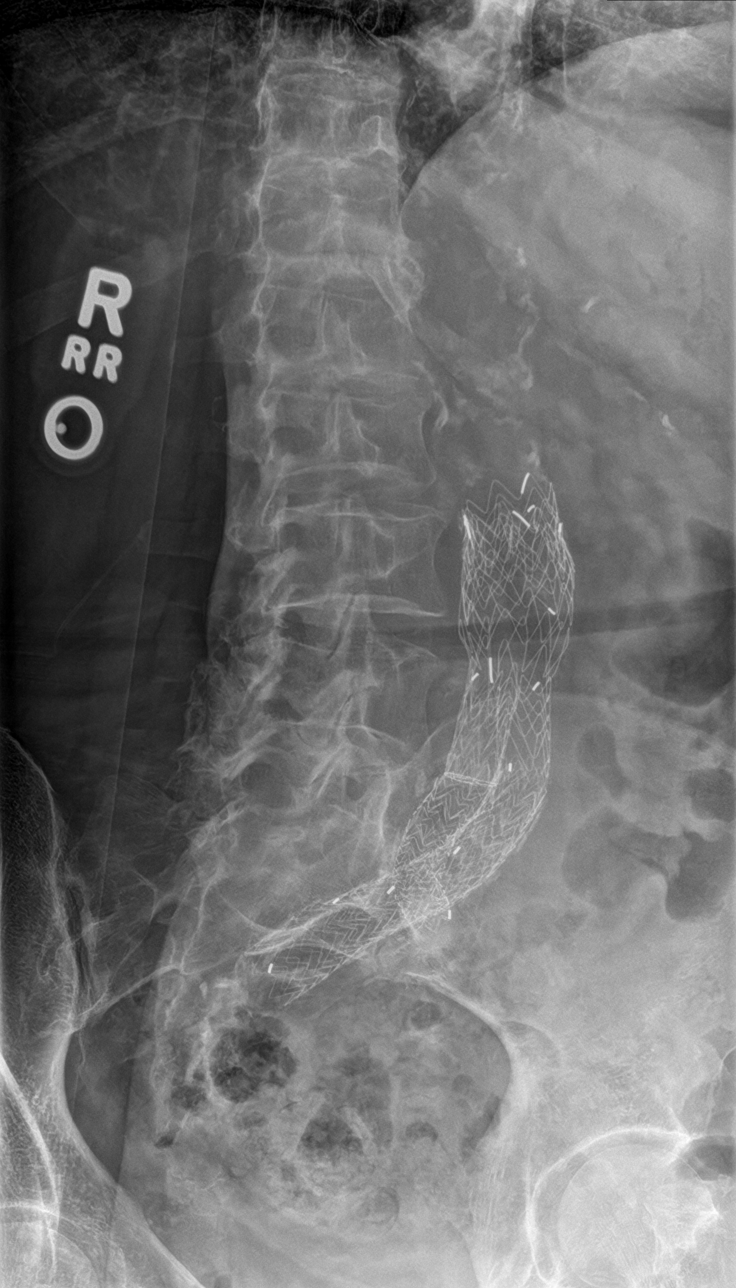

[l-spine lat]
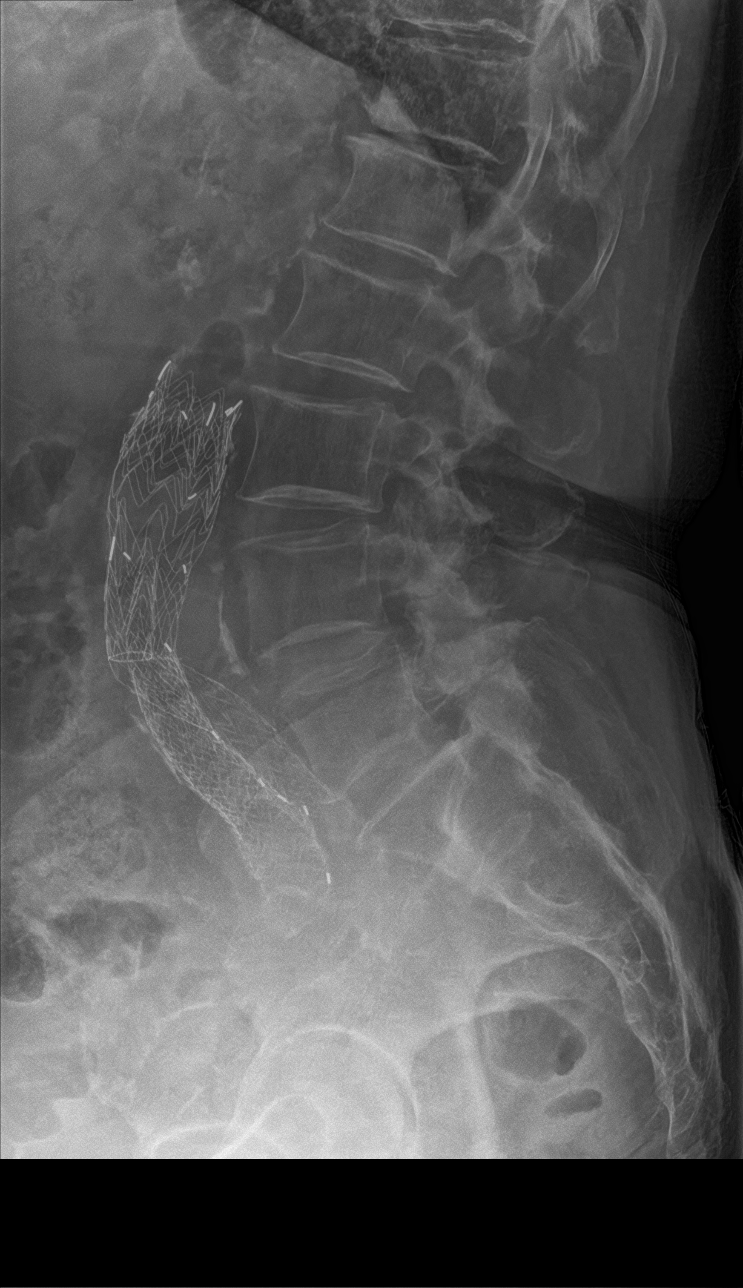

[l-spine spot]
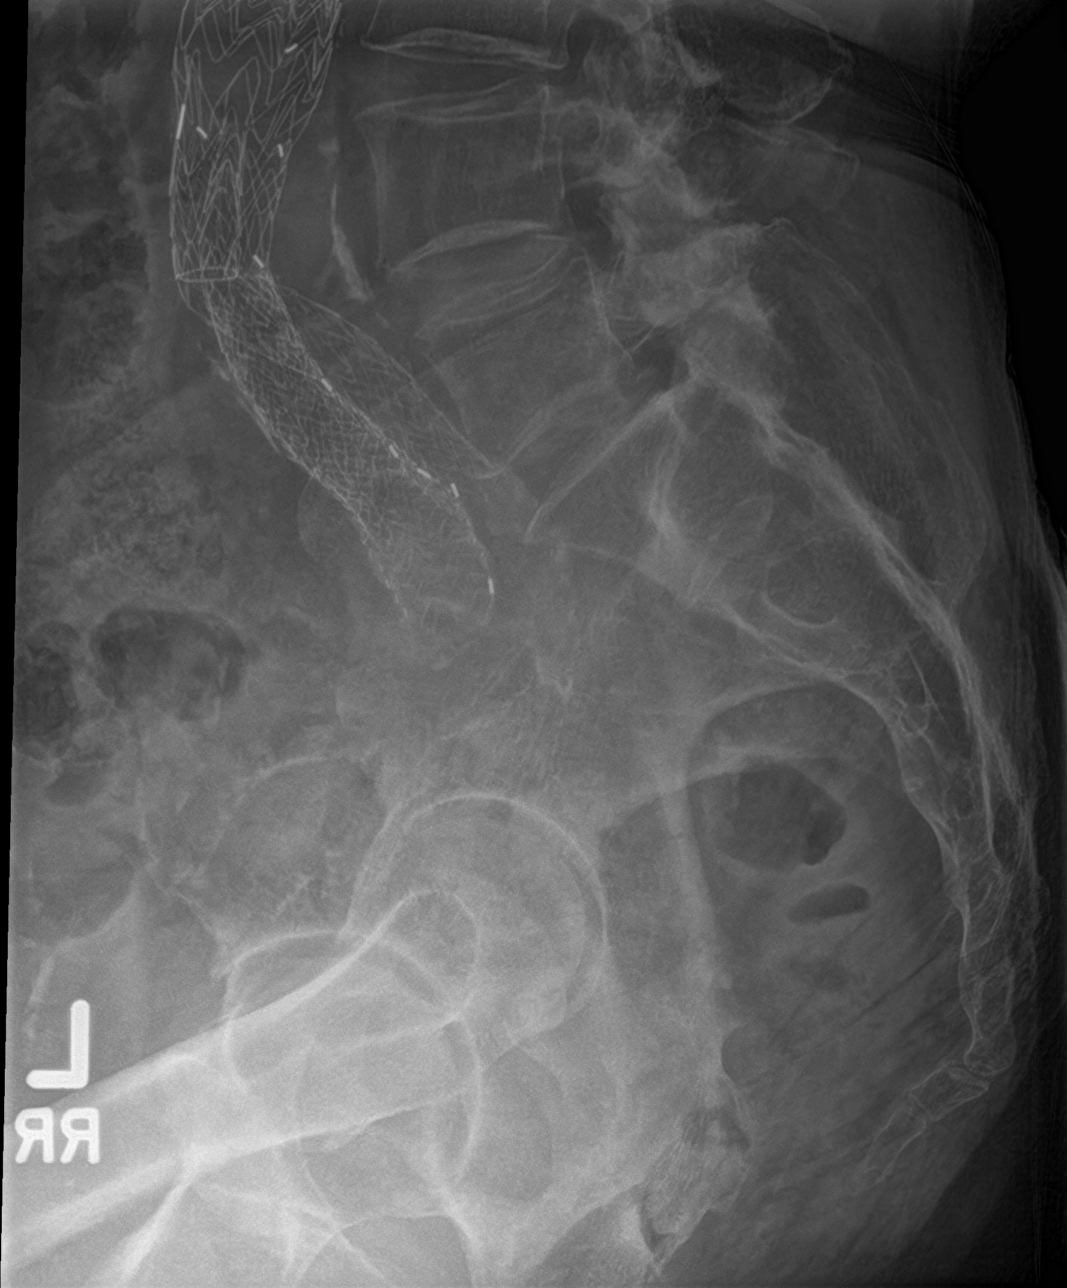

[5 of 5 positions shown; findings below may reference images not displayed]

FINDINGS: Aortobifemoral stent graft is in place. 5 non rib-bearing lumbar
type vertebral bodies are present. No significant listhesis is
present. Lumbar lordosis is preserved. Mild leftward curvature is
present in the lumbar spine. Bilateral facet degenerative changes
are evident at L4-5 and L5-S1. Vascular calcifications present.
Bowel gas pattern unremarkable.
IMPRESSION: 1. Aortobifemoral stent graft.
2. Mild leftward curvature of the lumbar spine.
3. Bilateral facet degenerative changes at L4-5 and L5-S1.

## 2021-02-28 MED ORDER — CEPHALEXIN 250 MG PO CAPS: 250.0000 mg | ORAL_CAPSULE | Freq: Every day | ORAL | 0 refills | Status: DC

## 2021-03-02 ENCOUNTER — Other Ambulatory Visit: Payer: Self-pay | Admitting: Family Medicine

## 2021-03-02 ENCOUNTER — Telehealth: Payer: Self-pay | Admitting: Family Medicine

## 2021-03-02 DIAGNOSIS — E118 Type 2 diabetes mellitus with unspecified complications: Secondary | ICD-10-CM

## 2021-03-02 DIAGNOSIS — K219 Gastro-esophageal reflux disease without esophagitis: Secondary | ICD-10-CM

## 2021-03-02 DIAGNOSIS — M5416 Radiculopathy, lumbar region: Secondary | ICD-10-CM

## 2021-03-02 MED ORDER — LEVOTHYROXINE SODIUM 50 MCG PO TABS: 50.0000 ug | ORAL_TABLET | Freq: Every day | ORAL | 3 refills | Status: DC

## 2021-03-02 NOTE — Telephone Encounter (Signed)
Patient is requesting CT scan. She was seen yesterday and xray was performed.

## 2021-03-02 NOTE — Telephone Encounter (Signed)
Caller; Kathy  Call Back @ [phone]  Patient would like Dr Lorelei Pont to set her up for a CT Scan

## 2021-03-02 NOTE — Addendum Note (Signed)
Addended by: Lamar Blinks C on: 03/02/2021 12:47 PM   Modules accepted: Orders

## 2021-03-03 NOTE — Telephone Encounter (Signed)
Called her to clarify- she would like a lumbar MRI

## 2021-03-09 DIAGNOSIS — L821 Other seborrheic keratosis: Secondary | ICD-10-CM | POA: Diagnosis not present

## 2021-03-09 DIAGNOSIS — X32XXXS Exposure to sunlight, sequela: Secondary | ICD-10-CM | POA: Diagnosis not present

## 2021-03-09 DIAGNOSIS — L820 Inflamed seborrheic keratosis: Secondary | ICD-10-CM | POA: Diagnosis not present

## 2021-03-09 DIAGNOSIS — D692 Other nonthrombocytopenic purpura: Secondary | ICD-10-CM | POA: Diagnosis not present

## 2021-03-09 DIAGNOSIS — D1801 Hemangioma of skin and subcutaneous tissue: Secondary | ICD-10-CM | POA: Diagnosis not present

## 2021-03-09 DIAGNOSIS — L814 Other melanin hyperpigmentation: Secondary | ICD-10-CM | POA: Diagnosis not present

## 2021-03-09 DIAGNOSIS — Z85828 Personal history of other malignant neoplasm of skin: Secondary | ICD-10-CM | POA: Diagnosis not present

## 2021-03-18 ENCOUNTER — Other Ambulatory Visit: Payer: Self-pay | Admitting: Family Medicine

## 2021-03-22 ENCOUNTER — Ambulatory Visit
Admission: RE | Admit: 2021-03-22 | Discharge: 2021-03-22 | Disposition: A | Discharge status: Home / Self Care | Payer: Medicare HMO | Source: Ambulatory Visit | Attending: Family Medicine | Admitting: Family Medicine

## 2021-03-22 DIAGNOSIS — M5416 Radiculopathy, lumbar region: Secondary | ICD-10-CM

## 2021-03-22 DIAGNOSIS — M48061 Spinal stenosis, lumbar region without neurogenic claudication: Secondary | ICD-10-CM | POA: Diagnosis not present

## 2021-03-22 DIAGNOSIS — M545 Low back pain, unspecified: Secondary | ICD-10-CM | POA: Diagnosis not present

## 2021-03-22 IMAGING — MR MR LUMBAR SPINE W/O CM
4 of 5 series · 28 of 48 positions shown · non-contrast
Comparison: Lumbar spine radiographs [DATE]

CLINICAL DATA: Lumbar radiculopathy.  Pain and weakness both legs.

EXAM:
MRI LUMBAR SPINE WITHOUT CONTRAST
TECHNIQUE: Multiplanar, multisequence MR imaging of the lumbar spine was
performed. No intravenous contrast was administered.

[Series 3: T2 · sagittal · 4.0mm · 1.09mm/px · 6 of 17 slices shown (1 of 2)]
[im 1/17]
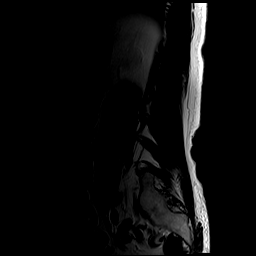
[im 4/17]
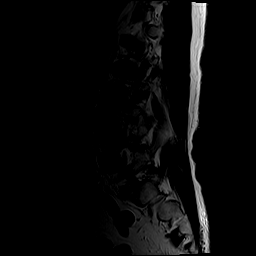
[im 7/17]
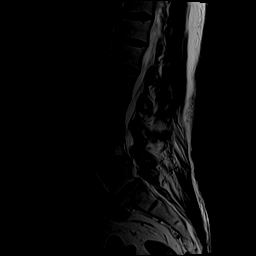
[im 10/17]
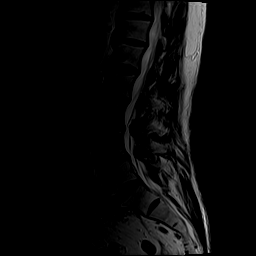
[im 13/17]
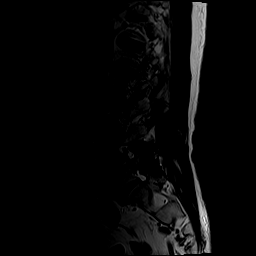
[im 17/17]
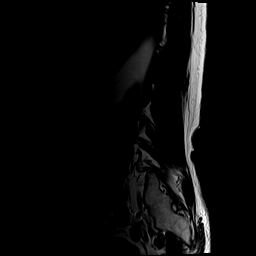

[Series 5: T1 · sagittal · 4.0mm · 1.09mm/px · 7 of 17 slices shown (1 of 2)]
[im 1/17]
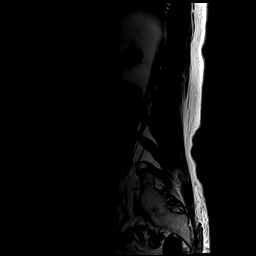
[im 3/17]
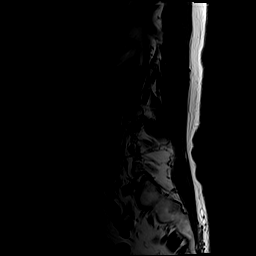
[im 6/17]
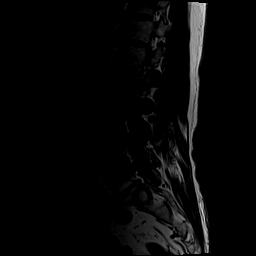
[im 9/17]
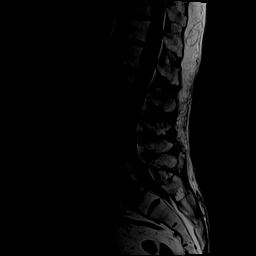
[im 11/17]
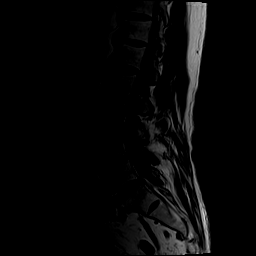
[im 14/17]
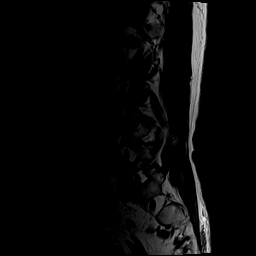
[im 17/17]
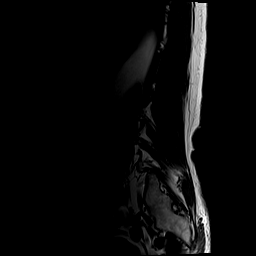

[Series 6: T2 · axial · 4.0mm · 0.39mm/px · z∈[-82,+141]mm · 8 of 36 slices shown (2 of 2)]
[im 1/36]
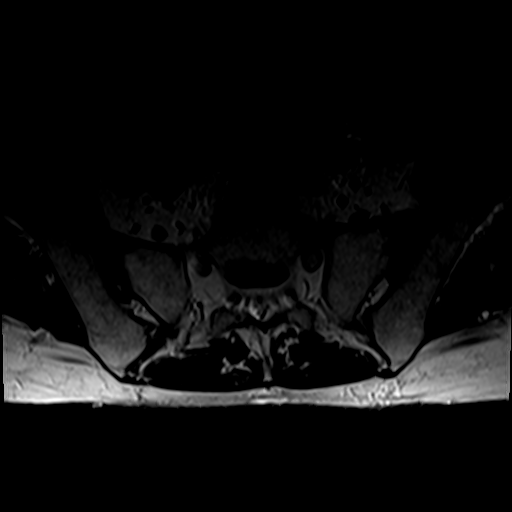
[im 6/36]
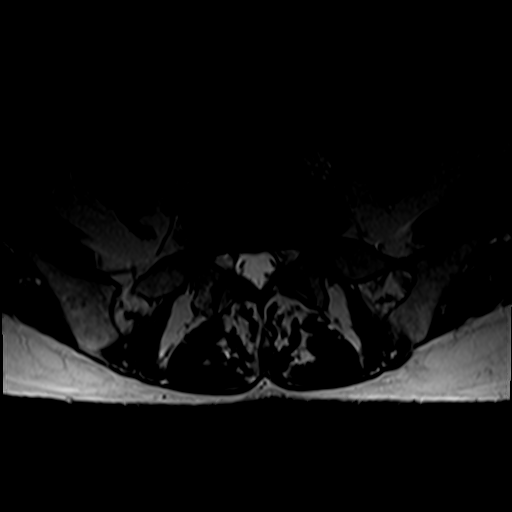
[im 11/36]
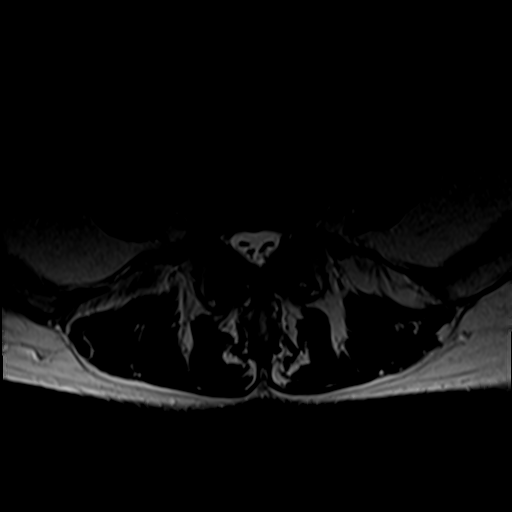
[im 17/36]
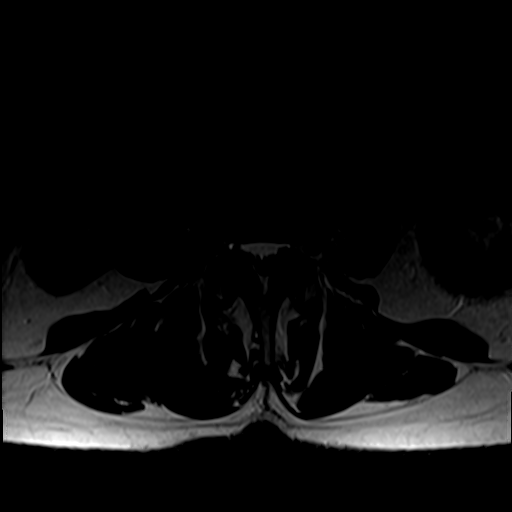
[im 19/36]
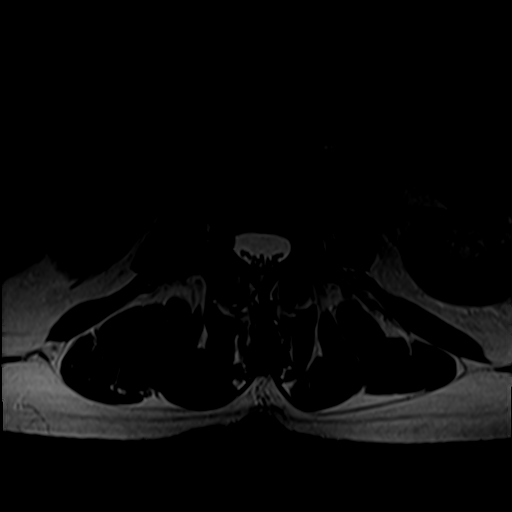
[im 25/36]
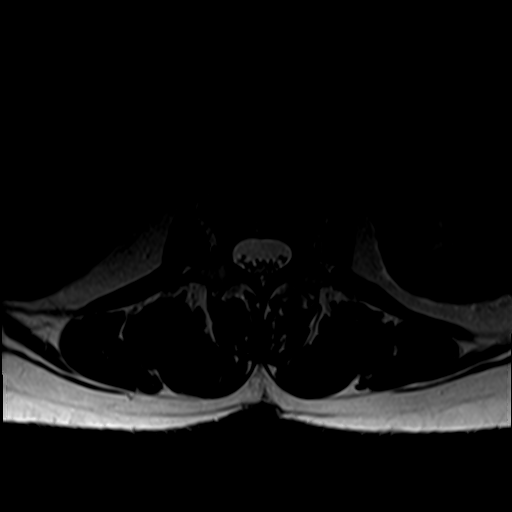
[im 30/36]
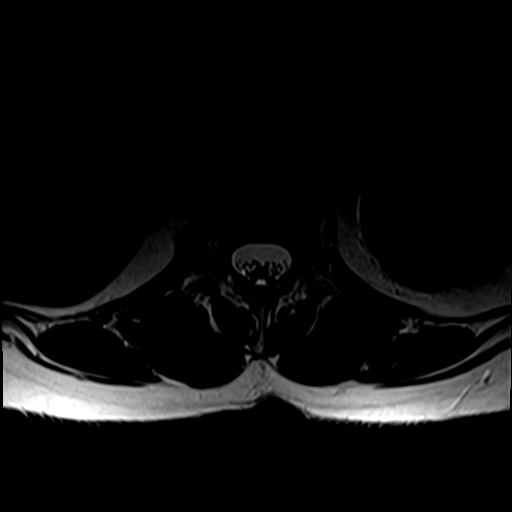
[im 36/36]
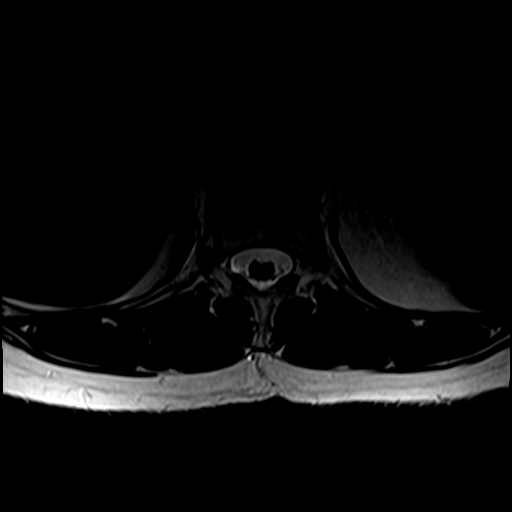

[Series 7: T1 · axial · 4.0mm · 0.39mm/px · z∈[-82,+113]mm · 7 of 36 slices shown (2 of 2)]
[im 1/36]
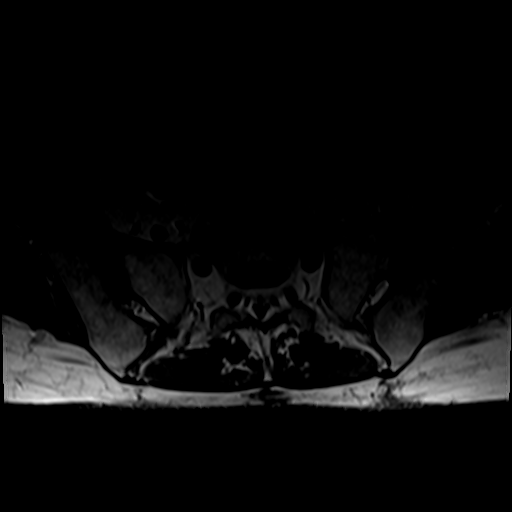
[im 6/36]
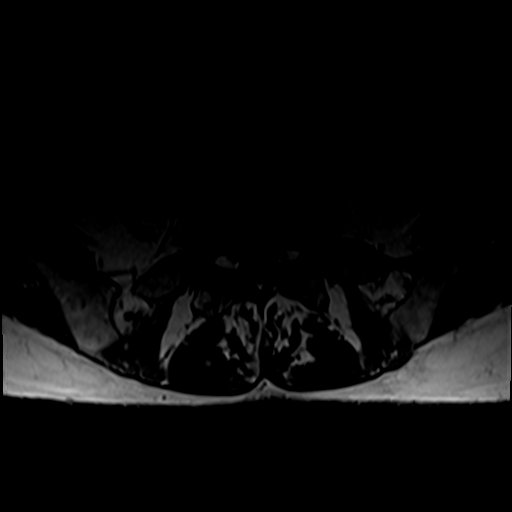
[im 11/36]
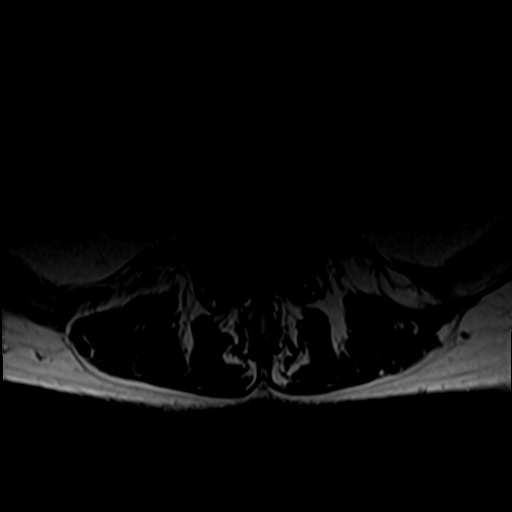
[im 17/36]
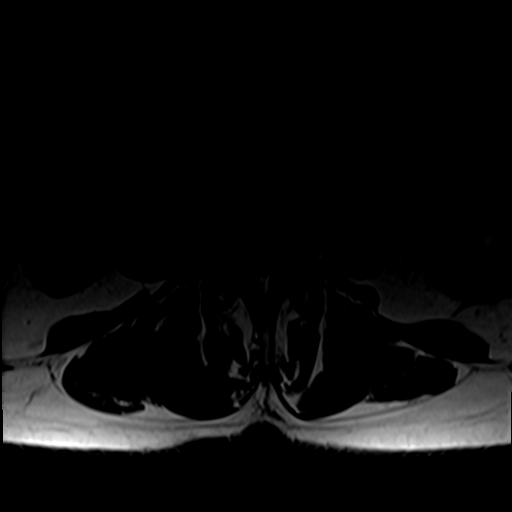
[im 19/36]
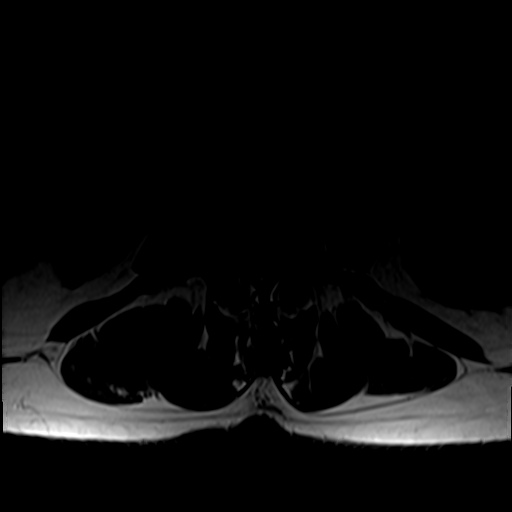
[im 25/36]
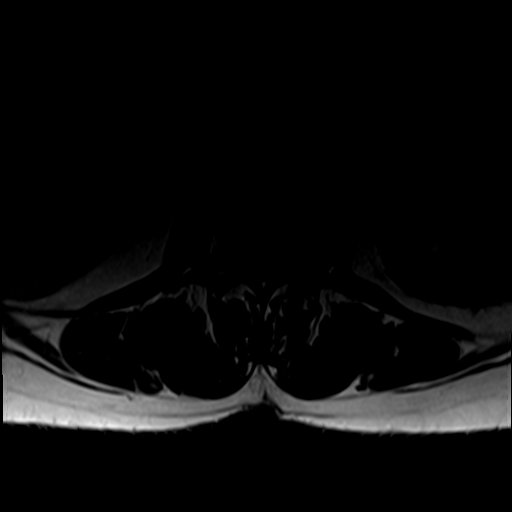
[im 30/36]
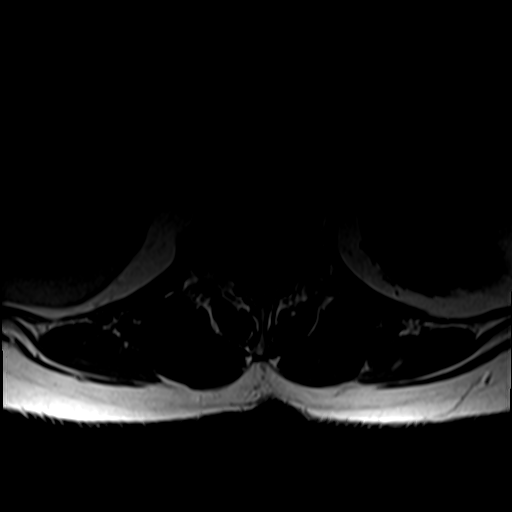

[28 of 48 positions shown; findings below may reference images not displayed]

FINDINGS: Segmentation:  Normal

Alignment:  Mild retrolisthesis L2-3.  Remaining alignment normal.

Vertebrae: Negative for fracture or mass. Hemangioma L4 vertebral
body.

Conus medullaris and cauda equina: Conus extends to the L1-2 level.
Conus and cauda equina appear normal.

Paraspinal and other soft tissues: Negative for paraspinous mass or
adenopathy. Stent graft in the aorta and iliac arteries.

Disc levels:

L1-2: Mild disc degeneration.  Negative for stenosis

L2-3: Mild retrolisthesis. Mild disc and facet degeneration.
Negative for stenosis.

L3-4: Mild disc bulging and moderate facet degeneration. Mild
subarticular stenosis bilaterally.

L4-5: Mild disc bulging and moderate facet degeneration. Mild
subarticular stenosis bilaterally. Spinal canal adequate in size.

L5-S1: Bilateral facet degeneration. Mild left subarticular and
foraminal stenosis.
IMPRESSION: Multilevel degenerative change in the lumbar spine. Mild
subarticular stenosis bilaterally L3-4 and L4-5. Mild left
subarticular stenosis and foraminal stenosis L5-S1.

## 2021-03-23 ENCOUNTER — Other Ambulatory Visit: Payer: Medicare HMO

## 2021-03-24 ENCOUNTER — Telehealth: Payer: Self-pay

## 2021-03-24 DIAGNOSIS — M5416 Radiculopathy, lumbar region: Secondary | ICD-10-CM

## 2021-03-24 NOTE — Telephone Encounter (Signed)
Call patient to go over her recent MRI report.  She shows bulging disks and some stenosis.  I do not think surgery will be recommended, but epidural steroid injection might be helpful.  I will refer her to neurosurgery for further evaluation  MRI 03/22/21  IMPRESSION: Multilevel degenerative change in the lumbar spine. Mild subarticular stenosis bilaterally L3-4 and L4-5. Mild left subarticular stenosis and foraminal stenosis L5-S1.

## 2021-03-24 NOTE — Telephone Encounter (Signed)
Caller states she missed a call from the office.  Telephone: [phone]

## 2021-03-25 NOTE — Telephone Encounter (Signed)
Patient notified of results and verbalized understanding. I called to make sure she was aware of results-she states Dr. Lorelei Pont actually called her and discussed.

## 2021-03-31 DIAGNOSIS — M609 Myositis, unspecified: Secondary | ICD-10-CM | POA: Diagnosis not present

## 2021-03-31 DIAGNOSIS — I739 Peripheral vascular disease, unspecified: Secondary | ICD-10-CM | POA: Diagnosis not present

## 2021-04-06 ENCOUNTER — Ambulatory Visit (HOSPITAL_COMMUNITY)
Admission: RE | Admit: 2021-04-06 | Discharge: 2021-04-06 | Disposition: A | Discharge status: Home / Self Care | Payer: Medicare HMO | Source: Ambulatory Visit | Attending: Neurological Surgery | Admitting: Neurological Surgery

## 2021-04-06 ENCOUNTER — Other Ambulatory Visit (HOSPITAL_COMMUNITY): Payer: Self-pay | Admitting: Neurological Surgery

## 2021-04-06 ENCOUNTER — Other Ambulatory Visit: Payer: Self-pay

## 2021-04-06 DIAGNOSIS — I739 Peripheral vascular disease, unspecified: Secondary | ICD-10-CM | POA: Insufficient documentation

## 2021-04-11 ENCOUNTER — Other Ambulatory Visit: Payer: Self-pay | Admitting: Family Medicine

## 2021-04-11 DIAGNOSIS — E118 Type 2 diabetes mellitus with unspecified complications: Secondary | ICD-10-CM

## 2021-05-01 ENCOUNTER — Other Ambulatory Visit: Payer: Self-pay | Admitting: Family Medicine

## 2021-05-01 DIAGNOSIS — Z9889 Other specified postprocedural states: Secondary | ICD-10-CM

## 2021-05-02 ENCOUNTER — Other Ambulatory Visit: Payer: Self-pay | Admitting: Family Medicine

## 2021-05-02 DIAGNOSIS — E1142 Type 2 diabetes mellitus with diabetic polyneuropathy: Secondary | ICD-10-CM

## 2021-05-03 ENCOUNTER — Telehealth: Payer: Self-pay | Admitting: Family Medicine

## 2021-05-03 NOTE — Telephone Encounter (Signed)
Patient due for lab visit to recheck tsh per last result note from pcp. She was last here in March. Needs two month lab recheck only. Lab order have been placed as future.

## 2021-05-03 NOTE — Telephone Encounter (Signed)
Patient would like to know when her next tsh lab supposed to be

## 2021-05-06 ENCOUNTER — Other Ambulatory Visit (INDEPENDENT_AMBULATORY_CARE_PROVIDER_SITE_OTHER): Payer: Medicare HMO

## 2021-05-06 ENCOUNTER — Other Ambulatory Visit: Payer: Self-pay

## 2021-05-06 DIAGNOSIS — E039 Hypothyroidism, unspecified: Secondary | ICD-10-CM

## 2021-05-06 LAB — TSH: TSH: 4.01 mIU/L (ref 0.40–4.50)

## 2021-05-09 ENCOUNTER — Telehealth: Payer: Self-pay | Admitting: Family Medicine

## 2021-05-09 ENCOUNTER — Encounter: Payer: Self-pay | Admitting: Family Medicine

## 2021-05-09 NOTE — Telephone Encounter (Signed)
[employer] is checking on fax requesting [facility] - [place], [address]  [place], [place] [address]  Phone:  [phone] Fax:  [phone]

## 2021-05-11 MED ORDER — ACCU-CHEK AVIVA PLUS W/DEVICE KIT: PACK | 0 refills | Status: AC

## 2021-05-11 NOTE — Telephone Encounter (Signed)
Meter sent to pharmacy. Never received fax from [facility]

## 2021-05-24 ENCOUNTER — Telehealth: Payer: Self-pay

## 2021-05-24 NOTE — Telephone Encounter (Signed)
Pt called stating she needs a refill on Accu-Chek Aviva Plus test strips, NOT on Accu-Chek Guide test strips.  Per pt, [facility] pharmacy still has the Accu-Chek Aviva Plus test strips currently and she wants to continue using that meter until she absolutely has to phase over to the other one.  Please send into [facility] for pt.

## 2021-05-25 MED ORDER — ACCU-CHEK AVIVA PLUS VI STRP: ORAL_STRIP | 12 refills | Status: DC

## 2021-05-25 NOTE — Telephone Encounter (Signed)
Test strips sent to [facility].

## 2021-06-12 ENCOUNTER — Other Ambulatory Visit: Payer: Self-pay | Admitting: Family Medicine

## 2021-06-12 DIAGNOSIS — E785 Hyperlipidemia, unspecified: Secondary | ICD-10-CM

## 2021-06-20 ENCOUNTER — Other Ambulatory Visit: Payer: Self-pay | Admitting: Family Medicine

## 2021-06-20 DIAGNOSIS — Z5181 Encounter for therapeutic drug level monitoring: Secondary | ICD-10-CM

## 2021-06-30 DIAGNOSIS — R8271 Bacteriuria: Secondary | ICD-10-CM | POA: Diagnosis not present

## 2021-06-30 DIAGNOSIS — N139 Obstructive and reflux uropathy, unspecified: Secondary | ICD-10-CM | POA: Diagnosis not present

## 2021-06-30 DIAGNOSIS — N3020 Other chronic cystitis without hematuria: Secondary | ICD-10-CM | POA: Diagnosis not present

## 2021-07-07 DIAGNOSIS — N3020 Other chronic cystitis without hematuria: Secondary | ICD-10-CM | POA: Diagnosis not present

## 2021-07-07 DIAGNOSIS — N3000 Acute cystitis without hematuria: Secondary | ICD-10-CM | POA: Diagnosis not present

## 2021-07-20 ENCOUNTER — Other Ambulatory Visit: Payer: Self-pay | Admitting: Family Medicine

## 2021-07-20 DIAGNOSIS — E118 Type 2 diabetes mellitus with unspecified complications: Secondary | ICD-10-CM

## 2021-07-20 DIAGNOSIS — K219 Gastro-esophageal reflux disease without esophagitis: Secondary | ICD-10-CM

## 2021-07-25 ENCOUNTER — Telehealth: Payer: Self-pay | Admitting: Family Medicine

## 2021-07-25 DIAGNOSIS — K219 Gastro-esophageal reflux disease without esophagitis: Secondary | ICD-10-CM

## 2021-07-25 DIAGNOSIS — E118 Type 2 diabetes mellitus with unspecified complications: Secondary | ICD-10-CM

## 2021-07-25 MED ORDER — METFORMIN HCL 1000 MG PO TABS: 1000.0000 mg | ORAL_TABLET | Freq: Two times a day (BID) | ORAL | 1 refills | Status: DC

## 2021-07-25 MED ORDER — PANTOPRAZOLE SODIUM 40 MG PO TBEC: 40.0000 mg | DELAYED_RELEASE_TABLET | Freq: Every day | ORAL | 1 refills | Status: DC

## 2021-07-25 NOTE — Telephone Encounter (Signed)
Medication sent to pharmacy  

## 2021-07-25 NOTE — Telephone Encounter (Signed)
Medication: metFORMIN (GLUCOPHAGE) 1000 MG tablet [id number]  and pantoprazole (PROTONIX) 40 MG tablet [id number] Has the patient contacted their pharmacy? Yes.   (If no, request that the patient contact the pharmacy for the refill.) (If yes, when and what did the pharmacy advise?) Pharmacy never received the request for the medication   Preferred Pharmacy (with phone number or street name): [facility] ([facility] - [place], [address]  [place], [place] [address]  Phone:  [phone]   Agent: Please be advised that RX refills may take up to 3 business days. We ask that you follow-up with your pharmacy.

## 2021-08-18 DIAGNOSIS — D49511 Neoplasm of unspecified behavior of right kidney: Secondary | ICD-10-CM | POA: Diagnosis not present

## 2021-08-18 DIAGNOSIS — N3020 Other chronic cystitis without hematuria: Secondary | ICD-10-CM | POA: Diagnosis not present

## 2021-08-18 DIAGNOSIS — R1084 Generalized abdominal pain: Secondary | ICD-10-CM | POA: Diagnosis not present

## 2021-08-28 NOTE — Progress Notes (Addendum)
[facility], [place], [place] [address] [phone]  Date:  08/31/2021   Name:  Mandy Kim   DOB:  Jan 15, 1946   MRN:  [id number]  PCP:  Darreld Mclean, MD    Chief Complaint: 6 month follow up (Concerns/ questions: none/Flu shot today: decline/)   History of Present Illness:  Mandy Kim is a 75 y.o. very pleasant female patient who presents with the following:  Mandy Kim is here today for 23-monthfollow-up visit Most recently seen by myself in March of this year History of poorly controlled diabetes, fatty liver, AAA status post repair 2013, hyperlipidemia, cervical cancer status post hysterectomy year 2000, bilateral breast cancer status post double mastectomy She did a treadmill test in 2020, reassuring  She has refused COVID-19 vaccination Eye exam, foot exam up-to-date Flu shot- she declines today   Lab Results  Component Value Date   HGBA1C 8.0 (H) 02/28/2021   She has been unable to tolerate statins, her most recent lipids were not favorable  She got some care from [facility] recently and notes that she is feeling better, her chronic back pain is improved -it seems that it was due to a urinary tract infection?  In any case she is feeling much better in this regard  She notes that her fasting glucose is about 200 recently She is using 60 units of Tresiba  Plavix 75 Gabapentin 100 twice daily Gemfibrozil 600 twice daily Niaspan Fish oil tablets Glipizide 5 twice daily Metformin 1000 twice daily Tresiba pen, 60 units daily Levothyroxine 50 Potassium 20 daily  She no longer wishes to have colonoscopy- she was due in 2018   Patient Active Problem List   Diagnosis Date Noted   Myositis, unspecified 04/27/2020   Prolapse of female pelvic organs 05/24/2019   Cystocele with prolapse 05/23/2019   Hypoxia    Sepsis ([facility] 05/28/2017   Elevated LFTs 05/28/2017   Acute lower  UTI 05/28/2017   Dehydration 05/28/2017   Peripheral neuropathy 07/27/2014   AAA (abdominal aortic aneurysm) ([facility] 02/12/2012   Fatty liver 02/12/2012   Breast cancer ([facility] 02/12/2012   Cervical cancer ([facility] 02/12/2012   Diabetes mellitus ([facility] [place] 02/12/2012   Hyperlipidemia 02/12/2012    Past Medical History:  Diagnosis Date   Anticoagulant long-term use    plavix   Cancer ([facility]    CHF (congestive heart failure) ([facility] 2000   Chronic cystitis    Chronic kidney disease    Diverticulitis    GERD (gastroesophageal reflux disease)    Hiatal hernia    History of basal cell carcinoma excision    face/ nose right side   History of bilateral breast cancer followed by oncologist until 2008,  per pt no recurrence   right 2002  and left 2003 s/p  mastectomy with completed chemo and tamexifen therapy    History of cervical cancer 2000   s/p  TAH w/ BSO   History of diverticulitis of colon    Hyperlipidemia    OSA on CPAP    PAD (peripheral artery disease) (HCC)    Peripheral neuropathy    PONV (postoperative nausea and vomiting)    S/P AAA (abdominal aortic aneurysm) repair 09/15/2011   Stress incontinence due to pelvic organ prolapse    Type 2 diabetes mellitus treated with insulin ([facility]    followed by pcp   Wears glasses     Past Surgical History:  Procedure Laterality Date   ABDOMINAL HYSTERECTOMY  2000   w/  BSO,  and Bladder tacking abdominally   CARPAL TUNNEL RELEASE Left 1999   CATARACT EXTRACTION W/ INTRAOCULAR LENS  IMPLANT, BILATERAL  2015   ENDOVASCULAR REPAIR/STENT GRAFT  09-15-2011    @[facility]    INCONTINENCE SURGERY  2007  approx.   MASTECTOMY Bilateral right 2002;  left 2003   lymph node dissection done only on right side   ROBOTIC ASSISTED LAPAROSCOPIC SACROCOLPOPEXY N/A 05/23/2019   Procedure: XI ROBOTIC ASSISTED LAPAROSCOPIC SACROCOLPOPEXY;  Surgeon: Ardis Hughs, MD;  Location: [facility];  Service: Urology;  Laterality: N/A;   TONSILLECTOMY  child    TYMPANOPLASTY Right 1970s   "had my ear drum replacement with a plastic one"   WRIST GANGLION EXCISION Left 1998    Social History   Tobacco Use   Smoking status: Former    Years: 30.00    Types: Cigarettes    Quit date: 06/07/1999    Years since quitting: 22.2   Smokeless tobacco: Never  Vaping Use   Vaping Use: Never used  Substance Use Topics   Alcohol use: Never   Drug use: Never    Family History  Problem Relation Age of Onset   Cancer Mother    Pneumonia Mother    Thyroid disease Mother    Thyroid disease Sister    Obesity Daughter    Hypertension Daughter    Thyroid disease Son    Pneumonia Maternal Grandmother    Heart attack Maternal Grandfather    Diabetes Neg Hx     Allergies  Allergen Reactions   Aspirin     Per pt any pain medications - causes fluid build up    Bee Venom Swelling   Ibuprofen Swelling   Ozempic (0.25 Or 0.5 Mg-Dose) [Semaglutide(0.25 Or 0.74m-Dos)] Diarrhea and Nausea And Vomiting    Flatulence   Poison Oak Extract Itching and Swelling   Statins     "skin feels like bugs under the skin"   Trulicity [Dulaglutide] Nausea And Vomiting and Other (See Comments)    Flatulance, "deathly sick"   Tylenol [Acetaminophen] Swelling   Victoza [Liraglutide] Other (See Comments)    Stomach upset issues     Medication list has been reviewed and updated.  Current Outpatient Medications on File Prior to Visit  Medication Sig Dispense Refill   Accu-Chek Softclix Lancets lancets TEST BLOOD SUGAR THREE TIMES DAILY 300 each 1   Alcohol Swabs (DROPSAFE ALCOHOL PREP) 70 % PADS USE TO TEST BLOOD SUGAR 3 TIMES DAILY. 300 each 2   Alcohol Swabs PADS Use to test blood sugar 3 times daily. Dx: E11.9 300 each 2   b complex vitamins tablet Take 0.5 tablets by mouth 2 (two) times daily.      Biotin 5 MG TABS Take 5,000 mcg by mouth at bedtime.      Blood Glucose Calibration (GLUCOSE CONTROL) SOLN Use to test blood sugar 3 times daily. Dx: E11.9 1 each 2    Blood Glucose Monitoring Suppl (ACCU-CHEK AVIVA PLUS) w/Device KIT USE AS DIRECTED  TO TEST BLOOD GLUCOSE BID E11.9 1 kit 0   cephALEXin (KEFLEX) 250 MG capsule Take 1 capsule (250 mg total) by mouth daily. Daily prophylaxis per urology 1 capsule 0   clopidogrel (PLAVIX) 75 MG tablet TAKE 1 TABLET (75 MG TOTAL) BY MOUTH DAILY. 90 tablet 2   Cranberry 500 MG TABS Take 500 mg by mouth daily.     gabapentin (NEURONTIN) 100  MG capsule TAKE 1 CAPSULE TWICE DAILY 180 capsule 3   gemfibrozil (LOPID) 600 MG tablet TAKE 1 TABLET TWICE DAILY BEFORE A MEAL 180 tablet 1   glipiZIDE (GLUCOTROL) 5 MG tablet Take 1 tablet (5 mg total) by mouth 2 (two) times daily before a meal. 180 tablet 1   glucose blood (ACCU-CHEK AVIVA PLUS) test strip TEST BLOOD SUGAR THREE TIMES DAILY 300 strip 12   insulin degludec (TRESIBA FLEXTOUCH) 100 UNIT/ML FlexTouch Pen Inject 60 Units into the skin at bedtime. 60 mL 3   Insulin Pen Needle 32G X 4 MM MISC Used to inject insulin 1x daily. 100 each 4   Insulin Syringe-Needle U-100 (INSULIN SYRINGE 1CC/30GX5/16") 30G X 5/16" 1 ML MISC Use to inject insulin 1 time per day. 100 each 2   levothyroxine (SYNTHROID) 50 MCG tablet Take 1 tablet (50 mcg total) by mouth daily. 90 tablet 3   magnesium oxide (MAG-OX) 400 MG tablet Take 200 mg by mouth daily.     Menthol, Topical Analgesic, (BIOFREEZE ROLL-ON EX) Apply 1 application topically as needed (joint pain).     metFORMIN (GLUCOPHAGE) 1000 MG tablet Take 1 tablet (1,000 mg total) by mouth 2 (two) times daily with a meal. 180 tablet 1   Multiple Vitamin (MULTIVITAMIN) tablet Take 0.5 tablets by mouth 2 (two) times daily.      niacin (NIASPAN) 500 MG CR tablet Take 1 tablet (500 mg total) by mouth at bedtime. 30 tablet 3   Omega-3 Fatty Acids (FISH OIL) 1000 MG CAPS Take 1,000 mg by mouth 2 (two) times a day.      pantoprazole (PROTONIX) 40 MG tablet Take 1 tablet (40 mg total) by mouth daily. 90 tablet 1   potassium chloride SA (KLOR-CON)  20 MEQ tablet TAKE 1 TABLET EVERY DAY 90 tablet 1   Probiotic Product (PROBIOTIC DAILY PO) Take 1 capsule by mouth daily.     VITAMIN D PO Take 1 capsule by mouth daily.      zinc gluconate 50 MG tablet Take 50 mg by mouth at bedtime.     No current facility-administered medications on file prior to visit.    Review of Systems:  As per HPI- otherwise negative.   Physical Examination: Vitals:   08/31/21 1300  BP: 140/84  Pulse: 75  Resp: 18  Temp: 98 F (36.7 C)  SpO2: 92%   Vitals:   08/31/21 1300  Weight: 184 lb 6.4 oz (83.6 kg)  Height: 5' 4.17" (1.63 m)   Body mass index is 31.48 kg/m. Ideal Body Weight: Weight in (lb) to have BMI = 25: 146.1  GEN: no acute distress. Obese, appears her normal self HEENT: Atraumatic, Normocephalic.  Ears and Nose: No external deformity. CV: RRR, No M/G/R. No JVD. No thrill. No extra heart sounds. PULM: CTA B, no wheezes, crackles, rhonchi. No retractions. No resp. distress. No accessory muscle use. ABD: S, NT, ND, +BS. No rebound. No HSM. Status post bilateral mastectomy EXTR: No c/c/e PSYCH: Normally interactive. Conversant.    Assessment and Plan: Type 2 diabetes mellitus with complication, without long-term current use of insulin ([place]) - Plan: Comprehensive metabolic panel, Hemoglobin A1c  Lumbar radiculopathy  Essential hypertension, benign - Plan: CBC  Dyslipidemia - Plan: Lipid panel  Medication monitoring encounter  Fatty liver  Acquired hypothyroidism - Plan: TSH  Following up on chronic conditions as above.  Diabetes has been fairly difficult to control Exercise is more limited now due to her age and orthopedic factors  Has declined colon cancer screening, has declined immunizations Will plan further follow- up pending labs.   This visit occurred during the SARS-CoV-2 public health emergency.  Safety protocols were in place, including screening questions prior to the visit, additional usage of staff PPE, and  extensive cleaning of exam room while observing appropriate contact time as indicated for disinfecting solutions.   Signed Lamar Blinks, MD  Received her labs 9/30 -letter to pt LFTs improved from March  Lipids also a bit better A1c better Results for orders placed or performed in visit on 08/31/21  CBC  Result Value Ref Range   WBC 6.4 4.0 - 10.5 K/uL   RBC 4.59 3.87 - 5.11 Mil/uL   Platelets 260.0 150.0 - 400.0 K/uL   Hemoglobin 13.2 12.0 - 15.0 g/dL   HCT 39.5 36.0 - 46.0 %   MCV 85.9 78.0 - 100.0 fl   MCHC 33.6 30.0 - 36.0 g/dL   RDW 16.2 (H) 11.5 - 15.5 %  Comprehensive metabolic panel  Result Value Ref Range   Sodium 139 135 - 145 mEq/L   Potassium 4.0 3.5 - 5.1 mEq/L   Chloride 100 96 - 112 mEq/L   CO2 27 19 - 32 mEq/L   Glucose, Bld 134 (H) 70 - 99 mg/dL   BUN 17 6 - 23 mg/dL   Creatinine, Ser 0.76 0.40 - 1.20 mg/dL   Total Bilirubin 0.5 0.2 - 1.2 mg/dL   Alkaline Phosphatase 35 (L) 39 - 117 U/L   AST 42 (H) 0 - 37 U/L   ALT 41 (H) 0 - 35 U/L   Total Protein 7.4 6.0 - 8.3 g/dL   Albumin 4.6 3.5 - 5.2 g/dL   GFR 75.87 >60.00 mL/min   Calcium 9.8 8.4 - 10.5 mg/dL  Hemoglobin A1c  Result Value Ref Range   Hgb A1c MFr Bld 7.9 (H) 4.6 - 6.5 %  Lipid panel  Result Value Ref Range   Cholesterol 182 0 - 200 mg/dL   Triglycerides (H) 0.0 - 149.0 mg/dL    572.0 Triglyceride is over 400; calculations on Lipids are invalid.   HDL 24.30 (L) >39.00 mg/dL   Total CHOL/HDL Ratio 7   TSH  Result Value Ref Range   TSH 3.55 0.35 - 5.50 uIU/mL  LDL cholesterol, direct  Result Value Ref Range   Direct LDL 72.0 mg/dL

## 2021-08-28 NOTE — Patient Instructions (Addendum)
I will be in touch with your labs ASAP! Please consider signing up for my chart, I will make it a lot easier for [place] to communicate  We will check on you A1c/ blood sugar control

## 2021-08-31 ENCOUNTER — Other Ambulatory Visit: Payer: Self-pay

## 2021-08-31 ENCOUNTER — Ambulatory Visit (INDEPENDENT_AMBULATORY_CARE_PROVIDER_SITE_OTHER): Payer: Medicare HMO | Admitting: Family Medicine

## 2021-08-31 VITALS — BP 140/84 | HR 75 | Temp 98.0°F | Resp 18 | Ht 64.17 in | Wt 184.4 lb

## 2021-08-31 DIAGNOSIS — E785 Hyperlipidemia, unspecified: Secondary | ICD-10-CM | POA: Diagnosis not present

## 2021-08-31 DIAGNOSIS — Z5181 Encounter for therapeutic drug level monitoring: Secondary | ICD-10-CM

## 2021-08-31 DIAGNOSIS — E118 Type 2 diabetes mellitus with unspecified complications: Secondary | ICD-10-CM

## 2021-08-31 DIAGNOSIS — I10 Essential (primary) hypertension: Secondary | ICD-10-CM | POA: Diagnosis not present

## 2021-08-31 DIAGNOSIS — M5416 Radiculopathy, lumbar region: Secondary | ICD-10-CM | POA: Diagnosis not present

## 2021-08-31 DIAGNOSIS — E039 Hypothyroidism, unspecified: Secondary | ICD-10-CM

## 2021-08-31 DIAGNOSIS — K760 Fatty (change of) liver, not elsewhere classified: Secondary | ICD-10-CM

## 2021-08-31 LAB — CBC
HCT: 39.5 % (ref 36.0–46.0)
Hemoglobin: 13.2 g/dL (ref 12.0–15.0)
MCHC: 33.6 g/dL (ref 30.0–36.0)
MCV: 85.9 fl (ref 78.0–100.0)
Platelets: 260 10*3/uL (ref 150.0–400.0)
RBC: 4.59 Mil/uL (ref 3.87–5.11)
RDW: 16.2 % — ABNORMAL HIGH (ref 11.5–15.5)
WBC: 6.4 10*3/uL (ref 4.0–10.5)

## 2021-08-31 LAB — COMPREHENSIVE METABOLIC PANEL
ALT: 41 U/L — ABNORMAL HIGH (ref 0–35)
AST: 42 U/L — ABNORMAL HIGH (ref 0–37)
Albumin: 4.6 g/dL (ref 3.5–5.2)
Alkaline Phosphatase: 35 U/L — ABNORMAL LOW (ref 39–117)
BUN: 17 mg/dL (ref 6–23)
CO2: 27 mEq/L (ref 19–32)
Calcium: 9.8 mg/dL (ref 8.4–10.5)
Chloride: 100 mEq/L (ref 96–112)
Creatinine, Ser: 0.76 mg/dL (ref 0.40–1.20)
GFR: 75.87 mL/min (ref >60.00)
Glucose, Bld: 134 mg/dL — ABNORMAL HIGH (ref 70–99)
Potassium: 4 mEq/L (ref 3.5–5.1)
Sodium: 139 mEq/L (ref 135–145)
Total Bilirubin: 0.5 mg/dL (ref 0.2–1.2)
Total Protein: 7.4 g/dL (ref 6.0–8.3)

## 2021-08-31 LAB — LIPID PANEL
Cholesterol: 182 mg/dL (ref 0–200)
HDL: 24.3 mg/dL — ABNORMAL LOW (ref >39.00)
Total CHOL/HDL Ratio: 7
Triglycerides: 572 mg/dL — ABNORMAL HIGH (ref 0.0–149.0)

## 2021-08-31 LAB — HEMOGLOBIN A1C: Hgb A1c MFr Bld: 7.9 % — ABNORMAL HIGH (ref 4.6–6.5)

## 2021-08-31 LAB — LDL CHOLESTEROL, DIRECT: Direct LDL: 72 mg/dL

## 2021-08-31 LAB — TSH: TSH: 3.55 u[IU]/mL (ref 0.35–5.50)

## 2021-09-19 ENCOUNTER — Telehealth: Payer: Self-pay | Admitting: Family Medicine

## 2021-09-28 NOTE — Telephone Encounter (Signed)
Error

## 2021-09-29 DIAGNOSIS — Z48812 Encounter for surgical aftercare following surgery on the circulatory system: Secondary | ICD-10-CM | POA: Diagnosis not present

## 2021-09-29 DIAGNOSIS — I714 Abdominal aortic aneurysm, without rupture, unspecified: Secondary | ICD-10-CM | POA: Diagnosis not present

## 2021-10-17 DIAGNOSIS — R1084 Generalized abdominal pain: Secondary | ICD-10-CM | POA: Diagnosis not present

## 2021-10-17 DIAGNOSIS — N3020 Other chronic cystitis without hematuria: Secondary | ICD-10-CM | POA: Diagnosis not present

## 2021-11-12 ENCOUNTER — Other Ambulatory Visit: Payer: Self-pay | Admitting: Family Medicine

## 2021-11-12 DIAGNOSIS — E118 Type 2 diabetes mellitus with unspecified complications: Secondary | ICD-10-CM

## 2021-12-26 ENCOUNTER — Other Ambulatory Visit: Payer: Self-pay | Admitting: Family Medicine

## 2021-12-26 DIAGNOSIS — E039 Hypothyroidism, unspecified: Secondary | ICD-10-CM

## 2022-01-02 ENCOUNTER — Telehealth: Payer: Self-pay | Admitting: Family Medicine

## 2022-01-02 ENCOUNTER — Other Ambulatory Visit: Payer: Self-pay

## 2022-01-02 DIAGNOSIS — K219 Gastro-esophageal reflux disease without esophagitis: Secondary | ICD-10-CM

## 2022-01-02 MED ORDER — PANTOPRAZOLE SODIUM 40 MG PO TBEC: 40.0000 mg | DELAYED_RELEASE_TABLET | Freq: Every day | ORAL | 1 refills | Status: DC

## 2022-01-02 NOTE — Telephone Encounter (Signed)
Patient states her metformin and pantoprazole were sent to the wrong pharmacy, and she needs it to be sent to: [facility] [address]  [place], [place] [address]  Phone:  [phone]  Fax:  [phone]  Please advise.

## 2022-01-02 NOTE — Telephone Encounter (Signed)
Rx sent 

## 2022-01-24 DIAGNOSIS — R69 Illness, unspecified: Secondary | ICD-10-CM | POA: Diagnosis not present

## 2022-01-27 ENCOUNTER — Other Ambulatory Visit: Payer: Self-pay | Admitting: Family Medicine

## 2022-01-27 DIAGNOSIS — E785 Hyperlipidemia, unspecified: Secondary | ICD-10-CM

## 2022-02-07 ENCOUNTER — Other Ambulatory Visit: Payer: Self-pay | Admitting: Family Medicine

## 2022-02-07 DIAGNOSIS — Z5181 Encounter for therapeutic drug level monitoring: Secondary | ICD-10-CM

## 2022-02-10 ENCOUNTER — Other Ambulatory Visit: Payer: Self-pay | Admitting: Family Medicine

## 2022-02-10 DIAGNOSIS — Z9889 Other specified postprocedural states: Secondary | ICD-10-CM

## 2022-02-17 NOTE — Progress Notes (Addendum)
[facility], [address] ?[place], [place] [address] ?[phone] ?Fax [phone] ? ?Date:  02/20/2022  ? ?Name:  Mandy Kim   DOB:  May 29, 1946   MRN:  [id number] ? ?PCP:  Darreld Mclean, MD  ? ? ?Chief Complaint: 6 month follow up (Concerns/ questions: 1. Pt says she gets really dizzy, and has some nausea x 3 days. [place] exam: scheduled for June/- no ncir-) ? ? ?History of Present Illness: ? ?Mandy Kim is a 76 y.o. very pleasant female patient who presents with the following: ? ?Patient seen today for follow-up ?Most recent visit with myself was in September ? ?History of poorly controlled diabetes, fatty liver, AAA status post repair 2013, hyperlipidemia, cervical cancer status post hysterectomy year 2000, bilateral breast cancer status post double mastectomy ?She did a treadmill test in 2020, reassuring ?She has not been able to tolerate statins and as such has uncontrolled hyperlipidemia ?She has refused COVID-19 vaccination ?Has declined further colon cancer screening ? ?Eye exam is scheduled this summer ?Can update urine micro ?A1c is due ?Foot exam due ? ?She had follow-up of her AAA in October, [facility]-stable ultrasound report ? ?Lab Results  ?Component Value Date  ? HGBA1C 7.9 (H) 08/31/2021  ? ?Tresiba insulin, 60 units daily ?Potassium 20 mEq ?Protonix ?Niaspan ?Gemfibrozil ?Metformin ?Levothyroxine 50 ?Glipizide 5 twice daily ?Gabapentin 100 twice daily ?Keflex 250 daily prophylactics per urology ?Plavix ? ?Pt notes she has intermittently felt dizzy -she notes a sensation of vertigo with "the whole room spinning"- for a couple of days.  Symptoms last a few minutes, longer than a few seconds but less than 5 minutes.  She notes a mild headache but this is not new  ?No tinnitus with this dizziness but she may get tinnitus on occasion ?She had 2 episodes of vertigo yesterday and one today ?Vertigo was not associated with head movement or standing  up from seated.  Will occur randomly.  Patient notes she may "stagger" when she feels vertigo and and have dry heaves ? ?In between episodes of vertigo she feels relatively normal ? ?She had some dental extractions done over the last month and they mentioned her BP was high- highest 191/90 ?Patient actually notes she has been undergoing some dental extractions the last 6 months and her blood pressure has been consistently high ? ?No CP or SOB ?No numbness or weakness of any of her limbs.  No slurred speech or difficulty swallowing ? ?BP Readings from Last 3 Encounters:  ?02/20/22 (!) 165/90  ?08/31/21 140/84  ?02/28/21 130/82  ? ? ?Patient Active Problem List  ? Diagnosis Date Noted  ? Myositis, unspecified 04/27/2020  ? Prolapse of female pelvic organs 05/24/2019  ? Cystocele with prolapse 05/23/2019  ? Hypoxia   ? Sepsis ([place]) 05/28/2017  ? Elevated LFTs 05/28/2017  ? Acute lower UTI 05/28/2017  ? Dehydration 05/28/2017  ? Peripheral neuropathy 07/27/2014  ? AAA (abdominal aortic aneurysm) ([place]) 02/12/2012  ? Fatty liver 02/12/2012  ? Breast cancer ([place]) 02/12/2012  ? Cervical cancer ([place]) 02/12/2012  ? Diabetes mellitus ([place]) 02/12/2012  ? Hyperlipidemia 02/12/2012  ? ? ?Past Medical History:  ?Diagnosis Date  ? Anticoagulant long-term use   ? plavix  ? [facility])   ? CHF (congestive heart failure) ([place]) 2000  ? Chronic cystitis   ? Chronic kidney disease   ? Diverticulitis   ? GERD (gastroesophageal reflux disease)   ? Hiatal  hernia   ? History of basal cell carcinoma excision   ? face/ nose right side  ? History of bilateral breast cancer followed by oncologist until 2008,  per pt no recurrence  ? right 2002  and left 2003 s/p  mastectomy with completed chemo and tamexifen therapy   ? History of cervical cancer 2000  ? s/p  TAH w/ BSO  ? History of diverticulitis of colon   ? Hyperlipidemia   ? OSA on CPAP   ? PAD (peripheral artery disease) ([place])   ? Peripheral neuropathy   ? PONV (postoperative nausea and  vomiting)   ? S/P AAA (abdominal aortic aneurysm) repair 09/15/2011  ? Stress incontinence due to pelvic organ prolapse   ? Type 2 diabetes mellitus treated with insulin ([place])   ? followed by pcp  ? Wears glasses   ? ? ?Past Surgical History:  ?Procedure Laterality Date  ? ABDOMINAL HYSTERECTOMY  2000  ? w/  BSO,  and Bladder tacking abdominally  ? CARPAL TUNNEL RELEASE Left 1999  ? CATARACT EXTRACTION W/ INTRAOCULAR LENS  IMPLANT, BILATERAL  2015  ? ENDOVASCULAR REPAIR/STENT GRAFT  09-15-2011    @[facility]   ? INCONTINENCE SURGERY  2007  approx.  ? MASTECTOMY Bilateral right 2002;  left 2003  ? lymph node dissection done only on right side  ? ROBOTIC ASSISTED LAPAROSCOPIC SACROCOLPOPEXY N/A 05/23/2019  ? Procedure: XI ROBOTIC ASSISTED LAPAROSCOPIC SACROCOLPOPEXY;  Surgeon: Ardis Hughs, MD;  Location: [facility];  Service: Urology;  Laterality: N/A;  ? TONSILLECTOMY  child  ? TYMPANOPLASTY Right 1970s  ? "had my ear drum replacement with a plastic one"  ? WRIST GANGLION EXCISION Left 1998  ? ? ?Social History  ? ?Tobacco Use  ? Smoking status: Former  ?  Years: 30.00  ?  Types: Cigarettes  ?  Quit date: 06/07/1999  ?  Years since quitting: 22.7  ? Smokeless tobacco: Never  ?Vaping Use  ? Vaping Use: Never used  ?Substance Use Topics  ? Alcohol use: Never  ? Drug use: Never  ? ? ?Family History  ?Problem Relation Age of Onset  ? Cancer Mother   ? Pneumonia Mother   ? Thyroid disease Mother   ? Thyroid disease Sister   ? Obesity Daughter   ? Hypertension Daughter   ? Thyroid disease Son   ? Pneumonia Maternal Grandmother   ? Heart attack Maternal Grandfather   ? Diabetes Neg Hx   ? ? ?Allergies  ?Allergen Reactions  ? Aspirin   ?  Per pt any pain medications - causes fluid build up   ? Bee Venom Swelling  ? Ibuprofen Swelling  ? Ozempic (0.25 Or 0.5 Mg-Dose) [Semaglutide(0.25 Or 0.57m-Dos)] Diarrhea and Nausea And Vomiting  ?  Flatulence  ? Poison Oak Extract Itching and Swelling  ? Statins   ?  "skin feels like bugs  under the skin"  ? Trulicity [Dulaglutide] Nausea And Vomiting and Other (See Comments)  ?  Flatulance, "deathly sick"  ? Tylenol [Acetaminophen] Swelling  ? Victoza [Liraglutide] Other (See Comments)  ?  Stomach upset issues   ? ? ?Medication list has been reviewed and updated. ? ?Current Outpatient Medications on File Prior to Visit  ?Medication Sig Dispense Refill  ? Accu-Chek Softclix Lancets lancets TEST BLOOD SUGAR THREE TIMES DAILY 300 each 1  ? Alcohol Swabs (DROPSAFE ALCOHOL PREP) 70 % PADS USE TO TEST BLOOD SUGAR 3 TIMES DAILY. 300 each 2  ? Alcohol Swabs PADS Use  to test blood sugar 3 times daily. Dx: E11.9 300 each 2  ? b complex vitamins tablet Take 0.5 tablets by mouth 2 (two) times daily.     ? Biotin 5 MG TABS Take 5,000 mcg by mouth at bedtime.     ? Blood Glucose Calibration (GLUCOSE CONTROL) SOLN Use to test blood sugar 3 times daily. Dx: E11.9 1 each 2  ? Blood Glucose Monitoring Suppl (ACCU-CHEK AVIVA PLUS) w/Device KIT USE AS DIRECTED  TO TEST BLOOD GLUCOSE BID E11.9 1 kit 0  ? cephALEXin (KEFLEX) 250 MG capsule Take 1 capsule (250 mg total) by mouth daily. Daily prophylaxis per urology 1 capsule 0  ? clopidogrel (PLAVIX) 75 MG tablet TAKE 1 TABLET EVERY DAY 90 tablet 1  ? Cranberry 500 MG TABS Take 500 mg by mouth daily.    ? gabapentin (NEURONTIN) 100 MG capsule TAKE 1 CAPSULE TWICE DAILY 180 capsule 3  ? gemfibrozil (LOPID) 600 MG tablet TAKE 1 TABLET TWICE DAILY BEFORE MEALS 180 tablet 1  ? glipiZIDE (GLUCOTROL) 5 MG tablet TAKE 1 TABLET TWICE DAILY BEFORE A MEAL 180 tablet 1  ? glucose blood (ACCU-CHEK AVIVA PLUS) test strip TEST BLOOD SUGAR THREE TIMES DAILY 300 strip 12  ? Insulin Pen Needle 32G X 4 MM MISC Used to inject insulin 1x daily. 100 each 4  ? Insulin Syringe-Needle U-100 (INSULIN SYRINGE 1CC/30GX5/16") 30G X 5/16" 1 ML MISC Use to inject insulin 1 time per day. 100 each 2  ? levothyroxine (SYNTHROID) 50 MCG tablet Take 1 tablet (50 mcg total) by mouth daily before breakfast.  90 tablet 0  ? magnesium oxide (MAG-OX) 400 MG tablet Take 200 mg by mouth daily.    ? Menthol, Topical Analgesic, (BIOFREEZE ROLL-ON EX) Apply 1 application topically as needed (joint pain).    ? metFORMIN

## 2022-02-17 NOTE — Patient Instructions (Addendum)
It was good to see you again today, I will be in touch with your labs.  Please start on losartan 50 mg daily for your BP asap ? ?We will get you set up for an MRI of your head to make sure no sign of a stroke  ? ?If you are getting worse at all- difficulty speaking, walking, numbness or weakness in any of your limbs or face, severe headache- please go to the ER  ?

## 2022-02-20 ENCOUNTER — Ambulatory Visit (INDEPENDENT_AMBULATORY_CARE_PROVIDER_SITE_OTHER): Payer: Medicare HMO | Admitting: Family Medicine

## 2022-02-20 VITALS — BP 165/90 | HR 87 | Temp 97.7°F | Resp 18 | Ht 64.17 in | Wt 179.2 lb

## 2022-02-20 DIAGNOSIS — R7401 Elevation of levels of liver transaminase levels: Secondary | ICD-10-CM

## 2022-02-20 DIAGNOSIS — E118 Type 2 diabetes mellitus with unspecified complications: Secondary | ICD-10-CM | POA: Diagnosis not present

## 2022-02-20 DIAGNOSIS — R42 Dizziness and giddiness: Secondary | ICD-10-CM | POA: Diagnosis not present

## 2022-02-20 DIAGNOSIS — E039 Hypothyroidism, unspecified: Secondary | ICD-10-CM | POA: Diagnosis not present

## 2022-02-20 DIAGNOSIS — E785 Hyperlipidemia, unspecified: Secondary | ICD-10-CM

## 2022-02-20 DIAGNOSIS — I10 Essential (primary) hypertension: Secondary | ICD-10-CM | POA: Diagnosis not present

## 2022-02-20 MED ORDER — LOSARTAN POTASSIUM 50 MG PO TABS: 50.0000 mg | ORAL_TABLET | Freq: Every day | ORAL | 4 refills | Status: DC

## 2022-02-21 LAB — COMPREHENSIVE METABOLIC PANEL
ALT: 49 U/L — ABNORMAL HIGH (ref 0–35)
AST: 46 U/L — ABNORMAL HIGH (ref 0–37)
Albumin: 4.9 g/dL (ref 3.5–5.2)
Alkaline Phosphatase: 38 U/L — ABNORMAL LOW (ref 39–117)
BUN: 23 mg/dL (ref 6–23)
CO2: 25 mEq/L (ref 19–32)
Calcium: 10.1 mg/dL (ref 8.4–10.5)
Chloride: 102 mEq/L (ref 96–112)
Creatinine, Ser: 0.78 mg/dL (ref 0.40–1.20)
GFR: 73.3 mL/min (ref >60.00)
Glucose, Bld: 82 mg/dL (ref 70–99)
Potassium: 4.1 mEq/L (ref 3.5–5.1)
Sodium: 140 mEq/L (ref 135–145)
Total Bilirubin: 0.6 mg/dL (ref 0.2–1.2)
Total Protein: 7.7 g/dL (ref 6.0–8.3)

## 2022-02-21 LAB — CBC
HCT: 40.3 % (ref 36.0–46.0)
Hemoglobin: 13.6 g/dL (ref 12.0–15.0)
MCHC: 33.7 g/dL (ref 30.0–36.0)
MCV: 85.5 fl (ref 78.0–100.0)
Platelets: 278 10*3/uL (ref 150.0–400.0)
RBC: 4.72 Mil/uL (ref 3.87–5.11)
RDW: 16.6 % — ABNORMAL HIGH (ref 11.5–15.5)
WBC: 6.8 10*3/uL (ref 4.0–10.5)

## 2022-02-21 LAB — LIPID PANEL
Cholesterol: 207 mg/dL — ABNORMAL HIGH (ref 0–200)
HDL: 30.6 mg/dL — ABNORMAL LOW (ref >39.00)
Total CHOL/HDL Ratio: 7
Triglycerides: 440 mg/dL — ABNORMAL HIGH (ref 0.0–149.0)

## 2022-02-21 LAB — MICROALBUMIN / CREATININE URINE RATIO
Creatinine,U: 51.9 mg/dL
Microalb Creat Ratio: 17.8 mg/g (ref 0.0–30.0)
Microalb, Ur: 9.3 mg/dL — ABNORMAL HIGH (ref 0.0–1.9)

## 2022-02-21 LAB — HEMOGLOBIN A1C: Hgb A1c MFr Bld: 7.9 % — ABNORMAL HIGH (ref 4.6–6.5)

## 2022-02-21 LAB — TSH: TSH: 2.16 u[IU]/mL (ref 0.35–5.50)

## 2022-02-21 LAB — LDL CHOLESTEROL, DIRECT: Direct LDL: 96 mg/dL

## 2022-02-21 NOTE — Addendum Note (Signed)
Addended by: Lamar Blinks C on: 02/21/2022 04:54 PM ? ? Modules accepted: Orders ? ?

## 2022-02-22 ENCOUNTER — Ambulatory Visit
Admission: RE | Admit: 2022-02-22 | Discharge: 2022-02-22 | Disposition: A | Discharge status: Home / Self Care | Payer: Medicare HMO | Source: Ambulatory Visit | Attending: Family Medicine | Admitting: Family Medicine

## 2022-02-22 ENCOUNTER — Other Ambulatory Visit: Payer: Self-pay

## 2022-02-22 DIAGNOSIS — R42 Dizziness and giddiness: Secondary | ICD-10-CM

## 2022-02-22 IMAGING — MR MR HEAD W/O CM
10 series · 48 of 48 positions shown · non-contrast
Comparison: No pertinent prior exam.

CLINICAL DATA: Dizziness. Rule out CVA. History of breast cancer
two thousand

EXAM:
MRI HEAD WITHOUT CONTRAST
MRA HEAD WITHOUT CONTRAST
TECHNIQUE: Multiplanar, multi-echo pulse sequences of the brain and surrounding
structures were acquired without intravenous contrast. Angiographic
images of the Circle of Willis were acquired using MRA technique
without intravenous contrast.

[Series 1: T1 · sagittal · 5.0mm · 0.45mm/px · 2 of 22 slices shown]
[im 1/22]
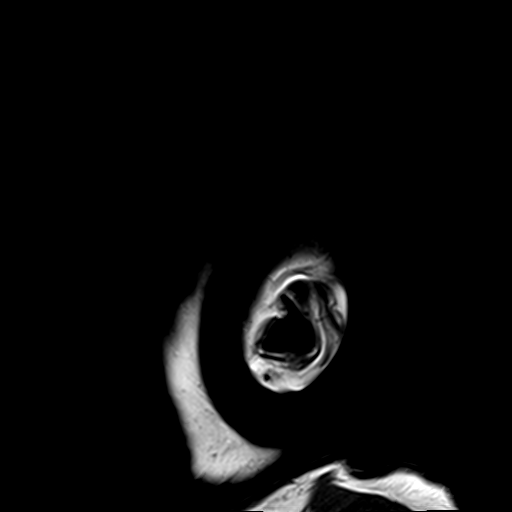
[im 22/22]
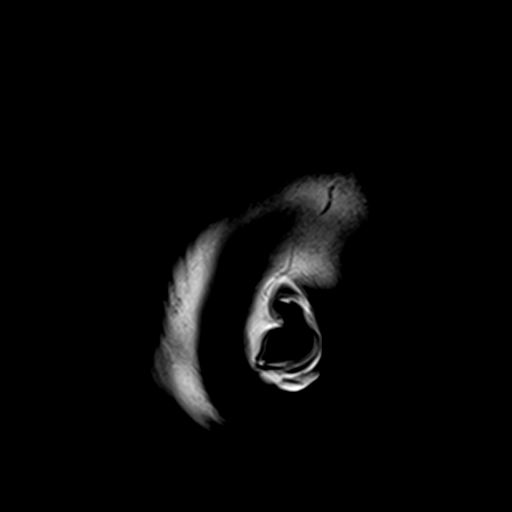

[Series 2: DWI · axial · 3.0mm · 1.80mm/px · z∈[-62,+79]mm · 9 of 100 slices shown (1 of 4)]
[im 1/100]
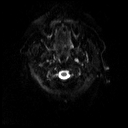
[im 13/100]
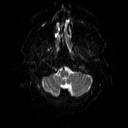
[im 25/100]
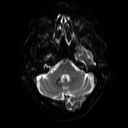
[im 38/100]
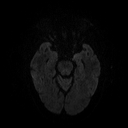
[im 50/100]
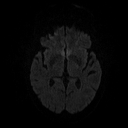
[im 62/100]
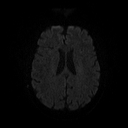
[im 75/100]
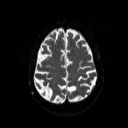
[im 87/100]
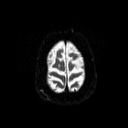
[im 100/100]
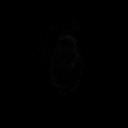

[Series 3: DWI · axial · 3.0mm · 1.80mm/px · z∈[-62,+79]mm · 4 of 47 slices shown (2 of 4)]
[im 1/47]
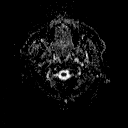
[im 16/47]
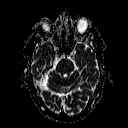
[im 31/47]
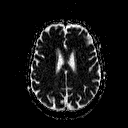
[im 47/47]
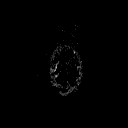

[Series 4: DWI · coronal · 5.0mm · 1.80mm/px · 6 of 70 slices shown (3 of 4)]
[im 1/70]
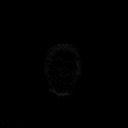
[im 14/70]
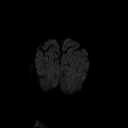
[im 28/70]
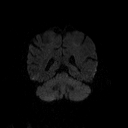
[im 42/70]
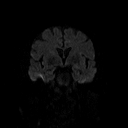
[im 56/70]
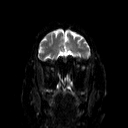
[im 70/70]
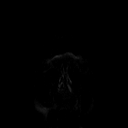

[Series 5: DWI · coronal · 5.0mm · 1.80mm/px · 3 of 36 slices shown (4 of 4)]
[im 1/36]
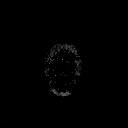
[im 18/36]
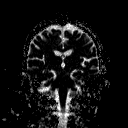
[im 36/36]
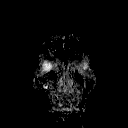

[Series 6: T2 · axial · 5.0mm · 0.51mm/px · z∈[-66,+82]mm · 2 of 23 slices shown (1 of 2)]
[im 1/23]
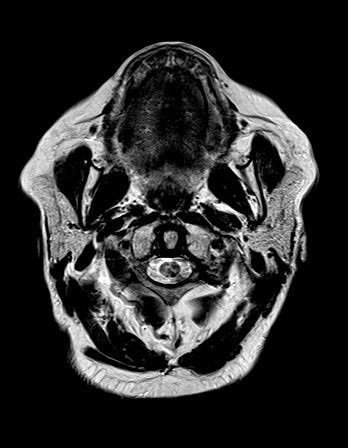
[im 23/23]
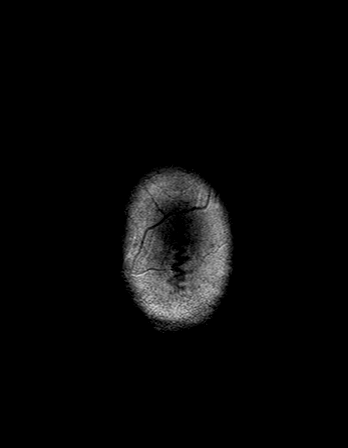

[Series 7: FLAIR · axial · 3.0mm · 0.45mm/px · z∈[-70,+78]mm · 3 of 34 slices shown]
[im 1/34]
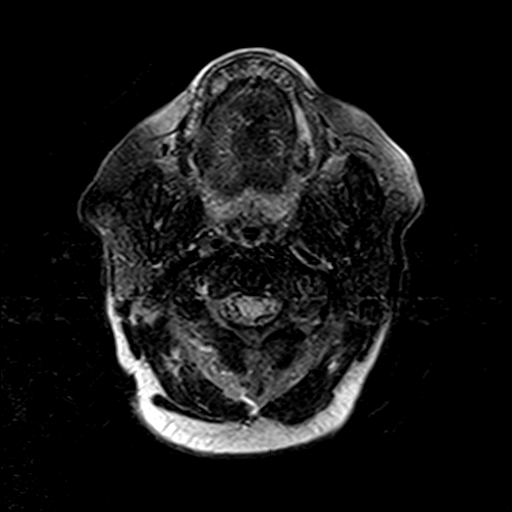
[im 17/34]
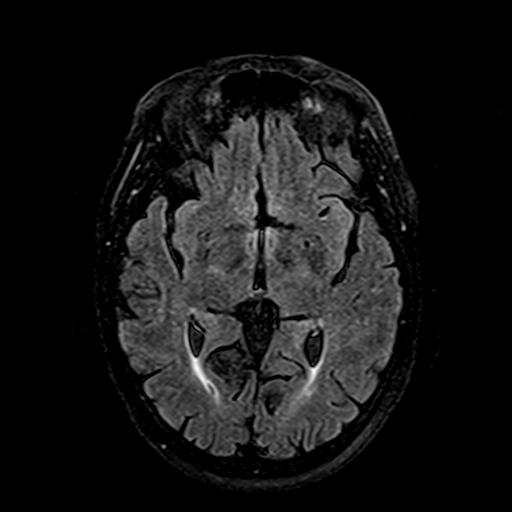
[im 34/34]
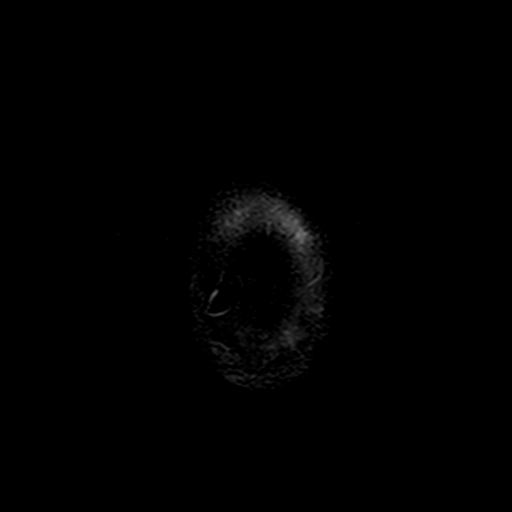

[Series 9: swi_images · axial · 4.0mm · 0.90mm/px · z∈[-70,+80]mm · 3 of 40 slices shown]
[im 1/40]
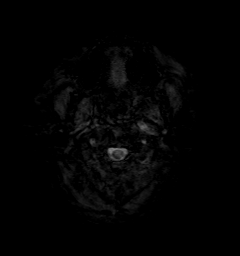
[im 20/40]
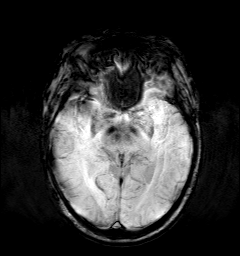
[im 40/40]
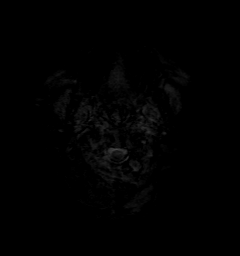

[Series 10: t1_mpr_tra · axial · 1.0mm · 0.71mm/px · z∈[-68,+85]mm · 14 of 160 slices shown]
[im 1/160]
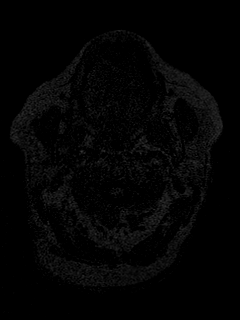
[im 13/160]
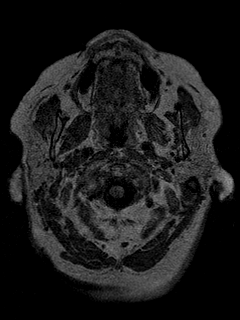
[im 25/160]
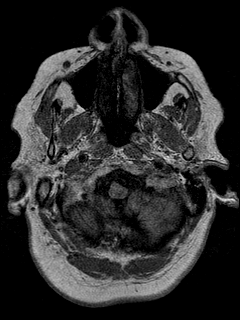
[im 37/160]
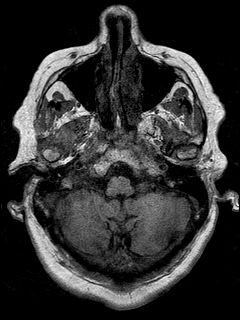
[im 49/160]
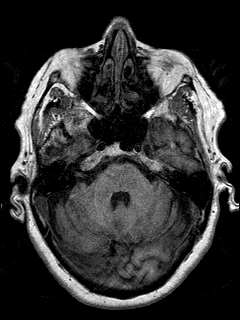
[im 62/160]
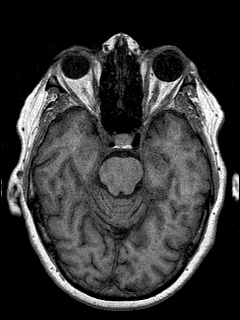
[im 74/160]
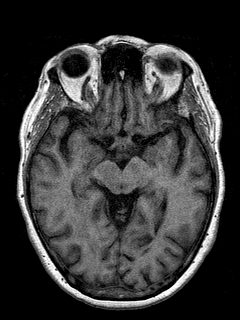
[im 86/160]
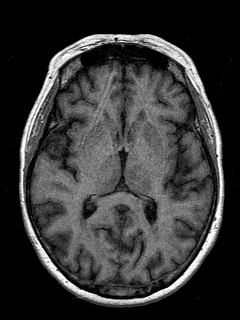
[im 98/160]
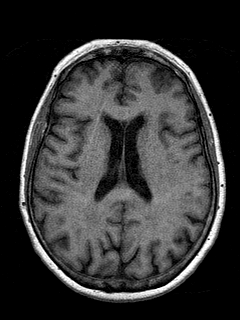
[im 111/160]
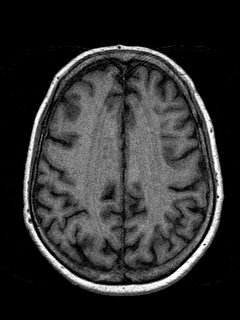
[im 123/160]
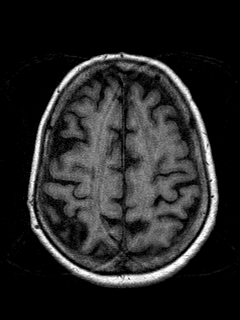
[im 135/160]
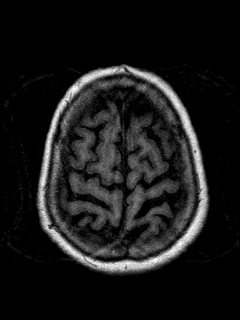
[im 147/160]
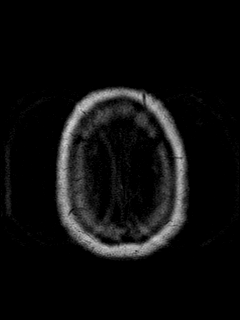
[im 160/160]
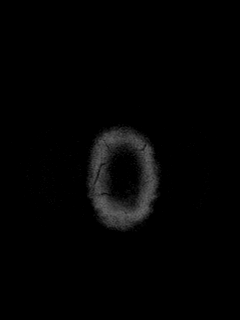

[Series 11: T2 · coronal · 5.0mm · 0.45mm/px · 2 of 28 slices shown (2 of 2)]
[im 1/28]
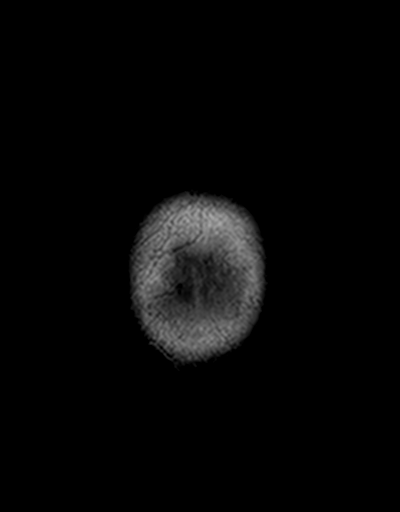
[im 28/28]
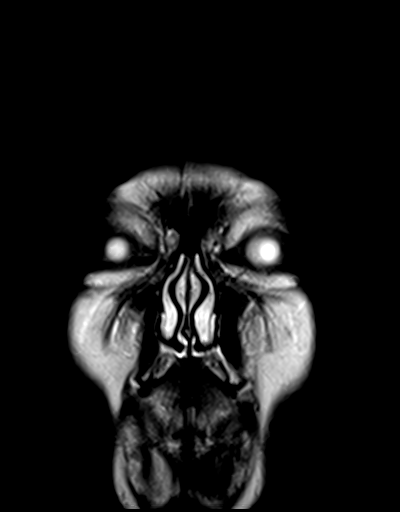

[48 of 48 positions shown; findings below may reference images not displayed]

FINDINGS: MRI HEAD FINDINGS

Brain: No acute infarction, hydrocephalus, extra-axial collection or
mass lesion.

Mild white matter changes with few small white matter
hyperintensities bilaterally. Chronic microhemorrhage left frontal
white matter.

Vascular: Normal arterial flow voids at the skull base

Skull and upper cervical spine: No focal abnormality.

Sinuses/Orbits: Paranasal sinuses clear. Bilateral cataract
extraction

Other: None

MRA HEAD FINDINGS

Anterior circulation: Internal carotid artery widely patent
bilaterally. Anterior and middle cerebral arteries patent without
stenosis or large vessel occlusion.

5.5 mm aneurysm left posterior communicating artery region.

Posterior circulation: Both vertebral arteries patent to the
basilar. Areas of loss of signal related to artifact below the skull
base. Basilar patent. Posterior cerebral arteries patent
bilaterally. Irregularity of the left could be artifact or due to
atherosclerotic disease.

Anatomic variants: None
IMPRESSION: 1. Negative for acute infarct. Mild chronic microvascular ischemic
change. Solitary chronic microhemorrhage focus left frontal white
matter. Correlate with risk factors for small vessel disease.
2. 5.5 mm left posterior communicating artery aneurysm.
3. Irregularity left posterior cerebral artery which could be
artifact or due to atherosclerotic stenosis.

## 2022-02-22 IMAGING — MR MR MRA HEAD W/O CM
1 series · 22 of 48 positions shown · non-contrast
Comparison: No pertinent prior exam.

CLINICAL DATA: Dizziness. Rule out CVA. History of breast cancer
two thousand

EXAM:
MRI HEAD WITHOUT CONTRAST
MRA HEAD WITHOUT CONTRAST
TECHNIQUE: Multiplanar, multi-echo pulse sequences of the brain and surrounding
structures were acquired without intravenous contrast. Angiographic
images of the Circle of Willis were acquired using MRA technique
without intravenous contrast.

[Series 3: tof_3d_multi-slab · axial · 0.7mm · 0.35mm/px · z∈[-105,-0]mm · 22 of 162 slices shown]
[im 1/162]
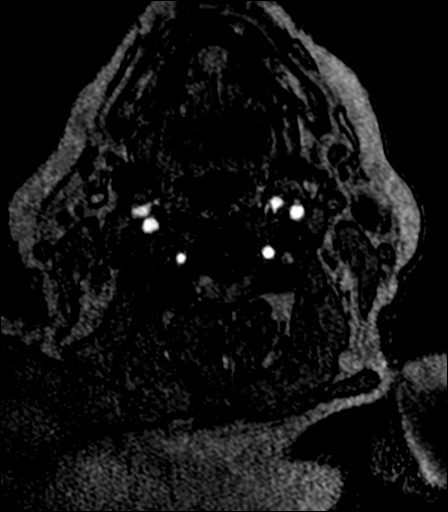
[im 4/162]
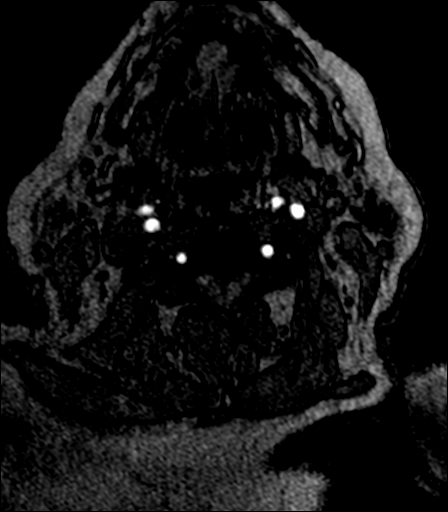
[im 7/162]
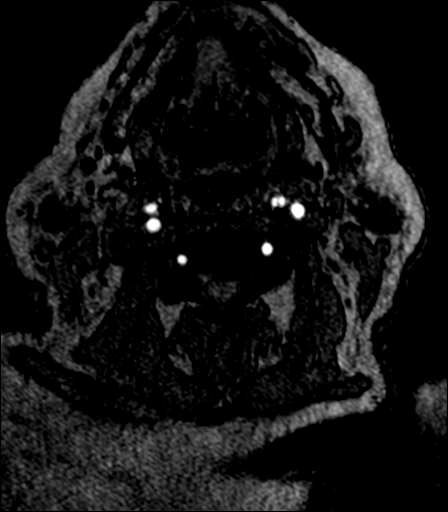
[im 11/162]
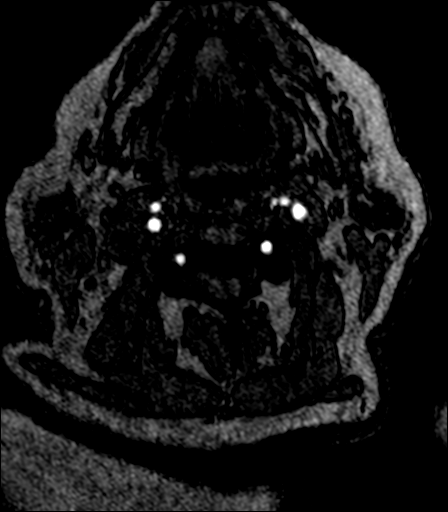
[im 14/162]
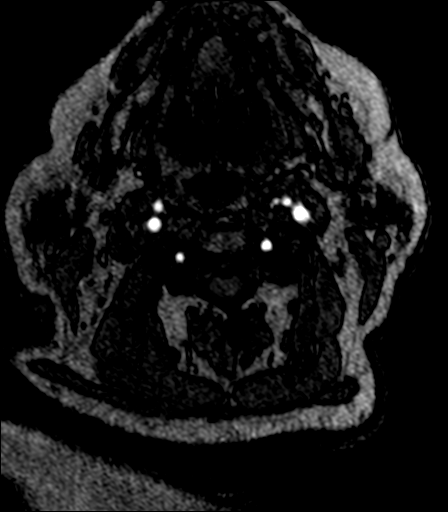
[im 18/162]
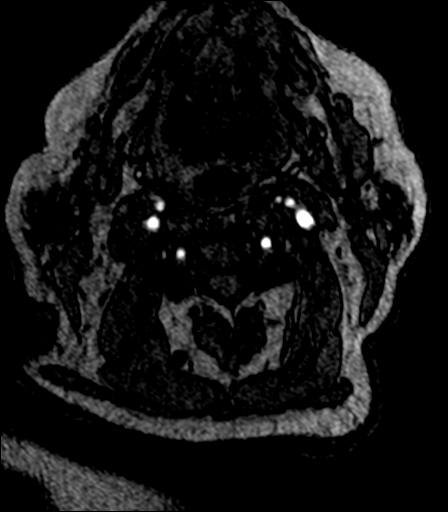
[im 21/162]
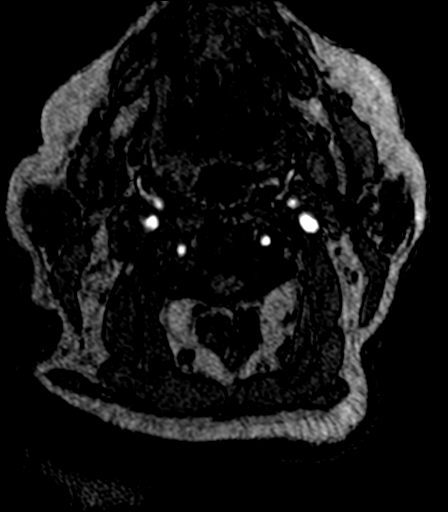
[im 24/162]
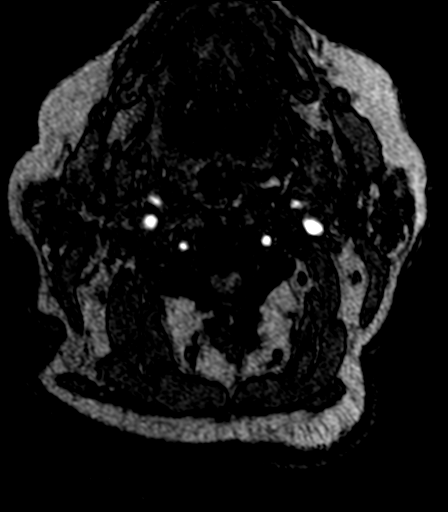
[im 28/162]
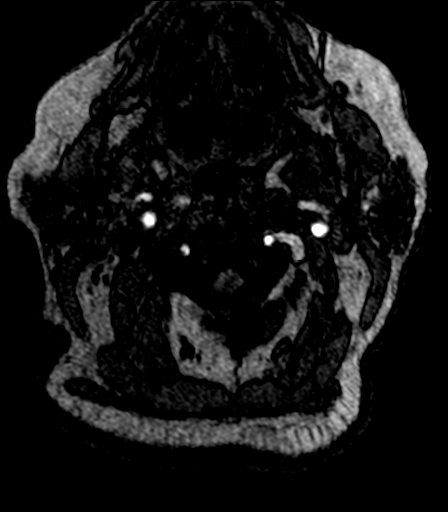
[im 31/162]
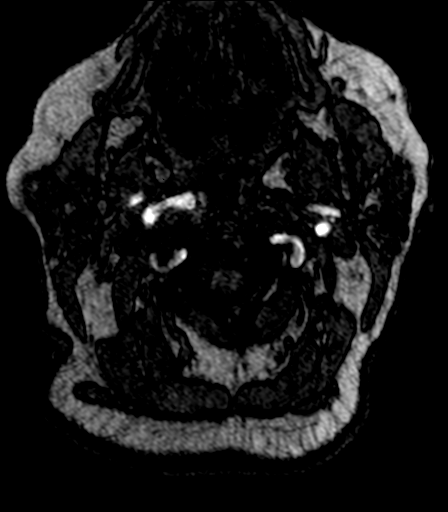
[im 35/162]
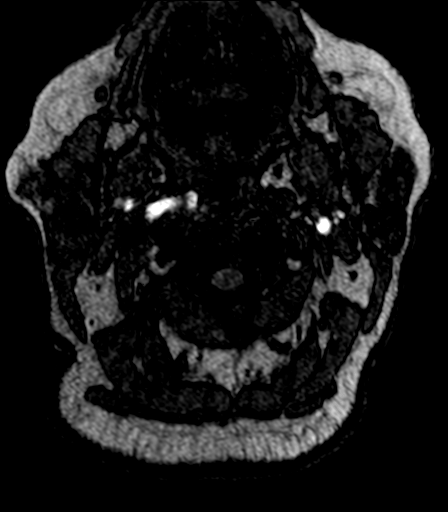
[im 38/162]
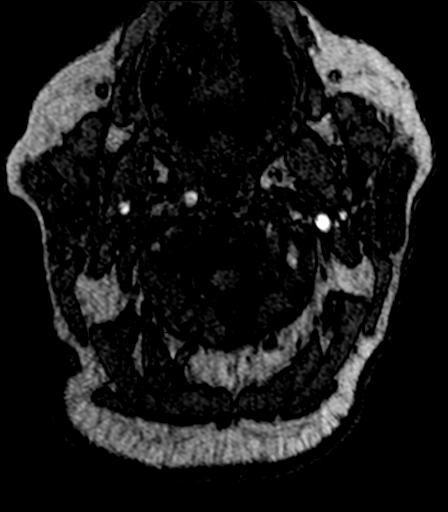
[im 42/162]
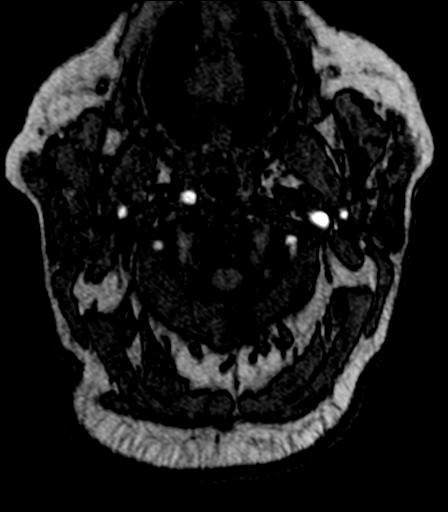
[im 45/162]
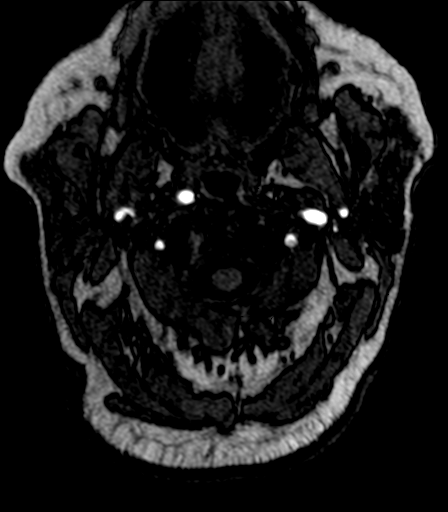
[im 52/162]
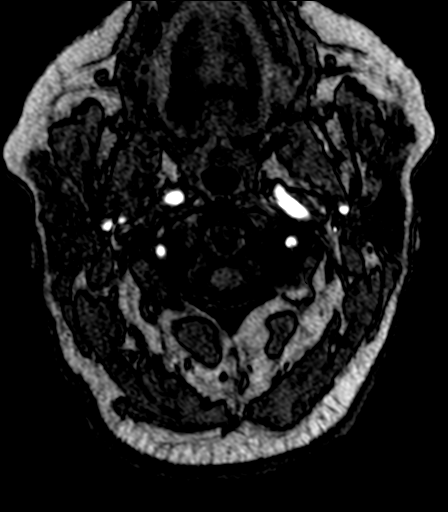
[im 72/162]
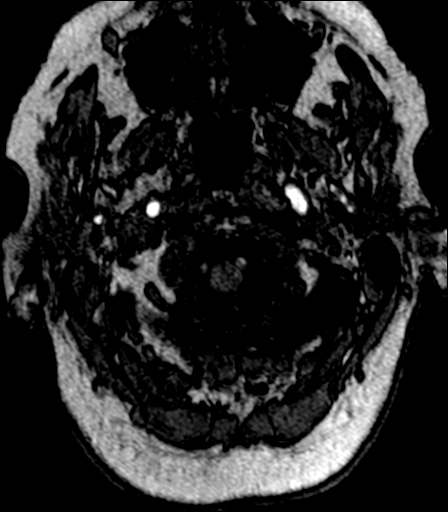
[im 83/162]
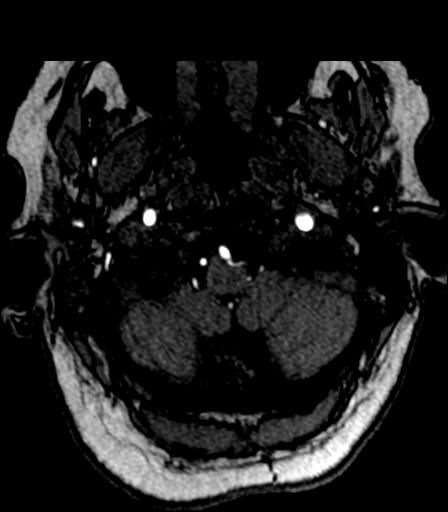
[im 93/162]
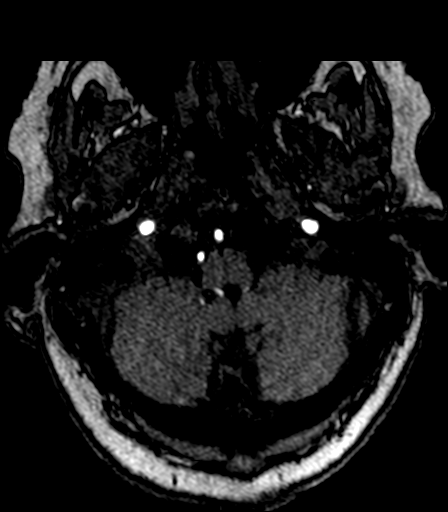
[im 114/162]
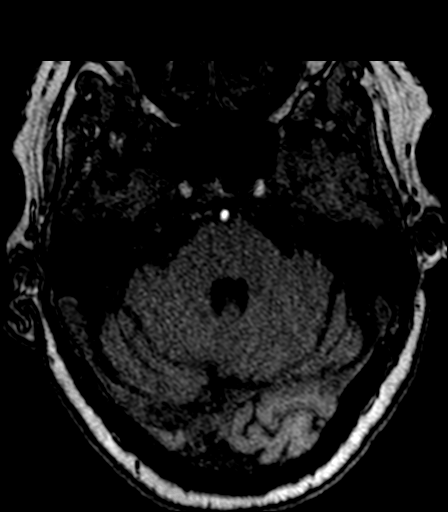
[im 134/162]
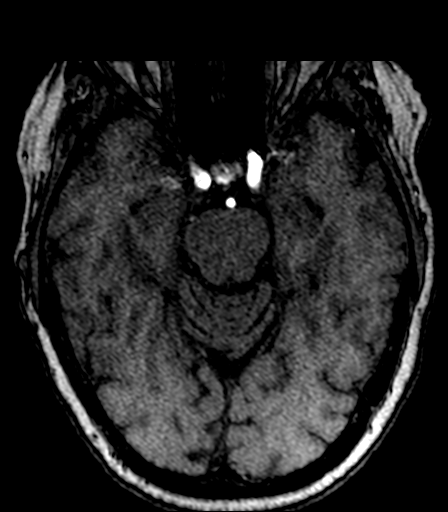
[im 138/162]
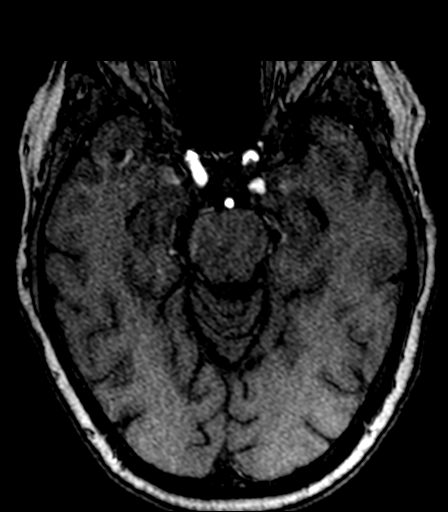
[im 155/162]
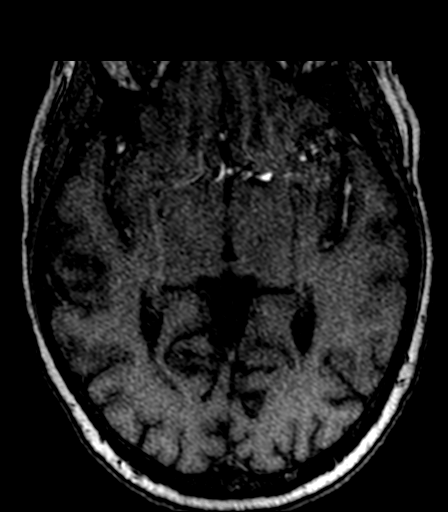

[22 of 48 positions shown; findings below may reference images not displayed]

FINDINGS: MRI HEAD FINDINGS

Brain: No acute infarction, hydrocephalus, extra-axial collection or
mass lesion.

Mild white matter changes with few small white matter
hyperintensities bilaterally. Chronic microhemorrhage left frontal
white matter.

Vascular: Normal arterial flow voids at the skull base

Skull and upper cervical spine: No focal abnormality.

Sinuses/Orbits: Paranasal sinuses clear. Bilateral cataract
extraction

Other: None

MRA HEAD FINDINGS

Anterior circulation: Internal carotid artery widely patent
bilaterally. Anterior and middle cerebral arteries patent without
stenosis or large vessel occlusion.

5.5 mm aneurysm left posterior communicating artery region.

Posterior circulation: Both vertebral arteries patent to the
basilar. Areas of loss of signal related to artifact below the skull
base. Basilar patent. Posterior cerebral arteries patent
bilaterally. Irregularity of the left could be artifact or due to
atherosclerotic disease.

Anatomic variants: None
IMPRESSION: 1. Negative for acute infarct. Mild chronic microvascular ischemic
change. Solitary chronic microhemorrhage focus left frontal white
matter. Correlate with risk factors for small vessel disease.
2. 5.5 mm left posterior communicating artery aneurysm.
3. Irregularity left posterior cerebral artery which could be
artifact or due to atherosclerotic stenosis.

## 2022-02-23 ENCOUNTER — Telehealth: Payer: Self-pay | Admitting: Family Medicine

## 2022-02-23 ENCOUNTER — Ambulatory Visit (HOSPITAL_BASED_OUTPATIENT_CLINIC_OR_DEPARTMENT_OTHER)
Admission: RE | Admit: 2022-02-23 | Discharge: 2022-02-23 | Disposition: A | Discharge status: Home / Self Care | Payer: Medicare HMO | Source: Ambulatory Visit | Attending: Family Medicine | Admitting: Family Medicine

## 2022-02-23 DIAGNOSIS — K8020 Calculus of gallbladder without cholecystitis without obstruction: Secondary | ICD-10-CM | POA: Diagnosis not present

## 2022-02-23 DIAGNOSIS — R7401 Elevation of levels of liver transaminase levels: Secondary | ICD-10-CM | POA: Diagnosis not present

## 2022-02-23 DIAGNOSIS — I671 Cerebral aneurysm, nonruptured: Secondary | ICD-10-CM

## 2022-02-23 IMAGING — US US ABDOMEN LIMITED
2 series · 14 of 25 positions shown · non-contrast
Comparison: None.

CLINICAL DATA: Transaminitis

EXAM:
ULTRASOUND ABDOMEN LIMITED RIGHT UPPER QUADRANT

[Series 1: us abdomen limited · 13 of 48 slices shown (1 of 2)]
[im 1/48]
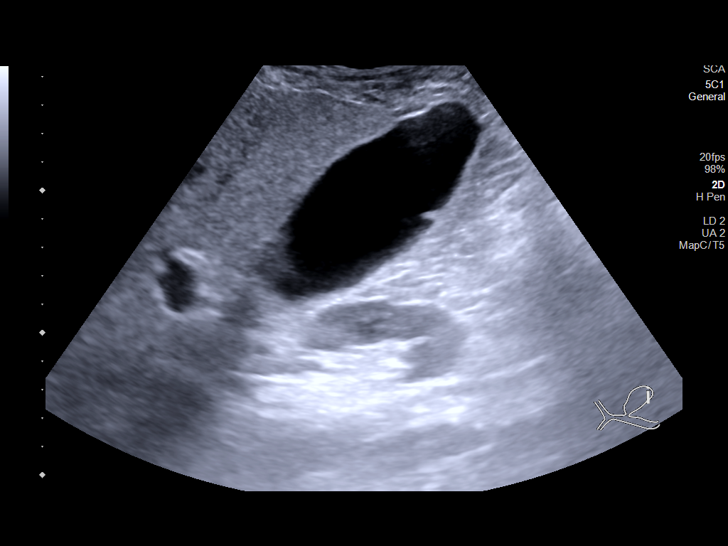
[im 5/48]
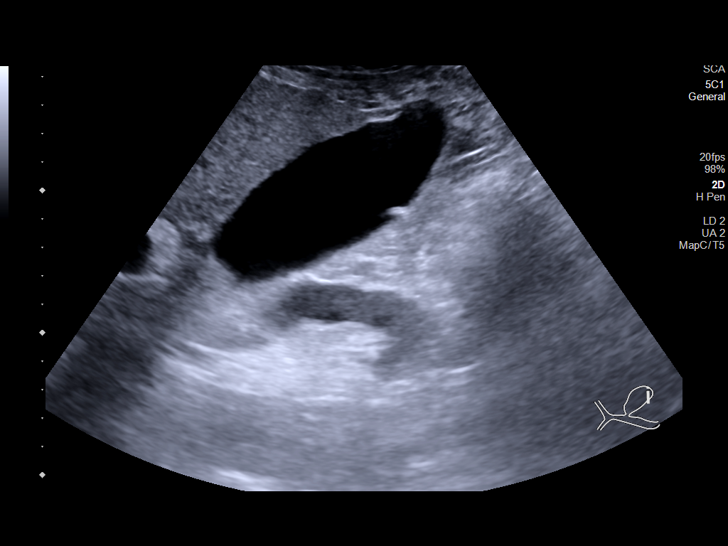
[im 9/48]
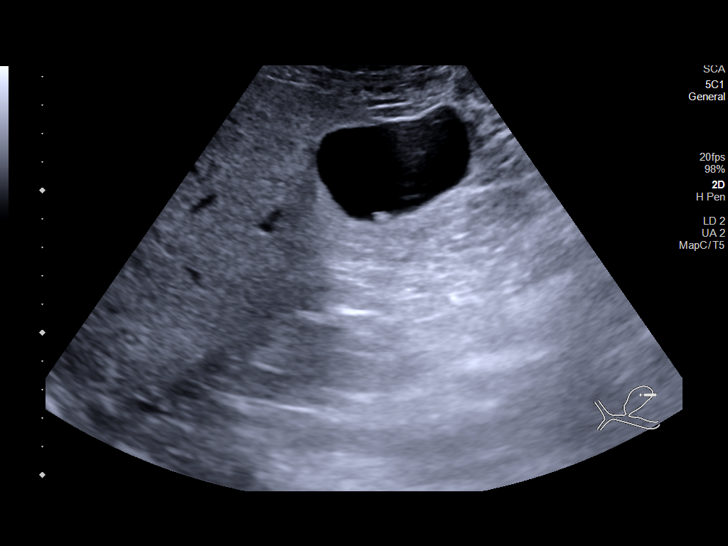
[im 13/48]
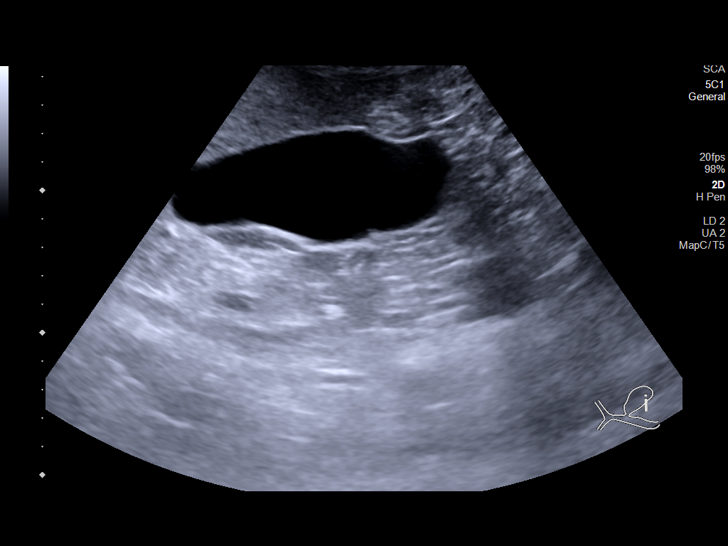
[im 17/48]
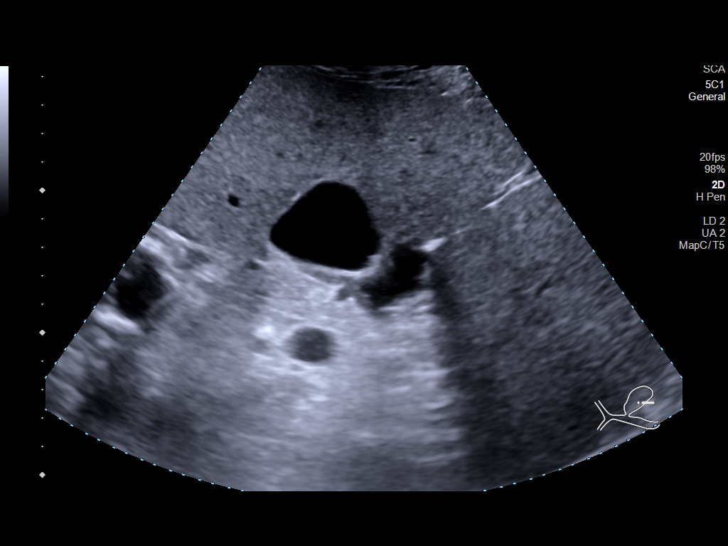
[im 19/48]
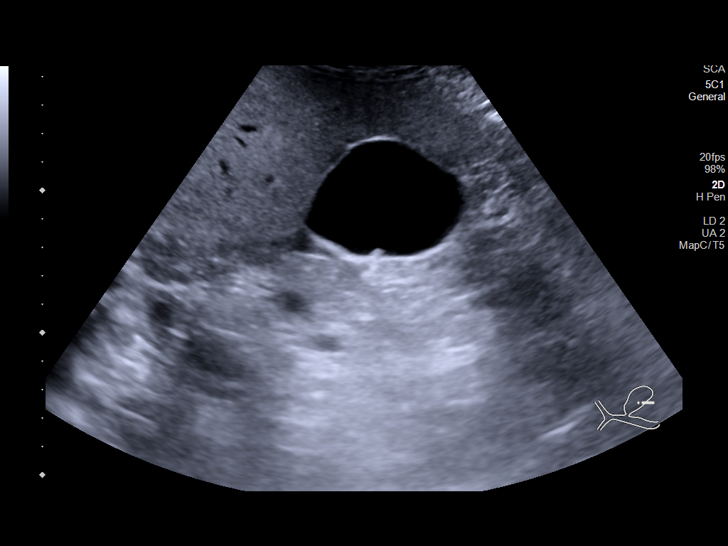
[im 23/48]
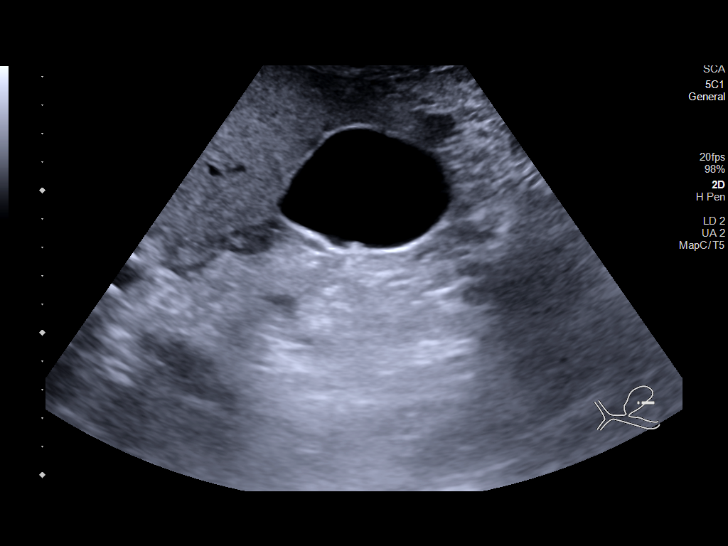
[im 27/48]
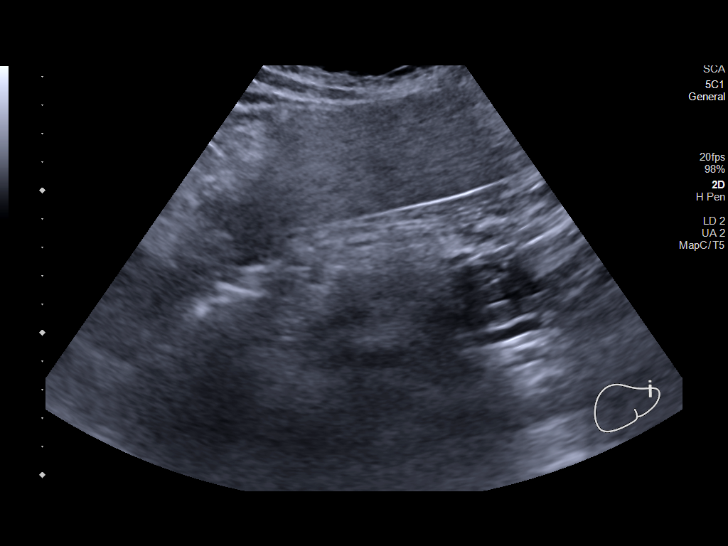
[im 31/48]
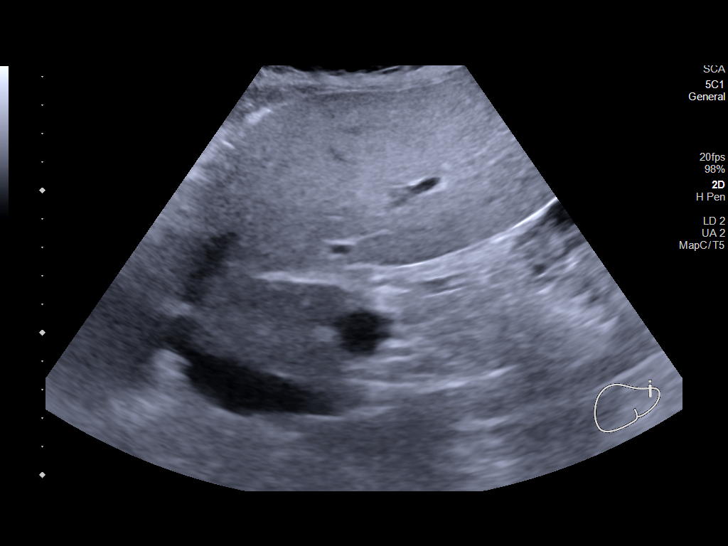
[im 33/48]
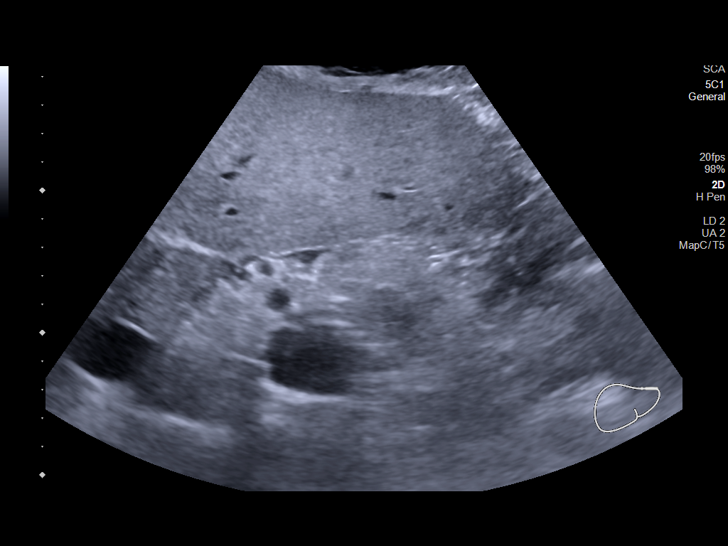
[im 37/48]
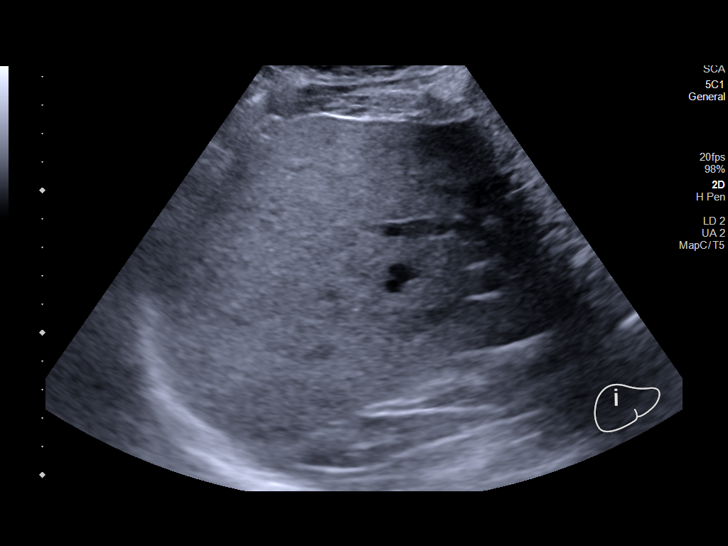
[im 41/48]
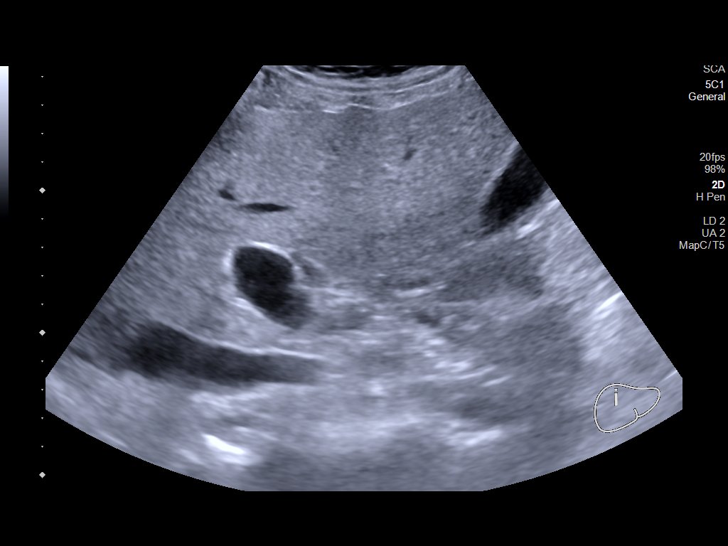
[im 45/48]
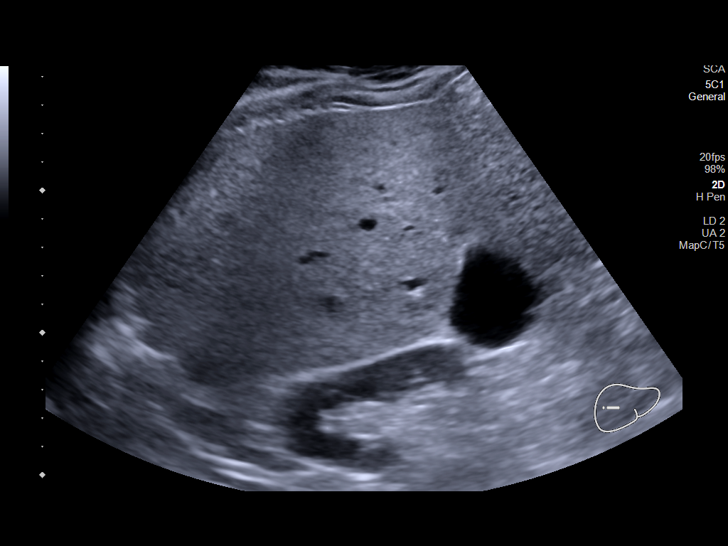

[Series 2: us abdomen limited · 1 of 1 slices shown (2 of 2)]
[im 1/1]
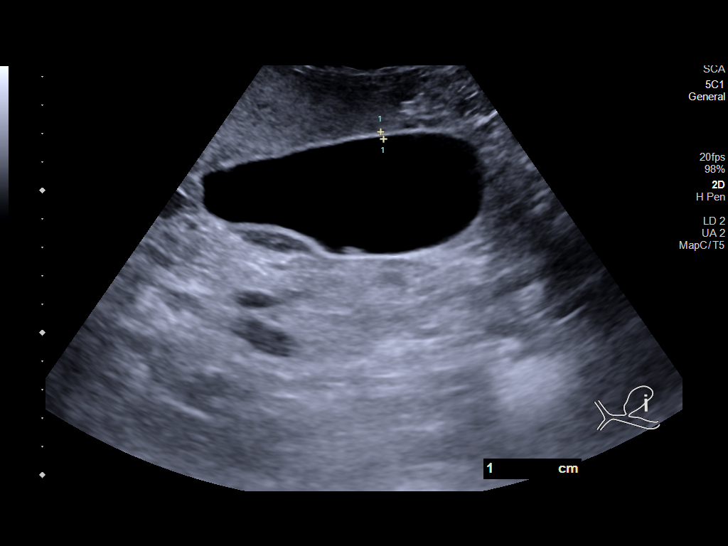

[14 of 25 positions shown; findings below may reference images not displayed]

FINDINGS: Gallbladder:

There is a 7.3 mm stone in the gallbladder. No gallbladder wall
thickening, sludge, pericholecystic fluid, or Murphy's sign.

Common bile duct:

Diameter: 4.4 mm

Liver:

Increased echogenicity. No focal mass. Portal vein is patent on
color Doppler imaging with normal direction of blood flow towards
the liver.

Other: None.
IMPRESSION: 1. A small amount of cholelithiasis is identified with the largest
stone measuring 7.3 mm. The gallbladder is otherwise normal.
2. Probable hepatic steatosis.

## 2022-02-23 NOTE — Telephone Encounter (Signed)
Received patient MRI/MRI report, consulted with reading radiologist.  He recommends referral to neurosurgery for possible treatment of aneurysm ?Called patient to discuss ?I had to leave message on machine for now, let her know I am making a referral for a nonemergency aneurysm evaluation ? ?MR Angiogram Head Wo Contrast ? ?Result Date: 02/22/2022 ?CLINICAL DATA:  Dizziness. Rule out CVA. History of breast cancer two thousand EXAM: MRI HEAD WITHOUT CONTRAST MRA HEAD WITHOUT CONTRAST TECHNIQUE: Multiplanar, multi-echo pulse sequences of the brain and surrounding structures were acquired without intravenous contrast. Angiographic images of the Circle of Willis were acquired using MRA technique without intravenous contrast. COMPARISON:  No pertinent prior exam. FINDINGS: MRI HEAD FINDINGS Brain: No acute infarction, hydrocephalus, extra-axial collection or mass lesion. Mild white matter changes with few small white matter hyperintensities bilaterally. Chronic microhemorrhage left frontal white matter. Vascular: Normal arterial flow voids at the skull base Skull and upper cervical spine: No focal abnormality. Sinuses/Orbits: Paranasal sinuses clear. Bilateral cataract extraction Other: None MRA HEAD FINDINGS Anterior circulation: Internal carotid artery widely patent bilaterally. Anterior and middle cerebral arteries patent without stenosis or large vessel occlusion. 5.5 mm aneurysm left posterior communicating artery region. Posterior circulation: Both vertebral arteries patent to the basilar. Areas of loss of signal related to artifact below the skull base. Basilar patent. Posterior cerebral arteries patent bilaterally. Irregularity of the left could be artifact or due to atherosclerotic disease. Anatomic variants: None IMPRESSION: 1. Negative for acute infarct. Mild chronic microvascular ischemic change. Solitary chronic microhemorrhage focus left frontal white matter. Correlate with risk factors for small vessel  disease. 2. 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 3. Irregularity left posterior cerebral artery which could be artifact or due to atherosclerotic stenosis. Electronically Signed   By: Franchot Gallo M.D.   On: 02/22/2022 15:20  ? ?MR Brain Wo Contrast ? ?Result Date: 02/22/2022 ?CLINICAL DATA:  Dizziness. Rule out CVA. History of breast cancer two thousand EXAM: MRI HEAD WITHOUT CONTRAST MRA HEAD WITHOUT CONTRAST TECHNIQUE: Multiplanar, multi-echo pulse sequences of the brain and surrounding structures were acquired without intravenous contrast. Angiographic images of the Circle of Willis were acquired using MRA technique without intravenous contrast. COMPARISON:  No pertinent prior exam. FINDINGS: MRI HEAD FINDINGS Brain: No acute infarction, hydrocephalus, extra-axial collection or mass lesion. Mild white matter changes with few small white matter hyperintensities bilaterally. Chronic microhemorrhage left frontal white matter. Vascular: Normal arterial flow voids at the skull base Skull and upper cervical spine: No focal abnormality. Sinuses/Orbits: Paranasal sinuses clear. Bilateral cataract extraction Other: None MRA HEAD FINDINGS Anterior circulation: Internal carotid artery widely patent bilaterally. Anterior and middle cerebral arteries patent without stenosis or large vessel occlusion. 5.5 mm aneurysm left posterior communicating artery region. Posterior circulation: Both vertebral arteries patent to the basilar. Areas of loss of signal related to artifact below the skull base. Basilar patent. Posterior cerebral arteries patent bilaterally. Irregularity of the left could be artifact or due to atherosclerotic disease. Anatomic variants: None IMPRESSION: 1. Negative for acute infarct. Mild chronic microvascular ischemic change. Solitary chronic microhemorrhage focus left frontal white matter. Correlate with risk factors for small vessel disease. 2. 5.5 mm left posterior communicating artery  aneurysm. 3. Irregularity left posterior cerebral artery which could be artifact or due to atherosclerotic stenosis. Electronically Signed   By: Franchot Gallo M.D.   On: 02/22/2022 15:20   ? ?

## 2022-02-24 ENCOUNTER — Telehealth: Payer: Self-pay | Admitting: Family Medicine

## 2022-02-24 NOTE — Telephone Encounter (Signed)
Called patient to discuss her recent MRI report.  She notes her dizzy symptoms have entirely resolved ? ?Went over chronic changes seen on MRI as well as aneurysm ?She has history of AAA status post repair in 2013, so she is familiar with the concept of aneurysm treatment and feels comfortable with neurosurgery referral ? ?I will also mail her a copy of her MRI for her records ? ?

## 2022-02-28 ENCOUNTER — Encounter: Payer: Self-pay | Admitting: Family Medicine

## 2022-02-28 NOTE — Progress Notes (Signed)
You have small gallstones in the gallbladder.  This is not uncommon, and no particular action is necessary unless you start having gallbladder pain.  Typically gallbladder pain presents as discomfort in the right upper abdomen, most frequently after eating.  If you do start to notice this please let me know right away! ? ?You do have some fat in the liver as we suspected-this is likely why your liver function tests are mildly elevated ?

## 2022-03-02 ENCOUNTER — Telehealth: Payer: Self-pay | Admitting: Family Medicine

## 2022-03-02 NOTE — Telephone Encounter (Signed)
Left message for patient to call back and schedule Medicare Annual Wellness Visit (AWV) in office.  ? ?If not able to come in office, please offer to do virtually or by telephone.  Left office number and my jabber [phone]. ? ?Last AWV:03/16/2020 ? ?Please schedule at anytime with Nurse Health Advisor. ?  ?

## 2022-03-02 NOTE — Telephone Encounter (Signed)
Patient states she does not want to set one up.  ?

## 2022-03-13 ENCOUNTER — Other Ambulatory Visit: Payer: Self-pay | Admitting: Family Medicine

## 2022-03-13 DIAGNOSIS — E118 Type 2 diabetes mellitus with unspecified complications: Secondary | ICD-10-CM

## 2022-03-15 DIAGNOSIS — Z6830 Body mass index (BMI) 30.0-30.9, adult: Secondary | ICD-10-CM | POA: Diagnosis not present

## 2022-03-15 DIAGNOSIS — I671 Cerebral aneurysm, nonruptured: Secondary | ICD-10-CM | POA: Diagnosis not present

## 2022-04-03 DIAGNOSIS — Z6830 Body mass index (BMI) 30.0-30.9, adult: Secondary | ICD-10-CM | POA: Diagnosis not present

## 2022-04-03 DIAGNOSIS — I671 Cerebral aneurysm, nonruptured: Secondary | ICD-10-CM | POA: Diagnosis not present

## 2022-04-04 ENCOUNTER — Other Ambulatory Visit: Payer: Self-pay | Admitting: Neurosurgery

## 2022-04-04 DIAGNOSIS — I671 Cerebral aneurysm, nonruptured: Secondary | ICD-10-CM

## 2022-04-10 ENCOUNTER — Other Ambulatory Visit: Payer: Self-pay

## 2022-04-10 DIAGNOSIS — I10 Essential (primary) hypertension: Secondary | ICD-10-CM

## 2022-04-10 MED ORDER — LOSARTAN POTASSIUM 50 MG PO TABS: 50.0000 mg | ORAL_TABLET | Freq: Every day | ORAL | 0 refills | Status: DC

## 2022-04-11 ENCOUNTER — Other Ambulatory Visit: Payer: Self-pay | Admitting: Neurosurgery

## 2022-04-11 ENCOUNTER — Ambulatory Visit (HOSPITAL_COMMUNITY)
Admission: RE | Admit: 2022-04-11 | Discharge: 2022-04-11 | Disposition: A | Discharge status: Home / Self Care | Payer: Medicare HMO | Source: Ambulatory Visit | Attending: Neurosurgery | Admitting: Neurosurgery

## 2022-04-11 ENCOUNTER — Other Ambulatory Visit: Payer: Self-pay

## 2022-04-11 DIAGNOSIS — I671 Cerebral aneurysm, nonruptured: Secondary | ICD-10-CM

## 2022-04-11 DIAGNOSIS — I509 Heart failure, unspecified: Secondary | ICD-10-CM | POA: Insufficient documentation

## 2022-04-11 DIAGNOSIS — E1122 Type 2 diabetes mellitus with diabetic chronic kidney disease: Secondary | ICD-10-CM | POA: Insufficient documentation

## 2022-04-11 DIAGNOSIS — Z7984 Long term (current) use of oral hypoglycemic drugs: Secondary | ICD-10-CM | POA: Insufficient documentation

## 2022-04-11 DIAGNOSIS — Z7902 Long term (current) use of antithrombotics/antiplatelets: Secondary | ICD-10-CM | POA: Diagnosis not present

## 2022-04-11 DIAGNOSIS — I130 Hypertensive heart and chronic kidney disease with heart failure and stage 1 through stage 4 chronic kidney disease, or unspecified chronic kidney disease: Secondary | ICD-10-CM | POA: Diagnosis not present

## 2022-04-11 DIAGNOSIS — Z794 Long term (current) use of insulin: Secondary | ICD-10-CM | POA: Insufficient documentation

## 2022-04-11 DIAGNOSIS — Z87891 Personal history of nicotine dependence: Secondary | ICD-10-CM | POA: Diagnosis not present

## 2022-04-11 DIAGNOSIS — N189 Chronic kidney disease, unspecified: Secondary | ICD-10-CM | POA: Diagnosis not present

## 2022-04-11 HISTORY — PX: IR ANGIO VERTEBRAL SEL VERTEBRAL UNI R MOD SED: IMG5368

## 2022-04-11 HISTORY — PX: IR ANGIO INTRA EXTRACRAN SEL INTERNAL CAROTID UNI L MOD SED: IMG5361

## 2022-04-11 HISTORY — PX: IR US GUIDE VASC ACCESS RIGHT: IMG2390

## 2022-04-11 LAB — CBC WITH DIFFERENTIAL/PLATELET
Abs Immature Granulocytes: 0.08 10*3/uL — ABNORMAL HIGH (ref 0.00–0.07)
Basophils Absolute: 0.1 10*3/uL (ref 0.0–0.1)
Basophils Relative: 1 %
Eosinophils Absolute: 0.4 10*3/uL (ref 0.0–0.5)
Eosinophils Relative: 6 %
HCT: 36.5 % (ref 36.0–46.0)
Hemoglobin: 12.3 g/dL (ref 12.0–15.0)
Immature Granulocytes: 1 %
Lymphocytes Relative: 51 %
Lymphs Abs: 3 10*3/uL (ref 0.7–4.0)
MCH: 29.1 pg (ref 26.0–34.0)
MCHC: 33.7 g/dL (ref 30.0–36.0)
MCV: 86.5 fL (ref 80.0–100.0)
Monocytes Absolute: 0.5 10*3/uL (ref 0.1–1.0)
Monocytes Relative: 9 %
Neutro Abs: 1.9 10*3/uL (ref 1.7–7.7)
Neutrophils Relative %: 32 %
Platelets: 233 10*3/uL (ref 150–400)
RBC: 4.22 MIL/uL (ref 3.87–5.11)
RDW: 15.9 % — ABNORMAL HIGH (ref 11.5–15.5)
WBC: 5.9 10*3/uL (ref 4.0–10.5)
nRBC: 0 % (ref 0.0–0.2)

## 2022-04-11 LAB — BASIC METABOLIC PANEL
Anion gap: 12 (ref 5–15)
BUN: 21 mg/dL (ref 8–23)
CO2: 19 mmol/L — ABNORMAL LOW (ref 22–32)
Calcium: 9.6 mg/dL (ref 8.9–10.3)
Chloride: 107 mmol/L (ref 98–111)
Creatinine, Ser: 1.48 mg/dL — ABNORMAL HIGH (ref 0.44–1.00)
GFR, Estimated: 36 mL/min — ABNORMAL LOW (ref >60)
Glucose, Bld: 179 mg/dL — ABNORMAL HIGH (ref 70–99)
Potassium: 4.8 mmol/L (ref 3.5–5.1)
Sodium: 138 mmol/L (ref 135–145)

## 2022-04-11 LAB — GLUCOSE, CAPILLARY: Glucose-Capillary: 182 mg/dL — ABNORMAL HIGH (ref 70–99)

## 2022-04-11 LAB — PROTIME-INR
INR: 1.2 (ref 0.8–1.2)
Prothrombin Time: 14.6 seconds (ref 11.4–15.2)

## 2022-04-11 IMAGING — XA IR CAROTID  INTERNAL HEAD/NECK  LEFT (MS)
7 series · 13 of 24 positions shown · IV contrast (IODINE)
Comparison: none

PROCEDURE:
DIAGNOSTIC CEREBRAL ANGIOGRAM
HISTORY: The patient is a 75-year-old woman initially seen in the Outpatient
neurosurgery clinic with incidental discovery of a left posterior
communicating artery aneurysm after MRI and MRA done for complaint
of dizziness. Patient does wish to proceed with diagnostic cerebral
angiogram.
TECHNIQUE: CATHETERS AND WIRES
5-French [V6] glide catheter

[Series 1: n roadmap · 2 of 39 frames shown]
[frame 6/39]
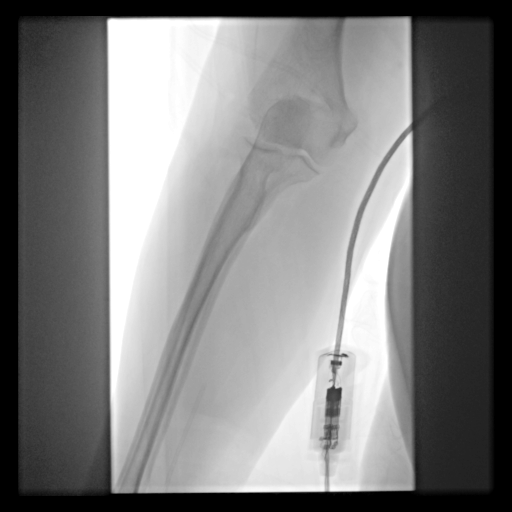
[frame 38/39]
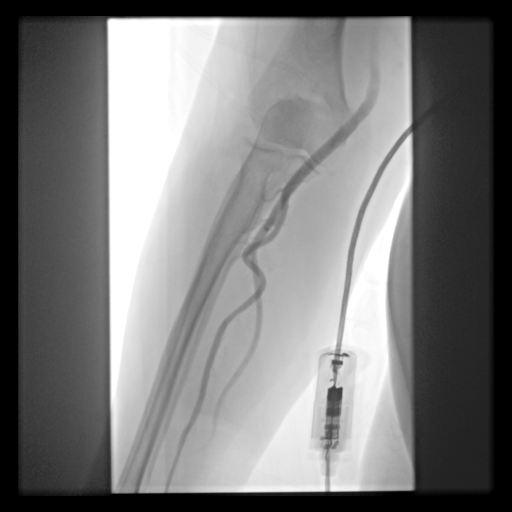

[Series 2: cerebral care 2 · 2 acquisitions, 1 frame shown (1 of 5)]
[im 1/2]
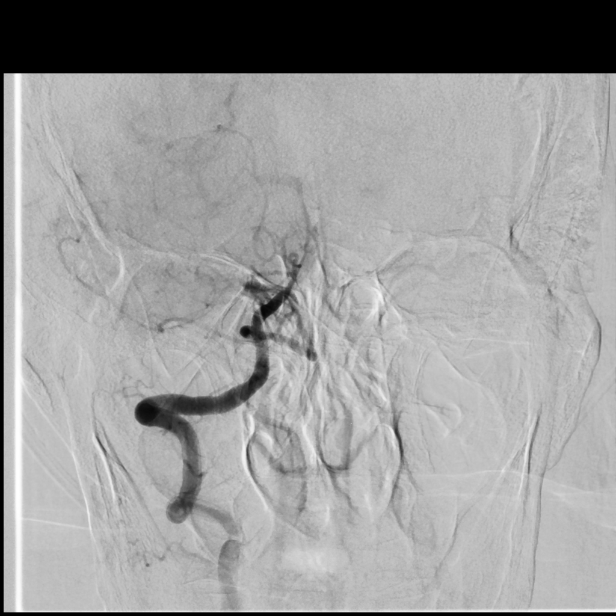

[Series 3: cerebral care 2 · 2 acquisitions, 1 frame shown (2 of 5)]
[im 1/2]
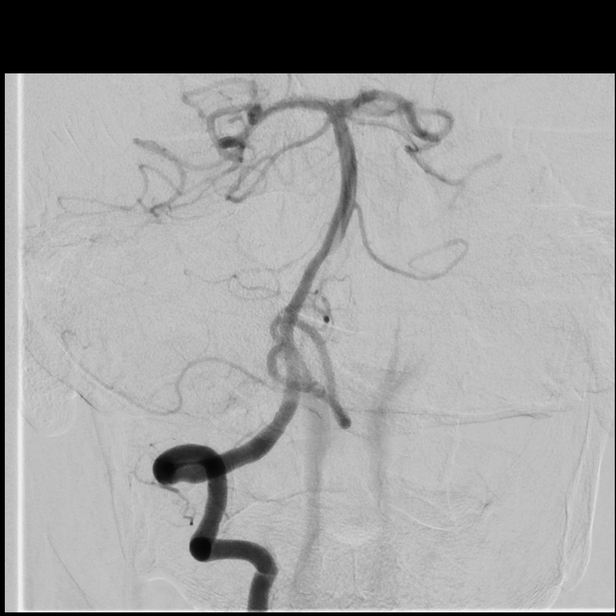

[Series 4: cerebral care 2 · 2 acquisitions, 1 frame shown (3 of 5)]
[im 1/2]
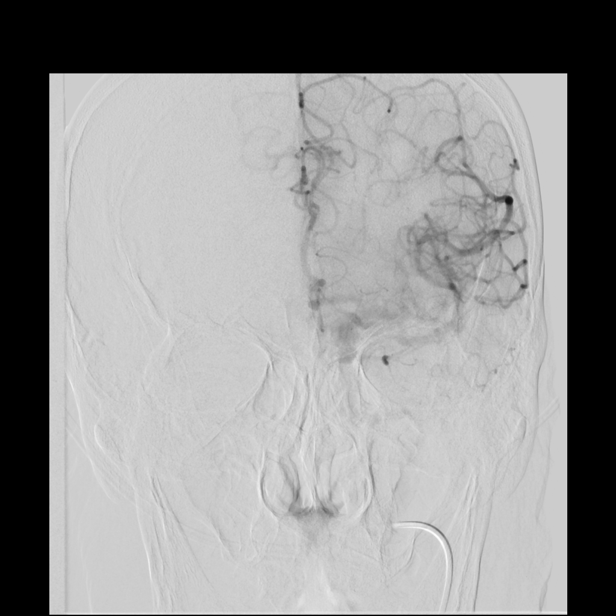

[Series 5: cerebral care 2 · 2 acquisitions, 1 frame shown (4 of 5)]
[im 1/2]
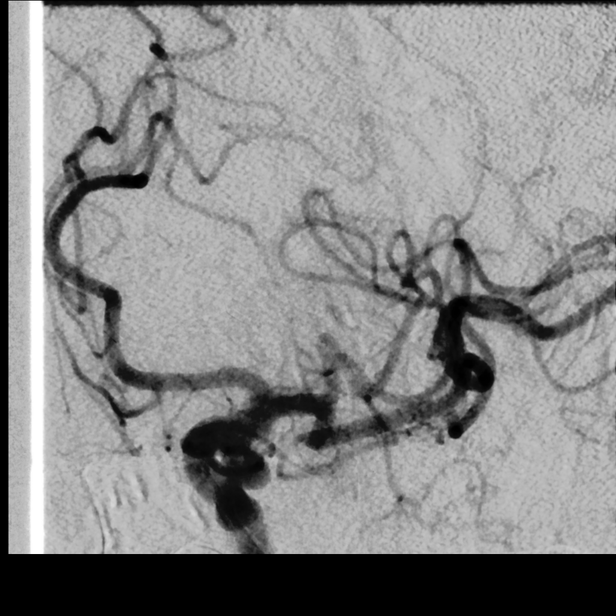

[Series 6: cerebral care 2 · 2 acquisitions, 2 frames shown (5 of 5)]
[im 1/2]
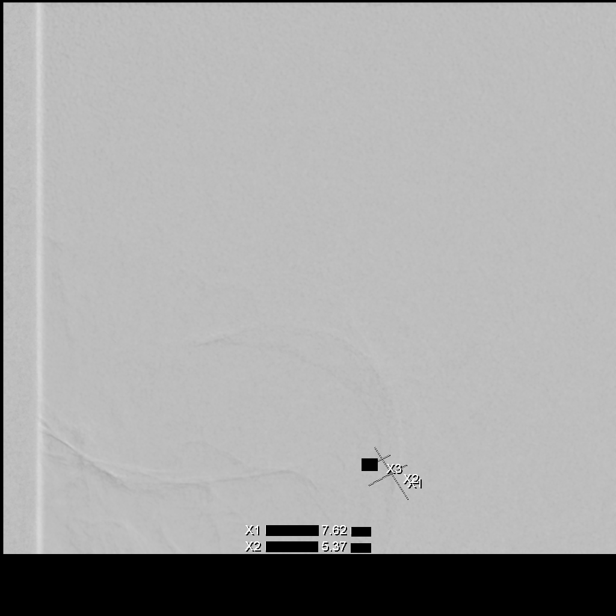
[im 1/2]
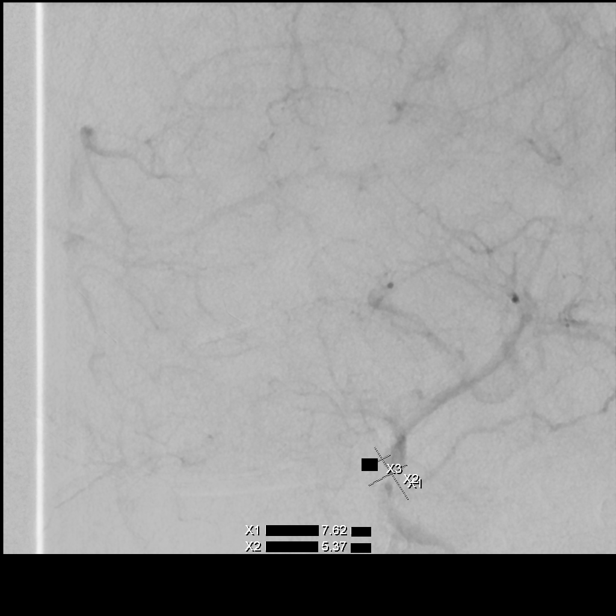

[Series 300: ir angio vertebral sel vertebral bilat m · 5 of 30 slices shown]
[im 4/30]
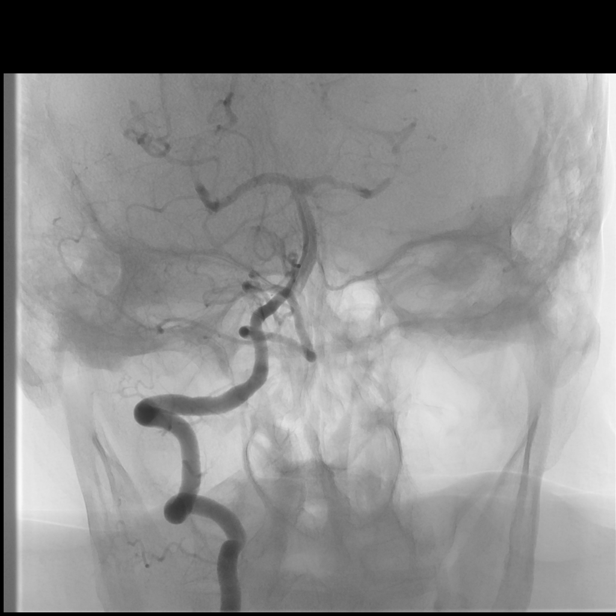
[im 11/30]
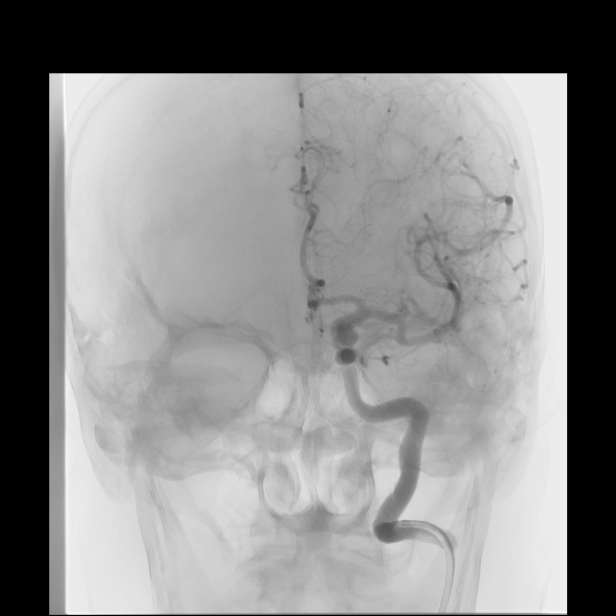
[im 17/30]
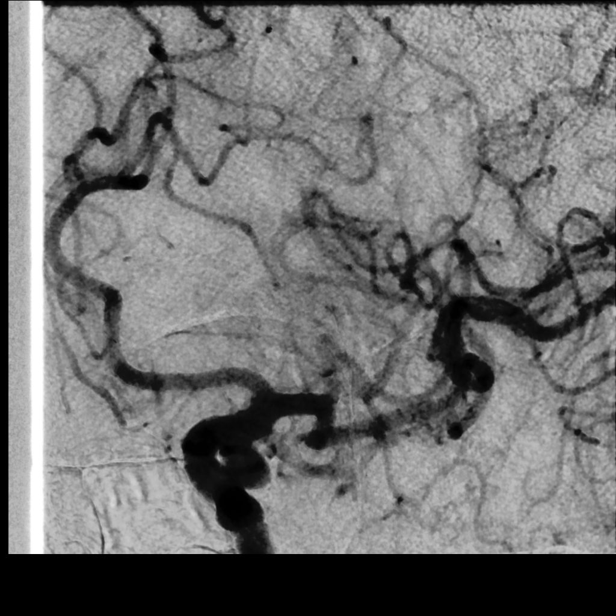
[im 24/30]
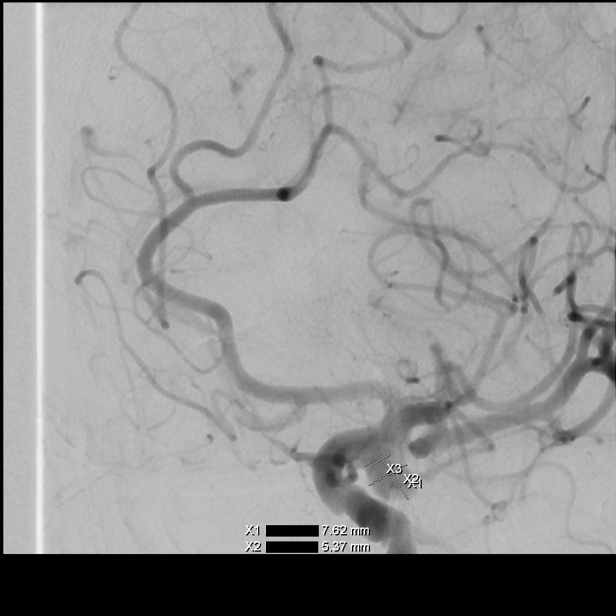
[im 30/30]
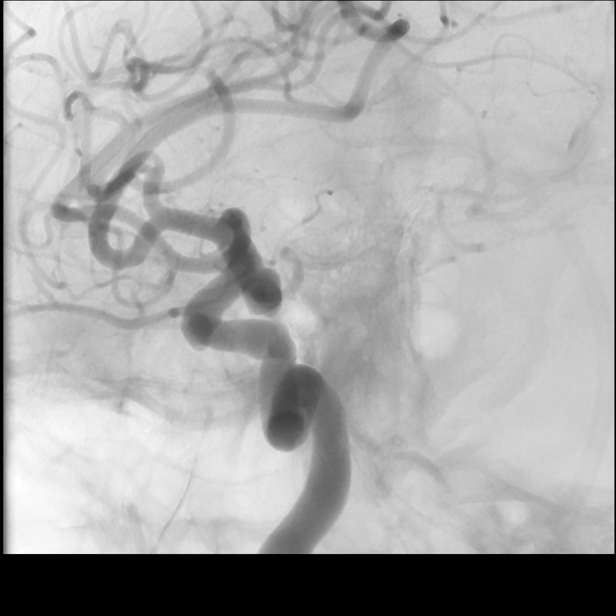

[13 of 24 positions shown; findings below may reference images not displayed]

ACCESS:
The technical aspects of the procedure as well as its potential
risks and benefits were reviewed with the patient. These risks
included but were not limited bleeding, infection, allergic
reaction, damage to organs or vital structures, stroke,
non-diagnostic procedure, and the catastrophic outcomes of heart
attack, coma, and death. With an understanding of these risks,
informed consent was obtained and witnessed. The patient was placed
in the supine position on the angiography table and the skin of
right groin prepped in the usual sterile fashion.

The procedure was performed under local anesthesia (1%-solution of
bicarbonate-buffered Lidocaine) and conscious sedation with 0.5mg
versed monitored by myself and the in-suite nurse using continuous
pulse-oximetry, heart rate, and non-invasive blood-pressure.

A 5- French sheath was introduced in the right radial artery under
ultrasound guidance using Seldinger technique in order to directly
visualize endoluminal access to the right radial artery with a micro
puncture needle. A fluoro-phase sequence was used to document the
sheath position.

MEDICATIONS:
Heparin: [V6] Units total.

Verapamil: 2.5mg

Nitroglycerin: [V6]

CONTRAST:  55mL OMNIPAQUE IOHEXOL 300 MG/ML  SOLNcc, Omnipaque 300

FLUOROSCOPY TIME:  FLUOROSCOPY TIME: See IR records
0.035" glidewire

VESSELS CATHETERIZED
Left internal carotid

Right vertebral

Right radial

VESSELS STUDIED
Left internal carotid, head

Right vertebral

Right radial

PROCEDURAL NARRATIVE
A 5-STREET catheter was advanced over a 0.035 glidewire into the
aortic arch. The above vessels were then sequentially catheterized
and cervical / cerebral angiograms taken. Of note, there is
significant calcific atherosclerotic disease involving the aortic
arch and proximal great vessels. After multiple attempts at
catheterization of the right internal carotid artery, I elected to
complete the procedure after catheterization of the right vertebral
and left internal. After review of images, the catheter was removed
without incident.
FINDINGS: Left internal carotid: head:

Injection reveals the presence of a widely patent ICA, A1, and M1
segments and their branches. There is a inferiorly and posteriorly
directed aneurysm arising in relation to the origin of the left
posterior communicating artery. Aneurysm measures approximately
x 5.4 mm with an approximately 3.5 mm neck. The parenchymal and
venous phases are normal. The venous sinuses are widely patent.

Right vertebral:

Injection reveals the presence of a widely patent vertebral artery.
This leads to a widely patent basilar artery that terminates in
bilateral P1. The basilar apex is normal. No aneurysms, AVMs, or
high-flow fistulas are seen. The parenchymal and venous phases are
normal. The venous sinuses are widely patent.

Right radial:

Normal vessel. No significant atherosclerotic disease. Arterial
sheath in adequate position.

DISPOSITION:
Upon completion of the study, the radial sheath was removed and
patent hemostasis was obtained with application of the Terumo TR
band. The procedure was well tolerated and no early complications
were observed. The patient was transferred to the holding area for
further care and removal of the TR band.
IMPRESSION: 1. Approximately 7.5 mm aneurysm of the left posterior communicating
artery, as described above.

2. Significant calcific atherosclerotic disease involving the aortic
arch with cobblestone appearance.

The preliminary results of this procedure were shared with the
patient and the patient's family.

## 2022-04-11 MED ORDER — VERAPAMIL HCL 2.5 MG/ML IV SOLN
INTRAVENOUS | Status: AC
  Filled 2022-04-11: qty 2

## 2022-04-11 MED ORDER — CEFAZOLIN SODIUM-DEXTROSE 2-4 GM/100ML-% IV SOLN: 2.0000 g | INTRAVENOUS | Status: DC

## 2022-04-11 MED ORDER — NITROGLYCERIN 1 MG/10 ML FOR IR/CATH LAB
INTRA_ARTERIAL | Status: AC
  Filled 2022-04-11: qty 10

## 2022-04-11 MED ORDER — NITROGLYCERIN 1 MG/10 ML FOR IR/CATH LAB
INTRA_ARTERIAL | Status: AC | PRN
  Administered 2022-04-11: 100 ug via INTRA_ARTERIAL

## 2022-04-11 MED ORDER — VERAPAMIL HCL 2.5 MG/ML IV SOLN
INTRAVENOUS | Status: AC | PRN
  Administered 2022-04-11: 2.5 mg via INTRA_ARTERIAL

## 2022-04-11 MED ORDER — MIDAZOLAM HCL 2 MG/2ML IJ SOLN
INTRAMUSCULAR | Status: AC | PRN
  Administered 2022-04-11: .5 mg via INTRAVENOUS

## 2022-04-11 MED ORDER — SODIUM CHLORIDE 0.9 % IV SOLN: INTRAVENOUS | Status: DC

## 2022-04-11 MED ORDER — IOHEXOL 300 MG/ML  SOLN
100.0000 mL | Freq: Once | INTRAMUSCULAR | Status: AC | PRN
  Administered 2022-04-11: 55 mL via INTRA_ARTERIAL

## 2022-04-11 MED ORDER — HEPARIN SODIUM (PORCINE) 1000 UNIT/ML IJ SOLN
INTRAMUSCULAR | Status: AC
  Filled 2022-04-11: qty 10

## 2022-04-11 MED ORDER — LIDOCAINE HCL 1 % IJ SOLN
INTRAMUSCULAR | Status: AC
  Filled 2022-04-11: qty 20

## 2022-04-11 MED ORDER — HEPARIN SODIUM (PORCINE) 1000 UNIT/ML IJ SOLN
INTRAMUSCULAR | Status: AC | PRN
  Administered 2022-04-11: 3000 [IU] via INTRAVENOUS

## 2022-04-11 MED ORDER — MIDAZOLAM HCL 2 MG/2ML IJ SOLN
INTRAMUSCULAR | Status: AC
  Filled 2022-04-11: qty 2

## 2022-04-11 MED ORDER — LIDOCAINE HCL (PF) 1 % IJ SOLN
INTRAMUSCULAR | Status: AC | PRN
  Administered 2022-04-11: 1 mL

## 2022-04-11 NOTE — Sedation Documentation (Signed)
Right radial TR band applied with 12cc of air at 11:05.  ?

## 2022-04-11 NOTE — H&P (Signed)
?Chief Complaint  ?Aneurysm ? ?History of Present Illness  ?Mandy Kim is a 76 y.o. female initially seen in the outpatient neurosurgery clinic after incidental discovery of a left posterior communicating artery aneurysm during work-up of dizziness.  Patient does wish to proceed with diagnostic cerebral angiogram for further work-up. ? ?Of note patient does have a history of medically controlled hypertension and history of abdominal aortic aneurysm treated with endovascular stent graft.  She is currently maintained on Plavix.  No known family history of intracranial aneurysms or unexplained intracranial hemorrhage.  She quit smoking 20 years ago. ? ?Past Medical History  ? ?Past Medical History:  ?Diagnosis Date  ? Anticoagulant long-term use   ? plavix  ? Cancer [facility])   ? CHF (congestive heart failure) ([place]) 2000  ? Chronic cystitis   ? Chronic kidney disease   ? Diverticulitis   ? GERD (gastroesophageal reflux disease)   ? Hiatal hernia   ? History of basal cell carcinoma excision   ? face/ nose right side  ? History of bilateral breast cancer followed by oncologist until 2008,  per pt no recurrence  ? right 2002  and left 2003 s/p  mastectomy with completed chemo and tamexifen therapy   ? History of cervical cancer 2000  ? s/p  TAH w/ BSO  ? History of diverticulitis of colon   ? Hyperlipidemia   ? OSA on CPAP   ? PAD (peripheral artery disease) ([place])   ? Peripheral neuropathy   ? PONV (postoperative nausea and vomiting)   ? S/P AAA (abdominal aortic aneurysm) repair 09/15/2011  ? Stress incontinence due to pelvic organ prolapse   ? Type 2 diabetes mellitus treated with insulin ([place])   ? followed by pcp  ? Wears glasses   ? ? ?Past Surgical History  ? ?Past Surgical History:  ?Procedure Laterality Date  ? ABDOMINAL HYSTERECTOMY  2000  ? w/  BSO,  and Bladder tacking abdominally  ? CARPAL TUNNEL RELEASE Left 1999  ? CATARACT EXTRACTION W/ INTRAOCULAR LENS  IMPLANT, BILATERAL  2015  ? ENDOVASCULAR  REPAIR/STENT GRAFT  09-15-2011    @[facility]   ? INCONTINENCE SURGERY  2007  approx.  ? MASTECTOMY Bilateral right 2002;  left 2003  ? lymph node dissection done only on right side  ? ROBOTIC ASSISTED LAPAROSCOPIC SACROCOLPOPEXY N/A 05/23/2019  ? Procedure: XI ROBOTIC ASSISTED LAPAROSCOPIC SACROCOLPOPEXY;  Surgeon: Ardis Hughs, MD;  Location: [facility];  Service: Urology;  Laterality: N/A;  ? TONSILLECTOMY  child  ? TYMPANOPLASTY Right 1970s  ? "had my ear drum replacement with a plastic one"  ? WRIST GANGLION EXCISION Left 1998  ? ? ?Social History  ? ?Social History  ? ?Tobacco Use  ? Smoking status: Former  ?  Years: 30.00  ?  Types: Cigarettes  ?  Quit date: 06/07/1999  ?  Years since quitting: 22.8  ? Smokeless tobacco: Never  ?Vaping Use  ? Vaping Use: Never used  ?Substance Use Topics  ? Alcohol use: Never  ? Drug use: Never  ? ? ?Medications  ? ?Prior to Admission medications   ?Medication Sig Start Date End Date Taking? Authorizing Provider  ?b complex vitamins tablet Take 0.5 tablets by mouth 2 (two) times daily.    Yes [provider]  ?Biotin 5 MG TABS Take 5,000 mcg by mouth at bedtime.    Yes [provider]  ?cephALEXin (KEFLEX) 250 MG capsule Take 1 capsule (250 mg total) by mouth daily. Daily  prophylaxis per urology 02/28/21  Yes Copland, Gay Filler, MD  ?clopidogrel (PLAVIX) 75 MG tablet TAKE 1 TABLET EVERY DAY 02/10/22  Yes Copland, Gay Filler, MD  ?Cranberry 500 MG TABS Take 500 mg by mouth daily.   Yes [provider]  ?gabapentin (NEURONTIN) 100 MG capsule TAKE 1 CAPSULE TWICE DAILY 05/03/21  Yes Copland, Gay Filler, MD  ?gemfibrozil (LOPID) 600 MG tablet TAKE 1 TABLET TWICE DAILY BEFORE MEALS 01/27/22  Yes Copland, Gay Filler, MD  ?glipiZIDE (GLUCOTROL) 5 MG tablet TAKE 1 TABLET TWICE DAILY BEFORE A MEAL 11/14/21  Yes Copland, Gay Filler, MD  ?levothyroxine (SYNTHROID) 50 MCG tablet Take 1 tablet (50 mcg total) by mouth daily before breakfast. 12/26/21  Yes Copland, Gay Filler, MD   ?losartan (COZAAR) 50 MG tablet Take 1 tablet (50 mg total) by mouth daily. 04/10/22  Yes Copland, Gay Filler, MD  ?metFORMIN (GLUCOPHAGE) 1000 MG tablet TAKE 1 TABLET TWICE DAILY WITH MEALS 03/13/22  Yes Copland, Gay Filler, MD  ?Multiple Vitamin (MULTIVITAMIN) tablet Take 0.5 tablets by mouth 2 (two) times daily.    Yes [provider]  ?niacin (NIASPAN) 500 MG CR tablet Take 1 tablet (500 mg total) by mouth at bedtime. 08/25/15  Yes Copland, Gay Filler, MD  ?Omega-3 Fatty Acids (FISH OIL) 1000 MG CAPS Take 1,000 mg by mouth 2 (two) times a day.    Yes [provider]  ?pantoprazole (PROTONIX) 40 MG tablet Take 1 tablet (40 mg total) by mouth daily. 01/02/22  Yes Copland, Gay Filler, MD  ?potassium chloride SA (KLOR-CON M) 20 MEQ tablet TAKE 1 TABLET EVERY DAY 02/07/22  Yes Copland, Gay Filler, MD  ?Probiotic Product (PROBIOTIC DAILY PO) Take 1 capsule by mouth daily.   Yes [provider]  ?VITAMIN D PO Take 1 capsule by mouth daily.    Yes [provider]  ?zinc gluconate 50 MG tablet Take 50 mg by mouth at bedtime.   Yes [provider]  ?Accu-Chek Softclix Lancets lancets TEST BLOOD SUGAR THREE TIMES DAILY 07/20/21   Copland, Gay Filler, MD  ?Alcohol Swabs (DROPSAFE ALCOHOL PREP) 70 % PADS USE TO TEST BLOOD SUGAR 3 TIMES DAILY. 07/20/21   Copland, Gay Filler, MD  ?Alcohol Swabs PADS Use to test blood sugar 3 times daily. Dx: E11.9 01/14/19   Copland, Gay Filler, MD  ?Blood Glucose Calibration (GLUCOSE CONTROL) SOLN Use to test blood sugar 3 times daily. Dx: E11.9 12/20/15   Copland, Gay Filler, MD  ?Blood Glucose Monitoring Suppl (ACCU-CHEK AVIVA PLUS) w/Device KIT USE AS DIRECTED  TO TEST BLOOD GLUCOSE BID E11.9 05/11/21   Copland, Gay Filler, MD  ?glucose blood (ACCU-CHEK AVIVA PLUS) test strip TEST BLOOD SUGAR THREE TIMES DAILY 05/25/21   Copland, Gay Filler, MD  ?Insulin Pen Needle 32G X 4 MM MISC Used to inject insulin 1x daily. 10/18/17   Renato Shin, MD  ?Insulin Syringe-Needle  U-100 (INSULIN SYRINGE 1CC/30GX5/16") 30G X 5/16" 1 ML MISC Use to inject insulin 1 time per day. 05/22/17   Renato Shin, MD  ?magnesium oxide (MAG-OX) 400 MG tablet Take 200 mg by mouth daily.    [provider]  ?Menthol, Topical Analgesic, (BIOFREEZE ROLL-ON EX) Apply 1 application topically as needed (joint pain).    [provider]  ?TRESIBA FLEXTOUCH 100 UNIT/ML FlexTouch Pen INJECT  60 UNITS EVERY DAY AT BEDTIME 11/14/21   Copland, Gay Filler, MD  ? ? ?Allergies  ? ?Allergies  ?Allergen Reactions  ? Aspirin   ?  Per pt any pain medications - causes fluid build up   ? Bee Venom Swelling  ? Ibuprofen Swelling  ? Ozempic (0.25 Or 0.5 Mg-Dose) [Semaglutide(0.25 Or 0.22m-Dos)] Diarrhea and Nausea And Vomiting  ?  Flatulence  ? Poison Oak Extract Itching and Swelling  ? Statins   ?  "skin feels like bugs under the skin"  ? Trulicity [Dulaglutide] Nausea And Vomiting and Other (See Comments)  ?  Flatulance, "deathly sick"  ? Tylenol [Acetaminophen] Swelling  ? Victoza [Liraglutide] Other (See Comments)  ?  Stomach upset issues   ? ? ?Review of Systems  ?ROS ? ?Neurologic Exam  ?Awake, alert, oriented ?Memory and concentration grossly intact ?Speech fluent, appropriate ?CN grossly intact ?Motor exam: ?Upper Extremities Deltoid Bicep Tricep Grip  ?Right 5/5 5/5 5/5 5/5  ?Left 5/5 5/5 5/5 5/5  ? ?Lower Extremities IP Quad PF DF EHL  ?Right 5/5 5/5 5/5 5/5 5/5  ?Left 5/5 5/5 5/5 5/5 5/5  ? ?Sensation grossly intact to LT ? ?Imaging  ?MRA of the brain was personally reviewed and demonstrates an inferiorly projecting aneurysm of the supraclinoid left internal carotid artery measuring approximately 8 mm in maximal dimension. ? ?Impression  ?-- 76y.o. female with incidental discovery of 8 mm left posterior communicating artery aneurysm during work-up of dizziness. ? ?Plan  ?-We will plan on proceeding with diagnostic cerebral angiogram for further characterization of the aneurysm ? ?I have reviewed the  details of the procedure as well as the expected postoperative course and recovery at length with the patient in the office.  We have discussed the associated risks, benefits, and alternatives to the angiogram.  All

## 2022-04-11 NOTE — Progress Notes (Signed)
On arrival to radiology patient has on pink restricted extremity band on right arm due to mastectomy in the past. Dr. Kathyrn Sheriff made aware of patient right arm extremity restriction from previous mastectomy. Physician states the arm restriction only relates to venous access and doesn't include arterial access; so therefore it will be okay to safely proceed with utilizing right radial artery for cerebral arteriogram. Patient states understanding and aware that right arm/artery will be used for procedure and agreeable to proceed with procedure without any concerns. ?

## 2022-04-11 NOTE — Brief Op Note (Signed)
?  NEUROSURGERY BRIEF OPERATIVE  NOTE  ? ?PREOP DX: Aneurysm ? ?POSTOP DX: Same ? ?PROCEDURE: Diagnostic cerebral angiogram ? ?SURGEON: Dr. Consuella Lose, MD ? ?ANESTHESIA: IV Sedation with Local ? ?EBL: Minimal ? ?SPECIMENS: None ? ?COMPLICATIONS: None ? ?CONDITION: Stable to recovery ? ?FINDINGS (Full report in [facility]): ?1. Approx. 7.24m left Pcom aneurysm ?2. Significant calcific atherosclerotic disease of the aorta and proximal great vessels ? ? ?NConsuella Lose MD ?[facility]  ?' ?

## 2022-04-27 DIAGNOSIS — Z6831 Body mass index (BMI) 31.0-31.9, adult: Secondary | ICD-10-CM | POA: Diagnosis not present

## 2022-04-27 DIAGNOSIS — I671 Cerebral aneurysm, nonruptured: Secondary | ICD-10-CM | POA: Diagnosis not present

## 2022-05-03 ENCOUNTER — Other Ambulatory Visit: Payer: Self-pay

## 2022-05-03 ENCOUNTER — Ambulatory Visit (HOSPITAL_COMMUNITY)
Admission: EM | Admit: 2022-05-03 | Discharge: 2022-05-03 | Disposition: A | Discharge status: Home / Self Care | Payer: Medicare HMO

## 2022-05-03 ENCOUNTER — Inpatient Hospital Stay (HOSPITAL_COMMUNITY)
Admission: EM | Admit: 2022-05-03 | Discharge: 2022-05-07 | DRG: 189 | Disposition: A | Discharge status: Home / Self Care | Payer: Medicare HMO | Attending: Internal Medicine | Admitting: Internal Medicine

## 2022-05-03 ENCOUNTER — Encounter (HOSPITAL_COMMUNITY): Payer: Self-pay | Admitting: Emergency Medicine

## 2022-05-03 ENCOUNTER — Telehealth: Payer: Self-pay | Admitting: Family Medicine

## 2022-05-03 DIAGNOSIS — R197 Diarrhea, unspecified: Secondary | ICD-10-CM | POA: Diagnosis not present

## 2022-05-03 DIAGNOSIS — E1151 Type 2 diabetes mellitus with diabetic peripheral angiopathy without gangrene: Secondary | ICD-10-CM | POA: Diagnosis present

## 2022-05-03 DIAGNOSIS — N179 Acute kidney failure, unspecified: Secondary | ICD-10-CM | POA: Diagnosis present

## 2022-05-03 DIAGNOSIS — R109 Unspecified abdominal pain: Secondary | ICD-10-CM

## 2022-05-03 DIAGNOSIS — A0811 Acute gastroenteropathy due to Norwalk agent: Secondary | ICD-10-CM | POA: Diagnosis present

## 2022-05-03 DIAGNOSIS — Z792 Long term (current) use of antibiotics: Secondary | ICD-10-CM | POA: Diagnosis not present

## 2022-05-03 DIAGNOSIS — Z794 Long term (current) use of insulin: Secondary | ICD-10-CM | POA: Diagnosis not present

## 2022-05-03 DIAGNOSIS — E039 Hypothyroidism, unspecified: Secondary | ICD-10-CM | POA: Diagnosis present

## 2022-05-03 DIAGNOSIS — E1169 Type 2 diabetes mellitus with other specified complication: Secondary | ICD-10-CM | POA: Diagnosis present

## 2022-05-03 DIAGNOSIS — Z79810 Long term (current) use of selective estrogen receptor modulators (SERMs): Secondary | ICD-10-CM

## 2022-05-03 DIAGNOSIS — J439 Emphysema, unspecified: Secondary | ICD-10-CM | POA: Diagnosis not present

## 2022-05-03 DIAGNOSIS — Z20822 Contact with and (suspected) exposure to covid-19: Secondary | ICD-10-CM | POA: Diagnosis not present

## 2022-05-03 DIAGNOSIS — J9811 Atelectasis: Secondary | ICD-10-CM | POA: Diagnosis not present

## 2022-05-03 DIAGNOSIS — R195 Other fecal abnormalities: Secondary | ICD-10-CM | POA: Diagnosis not present

## 2022-05-03 DIAGNOSIS — G4733 Obstructive sleep apnea (adult) (pediatric): Secondary | ICD-10-CM | POA: Diagnosis present

## 2022-05-03 DIAGNOSIS — R1032 Left lower quadrant pain: Secondary | ICD-10-CM | POA: Diagnosis not present

## 2022-05-03 DIAGNOSIS — E1122 Type 2 diabetes mellitus with diabetic chronic kidney disease: Secondary | ICD-10-CM | POA: Diagnosis present

## 2022-05-03 DIAGNOSIS — Z66 Do not resuscitate: Secondary | ICD-10-CM | POA: Diagnosis present

## 2022-05-03 DIAGNOSIS — E782 Mixed hyperlipidemia: Secondary | ICD-10-CM | POA: Diagnosis present

## 2022-05-03 DIAGNOSIS — K219 Gastro-esophageal reflux disease without esophagitis: Secondary | ICD-10-CM | POA: Diagnosis present

## 2022-05-03 DIAGNOSIS — Z853 Personal history of malignant neoplasm of breast: Secondary | ICD-10-CM | POA: Diagnosis not present

## 2022-05-03 DIAGNOSIS — I129 Hypertensive chronic kidney disease with stage 1 through stage 4 chronic kidney disease, or unspecified chronic kidney disease: Secondary | ICD-10-CM | POA: Diagnosis not present

## 2022-05-03 DIAGNOSIS — A045 Campylobacter enteritis: Secondary | ICD-10-CM | POA: Diagnosis present

## 2022-05-03 DIAGNOSIS — N1832 Chronic kidney disease, stage 3b: Secondary | ICD-10-CM | POA: Diagnosis present

## 2022-05-03 DIAGNOSIS — E1142 Type 2 diabetes mellitus with diabetic polyneuropathy: Secondary | ICD-10-CM | POA: Diagnosis present

## 2022-05-03 DIAGNOSIS — J9601 Acute respiratory failure with hypoxia: Secondary | ICD-10-CM | POA: Diagnosis not present

## 2022-05-03 DIAGNOSIS — A0472 Enterocolitis due to Clostridium difficile, not specified as recurrent: Secondary | ICD-10-CM | POA: Diagnosis not present

## 2022-05-03 DIAGNOSIS — K921 Melena: Secondary | ICD-10-CM

## 2022-05-03 DIAGNOSIS — E860 Dehydration: Secondary | ICD-10-CM | POA: Diagnosis not present

## 2022-05-03 DIAGNOSIS — R079 Chest pain, unspecified: Secondary | ICD-10-CM | POA: Diagnosis not present

## 2022-05-03 DIAGNOSIS — I10 Essential (primary) hypertension: Secondary | ICD-10-CM | POA: Diagnosis present

## 2022-05-03 DIAGNOSIS — R0902 Hypoxemia: Principal | ICD-10-CM | POA: Diagnosis present

## 2022-05-03 DIAGNOSIS — R911 Solitary pulmonary nodule: Secondary | ICD-10-CM | POA: Diagnosis not present

## 2022-05-03 DIAGNOSIS — R000 Tachycardia, unspecified: Secondary | ICD-10-CM | POA: Diagnosis not present

## 2022-05-03 LAB — CBC WITH DIFFERENTIAL/PLATELET
Abs Immature Granulocytes: 0.1 10*3/uL — ABNORMAL HIGH (ref 0.00–0.07)
Band Neutrophils: 3 %
Basophils Absolute: 0 10*3/uL (ref 0.0–0.1)
Basophils Relative: 0 %
Eosinophils Absolute: 0.5 10*3/uL (ref 0.0–0.5)
Eosinophils Relative: 6 %
HCT: 34.5 % — ABNORMAL LOW (ref 36.0–46.0)
Hemoglobin: 11.2 g/dL — ABNORMAL LOW (ref 12.0–15.0)
Lymphocytes Relative: 46 %
Lymphs Abs: 3.5 10*3/uL (ref 0.7–4.0)
MCH: 28.5 pg (ref 26.0–34.0)
MCHC: 32.5 g/dL (ref 30.0–36.0)
MCV: 87.8 fL (ref 80.0–100.0)
Monocytes Absolute: 0.7 10*3/uL (ref 0.1–1.0)
Monocytes Relative: 9 %
Myelocytes: 1 %
Neutro Abs: 2.9 10*3/uL (ref 1.7–7.7)
Neutrophils Relative %: 35 %
Platelets: 276 10*3/uL (ref 150–400)
RBC: 3.93 MIL/uL (ref 3.87–5.11)
RDW: 16.1 % — ABNORMAL HIGH (ref 11.5–15.5)
WBC: 7.5 10*3/uL (ref 4.0–10.5)
nRBC: 0.3 % — ABNORMAL HIGH (ref 0.0–0.2)

## 2022-05-03 LAB — LIPASE, BLOOD: Lipase: 78 U/L — ABNORMAL HIGH (ref 11–51)

## 2022-05-03 LAB — COMPREHENSIVE METABOLIC PANEL
ALT: 42 U/L (ref 0–44)
AST: 49 U/L — ABNORMAL HIGH (ref 15–41)
Albumin: 3.8 g/dL (ref 3.5–5.0)
Alkaline Phosphatase: 37 U/L — ABNORMAL LOW (ref 38–126)
Anion gap: 12 (ref 5–15)
BUN: 33 mg/dL — ABNORMAL HIGH (ref 8–23)
CO2: 20 mmol/L — ABNORMAL LOW (ref 22–32)
Calcium: 9.2 mg/dL (ref 8.9–10.3)
Chloride: 104 mmol/L (ref 98–111)
Creatinine, Ser: 1.89 mg/dL — ABNORMAL HIGH (ref 0.44–1.00)
GFR, Estimated: 27 mL/min — ABNORMAL LOW (ref >60)
Glucose, Bld: 115 mg/dL — ABNORMAL HIGH (ref 70–99)
Potassium: 3.9 mmol/L (ref 3.5–5.1)
Sodium: 136 mmol/L (ref 135–145)
Total Bilirubin: 0.6 mg/dL (ref 0.3–1.2)
Total Protein: 7.7 g/dL (ref 6.5–8.1)

## 2022-05-03 NOTE — ED Notes (Signed)
Patient is being discharged from the Urgent Care and sent to the Emergency Department via POV . Per Sharyn Lull NP, patient is in need of higher level of care to rule out GI bleed. Patient is aware and verbalizes understanding of plan of care.  Vitals:   05/03/22 1906  BP: (!) 106/44  Pulse: 87  Resp: 18  Temp: 99.5 F (37.5 C)  SpO2: 93%

## 2022-05-03 NOTE — ED Provider Notes (Signed)
[facility]    CSN: [id number] Arrival date & time: 05/03/22  1729      History   Chief Complaint Chief Complaint  Patient presents with   Diarrhea   Abdominal Pain    HPI Mandy Kim is a 76 y.o. female.   Patient presents to urgent care for evaluation of diarrhea, weakness, fatigue, and fever/chills for the last 4-5 days. She states that 3 days ago, her stools turned dark. She has not been taking peptobismol for her symptom   Diarrhea Associated symptoms: abdominal pain   Abdominal Pain Associated symptoms: diarrhea    Past Medical History:  Diagnosis Date   Anticoagulant long-term use    plavix   Cancer (HCC)    CHF (congestive heart failure) ([place]) 2000   Chronic cystitis    Chronic kidney disease    Diverticulitis    GERD (gastroesophageal reflux disease)    Hiatal hernia    History of basal cell carcinoma excision    face/ nose right side   History of bilateral breast cancer followed by oncologist until 2008,  per pt no recurrence   right 2002  and left 2003 s/p  mastectomy with completed chemo and tamexifen therapy    History of cervical cancer 2000   s/p  TAH w/ BSO   History of diverticulitis of colon    Hyperlipidemia    OSA on CPAP    PAD (peripheral artery disease) (HCC)    Peripheral neuropathy    PONV (postoperative nausea and vomiting)    S/P AAA (abdominal aortic aneurysm) repair 09/15/2011   Stress incontinence due to pelvic organ prolapse    Type 2 diabetes mellitus treated with insulin ([place])    followed by pcp   Wears glasses     Patient Active Problem List   Diagnosis Date Noted   Myositis, unspecified 04/27/2020   Prolapse of female pelvic organs 05/24/2019   Cystocele with prolapse 05/23/2019   Hypoxia    Sepsis ([place]) 05/28/2017   Elevated LFTs 05/28/2017   Acute lower UTI 05/28/2017   Dehydration 05/28/2017   Peripheral neuropathy 07/27/2014   AAA (abdominal aortic aneurysm) ([place]) 02/12/2012   Fatty liver  02/12/2012   Breast cancer ([place]) 02/12/2012   Cervical cancer ([place]) 02/12/2012   Diabetes mellitus ([place]) 02/12/2012   Hyperlipidemia 02/12/2012    Past Surgical History:  Procedure Laterality Date   ABDOMINAL HYSTERECTOMY  2000   w/  BSO,  and Bladder tacking abdominally   CARPAL TUNNEL RELEASE Left 1999   CATARACT EXTRACTION W/ INTRAOCULAR LENS  IMPLANT, BILATERAL  2015   ENDOVASCULAR REPAIR/STENT GRAFT  09-15-2011    @[facility]    INCONTINENCE SURGERY  2007  approx.   IR ANGIO INTRA EXTRACRAN SEL INTERNAL CAROTID UNI L MOD SED  04/11/2022   IR ANGIO VERTEBRAL SEL VERTEBRAL UNI R MOD SED  04/11/2022   IR US GUIDE VASC ACCESS RIGHT  04/11/2022   MASTECTOMY Bilateral right 2002;  left 2003   lymph node dissection done only on right side   ROBOTIC ASSISTED LAPAROSCOPIC SACROCOLPOPEXY N/A 05/23/2019   Procedure: XI ROBOTIC ASSISTED LAPAROSCOPIC SACROCOLPOPEXY;  Surgeon: Ardis Hughs, MD;  Location: [facility];  Service: Urology;  Laterality: N/A;   TONSILLECTOMY  child   TYMPANOPLASTY Right 1970s   "had my ear drum replacement with a plastic one"   WRIST GANGLION EXCISION Left 1998    OB History   No obstetric history on file.      Home Medications  Prior to Admission medications   Medication Sig Start Date End Date Taking? Authorizing Provider  Accu-Chek Softclix Lancets lancets TEST BLOOD SUGAR THREE TIMES DAILY 07/20/21   Copland, Gay Filler, MD  Alcohol Swabs (DROPSAFE ALCOHOL PREP) 70 % PADS USE TO TEST BLOOD SUGAR 3 TIMES DAILY. 07/20/21   Copland, Gay Filler, MD  Alcohol Swabs PADS Use to test blood sugar 3 times daily. Dx: E11.9 01/14/19   Copland, Gay Filler, MD  b complex vitamins tablet Take 0.5 tablets by mouth 2 (two) times daily.     [provider]  Biotin 5 MG TABS Take 5,000 mcg by mouth at bedtime.     [provider]  Blood Glucose Calibration (GLUCOSE CONTROL) SOLN Use to test blood sugar 3 times daily. Dx: E11.9 12/20/15   Copland, Gay Filler, MD   Blood Glucose Monitoring Suppl (ACCU-CHEK AVIVA PLUS) w/Device KIT USE AS DIRECTED  TO TEST BLOOD GLUCOSE BID E11.9 05/11/21   Copland, Gay Filler, MD  cephALEXin (KEFLEX) 250 MG capsule Take 1 capsule (250 mg total) by mouth daily. Daily prophylaxis per urology 02/28/21   Copland, Gay Filler, MD  clopidogrel (PLAVIX) 75 MG tablet TAKE 1 TABLET EVERY DAY 02/10/22   Copland, Gay Filler, MD  Cranberry 500 MG TABS Take 500 mg by mouth daily.    [provider]  gabapentin (NEURONTIN) 100 MG capsule TAKE 1 CAPSULE TWICE DAILY 05/03/21   Copland, Gay Filler, MD  gemfibrozil (LOPID) 600 MG tablet TAKE 1 TABLET TWICE DAILY BEFORE MEALS 01/27/22   Copland, Gay Filler, MD  glipiZIDE (GLUCOTROL) 5 MG tablet TAKE 1 TABLET TWICE DAILY BEFORE A MEAL 11/14/21   Copland, Gay Filler, MD  glucose blood (ACCU-CHEK AVIVA PLUS) test strip TEST BLOOD SUGAR THREE TIMES DAILY 05/25/21   Copland, Gay Filler, MD  Insulin Pen Needle 32G X 4 MM MISC Used to inject insulin 1x daily. 10/18/17   Renato Shin, MD  Insulin Syringe-Needle U-100 (INSULIN SYRINGE 1CC/30GX5/16") 30G X 5/16" 1 ML MISC Use to inject insulin 1 time per day. 05/22/17   Renato Shin, MD  levothyroxine (SYNTHROID) 50 MCG tablet Take 1 tablet (50 mcg total) by mouth daily before breakfast. 12/26/21   Copland, Gay Filler, MD  losartan (COZAAR) 50 MG tablet Take 1 tablet (50 mg total) by mouth daily. 04/10/22   Copland, Gay Filler, MD  magnesium oxide (MAG-OX) 400 MG tablet Take 200 mg by mouth daily.    [provider]  Menthol, Topical Analgesic, (BIOFREEZE ROLL-ON EX) Apply 1 application topically as needed (joint pain).    [provider]  metFORMIN (GLUCOPHAGE) 1000 MG tablet TAKE 1 TABLET TWICE DAILY WITH MEALS 03/13/22   Copland, Gay Filler, MD  Multiple Vitamin (MULTIVITAMIN) tablet Take 0.5 tablets by mouth 2 (two) times daily.     [provider]  niacin (NIASPAN) 500 MG CR tablet Take 1 tablet (500 mg total) by mouth at bedtime.  08/25/15   Copland, Gay Filler, MD  Omega-3 Fatty Acids (FISH OIL) 1000 MG CAPS Take 1,000 mg by mouth 2 (two) times a day.     [provider]  pantoprazole (PROTONIX) 40 MG tablet Take 1 tablet (40 mg total) by mouth daily. 01/02/22   Copland, Gay Filler, MD  potassium chloride SA (KLOR-CON M) 20 MEQ tablet TAKE 1 TABLET EVERY DAY 02/07/22   Copland, Gay Filler, MD  Probiotic Product (PROBIOTIC DAILY PO) Take 1 capsule by mouth daily.    [provider]  [facility]  FLEXTOUCH 100 UNIT/ML FlexTouch Pen INJECT  60 UNITS EVERY DAY AT BEDTIME 11/14/21   Copland, Gay Filler, MD  VITAMIN D PO Take 1 capsule by mouth daily.     [provider]  zinc gluconate 50 MG tablet Take 50 mg by mouth at bedtime.    [provider]    Family History Family History  Problem Relation Age of Onset   Cancer Mother    Pneumonia Mother    Thyroid disease Mother    Thyroid disease Sister    Obesity Daughter    Hypertension Daughter    Thyroid disease Son    Pneumonia Maternal Grandmother    Heart attack Maternal Grandfather    Diabetes Neg Hx     Social History Social History   Tobacco Use   Smoking status: Former    Years: 30.00    Types: Cigarettes    Quit date: 06/07/1999    Years since quitting: 22.9   Smokeless tobacco: Never  Vaping Use   Vaping Use: Never used  Substance Use Topics   Alcohol use: Never   Drug use: Never     Allergies   Aspirin, Bee venom, Ibuprofen, Ozempic (0.25 or 0.5 mg-dose) [semaglutide(0.25 or 0.52m-dos)], Poison oak extract, Statins, Trulicity [dulaglutide], Tylenol [acetaminophen], and Victoza [liraglutide]   Review of Systems Review of Systems  Gastrointestinal:  Positive for abdominal pain and diarrhea.    Physical Exam Triage Vital Signs ED Triage Vitals  Enc Vitals Group     BP 05/03/22 1906 (!) 106/44     Pulse Rate 05/03/22 1906 87     Resp 05/03/22 1906 18     Temp 05/03/22 1906 99.5 F (37.5 C)     Temp src --       SpO2 05/03/22 1906 93 %     Weight --      Height --      Head Circumference --      Peak Flow --      Pain Score 05/03/22 1904 7     Pain Loc --      Pain Edu? --      Excl. in [place] --    No data found.  Updated Vital Signs BP (!) 106/44   Pulse 87   Temp 99.5 F (37.5 C)   Resp 18   SpO2 93%   Visual Acuity Right Eye Distance:   Left Eye Distance:   Bilateral Distance:    Right Eye Near:   Left Eye Near:    Bilateral Near:     Physical Exam   UC Treatments / Results  Labs (all labs ordered are listed, but only abnormal results are displayed) Labs Reviewed - No data to display  EKG   Radiology No results found.  Procedures Procedures (including critical care time)  Medications Ordered in UC Medications - No data to display  Initial Impression / Assessment and Plan / UC Course  I have reviewed the triage vital signs and the nursing notes.  Pertinent labs & imaging results that were available during my care of the patient were reviewed by me and considered in my medical decision making (see chart for details).     *** Final Clinical Impressions(s) / UC Diagnoses   Final diagnoses:  None   Discharge Instructions   None    ED Prescriptions   None    PDMP not reviewed this encounter.

## 2022-05-03 NOTE — ED Notes (Signed)
Pt RA SpO2 88%, pt placed on 2L O2 via nasal cannula. Pt does not wear O2 at baseline.

## 2022-05-03 NOTE — ED Triage Notes (Signed)
Pt here for diarrhea x4-5 days, w/ abd pain when she eats or drinks and constant nausea. Pt denies vomiting. States there is blood in her stool.

## 2022-05-03 NOTE — ED Provider Triage Note (Signed)
Emergency Medicine Provider Triage Evaluation Note  Mandy Kim , a 76 y.o. female  was evaluated in triage.  Pt complains of nausea, generalized abdominal pain, and black stools x3 to 4 days.  Patient also complains of weakness and feeling short of breath.  Patient has a new oxygen requirement since arriving at the emergency department.  She is requiring 3 L of oxygen.  She was 88% on room air at best.  She denies chest pain at this time.  Review of Systems  Positive: Abdominal pain, black stools, nausea, shortness of breath, weakness Negative: Chest Pain  Physical Exam  BP (!) 110/52 (BP Location: Left Arm)   Pulse 87   Temp 99.2 F (37.3 C) (Oral)   Resp 18   SpO2 94%  Gen:   Awake, patient appears lethargic Resp:  Normal effort, new oxygen requirement MSK:   Moves extremities without difficulty  Other:    Medical Decision Making  Medically screening exam initiated at 8:18 PM.  Appropriate orders placed.  Mandy Kim was informed that the remainder of the evaluation will be completed by another provider, this initial triage assessment does not replace that evaluation, and the importance of remaining in the ED until their evaluation is complete.  Triage nurse planning to get patient 1 of next available rooms.  High level of concern for patient with new oxygen requirement   Mandy Kim 05/03/22 2023

## 2022-05-03 NOTE — Telephone Encounter (Signed)
Patient and husband wanted the losartan-potassium medication to be sent to [place]. Informed pt that the losartan and potassium were separate on the patient's med list but husband stated that on the med bottle it shows that it is one medication not two. Patient stated she needs it. Please advise.   [place], [address], [place] [address]  Phone:  [phone]  Fax:  [phone]  DEA #:  --

## 2022-05-03 NOTE — ED Triage Notes (Signed)
Pt is present today with abdominal pain and diarrhea. Pt states that her sx started x4 days ago

## 2022-05-04 ENCOUNTER — Encounter (HOSPITAL_COMMUNITY): Payer: Self-pay | Admitting: Internal Medicine

## 2022-05-04 ENCOUNTER — Emergency Department (HOSPITAL_COMMUNITY): Payer: Medicare HMO

## 2022-05-04 ENCOUNTER — Observation Stay (HOSPITAL_COMMUNITY): Payer: Medicare HMO

## 2022-05-04 ENCOUNTER — Other Ambulatory Visit: Payer: Self-pay

## 2022-05-04 DIAGNOSIS — K219 Gastro-esophageal reflux disease without esophagitis: Secondary | ICD-10-CM

## 2022-05-04 DIAGNOSIS — R195 Other fecal abnormalities: Secondary | ICD-10-CM | POA: Diagnosis present

## 2022-05-04 DIAGNOSIS — I10 Essential (primary) hypertension: Secondary | ICD-10-CM

## 2022-05-04 DIAGNOSIS — R911 Solitary pulmonary nodule: Secondary | ICD-10-CM | POA: Diagnosis not present

## 2022-05-04 DIAGNOSIS — R079 Chest pain, unspecified: Secondary | ICD-10-CM | POA: Diagnosis not present

## 2022-05-04 DIAGNOSIS — R197 Diarrhea, unspecified: Secondary | ICD-10-CM

## 2022-05-04 DIAGNOSIS — E1169 Type 2 diabetes mellitus with other specified complication: Secondary | ICD-10-CM

## 2022-05-04 DIAGNOSIS — J9601 Acute respiratory failure with hypoxia: Secondary | ICD-10-CM | POA: Diagnosis not present

## 2022-05-04 DIAGNOSIS — J439 Emphysema, unspecified: Secondary | ICD-10-CM | POA: Diagnosis not present

## 2022-05-04 DIAGNOSIS — N179 Acute kidney failure, unspecified: Secondary | ICD-10-CM

## 2022-05-04 DIAGNOSIS — R109 Unspecified abdominal pain: Secondary | ICD-10-CM | POA: Diagnosis not present

## 2022-05-04 DIAGNOSIS — E039 Hypothyroidism, unspecified: Secondary | ICD-10-CM | POA: Diagnosis present

## 2022-05-04 DIAGNOSIS — J9811 Atelectasis: Secondary | ICD-10-CM | POA: Diagnosis not present

## 2022-05-04 HISTORY — DX: Essential (primary) hypertension: I10

## 2022-05-04 HISTORY — DX: Acute kidney failure, unspecified: N17.9

## 2022-05-04 HISTORY — DX: Type 2 diabetes mellitus with other specified complication: E11.69

## 2022-05-04 HISTORY — DX: Diarrhea, unspecified: R19.7

## 2022-05-04 HISTORY — DX: Gastro-esophageal reflux disease without esophagitis: K21.9

## 2022-05-04 HISTORY — DX: Other fecal abnormalities: R19.5

## 2022-05-04 LAB — COMPREHENSIVE METABOLIC PANEL
ALT: 36 U/L (ref 0–44)
AST: 39 U/L (ref 15–41)
Albumin: 3.2 g/dL — ABNORMAL LOW (ref 3.5–5.0)
Alkaline Phosphatase: 34 U/L — ABNORMAL LOW (ref 38–126)
Anion gap: 12 (ref 5–15)
BUN: 39 mg/dL — ABNORMAL HIGH (ref 8–23)
CO2: 20 mmol/L — ABNORMAL LOW (ref 22–32)
Calcium: 8.5 mg/dL — ABNORMAL LOW (ref 8.9–10.3)
Chloride: 104 mmol/L (ref 98–111)
Creatinine, Ser: 2.51 mg/dL — ABNORMAL HIGH (ref 0.44–1.00)
GFR, Estimated: 19 mL/min — ABNORMAL LOW (ref >60)
Glucose, Bld: 127 mg/dL — ABNORMAL HIGH (ref 70–99)
Potassium: 3.5 mmol/L (ref 3.5–5.1)
Sodium: 136 mmol/L (ref 135–145)
Total Bilirubin: 0.6 mg/dL (ref 0.3–1.2)
Total Protein: 6.7 g/dL (ref 6.5–8.1)

## 2022-05-04 LAB — C DIFFICILE QUICK SCREEN W PCR REFLEX
C Diff antigen: POSITIVE — AB
C Diff toxin: NEGATIVE

## 2022-05-04 LAB — I-STAT ARTERIAL BLOOD GAS, ED
Acid-base deficit: 5 mmol/L — ABNORMAL HIGH (ref 0.0–2.0)
Bicarbonate: 20.6 mmol/L (ref 20.0–28.0)
Calcium, Ion: 1.22 mmol/L (ref 1.15–1.40)
HCT: 34 % — ABNORMAL LOW (ref 36.0–46.0)
Hemoglobin: 11.6 g/dL — ABNORMAL LOW (ref 12.0–15.0)
O2 Saturation: 88 %
Patient temperature: 98.6
Potassium: 3.6 mmol/L (ref 3.5–5.1)
Sodium: 136 mmol/L (ref 135–145)
TCO2: 22 mmol/L (ref 22–32)
pCO2 arterial: 40.4 mmHg (ref 32–48)
pH, Arterial: 7.315 — ABNORMAL LOW (ref 7.35–7.45)
pO2, Arterial: 59 mmHg — ABNORMAL LOW (ref 83–108)

## 2022-05-04 LAB — CBC WITH DIFFERENTIAL/PLATELET
Abs Immature Granulocytes: 0.08 10*3/uL — ABNORMAL HIGH (ref 0.00–0.07)
Basophils Absolute: 0.1 10*3/uL (ref 0.0–0.1)
Basophils Relative: 1 %
Eosinophils Absolute: 0.2 10*3/uL (ref 0.0–0.5)
Eosinophils Relative: 3 %
HCT: 31.1 % — ABNORMAL LOW (ref 36.0–46.0)
Hemoglobin: 10.2 g/dL — ABNORMAL LOW (ref 12.0–15.0)
Immature Granulocytes: 1 %
Lymphocytes Relative: 42 %
Lymphs Abs: 3.3 10*3/uL (ref 0.7–4.0)
MCH: 29.1 pg (ref 26.0–34.0)
MCHC: 32.8 g/dL (ref 30.0–36.0)
MCV: 88.6 fL (ref 80.0–100.0)
Monocytes Absolute: 1.2 10*3/uL — ABNORMAL HIGH (ref 0.1–1.0)
Monocytes Relative: 15 %
Neutro Abs: 2.9 10*3/uL (ref 1.7–7.7)
Neutrophils Relative %: 38 %
Platelets: 251 10*3/uL (ref 150–400)
RBC: 3.51 MIL/uL — ABNORMAL LOW (ref 3.87–5.11)
RDW: 16.4 % — ABNORMAL HIGH (ref 11.5–15.5)
WBC: 7.8 10*3/uL (ref 4.0–10.5)
nRBC: 0 % (ref 0.0–0.2)

## 2022-05-04 LAB — APTT: aPTT: 34 seconds (ref 24–36)

## 2022-05-04 LAB — TYPE AND SCREEN
ABO/RH(D): A POS
Antibody Screen: NEGATIVE

## 2022-05-04 LAB — D-DIMER, QUANTITATIVE: D-Dimer, Quant: 0.39 ug/mL-FEU (ref 0.00–0.50)

## 2022-05-04 LAB — LACTIC ACID, PLASMA: Lactic Acid, Venous: 0.9 mmol/L (ref 0.5–1.9)

## 2022-05-04 LAB — PROTIME-INR
INR: 1.2 (ref 0.8–1.2)
Prothrombin Time: 15.3 seconds — ABNORMAL HIGH (ref 11.4–15.2)

## 2022-05-04 LAB — CBG MONITORING, ED
Glucose-Capillary: 129 mg/dL — ABNORMAL HIGH (ref 70–99)
Glucose-Capillary: 139 mg/dL — ABNORMAL HIGH (ref 70–99)

## 2022-05-04 LAB — GLUCOSE, CAPILLARY
Glucose-Capillary: 105 mg/dL — ABNORMAL HIGH (ref 70–99)
Glucose-Capillary: 143 mg/dL — ABNORMAL HIGH (ref 70–99)

## 2022-05-04 LAB — MAGNESIUM: Magnesium: 2 mg/dL (ref 1.7–2.4)

## 2022-05-04 LAB — SARS CORONAVIRUS 2 BY RT PCR: SARS Coronavirus 2 by RT PCR: NEGATIVE

## 2022-05-04 LAB — CLOSTRIDIUM DIFFICILE BY PCR, REFLEXED: Toxigenic C. Difficile by PCR: POSITIVE — AB

## 2022-05-04 LAB — PROCALCITONIN: Procalcitonin: 0.35 ng/mL

## 2022-05-04 IMAGING — DX DG CHEST 1V PORT
1 series · 1 of 1 positions shown · non-contrast
Comparison: [DATE]

CLINICAL DATA: chest pain

EXAM:
PORTABLE CHEST 1 VIEW

[chest ap]
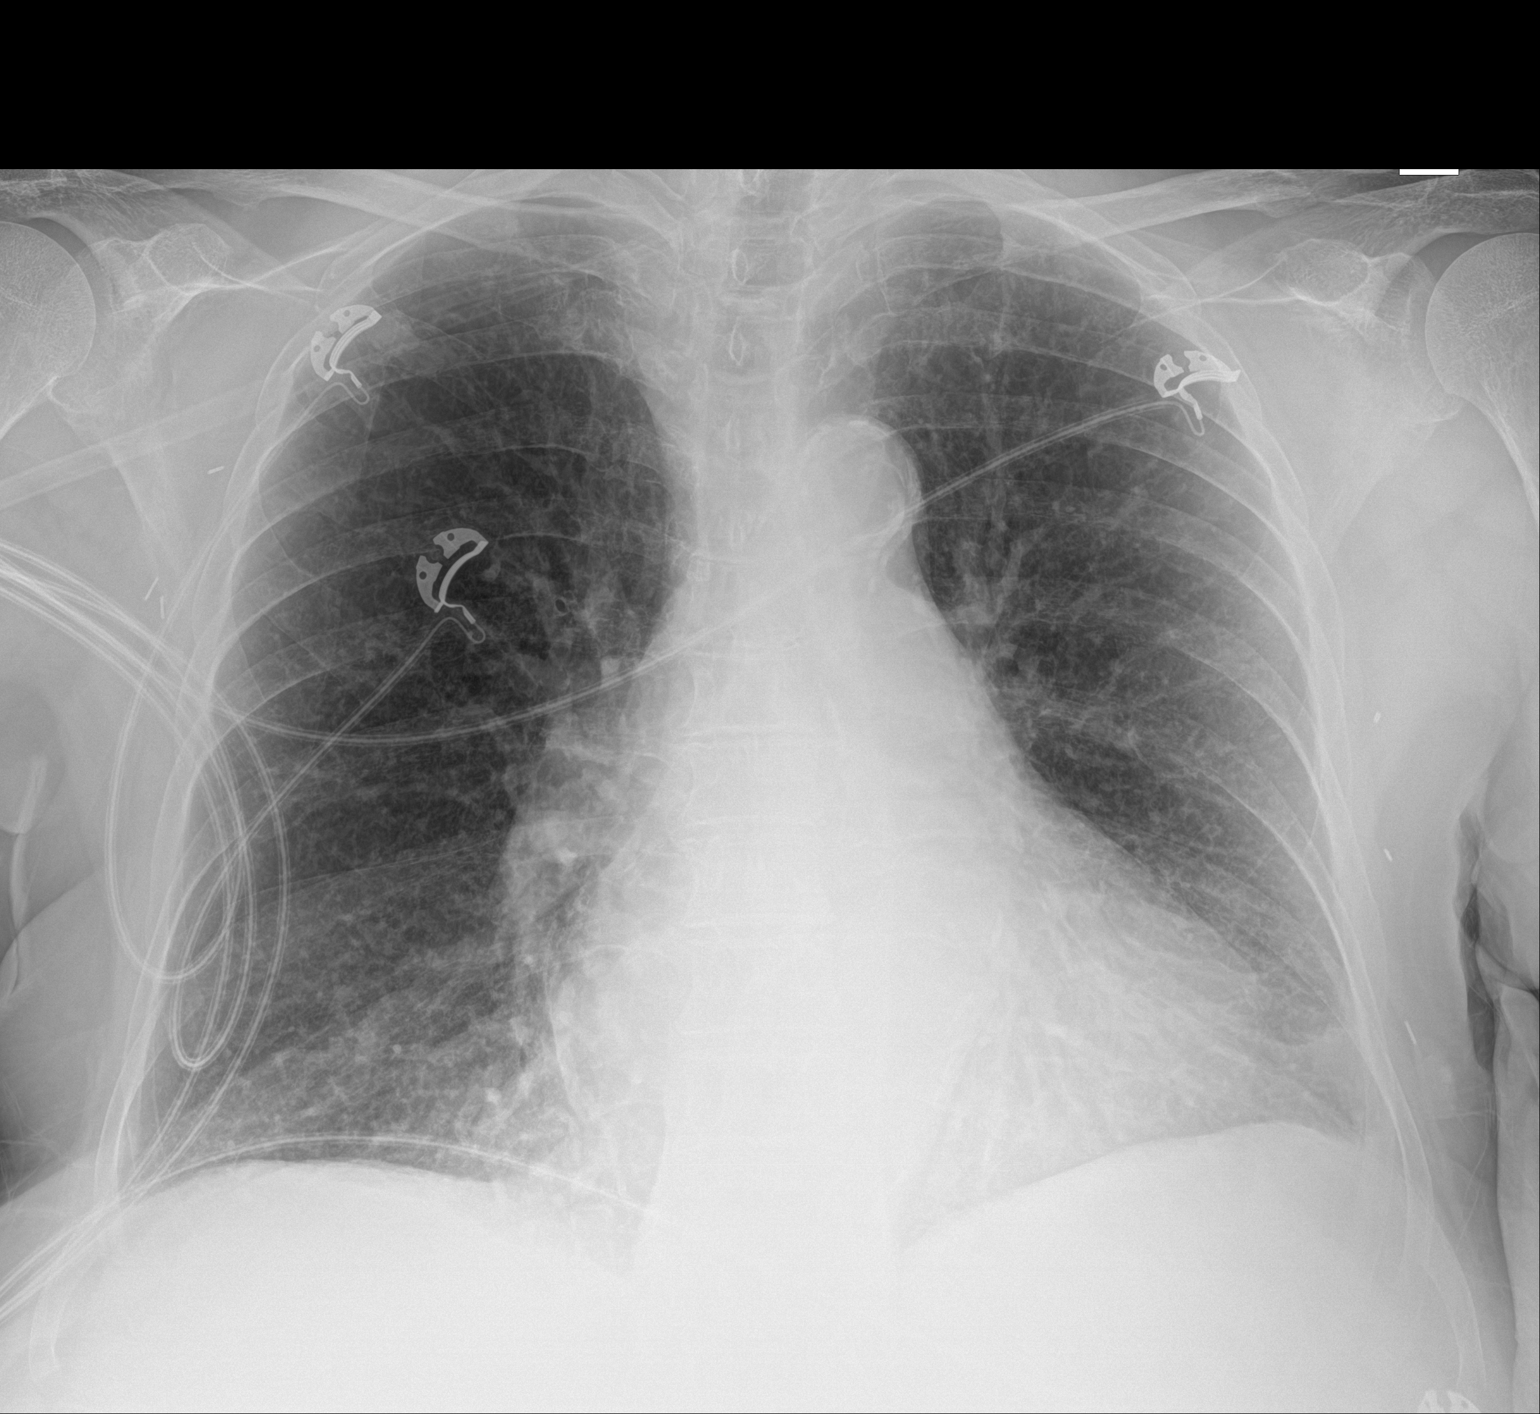

[1 of 1 positions shown; findings below may reference images not displayed]

FINDINGS: Heart is borderline in size. No confluent airspace opacities,
effusions or edema. No acute bony abnormality.
IMPRESSION: No active disease.

## 2022-05-04 IMAGING — CT CT ABD-PELV W/O CM
2 of 4 series · 17 of 46 positions shown, 19 images · non-contrast
Comparison: [DATE]

CLINICAL DATA: Left lower quadrant pain



[Series 3: a/p w/o 5mm · axial · non-contrast · 0.81mm/px · z∈[+685,+1085]mm · 14 of 88 slices shown, 16 images]
[im 4/88  soft-tissue]
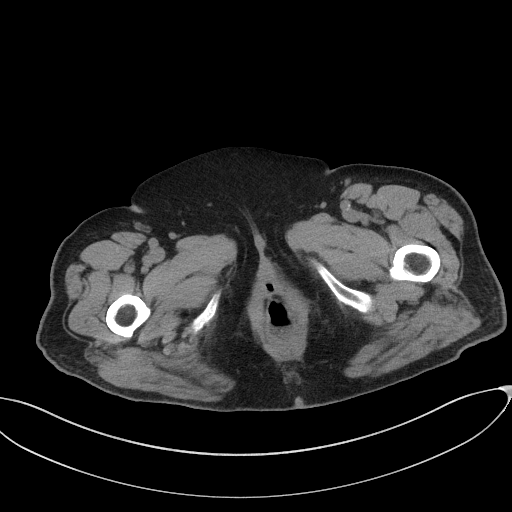
[im 4/88  bone]
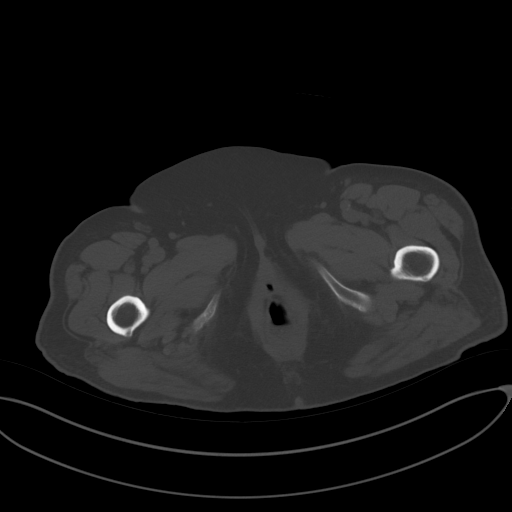
[im 11/88  soft-tissue]
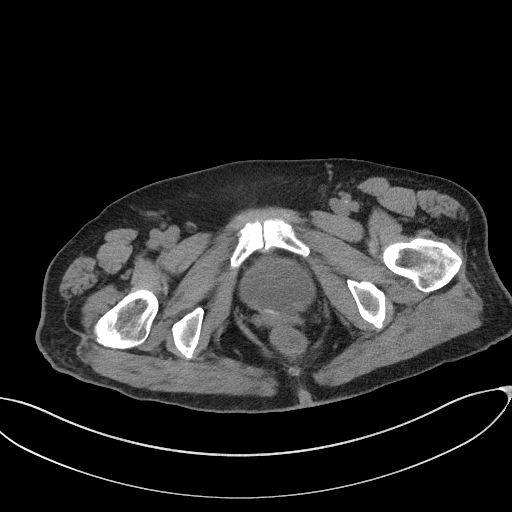
[im 19/88  soft-tissue]
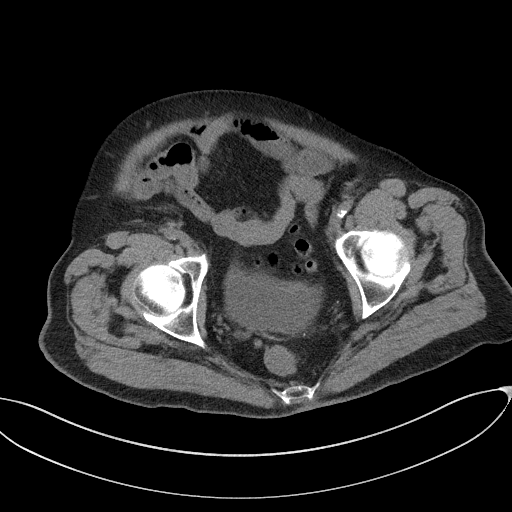
[im 22/88  soft-tissue]
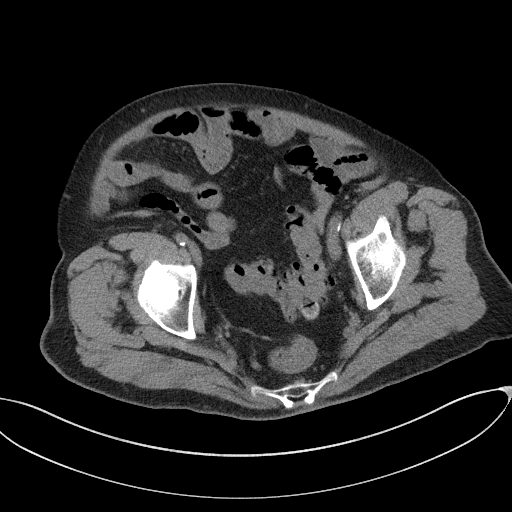
[im 30/88  soft-tissue]
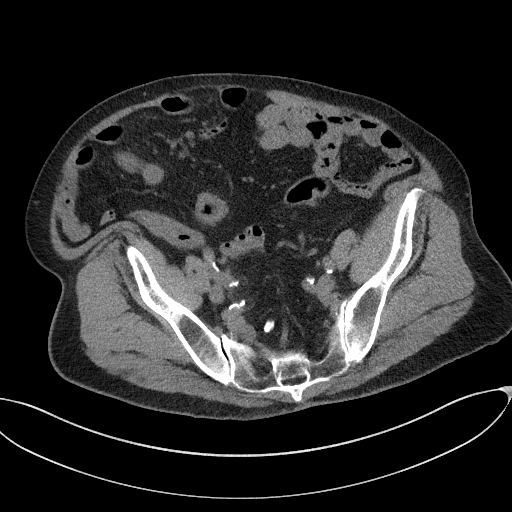
[im 37/88  soft-tissue]
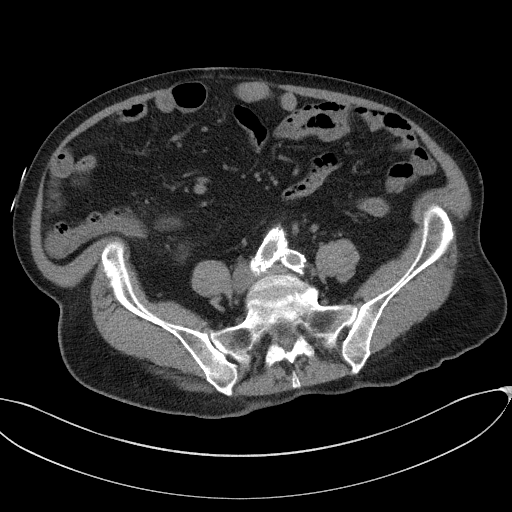
[im 40/88  soft-tissue]
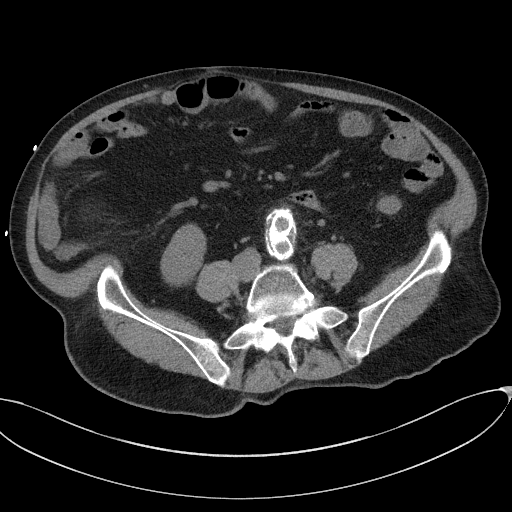
[im 48/88  soft-tissue]
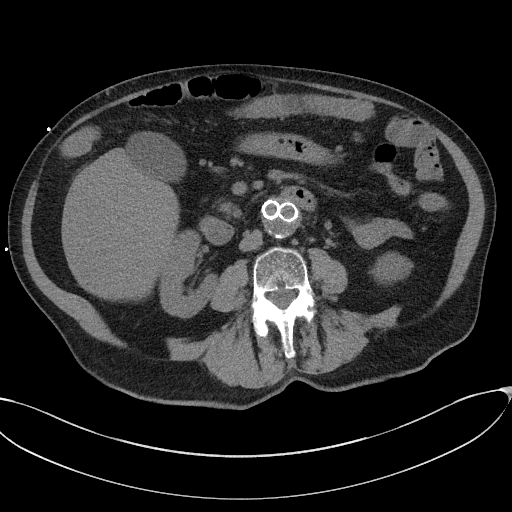
[im 51/88  soft-tissue]
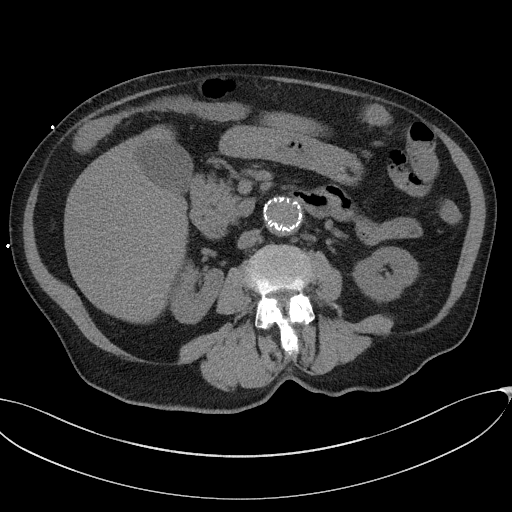
[im 51/88  bone]
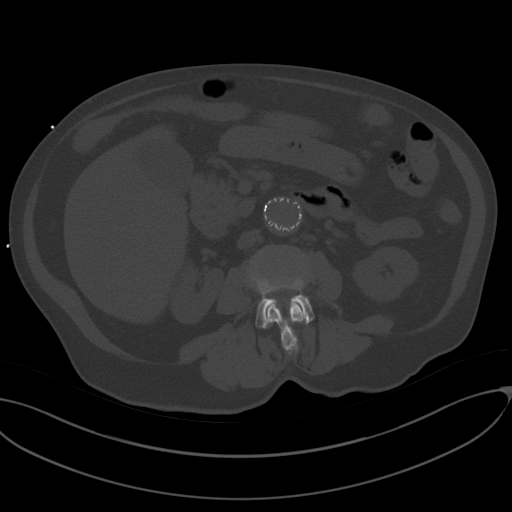
[im 59/88  soft-tissue]
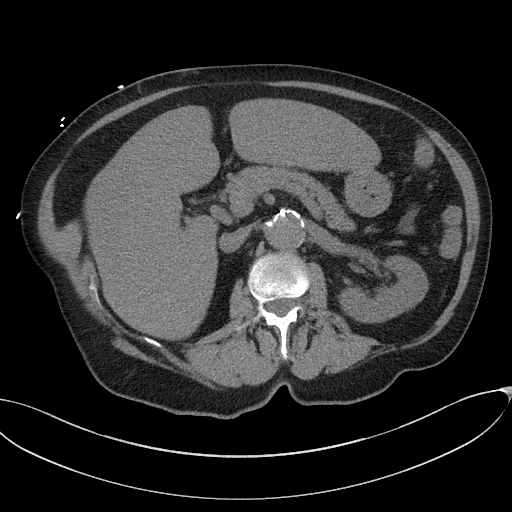
[im 66/88  soft-tissue]
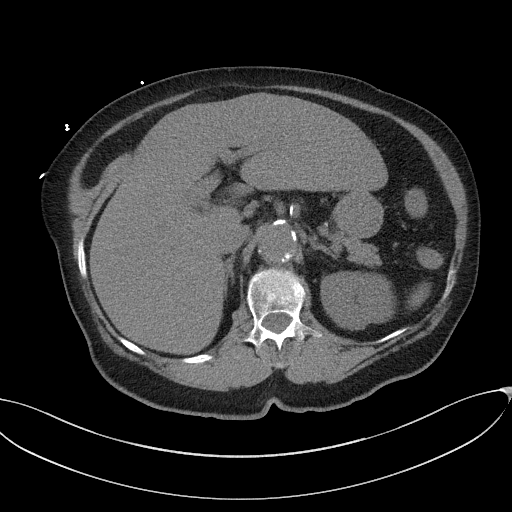
[im 69/88  soft-tissue]
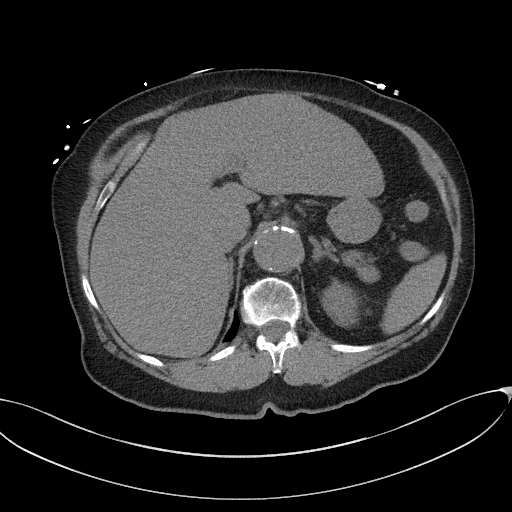
[im 77/88  soft-tissue]
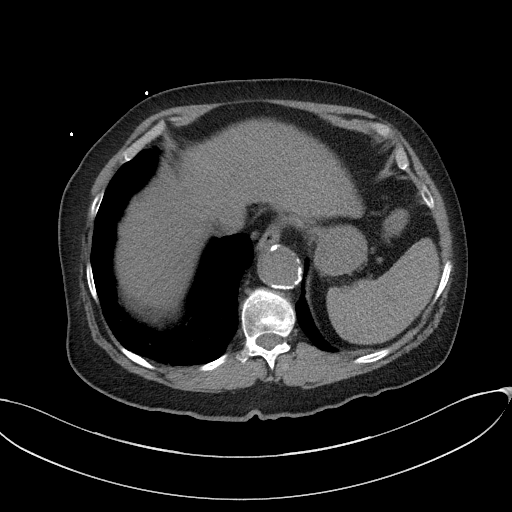
[im 84/88  soft-tissue]
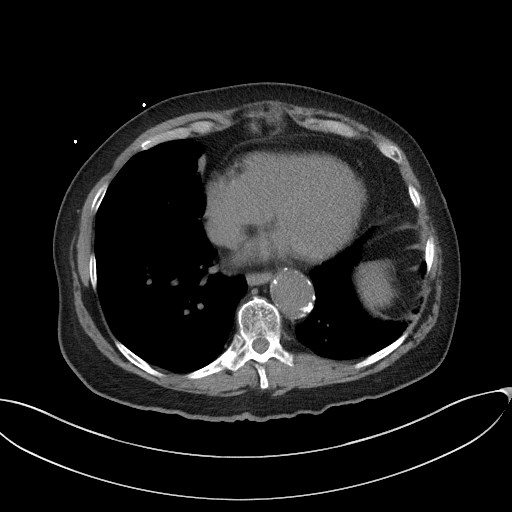

[Series 6: a/p w/o cor · coronal · non-contrast · 0.85mm/px · 3 of 161 slices shown]
[im 54/161  soft-tissue]
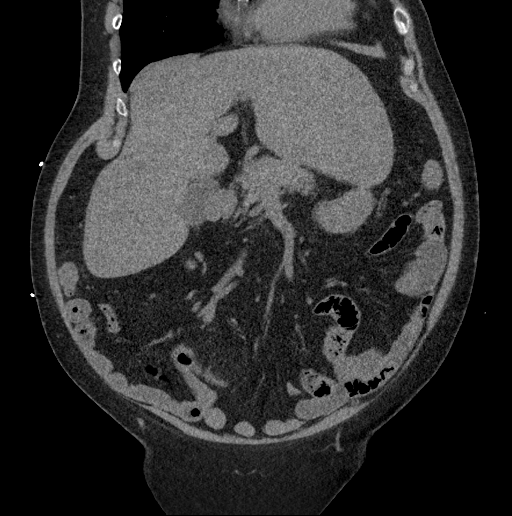
[im 72/161  soft-tissue]
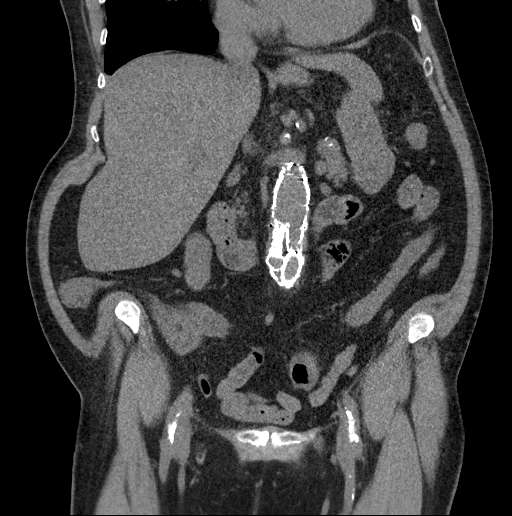
[im 89/161  soft-tissue]
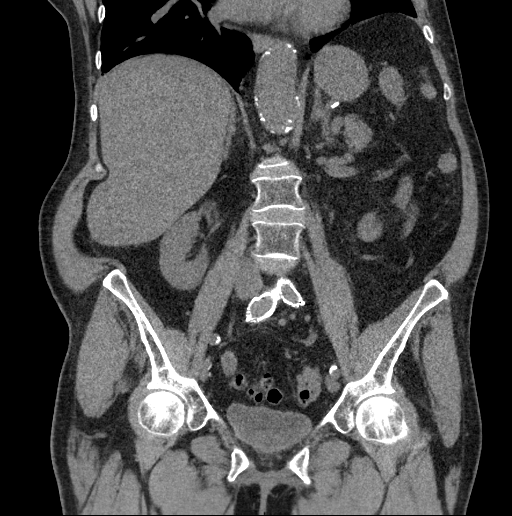

[17 of 46 positions shown; findings below may reference images not displayed]

FINDINGS: Lower chest: No acute abnormality

Hepatobiliary: No focal hepatic abnormality. Gallbladder
unremarkable.

Pancreas: No focal abnormality or ductal dilatation.

Spleen: No focal abnormality.  Normal size.

Adrenals/Urinary Tract: Punctate nonobstructing stone in the lower
pole of the right kidney. No ureteral stones or hydronephrosis
bilaterally. No renal or adrenal mass. Urinary bladder unremarkable.

Stomach/Bowel: Diffuse colonic diverticulosis. No active
diverticulitis. Stomach and small bowel decompressed, unremarkable.
Appendix is normal.

Vascular/Lymphatic: Prior stent graft repair of abdominal aortic
aneurysm. Aneurysm sac is stable. No adenopathy.

Reproductive: Prior hysterectomy.  No adnexal masses.

Other: No free fluid or free air.

Musculoskeletal: No acute bony abnormality
IMPRESSION: Colonic diverticulosis. No active diverticulitis.

Prior stent graft repair of AAA. Stable aneurysm sac size.

No acute findings.

## 2022-05-04 MED ORDER — SODIUM CHLORIDE 0.9 % IV SOLN
1000.0000 mL | INTRAVENOUS | Status: DC
  Administered 2022-05-04: 1000 mL via INTRAVENOUS

## 2022-05-04 MED ORDER — INSULIN DEGLUDEC 100 UNIT/ML ~~LOC~~ SOPN: 30.0000 [IU] | PEN_INJECTOR | Freq: Every day | SUBCUTANEOUS | Status: DC

## 2022-05-04 MED ORDER — INSULIN GLARGINE-YFGN 100 UNIT/ML ~~LOC~~ SOLN
30.0000 [IU] | Freq: Every day | SUBCUTANEOUS | Status: DC
Start: 2022-05-04 — End: 2022-05-07
  Administered 2022-05-04 – 2022-05-06 (×3): 30 [IU] via SUBCUTANEOUS
  Filled 2022-05-04 (×4): qty 0.3

## 2022-05-04 MED ORDER — SODIUM CHLORIDE 0.9% FLUSH
3.0000 mL | Freq: Two times a day (BID) | INTRAVENOUS | Status: DC
  Administered 2022-05-04: 3 mL via INTRAVENOUS

## 2022-05-04 MED ORDER — CEPHALEXIN 250 MG PO CAPS
250.0000 mg | ORAL_CAPSULE | Freq: Every day | ORAL | Status: DC
  Administered 2022-05-04: 250 mg via ORAL
  Filled 2022-05-04: qty 1

## 2022-05-04 MED ORDER — GEMFIBROZIL 600 MG PO TABS
600.0000 mg | ORAL_TABLET | Freq: Two times a day (BID) | ORAL | Status: DC
  Administered 2022-05-05 – 2022-05-07 (×5): 600 mg via ORAL
  Filled 2022-05-04 (×7): qty 1

## 2022-05-04 MED ORDER — GABAPENTIN 100 MG PO CAPS
100.0000 mg | ORAL_CAPSULE | Freq: Two times a day (BID) | ORAL | Status: DC
  Administered 2022-05-04 – 2022-05-07 (×7): 100 mg via ORAL
  Filled 2022-05-04 (×7): qty 1

## 2022-05-04 MED ORDER — CLOPIDOGREL BISULFATE 75 MG PO TABS: 75.0000 mg | ORAL_TABLET | Freq: Every day | ORAL | Status: DC

## 2022-05-04 MED ORDER — HYDRALAZINE HCL 20 MG/ML IJ SOLN: 10.0000 mg | Freq: Four times a day (QID) | INTRAMUSCULAR | Status: DC | PRN

## 2022-05-04 MED ORDER — PANTOPRAZOLE SODIUM 40 MG PO TBEC: 40.0000 mg | DELAYED_RELEASE_TABLET | Freq: Every day | ORAL | Status: DC

## 2022-05-04 MED ORDER — SODIUM CHLORIDE 0.9 % IV BOLUS (SEPSIS)
1000.0000 mL | Freq: Once | INTRAVENOUS | Status: AC
  Administered 2022-05-04: 1000 mL via INTRAVENOUS

## 2022-05-04 MED ORDER — ENOXAPARIN SODIUM 40 MG/0.4ML IJ SOSY: 40.0000 mg | PREFILLED_SYRINGE | INTRAMUSCULAR | Status: DC

## 2022-05-04 MED ORDER — LEVOTHYROXINE SODIUM 50 MCG PO TABS
50.0000 ug | ORAL_TABLET | Freq: Every day | ORAL | Status: DC
  Administered 2022-05-04 – 2022-05-07 (×4): 50 ug via ORAL
  Filled 2022-05-04 (×2): qty 1
  Filled 2022-05-04: qty 2
  Filled 2022-05-04: qty 1

## 2022-05-04 MED ORDER — SODIUM CHLORIDE 0.9 % IV SOLN
1000.0000 mL | INTRAVENOUS | Status: DC
  Administered 2022-05-04 – 2022-05-05 (×3): 1000 mL via INTRAVENOUS

## 2022-05-04 MED ORDER — OMEGA-3-ACID ETHYL ESTERS 1 G PO CAPS
1.0000 g | ORAL_CAPSULE | Freq: Two times a day (BID) | ORAL | Status: DC
  Administered 2022-05-04 – 2022-05-07 (×7): 1 g via ORAL
  Filled 2022-05-04 (×8): qty 1

## 2022-05-04 MED ORDER — ALBUTEROL SULFATE (2.5 MG/3ML) 0.083% IN NEBU: 2.5000 mg | INHALATION_SOLUTION | RESPIRATORY_TRACT | Status: DC | PRN

## 2022-05-04 MED ORDER — ONDANSETRON HCL 4 MG/2ML IJ SOLN: 4.0000 mg | Freq: Four times a day (QID) | INTRAMUSCULAR | Status: DC | PRN

## 2022-05-04 MED ORDER — NIACIN ER (ANTIHYPERLIPIDEMIC) 500 MG PO TBCR
500.0000 mg | EXTENDED_RELEASE_TABLET | Freq: Every day | ORAL | Status: DC
  Administered 2022-05-04 – 2022-05-06 (×3): 500 mg via ORAL
  Filled 2022-05-04 (×4): qty 1

## 2022-05-04 MED ORDER — LOSARTAN POTASSIUM 50 MG PO TABS: 50.0000 mg | ORAL_TABLET | Freq: Every day | ORAL | 0 refills | Status: DC

## 2022-05-04 MED ORDER — ONDANSETRON HCL 4 MG PO TABS: 4.0000 mg | ORAL_TABLET | Freq: Four times a day (QID) | ORAL | Status: DC | PRN

## 2022-05-04 MED ORDER — PANTOPRAZOLE SODIUM 40 MG IV SOLR
40.0000 mg | Freq: Two times a day (BID) | INTRAVENOUS | Status: DC
  Administered 2022-05-04 – 2022-05-05 (×3): 40 mg via INTRAVENOUS
  Filled 2022-05-04 (×3): qty 10

## 2022-05-04 MED ORDER — PROBIOTIC DAILY PO CAPS: ORAL_CAPSULE | Freq: Every day | ORAL | Status: DC

## 2022-05-04 MED ORDER — LEVOTHYROXINE SODIUM 25 MCG PO TABS: 50.0000 ug | ORAL_TABLET | Freq: Every day | ORAL | Status: DC

## 2022-05-04 MED ORDER — VANCOMYCIN HCL 125 MG PO CAPS
125.0000 mg | ORAL_CAPSULE | Freq: Four times a day (QID) | ORAL | Status: DC
  Administered 2022-05-04 – 2022-05-07 (×11): 125 mg via ORAL
  Filled 2022-05-04 (×11): qty 1

## 2022-05-04 MED ORDER — INSULIN ASPART 100 UNIT/ML IJ SOLN
0.0000 [IU] | Freq: Three times a day (TID) | INTRAMUSCULAR | Status: DC
  Administered 2022-05-04 (×3): 2 [IU] via SUBCUTANEOUS
  Administered 2022-05-05: 5 [IU] via SUBCUTANEOUS
  Administered 2022-05-05: 3 [IU] via SUBCUTANEOUS
  Administered 2022-05-06 (×2): 2 [IU] via SUBCUTANEOUS

## 2022-05-04 NOTE — Assessment & Plan Note (Signed)
.   Holding home regimen of Cozaar due to acute kidney injury . PRN intravenous antihypertensives for excessively elevated blood pressure

## 2022-05-04 NOTE — Assessment & Plan Note (Addendum)
.   Patient been placed on Accu-Cheks before every meal and nightly with sliding scale insulin . Holding home regimen of oral hypoglycemics . Placing patient on reduced regimen of basal insulin therapy considering tenuous oral intake . Hemoglobin A1C 7.9% 02/2022 . Advance to diabetic diet once able

## 2022-05-04 NOTE — Assessment & Plan Note (Addendum)
   Patient reports intermittent dark stools over the last several days  No gross bleeding on exam  Hemoglobin is only slightly decreased compared to prior values however this may be obscured due to hemoconcentration  We will switch patient to intravenous PPI for now as we evaluate this  Holding home regimen of Plavix for now  Obtaining serial hemoglobin and hematocrit  Obtaining stool Hemoccult  SCDs for DVT prophylaxis  If hemoglobin continues to downtrend will consider GI consultation

## 2022-05-04 NOTE — Progress Notes (Signed)
PROGRESS NOTE   Mandy Kim  XQJ:[id number] DOB: 03-Jul-1946 DOA: 05/03/2022 PCP: Darreld Mclean, MD  Brief Narrative:  49 white female CKD 3 AA, DM TY 2 with underlying diabetic neuropathy, recurrent urinary tract infections Underlying history of breast cancer in 2008 with no recurrence status postmastectomy + tamoxifen, cervical cance TAH/BSO r, basal cell of the right nose OSA Prior AAA repair Transaminitis/chronic liver disease?  Cause Cystocele status post repair Dr. Nicolette Bang 2020--is on chronic suppressive Keflex per Dr. Alyson Ingles  Presented to urgent care-->ED 05/03/2021 several days ABD pain diarrhea?  Melena secondary to her blood pressure 106 and 44 Tmax 99 5 Placed on O2 and sats in the 80s on pretty good  Patient tells me that ever since her bladder surgery several years ago under Dr. Louis Meckel of urology-she has had intermittent "curd-like stools" with intermittent diarrhea The difference this time around is that the patient is having continuous diarrhea with fevers She describes intermittent black stools and then green stools which are watery and she is consistently taking her suppressive Keflex She tells me that she is able to eat some had some ginger ale at home went to see the nurse practitioner who advised her to use fluids with electrolytes and bland watermelon    Found to have BUNs/creatinine elevation 0.7-->1.9 Also new oxygen requirement (ABG PaO2 59) C. difficile pending-giving IVF   O/e BP (!) 117/53   Pulse 66   Temp 98.9 F (37.2 C)   Resp 20   SpO2 100%  Pleasant coherent white female no distress no icterus no pallor neck soft supple Mallampati 2 S1-S2 no murmur Slight tenderness to the right lower quadrant no rebound Cannot appreciate HSM Neurologically intact no focal deficit moving 4 limbs equally I deferred a full neurological exam  Hospital-Problem based course  diarrhea?  C. difficile?  Viral-unlikely to be viral but will get  GI pathogen panel, follow C. difficile panel given that she is on suppressive antibiotics-she does not have overt immunocompromise but has had 3 episodes of varying types of cancer which raises the risk (all of these were treated) I do not think that she will need to continue suppressive therapy but I will CC Dr. Louis Meckel on this note and let him make that call  For the patient's hypoxic respiratory failure-CT chest essentially rules out pneumonia she does have severe coronary calcifications which will need to be followed up with dedicated study as per cardiology She has emphysema without a setting or history of normal COPD which may need to be characterized  Overall she is stable at this time-I will graduate her to regular diet to see if she can tolerate the same, we will keep her on fluids 125 cc/H, we will try to wean her oxygen when she reaches the floor and ambulate her, repeat Chem-12 CBC in about 1 day and especially follow-up pathogen panel  We will BladderScan her as her creatinine creatinine has risen and she may need a Foley catheter placed-she has a history of complex anatomy secondary to cystocele repair and if her creatinine does not improve she will need a renal ultrasound, urinary electrolytes in the morning and discussion with Dr. Louis Meckel who I have already [facility] on this note  Dm-hold metformin 1000 twice daily, hold ARB at this time given slight rise in creatinine and AKI on admission holding glipizide 5 as this is cleared by the kidney reorder gabapentin 100 twice daily [name] will be down titrated and patient will be placed  on sliding scale  Plavix has been held given?  GI bleed and patient will be getting serial H&H-please call GI if continues to have bleeding or drop in hemoglobin Rest as per Dr. Tinnie Gens    DVT prophylaxis: SCD Code Status: DNR confirmed at bedside Family Communication: Discussed with husband at the bedside Disposition:  Status is: Observation The patient  will require care spanning > 2 midnights and should be moved to inpatient because: Needs to be worked up and characterized

## 2022-05-04 NOTE — Assessment & Plan Note (Signed)
.   Continuing home regimen of lipid lowering therapy.  

## 2022-05-04 NOTE — Progress Notes (Signed)
Noticed APs notes that patient's code status is DNR, doctor confirmed it with family but current code status is full code. Verified and confirmed with the patient and family that she wants Rapid response/Code Blue  to be called, CPR, ALC medication to be administered, and BiPAP for oxygenation. However, no mechanical ventilation or intubation and no defibrillation or cardioversion. Night time on-call MD Opyd made aware. Will relay it to Dr. Verlon Au in the morning for clarification.

## 2022-05-04 NOTE — Telephone Encounter (Signed)
Rx was sent  

## 2022-05-04 NOTE — ED Notes (Signed)
Pt c/o abd pain for several days family at  the bedsde

## 2022-05-04 NOTE — Assessment & Plan Note (Signed)
Continuing home regimen of daily PPI therapy.  

## 2022-05-04 NOTE — Assessment & Plan Note (Signed)
.   Resume home regimen of Synthroid 

## 2022-05-04 NOTE — H&P (Signed)
History and Physical    Patient: Mandy Kim MRN: [id number] DOA: 05/03/2022  Date of Service: the patient was seen and examined on 05/04/2022  Patient coming from: Home  Chief Complaint:  Chief Complaint  Patient presents with   Diarrhea   GI Bleeding    HPI:   76 year old female with past medical history of insulin-dependent diabetes mellitus type 2, hyperlipidemia, hypertension, gastroesophageal reflux disease, remote history of bilateral breast cancer who presents to [facility] emergency department with diarrhea and abdominal discomfort.  Patient explains that approximately 6 days ago she began to experience abdominal discomfort.  This abdominal discomfort has been sharp to crampy in quality, moderate in intensity and generalized.  Abdominal discomfort has been associated with bouts of watery diarrhea occurring several times daily.  Patient denies any associated nausea or vomiting.  Patient denies any consumption of undercooked foods, recent travel or sick contacts with similar symptoms.  Of note, patient is on chronic suppressive antibiotic therapy with Keflex per her urologist  Patient symptoms continue to persist over the next several days.  Patient also reports that over the span of time her stools were transiently dark to black in color.  These seem to have resolved after several days and her watery stools are now brown in color once again.  Patient eventually presented to [facility] emergency department for evaluation due to persisting symptoms.  Upon evaluation in the emergency department on arrival patient was found to be hypoxic with oxygen saturations in the 80s.  Patient had to be placed on 3 L of oxygen via nasal cannula.  Oxygen requirement was confirmed with obtaining a room air ABG revealing a PaO2 of 59.  Patient was also found to be developing acute kidney injury with creatinine currently 1.89, up from 0.782 months ago.  Patient was initiated on  intravenous fluids with sodium chloride and the hospitalist group was then called to assess the patient for admission to the hospital.  Review of Systems: Review of Systems  Gastrointestinal:  Positive for abdominal pain and diarrhea.    Past Medical History:  Diagnosis Date   Anticoagulant long-term use    plavix   Cancer (HCC)    CHF (congestive heart failure) ([place]) 2000   Chronic cystitis    Chronic kidney disease    Diverticulitis    GERD (gastroesophageal reflux disease)    Hiatal hernia    History of basal cell carcinoma excision    face/ nose right side   History of bilateral breast cancer followed by oncologist until 2008,  per pt no recurrence   right 2002  and left 2003 s/p  mastectomy with completed chemo and tamexifen therapy    History of cervical cancer 2000   s/p  TAH w/ BSO   History of diverticulitis of colon    Hyperlipidemia    OSA on CPAP    PAD (peripheral artery disease) (HCC)    Peripheral neuropathy    PONV (postoperative nausea and vomiting)    S/P AAA (abdominal aortic aneurysm) repair 09/15/2011   Stress incontinence due to pelvic organ prolapse    Type 2 diabetes mellitus treated with insulin ([place])    followed by pcp   Wears glasses     Past Surgical History:  Procedure Laterality Date   ABDOMINAL HYSTERECTOMY  2000   w/  BSO,  and Bladder tacking abdominally   CARPAL TUNNEL RELEASE Left 1999   CATARACT EXTRACTION W/ INTRAOCULAR LENS  IMPLANT, BILATERAL  2015  ENDOVASCULAR REPAIR/STENT GRAFT  09-15-2011    _0    INCONTINENCE SURGERY  2007  approx.   IR ANGIO INTRA EXTRACRAN SEL INTERNAL CAROTID UNI L MOD SED  04/11/2022   IR ANGIO VERTEBRAL SEL VERTEBRAL UNI R MOD SED  04/11/2022   IR US GUIDE VASC ACCESS RIGHT  04/11/2022   MASTECTOMY Bilateral right 2002;  left 2003   lymph node dissection done only on right side   ROBOTIC ASSISTED LAPAROSCOPIC SACROCOLPOPEXY N/A 05/23/2019   Procedure: XI ROBOTIC ASSISTED LAPAROSCOPIC SACROCOLPOPEXY;   Surgeon: Ardis Hughs, MD;  Location: [facility] ORS;  Service: Urology;  Laterality: N/A;   TONSILLECTOMY  child   TYMPANOPLASTY Right 1970s   "had my ear drum replacement with a plastic one"   WRIST GANGLION EXCISION Left 1998    Social History:  reports that she quit smoking about 22 years ago. Her smoking use included cigarettes. She has never used smokeless tobacco. She reports that she does not drink alcohol and does not use drugs.  Allergies  Allergen Reactions   Aspirin     Per pt any pain medications - causes fluid build up    Bee Venom Swelling   Ibuprofen Swelling   Ozempic (0.25 Or 0.5 Mg-Dose) [Semaglutide(0.25 Or 0.59m-Dos)] Diarrhea and Nausea And Vomiting    Flatulence   Poison Oak Extract Itching and Swelling   Statins     "skin feels like bugs under the skin"   Trulicity [Dulaglutide] Nausea And Vomiting and Other (See Comments)    Flatulance, "deathly sick"   Tylenol [Acetaminophen] Swelling   Victoza [Liraglutide] Other (See Comments)    Stomach upset issues     Family History  Problem Relation Age of Onset   Cancer Mother    Pneumonia Mother    Thyroid disease Mother    Thyroid disease Sister    Obesity Daughter    Hypertension Daughter    Thyroid disease Son    Pneumonia Maternal Grandmother    Heart attack Maternal Grandfather    Diabetes Neg Hx     Prior to Admission medications   Medication Sig Start Date End Date Taking? Authorizing Provider  Accu-Chek Softclix Lancets lancets TEST BLOOD SUGAR THREE TIMES DAILY 07/20/21   Copland, JGay Filler MD  Alcohol Swabs (DROPSAFE ALCOHOL PREP) 70 % PADS USE TO TEST BLOOD SUGAR 3 TIMES DAILY. 07/20/21   Copland, JGay Filler MD  Alcohol Swabs PADS Use to test blood sugar 3 times daily. Dx: E11.9 01/14/19   Copland, JGay Filler MD  b complex vitamins tablet Take 0.5 tablets by mouth 2 (two) times daily.     [provider]  Biotin 5 MG TABS Take 5,000 mcg by mouth at bedtime.     [provider]   Blood Glucose Calibration (GLUCOSE CONTROL) SOLN Use to test blood sugar 3 times daily. Dx: E11.9 12/20/15   Copland, JGay Filler MD  Blood Glucose Monitoring Suppl (ACCU-CHEK AVIVA PLUS) w/Device KIT USE AS DIRECTED  TO TEST BLOOD GLUCOSE BID E11.9 05/11/21   Copland, JGay Filler MD  cephALEXin (KEFLEX) 250 MG capsule Take 1 capsule (250 mg total) by mouth daily. Daily prophylaxis per urology 02/28/21   Copland, JGay Filler MD  clopidogrel (PLAVIX) 75 MG tablet TAKE 1 TABLET EVERY DAY 02/10/22   Copland, JGay Filler MD  Cranberry 500 MG TABS Take 500 mg by mouth daily.    [provider]  gabapentin (NEURONTIN) 100 MG capsule TAKE 1 CAPSULE TWICE DAILY 05/03/21   Copland,  Gay Filler, MD  gemfibrozil (LOPID) 600 MG tablet TAKE 1 TABLET TWICE DAILY BEFORE MEALS 01/27/22   Copland, Gay Filler, MD  glipiZIDE (GLUCOTROL) 5 MG tablet TAKE 1 TABLET TWICE DAILY BEFORE A MEAL 11/14/21   Copland, Gay Filler, MD  glucose blood (ACCU-CHEK AVIVA PLUS) test strip TEST BLOOD SUGAR THREE TIMES DAILY 05/25/21   Copland, Gay Filler, MD  Insulin Pen Needle 32G X 4 MM MISC Used to inject insulin 1x daily. 10/18/17   Renato Shin, MD  Insulin Syringe-Needle U-100 (INSULIN SYRINGE 1CC/30GX5/16") 30G X 5/16" 1 ML MISC Use to inject insulin 1 time per day. 05/22/17   Renato Shin, MD  levothyroxine (SYNTHROID) 50 MCG tablet Take 1 tablet (50 mcg total) by mouth daily before breakfast. 12/26/21   Copland, Gay Filler, MD  losartan (COZAAR) 50 MG tablet Take 1 tablet (50 mg total) by mouth daily. 04/10/22   Copland, Gay Filler, MD  magnesium oxide (MAG-OX) 400 MG tablet Take 200 mg by mouth daily.    [provider]  Menthol, Topical Analgesic, (BIOFREEZE ROLL-ON EX) Apply 1 application topically as needed (joint pain).    [provider]  metFORMIN (GLUCOPHAGE) 1000 MG tablet TAKE 1 TABLET TWICE DAILY WITH MEALS 03/13/22   Copland, Gay Filler, MD  Multiple Vitamin (MULTIVITAMIN) tablet Take 0.5 tablets by mouth 2  (two) times daily.     [provider]  niacin (NIASPAN) 500 MG CR tablet Take 1 tablet (500 mg total) by mouth at bedtime. 08/25/15   Copland, Gay Filler, MD  Omega-3 Fatty Acids (FISH OIL) 1000 MG CAPS Take 1,000 mg by mouth 2 (two) times a day.     [provider]  pantoprazole (PROTONIX) 40 MG tablet Take 1 tablet (40 mg total) by mouth daily. 01/02/22   Copland, Gay Filler, MD  potassium chloride SA (KLOR-CON M) 20 MEQ tablet TAKE 1 TABLET EVERY DAY 02/07/22   Copland, Gay Filler, MD  Probiotic Product (PROBIOTIC DAILY PO) Take 1 capsule by mouth daily.    [provider]  TRESIBA FLEXTOUCH 100 UNIT/ML FlexTouch Pen INJECT  60 UNITS EVERY DAY AT BEDTIME 11/14/21   Copland, Gay Filler, MD  VITAMIN D PO Take 1 capsule by mouth daily.     [provider]  zinc gluconate 50 MG tablet Take 50 mg by mouth at bedtime.    [provider]    Physical Exam:  Vitals:   05/04/22 0345 05/04/22 0445 05/04/22 0530 05/04/22 0615  BP:  (!) 111/48 (!) 110/47 (!) 117/53  Pulse: 78 69 63 66  Resp: _0 Temp:      TempSrc:      SpO2: 95% 90% 96% 95%    Constitutional: Awake alert and oriented x3, no associated distress.   Skin: no rashes, no lesions, poor skin turgor noted. Eyes: Pupils are equally reactive to light.  No evidence of scleral icterus or conjunctival pallor.  ENMT: dry mucous membranes noted.  Posterior pharynx clear of any exudate or lesions.   Neck: normal, supple, no masses, no thyromegaly.  No evidence of jugular venous distension.   Respiratory: Mild bibasilar rales.   no wheezing, Normal respiratory effort. No accessory muscle use.  Cardiovascular: Regular rate and rhythm, no murmurs / rubs / gallops. No extremity edema. 2+ pedal pulses. No carotid bruits.  Chest:   Nontender without crepitus or deformity.   Back:   Nontender without crepitus or deformity. Abdomen:  Mild generalized tenderness.  Hyperactive  bowel sounds.   No evidence  of intra-abdominal masses.  Positive bowel sounds noted in all quadrants.   Musculoskeletal: No joint deformity upper and lower extremities. Good ROM, no contractures. Normal muscle tone.  Neurologic: CN 2-12 grossly intact. Sensation intact.  Patient moving all 4 extremities spontaneously.  Patient is following all commands.  Patient is responsive to verbal stimuli.   Psychiatric: Patient exhibits normal mood with appropriate affect.  Patient seems to possess insight as to their current situation.    Data Reviewed:  I have personally reviewed and interpreted labs, imaging.  Significant findings are:  CBC reveals white blood cell count 7.5, hemoglobin 11.2, hematocrit 34.5, platelet count 276. Chemistry revealing sodium 136, glucose 115, BUN 33, creatinine 1.89. ABG reveals pH of 7.31, PCO2 of 40.4, PaO2 of 59 on room air D-dimer 0.39  EKG: Personally reviewed.  Rhythm is normal sinus rhythm with heart rate of 89 bpm.  No dynamic ST segment changes appreciated.   Assessment and Plan: * Acute respiratory failure with hypoxia (HCC) Patient incidentally found to be hypoxic upon arrival with Dr. Marta Lamas in the mid 80s Room air ABG does indeed reveal PaO2 of 59 on room air consistent with mild acute hypoxic respiratory failure Chest x-ray reveals no evidence of obvious acute cardiopulmonary process D-dimer unremarkable Obtaining noncontrast CT imaging of the chest to identify any developing pneumonia that may have been missed on chest x-ray As needed bronchodilator therapy for shortness of breath and wheezing  Acute diarrhea 6-day history of ongoing diarrhea History of longstanding suppressive therapy with daily Keflex places patient at risk for C. difficile Clear liquid diet GI stool panel, C. difficile testing Intravenous volume resuscitation Supportive care otherwise for now unless testing comes back positive  AKI (acute kidney injury) ([place]) Creatinine rising over the past  several weeks to 1.89, up from 0.78 approximately 2 months ago In the setting of ongoing diarrhea this possibly could be due to volume depletion Obtaining urinalysis Obtaining urine electrolytes Avoiding nephrotoxic agents Hydrating patient with intravenous isotonic fluids Monitoring renal function and electrolytes with serial chemistries No evidence of hydronephrosis on CT imaging of the abdomen and pelvis  Dark stools Patient reports intermittent dark stools over the last several days No gross bleeding on exam Hemoglobin is only slightly decreased compared to prior values however this may be obscured due to hemoconcentration We will switch patient to intravenous PPI for now as we evaluate this Holding home regimen of Plavix for now Obtaining serial hemoglobin and hematocrit Obtaining stool Hemoccult SCDs for DVT prophylaxis If hemoglobin continues to downtrend will consider GI consultation  Type 2 diabetes mellitus with diabetic polyneuropathy, with long-term current use of insulin ([place]) Patient been placed on Accu-Cheks before every meal and nightly with sliding scale insulin Holding home regimen of oral hypoglycemics Placing patient on reduced regimen of basal insulin therapy considering tenuous oral intake Hemoglobin A1C 7.9% 02/2022 Advance to diabetic diet once able   Essential hypertension Holding home regimen of Cozaar due to acute kidney injury PRN intravenous antihypertensives for excessively elevated blood pressure    Hypothyroidism Resume home regimen of Synthroid    GERD without esophagitis Continuing home regimen of daily PPI therapy.   Mixed hyperlipidemia due to type 2 diabetes mellitus ([place]) Continuing home regimen of lipid lowering therapy.        Code Status:  DNR  code status decision has been confirmed with: patient Family Communication: Daugther and husband at bedside who have been updated on plan of  care.   Consults: None  Severity of  Illness:  The appropriate patient status for this patient is OBSERVATION. Observation status is judged to be reasonable and necessary in order to provide the required intensity of service to ensure the patient's safety. The patient's presenting symptoms, physical exam findings, and initial radiographic and laboratory data in the context of their medical condition is felt to place them at decreased risk for further clinical deterioration. Furthermore, it is anticipated that the patient will be medically stable for discharge from the hospital within 2 midnights of admission.   Author:  Vernelle Emerald MD  05/04/2022 7:16 AM

## 2022-05-04 NOTE — Assessment & Plan Note (Signed)
   Patient incidentally found to be hypoxic upon arrival with Dr. Marta Lamas in the mid 80s  Room air ABG does indeed reveal PaO2 of 59 on room air consistent with mild acute hypoxic respiratory failure  Chest x-ray reveals no evidence of obvious acute cardiopulmonary process  D-dimer unremarkable  Obtaining noncontrast CT imaging of the chest to identify any developing pneumonia that may have been missed on chest x-ray  As needed bronchodilator therapy for shortness of breath and wheezing

## 2022-05-04 NOTE — Progress Notes (Signed)
NEW ADMISSION NOTE New Admission Note:   Arrival Method: stretcher  Mental Orientation: A&OX3 Telemetry:5M10 Assessment: Completed Skin: intact buttocks red,  IV:LFA Pain:7/10 Tubes: NONE Safety Measures: Safety Fall Prevention Plan has been given, discussed and signed Admission: Completed [facility] Orientation: Patient has been orientated to the room, unit and staff.  Family:daughter at bedside   Orders have been reviewed and implemented. Will continue to monitor the patient. Call light has been placed within reach and bed alarm has been activated.   Sourish Allender S Tatisha Cerino, RN

## 2022-05-04 NOTE — Discharge Instructions (Signed)
Please go directly to the emergency department for further evaluation.

## 2022-05-04 NOTE — ED Provider Notes (Signed)
[facility]PART[facility] Provider Note  CSN: [id number] Arrival date & time: 05/03/22 1949  Chief Complaint(s) Diarrhea and GI Bleeding  HPI Mandy Kim is a 76 y.o. female with a past medical history listed below including hypertension, diabetes, diverticulitis who presents to the emergency department with 4 days of nausea and diarrhea.  Diarrhea has been watery.  On day 2 and 3 she reported melenic stools.  Stools cleared today and are now dark brown.  She is endorsing left lower quadrant abdominal discomfort.  No emesis but reports dry heaving.  She denies any fevers or chills.  No coughing or congestion.  No shortness of breath.  In triage patient was noted to be hypoxic on room air with sats in the mid 80s.  Placed on 3 L nasal cannula.  I confirmed this again during my evaluation.   Patient denies any history of DVT/PEs.  The history is provided by the patient.   Past Medical History Past Medical History:  Diagnosis Date   Anticoagulant long-term use    plavix   Cancer (HCC)    CHF (congestive heart failure) ([place]) 2000   Chronic cystitis    Chronic kidney disease    Diverticulitis    GERD (gastroesophageal reflux disease)    Hiatal hernia    History of basal cell carcinoma excision    face/ nose right side   History of bilateral breast cancer followed by oncologist until 2008,  per pt no recurrence   right 2002  and left 2003 s/p  mastectomy with completed chemo and tamexifen therapy    History of cervical cancer 2000   s/p  TAH w/ BSO   History of diverticulitis of colon    Hyperlipidemia    OSA on CPAP    PAD (peripheral artery disease) (HCC)    Peripheral neuropathy    PONV (postoperative nausea and vomiting)    S/P AAA (abdominal aortic aneurysm) repair 09/15/2011   Stress incontinence due to pelvic organ prolapse    Type 2 diabetes mellitus treated with insulin ([place])    followed by pcp   Wears glasses    Patient Active Problem  List   Diagnosis Date Noted   Myositis, unspecified 04/27/2020   Prolapse of female pelvic organs 05/24/2019   Cystocele with prolapse 05/23/2019   Hypoxia    Sepsis ([place]) 05/28/2017   Elevated LFTs 05/28/2017   Acute lower UTI 05/28/2017   Dehydration 05/28/2017   Peripheral neuropathy 07/27/2014   AAA (abdominal aortic aneurysm) ([place]) 02/12/2012   Fatty liver 02/12/2012   Breast cancer ([place]) 02/12/2012   Cervical cancer ([place]) 02/12/2012   Diabetes mellitus ([place]) 02/12/2012   Hyperlipidemia 02/12/2012   Home Medication(s) Prior to Admission medications   Medication Sig Start Date End Date Taking? Authorizing Provider  Accu-Chek Softclix Lancets lancets TEST BLOOD SUGAR THREE TIMES DAILY 07/20/21   Copland, Gay Filler, MD  Alcohol Swabs (DROPSAFE ALCOHOL PREP) 70 % PADS USE TO TEST BLOOD SUGAR 3 TIMES DAILY. 07/20/21   Copland, Gay Filler, MD  Alcohol Swabs PADS Use to test blood sugar 3 times daily. Dx: E11.9 01/14/19   Copland, Gay Filler, MD  b complex vitamins tablet Take 0.5 tablets by mouth 2 (two) times daily.     [provider]  Biotin 5 MG TABS Take 5,000 mcg by mouth at bedtime.     [provider]  Blood Glucose Calibration (GLUCOSE CONTROL) SOLN Use to test blood sugar 3 times daily. Dx:  E11.9 12/20/15   Copland, Gay Filler, MD  Blood Glucose Monitoring Suppl (ACCU-CHEK AVIVA PLUS) w/Device KIT USE AS DIRECTED  TO TEST BLOOD GLUCOSE BID E11.9 05/11/21   Copland, Gay Filler, MD  cephALEXin (KEFLEX) 250 MG capsule Take 1 capsule (250 mg total) by mouth daily. Daily prophylaxis per urology 02/28/21   Copland, Gay Filler, MD  clopidogrel (PLAVIX) 75 MG tablet TAKE 1 TABLET EVERY DAY 02/10/22   Copland, Gay Filler, MD  Cranberry 500 MG TABS Take 500 mg by mouth daily.    [provider]  gabapentin (NEURONTIN) 100 MG capsule TAKE 1 CAPSULE TWICE DAILY 05/03/21   Copland, Gay Filler, MD  gemfibrozil (LOPID) 600 MG tablet TAKE 1 TABLET TWICE DAILY BEFORE MEALS 01/27/22    Copland, Gay Filler, MD  glipiZIDE (GLUCOTROL) 5 MG tablet TAKE 1 TABLET TWICE DAILY BEFORE A MEAL 11/14/21   Copland, Gay Filler, MD  glucose blood (ACCU-CHEK AVIVA PLUS) test strip TEST BLOOD SUGAR THREE TIMES DAILY 05/25/21   Copland, Gay Filler, MD  Insulin Pen Needle 32G X 4 MM MISC Used to inject insulin 1x daily. 10/18/17   Renato Shin, MD  Insulin Syringe-Needle U-100 (INSULIN SYRINGE 1CC/30GX5/16") 30G X 5/16" 1 ML MISC Use to inject insulin 1 time per day. 05/22/17   Renato Shin, MD  levothyroxine (SYNTHROID) 50 MCG tablet Take 1 tablet (50 mcg total) by mouth daily before breakfast. 12/26/21   Copland, Gay Filler, MD  losartan (COZAAR) 50 MG tablet Take 1 tablet (50 mg total) by mouth daily. 04/10/22   Copland, Gay Filler, MD  magnesium oxide (MAG-OX) 400 MG tablet Take 200 mg by mouth daily.    [provider]  Menthol, Topical Analgesic, (BIOFREEZE ROLL-ON EX) Apply 1 application topically as needed (joint pain).    [provider]  metFORMIN (GLUCOPHAGE) 1000 MG tablet TAKE 1 TABLET TWICE DAILY WITH MEALS 03/13/22   Copland, Gay Filler, MD  Multiple Vitamin (MULTIVITAMIN) tablet Take 0.5 tablets by mouth 2 (two) times daily.     [provider]  niacin (NIASPAN) 500 MG CR tablet Take 1 tablet (500 mg total) by mouth at bedtime. 08/25/15   Copland, Gay Filler, MD  Omega-3 Fatty Acids (FISH OIL) 1000 MG CAPS Take 1,000 mg by mouth 2 (two) times a day.     [provider]  pantoprazole (PROTONIX) 40 MG tablet Take 1 tablet (40 mg total) by mouth daily. 01/02/22   Copland, Gay Filler, MD  potassium chloride SA (KLOR-CON M) 20 MEQ tablet TAKE 1 TABLET EVERY DAY 02/07/22   Copland, Gay Filler, MD  Probiotic Product (PROBIOTIC DAILY PO) Take 1 capsule by mouth daily.    [provider]  TRESIBA FLEXTOUCH 100 UNIT/ML FlexTouch Pen INJECT  60 UNITS EVERY DAY AT BEDTIME 11/14/21   Copland, Gay Filler, MD  VITAMIN D PO Take 1 capsule by mouth daily.     [provider]  zinc gluconate 50 MG tablet Take 50 mg by mouth at bedtime.    [provider]  Allergies Aspirin, Bee venom, Ibuprofen, Ozempic (0.25 or 0.5 mg-dose) [semaglutide(0.25 or 0.71m-dos)], Poison oak extract, Statins, Trulicity [dulaglutide], Tylenol [acetaminophen], and Victoza [liraglutide]  Review of Systems Review of Systems As noted in HPI  Physical Exam Vital Signs  I have reviewed the triage vital signs BP (!) 97/53 (BP Location: Left Arm)   Pulse 84   Temp 98.9 F (37.2 C)   Resp 15   SpO2 93%   Physical Exam Vitals reviewed.  Constitutional:      General: She is not in acute distress.    Appearance: She is well-developed. She is not diaphoretic.  HENT:     Head: Normocephalic and atraumatic.     Right Ear: External ear normal.     Left Ear: External ear normal.     Nose: Nose normal.  Eyes:     General: No scleral icterus.       Right eye: No discharge.        Left eye: No discharge.     Conjunctiva/sclera: Conjunctivae normal.     Pupils: Pupils are equal, round, and reactive to light.  Neck:     Trachea: Phonation normal.  Cardiovascular:     Rate and Rhythm: Normal rate and regular rhythm.     Heart sounds: No murmur heard.   No friction rub. No gallop.  Pulmonary:     Effort: Pulmonary effort is normal. No respiratory distress.     Breath sounds: Normal breath sounds. No stridor. No rales.  Abdominal:     General: There is distension.     Palpations: Abdomen is soft.     Tenderness: There is abdominal tenderness in the left lower quadrant. There is no guarding or rebound.  Musculoskeletal:        General: No tenderness. Normal range of motion.     Cervical back: Normal range of motion and neck supple.  Skin:    General: Skin is warm and dry.     Findings: No erythema or rash.  Neurological:      Mental Status: She is alert and oriented to person, place, and time.  Psychiatric:        Behavior: Behavior normal.    ED Results and Treatments Labs (all labs ordered are listed, but only abnormal results are displayed) Labs Reviewed  CBC WITH DIFFERENTIAL/PLATELET - Abnormal; Notable for the following components:      Result Value   Hemoglobin 11.2 (*)    HCT 34.5 (*)    RDW 16.1 (*)    nRBC 0.3 (*)    Abs Immature Granulocytes 0.10 (*)    All other components within normal limits  COMPREHENSIVE METABOLIC PANEL - Abnormal; Notable for the following components:   CO2 20 (*)    Glucose, Bld 115 (*)    BUN 33 (*)    Creatinine, Ser 1.89 (*)    AST 49 (*)    Alkaline Phosphatase 37 (*)    GFR, Estimated 27 (*)    All other components within normal limits  LIPASE, BLOOD - Abnormal; Notable for the following components:   Lipase 78 (*)    All other components within normal limits  URINALYSIS, ROUTINE W REFLEX MICROSCOPIC  EKG  EKG Interpretation  Date/Time:  Wednesday May 03 2022 23:40:21 EDT Ventricular Rate:  85 PR Interval:  190 QRS Duration: 88 QT Interval:  378 QTC Calculation: 449 R Axis:   82 Text Interpretation: Normal sinus rhythm No acute changes When compared with ECG of 29-May-2017 05:27, PREVIOUS ECG IS PRESENT Confirmed by Addison Lank [phone]) on 05/04/2022 12:43:45 AM       Radiology No results found.  Pertinent labs & imaging results that were available during my care of the patient were reviewed by me and considered in my medical decision making (see MDM for details).  Medications Ordered in ED Medications  sodium chloride 0.9 % bolus 1,000 mL (has no administration in time range)    Followed by  0.9 %  sodium chloride infusion (has no administration in time range)                                                                                                                                      Procedures .Critical Care Performed by: Fatima Blank, MD Authorized by: Fatima Blank, MD   Critical care provider statement:    Critical care time (minutes):  45   Critical care time was exclusive of:  Separately billable procedures and treating other patients   Critical care was necessary to treat or prevent imminent or life-threatening deterioration of the following conditions:  Respiratory failure   Critical care was time spent personally by me on the following activities:  Development of treatment plan with patient or surrogate, discussions with consultants, evaluation of patient's response to treatment, examination of patient, obtaining history from patient or surrogate, review of old charts, re-evaluation of patient's condition, pulse oximetry, ordering and review of radiographic studies, ordering and review of laboratory studies and ordering and performing treatments and interventions   Care discussed with: admitting provider    (including critical care time)  Medical Decision Making / ED Course    Complexity of Problem:  Co-morbidities/SDOH that complicate the patient evaluation/care: Noted above in HPI  Patient's presenting problem/concern, DDX, and MDM listed below: Left lower quadrant abdominal pain with diarrhea and melenic stools We will need to assess for evidence of serious intra-abdominal inflammatory/infectious process specifically diverticulitis.  Given the melenic stools also considering upper GI source including ulcers, gastritis or proximal enteritis. Hypoxia 87% on room air. Patient does not have history of COPD.  She does have a remote history of CHF with a last EF of 55% noted on echo in 2013.  No echo since patient does not appear to be volume overloaded on exam, that would be concerning for heart failure. We will assess for pneumonia. Also considering pulmonary  embolism   Hospitalization Considered:  Yes     Complexity of Data:   Cardiac Monitoring: The patient was maintained on a cardiac monitor.   I personally viewed and interpreted the cardiac monitored which showed an  underlying rhythm of normal sinus rhythm with rates in the 70s to 80s EKG without acute ischemic changes or evidence of pericarditis  Laboratory Tests ordered listed below with my independent interpretation: CBC without leukocytosis.  Mild anemia. No significant electrolyte derangements.  Patient does have AKI. No evidence of biliary obstruction.  Mildly elevated lipase but not consistent with acute pancreatitis   Imaging Studies ordered listed below with my independent interpretation: Given renal function, CT Noncon was ordered.  I did not appreciate evidence of diverticulitis. Radiology confirmed the above.  They were able to see diverticulosis without active diverticulitis. Chest x-ray without evidence of pneumonia, pneumothorax, pulmonary edema.      ED Course:    Assessment, Add'l Intervention, and Reassessment: Abd with diarrhea Work up is reassuring Treat symptomatically  Hypoxia Chest x-ray reassuring Etiology of hypoxia undetermined at this time It was confirmed by ABG. Dimer ordered to rule out PE and negative. I discussed case with Dr. Cyd Silence who agreed to admit patient for further work-up and management  Final Clinical Impression(s) / ED Diagnoses Final diagnoses:  Hypoxia  Diarrhea of presumed infectious origin  Abdominal discomfort           This chart was dictated using voice recognition software.  Despite best efforts to proofread,  errors can occur which can change the documentation meaning.    Fatima Blank, MD 05/04/22 [phone]

## 2022-05-04 NOTE — Assessment & Plan Note (Addendum)
   6-day history of ongoing diarrhea  History of longstanding suppressive therapy with daily Keflex places patient at risk for C. difficile  Clear liquid diet  GI stool panel, C. difficile testing  Intravenous volume resuscitation  Supportive care otherwise for now unless testing comes back positive

## 2022-05-04 NOTE — Assessment & Plan Note (Signed)
   Creatinine rising over the past several weeks to 1.89, up from 0.78 approximately 2 months ago  In the setting of ongoing diarrhea this possibly could be due to volume depletion  Obtaining urinalysis  Obtaining urine electrolytes  Avoiding nephrotoxic agents  Hydrating patient with intravenous isotonic fluids  Monitoring renal function and electrolytes with serial chemistries  No evidence of hydronephrosis on CT imaging of the abdomen and pelvis

## 2022-05-05 ENCOUNTER — Other Ambulatory Visit (HOSPITAL_COMMUNITY): Payer: Self-pay

## 2022-05-05 ENCOUNTER — Observation Stay (HOSPITAL_COMMUNITY): Payer: Medicare HMO

## 2022-05-05 DIAGNOSIS — I129 Hypertensive chronic kidney disease with stage 1 through stage 4 chronic kidney disease, or unspecified chronic kidney disease: Secondary | ICD-10-CM | POA: Diagnosis present

## 2022-05-05 DIAGNOSIS — E039 Hypothyroidism, unspecified: Secondary | ICD-10-CM | POA: Diagnosis present

## 2022-05-05 DIAGNOSIS — E1151 Type 2 diabetes mellitus with diabetic peripheral angiopathy without gangrene: Secondary | ICD-10-CM | POA: Diagnosis present

## 2022-05-05 DIAGNOSIS — G4733 Obstructive sleep apnea (adult) (pediatric): Secondary | ICD-10-CM | POA: Diagnosis present

## 2022-05-05 DIAGNOSIS — Z79810 Long term (current) use of selective estrogen receptor modulators (SERMs): Secondary | ICD-10-CM | POA: Diagnosis not present

## 2022-05-05 DIAGNOSIS — K219 Gastro-esophageal reflux disease without esophagitis: Secondary | ICD-10-CM | POA: Diagnosis present

## 2022-05-05 DIAGNOSIS — E1142 Type 2 diabetes mellitus with diabetic polyneuropathy: Secondary | ICD-10-CM | POA: Diagnosis present

## 2022-05-05 DIAGNOSIS — N179 Acute kidney failure, unspecified: Secondary | ICD-10-CM | POA: Diagnosis present

## 2022-05-05 DIAGNOSIS — J439 Emphysema, unspecified: Secondary | ICD-10-CM | POA: Diagnosis present

## 2022-05-05 DIAGNOSIS — E1169 Type 2 diabetes mellitus with other specified complication: Secondary | ICD-10-CM | POA: Diagnosis present

## 2022-05-05 DIAGNOSIS — A0811 Acute gastroenteropathy due to Norwalk agent: Secondary | ICD-10-CM | POA: Diagnosis present

## 2022-05-05 DIAGNOSIS — J9601 Acute respiratory failure with hypoxia: Secondary | ICD-10-CM | POA: Diagnosis present

## 2022-05-05 DIAGNOSIS — Z20822 Contact with and (suspected) exposure to covid-19: Secondary | ICD-10-CM | POA: Diagnosis present

## 2022-05-05 DIAGNOSIS — Z794 Long term (current) use of insulin: Secondary | ICD-10-CM | POA: Diagnosis not present

## 2022-05-05 DIAGNOSIS — E1122 Type 2 diabetes mellitus with diabetic chronic kidney disease: Secondary | ICD-10-CM | POA: Diagnosis present

## 2022-05-05 DIAGNOSIS — Z66 Do not resuscitate: Secondary | ICD-10-CM | POA: Diagnosis present

## 2022-05-05 DIAGNOSIS — N1832 Chronic kidney disease, stage 3b: Secondary | ICD-10-CM | POA: Diagnosis present

## 2022-05-05 DIAGNOSIS — Z853 Personal history of malignant neoplasm of breast: Secondary | ICD-10-CM | POA: Diagnosis not present

## 2022-05-05 DIAGNOSIS — Z792 Long term (current) use of antibiotics: Secondary | ICD-10-CM | POA: Diagnosis not present

## 2022-05-05 DIAGNOSIS — A0472 Enterocolitis due to Clostridium difficile, not specified as recurrent: Secondary | ICD-10-CM | POA: Diagnosis present

## 2022-05-05 DIAGNOSIS — E782 Mixed hyperlipidemia: Secondary | ICD-10-CM | POA: Diagnosis present

## 2022-05-05 DIAGNOSIS — R197 Diarrhea, unspecified: Secondary | ICD-10-CM | POA: Diagnosis present

## 2022-05-05 DIAGNOSIS — R0902 Hypoxemia: Principal | ICD-10-CM | POA: Diagnosis present

## 2022-05-05 DIAGNOSIS — A045 Campylobacter enteritis: Secondary | ICD-10-CM | POA: Diagnosis present

## 2022-05-05 HISTORY — DX: Hypoxemia: R09.02

## 2022-05-05 LAB — COMPREHENSIVE METABOLIC PANEL
ALT: 40 U/L (ref 0–44)
AST: 50 U/L — ABNORMAL HIGH (ref 15–41)
Albumin: 3.1 g/dL — ABNORMAL LOW (ref 3.5–5.0)
Alkaline Phosphatase: 36 U/L — ABNORMAL LOW (ref 38–126)
Anion gap: 9 (ref 5–15)
BUN: 34 mg/dL — ABNORMAL HIGH (ref 8–23)
CO2: 20 mmol/L — ABNORMAL LOW (ref 22–32)
Calcium: 8.8 mg/dL — ABNORMAL LOW (ref 8.9–10.3)
Chloride: 109 mmol/L (ref 98–111)
Creatinine, Ser: 2.02 mg/dL — ABNORMAL HIGH (ref 0.44–1.00)
GFR, Estimated: 25 mL/min — ABNORMAL LOW (ref >60)
Glucose, Bld: 99 mg/dL (ref 70–99)
Potassium: 3.8 mmol/L (ref 3.5–5.1)
Sodium: 138 mmol/L (ref 135–145)
Total Bilirubin: 0.7 mg/dL (ref 0.3–1.2)
Total Protein: 6.7 g/dL (ref 6.5–8.1)

## 2022-05-05 LAB — URINALYSIS, ROUTINE W REFLEX MICROSCOPIC
Bilirubin Urine: NEGATIVE
Glucose, UA: 50 mg/dL — AB
Hgb urine dipstick: NEGATIVE
Ketones, ur: NEGATIVE mg/dL
Leukocytes,Ua: NEGATIVE
Nitrite: NEGATIVE
Protein, ur: NEGATIVE mg/dL
Specific Gravity, Urine: 1.009 (ref 1.005–1.030)
pH: 5 (ref 5.0–8.0)

## 2022-05-05 LAB — GASTROINTESTINAL PANEL BY PCR, STOOL (REPLACES STOOL CULTURE)
Adenovirus F40/41: NOT DETECTED
Astrovirus: NOT DETECTED
Campylobacter species: DETECTED — AB
Cryptosporidium: NOT DETECTED
Cyclospora cayetanensis: NOT DETECTED
Entamoeba histolytica: NOT DETECTED
Enteroaggregative E coli (EAEC): NOT DETECTED
Enteropathogenic E coli (EPEC): NOT DETECTED
Enterotoxigenic E coli (ETEC): NOT DETECTED
Giardia lamblia: NOT DETECTED
Norovirus GI/GII: DETECTED — AB
Plesimonas shigelloides: NOT DETECTED
Rotavirus A: NOT DETECTED
Salmonella species: NOT DETECTED
Sapovirus (I, II, IV, and V): NOT DETECTED
Shiga like toxin producing E coli (STEC): NOT DETECTED
Shigella/Enteroinvasive E coli (EIEC): NOT DETECTED
Vibrio cholerae: NOT DETECTED
Vibrio species: NOT DETECTED
Yersinia enterocolitica: NOT DETECTED

## 2022-05-05 LAB — GLUCOSE, CAPILLARY
Glucose-Capillary: 113 mg/dL — ABNORMAL HIGH (ref 70–99)
Glucose-Capillary: 185 mg/dL — ABNORMAL HIGH (ref 70–99)
Glucose-Capillary: 201 mg/dL — ABNORMAL HIGH (ref 70–99)
Glucose-Capillary: 116 mg/dL — ABNORMAL HIGH (ref 70–99)

## 2022-05-05 LAB — MAGNESIUM: Magnesium: 2.3 mg/dL (ref 1.7–2.4)

## 2022-05-05 IMAGING — US US RENAL
1 series · 14 of 25 positions shown · non-contrast
Comparison: CT of the abdomen pelvis [DATE]

CLINICAL DATA: Acute kidney injury.

EXAM:
RENAL / URINARY TRACT ULTRASOUND COMPLETE

[Series 1: us renal · 14 of 29 slices shown]
[im 1/29]
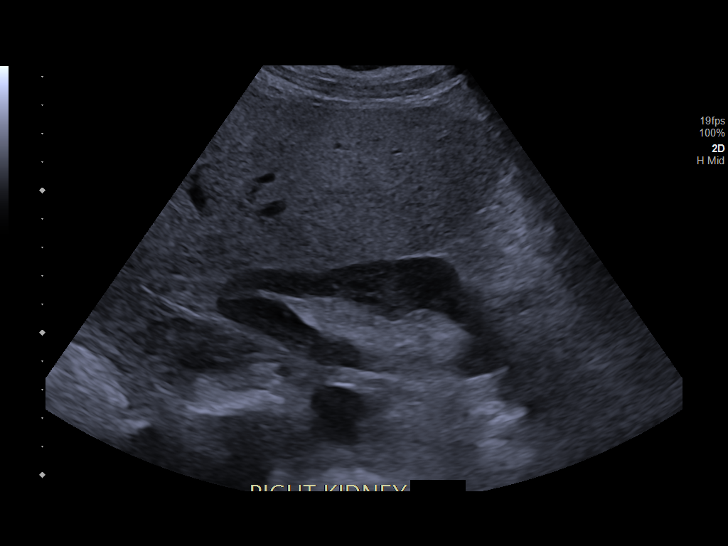
[im 3/29]
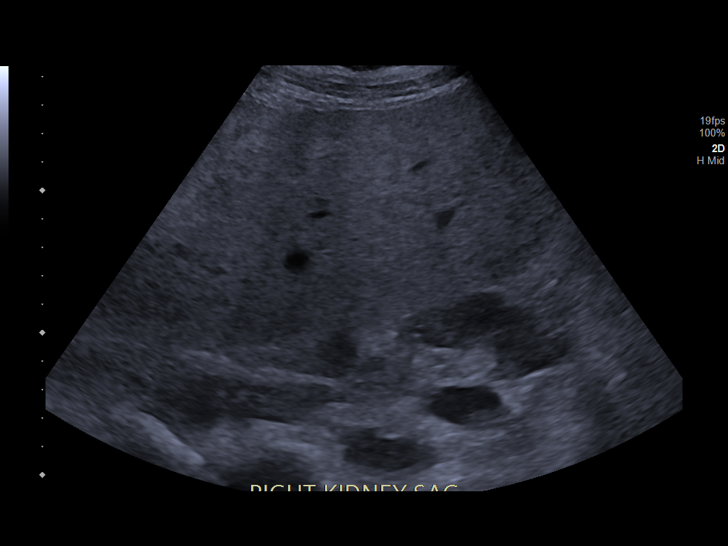
[im 5/29]
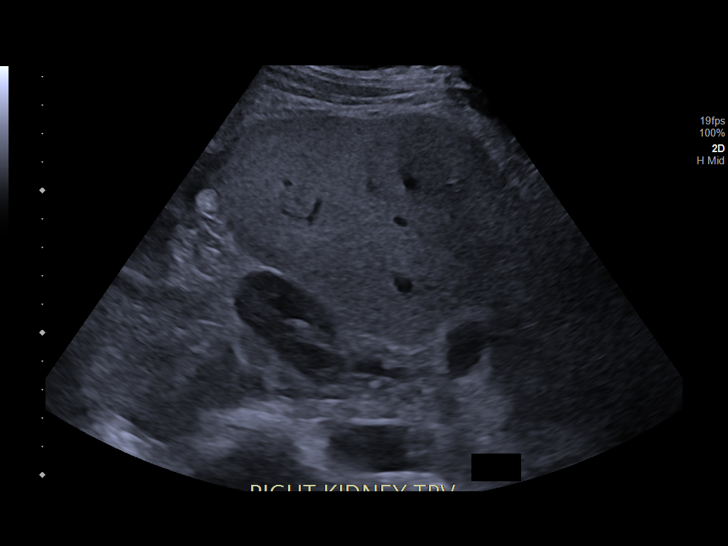
[im 8/29]
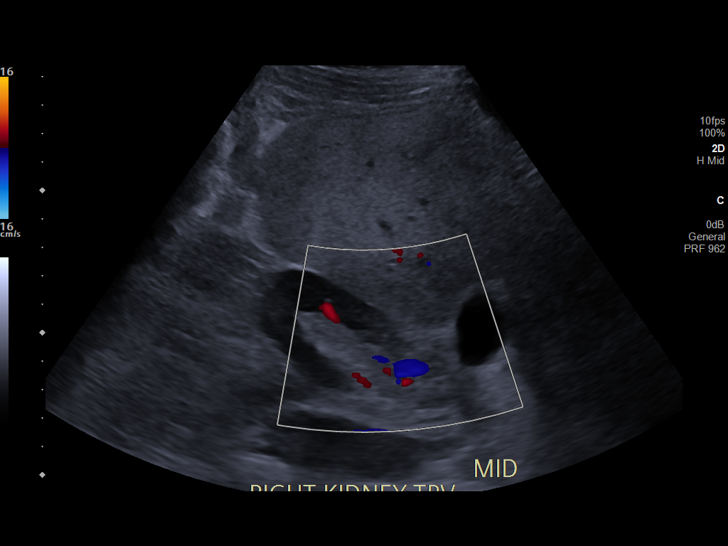
[im 10/29]
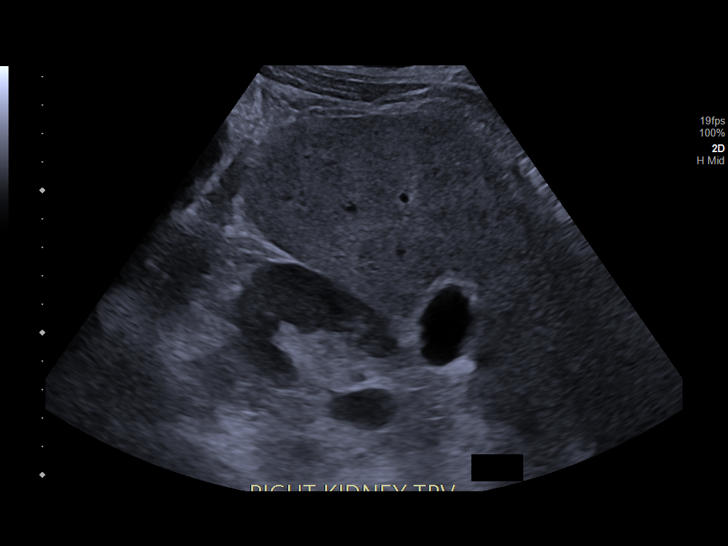
[im 11/29]
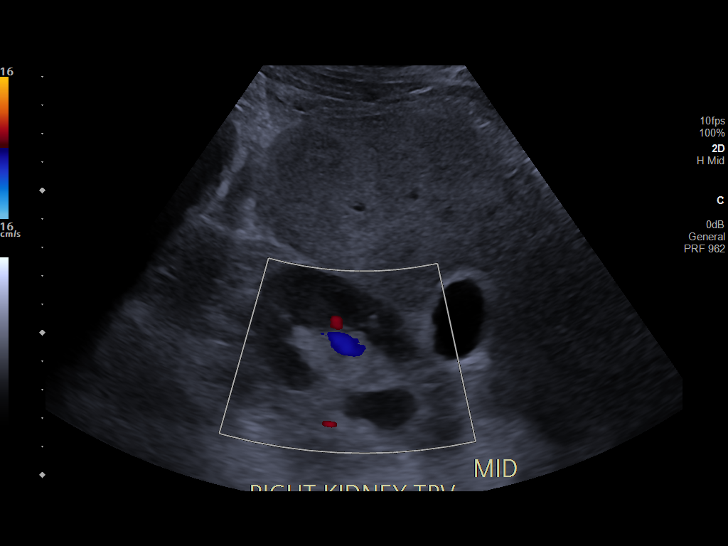
[im 13/29]
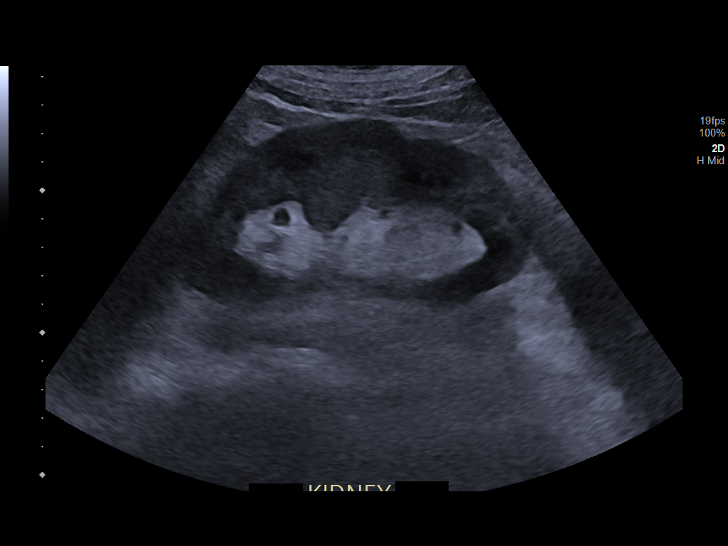
[im 16/29]
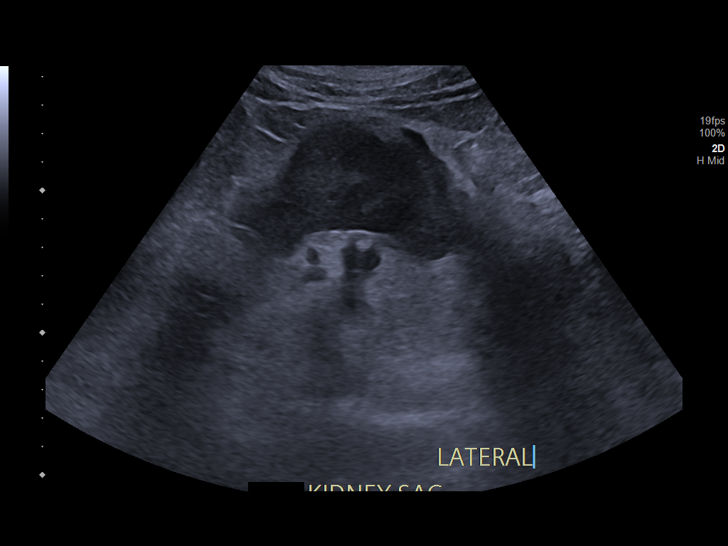
[im 18/29]
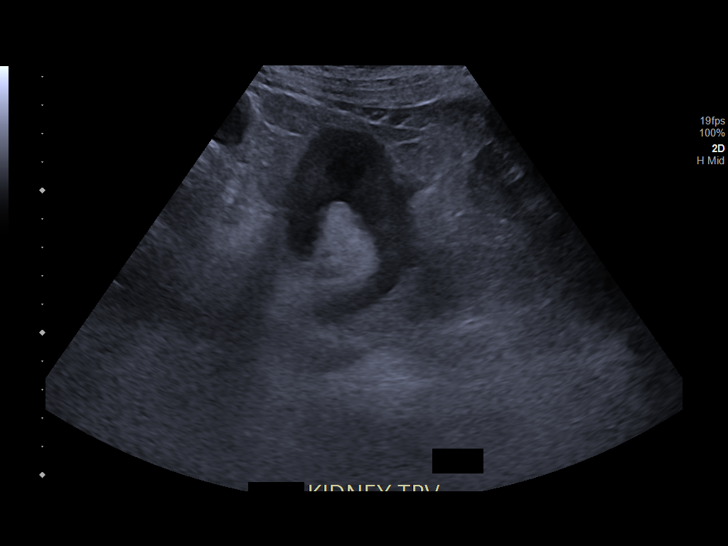
[im 19/29]
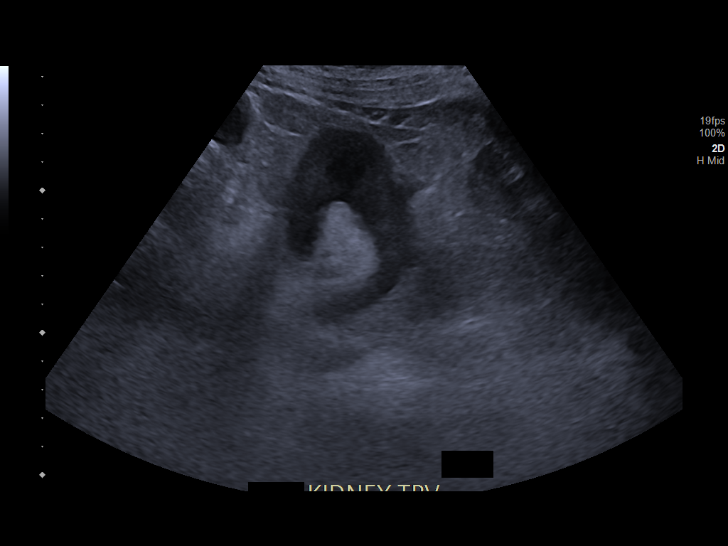
[im 22/29]
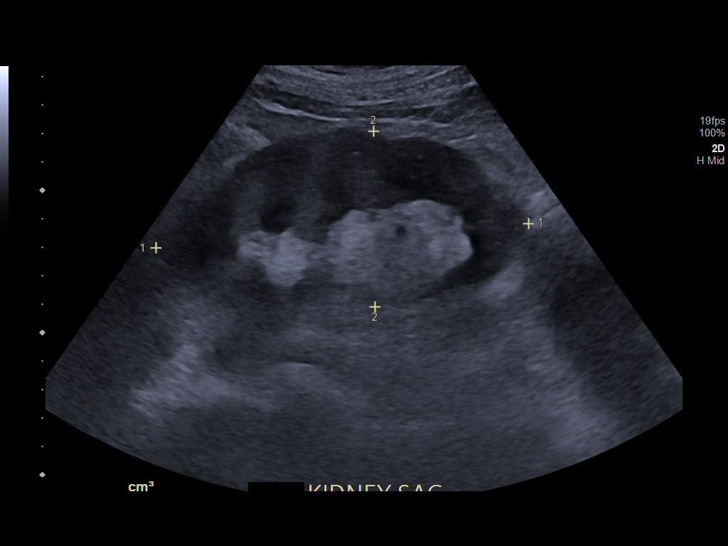
[im 24/29]
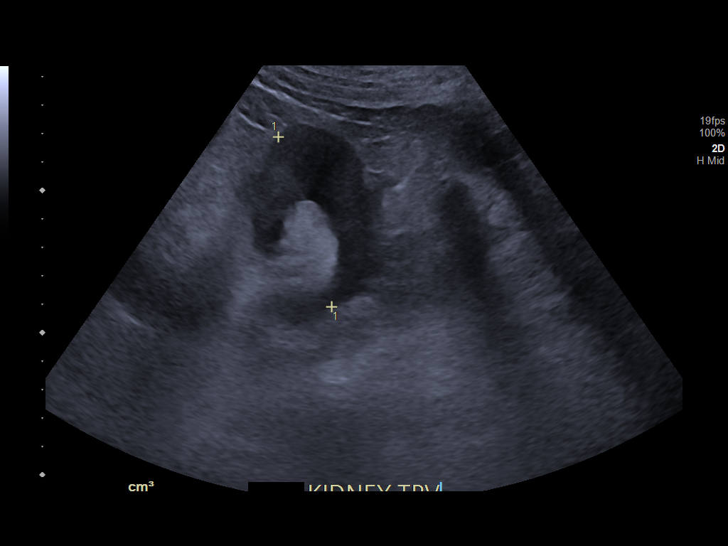
[im 26/29]
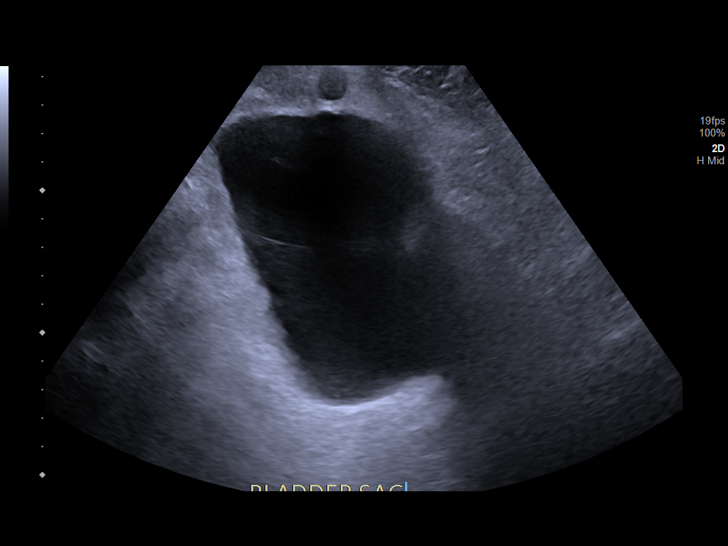
[im 29/29]
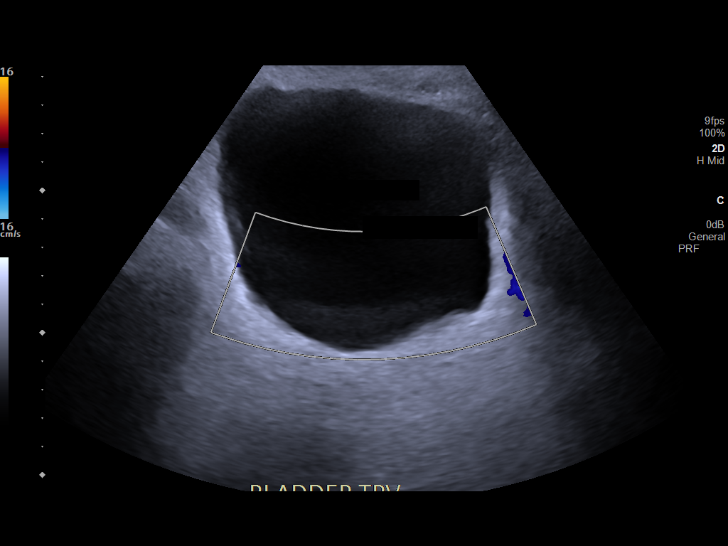

[14 of 25 positions shown; findings below may reference images not displayed]

FINDINGS: Right Kidney:

Renal measurements: 10.4 x 4.4 x 3.3 cm = volume: 82 mL.
Echogenicity within normal limits. No mass or hydronephrosis
visualized.

Left Kidney:

Renal measurements: 13.1 x 6.3 x 6.3 cm = volume: 171 mL.
Echogenicity within normal limits. No mass or hydronephrosis
visualized.

Bladder:

Appears normal for degree of bladder distention. Right ureteral jet
not seen.

Other:

None.
IMPRESSION: 1. Unremarkable renal ultrasound.

## 2022-05-05 MED ORDER — SODIUM CHLORIDE 0.9 % IV SOLN: 1000.0000 mL | INTRAVENOUS | Status: DC

## 2022-05-05 MED ORDER — AZITHROMYCIN 500 MG PO TABS
500.0000 mg | ORAL_TABLET | Freq: Every day | ORAL | Status: DC
  Administered 2022-05-05 – 2022-05-07 (×3): 500 mg via ORAL
  Filled 2022-05-05 (×3): qty 1

## 2022-05-05 MED ORDER — FAMOTIDINE 20 MG PO TABS
20.0000 mg | ORAL_TABLET | Freq: Every day | ORAL | Status: DC
  Administered 2022-05-05 – 2022-05-07 (×3): 20 mg via ORAL
  Filled 2022-05-05 (×3): qty 1

## 2022-05-05 MED ORDER — GLUCERNA SHAKE PO LIQD: 237.0000 mL | ORAL | Status: DC

## 2022-05-05 MED ORDER — LACTATED RINGERS IV SOLN: INTRAVENOUS | Status: DC

## 2022-05-05 MED ORDER — ADULT MULTIVITAMIN W/MINERALS CH
1.0000 | ORAL_TABLET | Freq: Every day | ORAL | Status: DC
  Administered 2022-05-05 – 2022-05-07 (×3): 1 via ORAL
  Filled 2022-05-05 (×3): qty 1

## 2022-05-05 NOTE — Progress Notes (Signed)
Initial Nutrition Assessment  DOCUMENTATION CODES:  Not applicable  INTERVENTION:  Continue current diet as tolerated MVI with minerals daily Glucerna Shake po 1x/d, each supplement provides 220 kcal and 10 grams of protein Obtain height and weight  NUTRITION DIAGNOSIS:  Inadequate oral intake related to poor appetite as evidenced by per patient/family report.  GOAL:  Patient will meet greater than or equal to 90% of their needs  MONITOR:  PO intake, Supplement acceptance, Labs  REASON FOR ASSESSMENT:  Malnutrition Screening Tool    ASSESSMENT:  Pt with hx of HTN, HLD, PAD, CHF, CKD, GERD, DM, hx breast cancer and cervical cancer (s/p TAH w/ BSO), and diverticulitis presented to ED with diarrhea and abdominal pain x ~1 week PTA. In ED, pt found to be hypoxic and with an AKI.  Pt resting in bed at the time of assessment. Daughter at bedside. Reports that pt's diet was just advanced and she is going to grab her something to eat. Pt reports that typically, her appetite is good at home. Eats 2-3x/d and snacks. Mostly drinks sprite zero.  Pt reports that she lives at home with her husband and dog.   Symptoms started ~4 days ago and was found to have Clostridium difficile this admission. Pt looking forward to eating, states she is hungry. Has never had a nutrition supplement before.   Nutritionally Relevant Medications: Scheduled Meds:  insulin aspart  0-15 Units Subcutaneous TID AC & HS   insulin glargine-yfgn  30 Units Subcutaneous QHS   niacin  500 mg Oral QHS   omega-3 acid ethyl esters  1 g Oral BID   pantoprazole IV  40 mg Intravenous Q12H   vancomycin  125 mg Oral QID   Continuous Infusions:  lactated ringers 50 mL/hr at 05/05/22 0856   PRN Meds: ondansetron   Labs Reviewed: BUN 34, creatinine 2.02 HgbA1c 7.9%  NUTRITION - FOCUSED PHYSICAL EXAM: Flowsheet Row Most Recent Value  Orbital Region Mild depletion  Upper Arm Region No depletion  Thoracic and Lumbar  Region No depletion  Buccal Region Mild depletion  Temple Region Mild depletion  Clavicle Bone Region No depletion  Clavicle and Acromion Bone Region No depletion  Scapular Bone Region No depletion  Dorsal Hand No depletion  Patellar Region No depletion  Anterior Thigh Region No depletion  Posterior Calf Region No depletion  Edema (RD Assessment) None  Hair Reviewed  Eyes Reviewed  Mouth Reviewed  Skin Reviewed  Nails Reviewed   Diet Order:   Diet Order             DIET DYS 3 Room service appropriate? Yes; Fluid consistency: Thin  Diet effective now                   EDUCATION NEEDS:  Education needs have been addressed  Skin:  Skin Assessment: Reviewed RN Assessment  Last BM:  6/1  Height:  Ht Readings from Last 1 Encounters:  04/11/22 '5\' 4"'$  (1.626 m)   Weight:  Wt Readings from Last 1 Encounters:  04/11/22 81.6 kg    Ideal Body Weight:  54.5 kg  BMI:  There is no height or weight on file to calculate BMI.  Estimated Nutritional Needs:  Kcal:  1500-1700 kcal/d Protein:  75-90 g/d Fluid:  1.5-1.7 L/d   Ranell Patrick, RD, LDN Clinical Dietitian RD pager # available in [facility]  After hours/weekend pager # available in [facility]

## 2022-05-05 NOTE — Progress Notes (Signed)
PROGRESS NOTE    Mandy Kim  IOE:[id number] DOB: 09/17/1946 DOA: 05/03/2022 PCP: Darreld Mclean, MD    Chief Complaint  Patient presents with   Diarrhea   GI Bleeding    Brief Narrative:     76 year old female with past medical history of insulin-dependent diabetes mellitus type 2, hyperlipidemia, hypertension, gastroesophageal reflux disease, remote history of bilateral breast cancer who presents to [facility] emergency department with diarrhea and abdominal discomfort.   Patient explains that approximately 6 days ago she began to experience abdominal discomfort.  This abdominal discomfort has been sharp to crampy in quality, moderate in intensity and generalized.  Abdominal discomfort has been associated with bouts of watery diarrhea occurring several times daily.  Patient denies any associated nausea or vomiting.  Patient denies any consumption of undercooked foods, recent travel or sick contacts with similar symptoms.  Of note, patient is on chronic suppressive antibiotic therapy with Keflex per her urologist   Patient symptoms continue to persist over the next several days.  Patient also reports that over the span of time her stools were transiently dark to black in color.  These seem to have resolved after several days and her watery stools are now brown in color once again.  Patient eventually presented to [facility] emergency department for evaluation due to persisting symptoms.   Upon evaluation in the emergency department on arrival patient was found to be hypoxic with oxygen saturations in the 80s.  Patient had to be placed on 3 L of oxygen via nasal cannula.  Oxygen requirement was confirmed with obtaining a room air ABG revealing a PaO2 of 59.  Patient was also found to be developing acute kidney injury with creatinine currently 1.89, up from 0.782 months ago.  Patient was initiated on intravenous fluids with sodium chloride and the hospitalist group was  then called to assess the patient for admission to the hospital.    Assessment & Plan:   Principal Problem:   Acute respiratory failure with hypoxia ([place]) Active Problems:   Acute diarrhea   AKI (acute kidney injury) ([place])   Dark stools   Type 2 diabetes mellitus with diabetic polyneuropathy, with long-term current use of insulin ([facility])   Essential hypertension   Hypothyroidism   GERD without esophagitis   Mixed hyperlipidemia due to type 2 diabetes mellitus ([place])   Acute respiratory failure with hypoxia ([place]) - Patient incidentally found to be hypoxic upon arrival with Dr. Marta Lamas in the mid 80s - Room air ABG does indeed reveal PaO2 of 59 on room air consistent with mild acute hypoxic respiratory failure - Chest x-ray reveals no evidence of obvious acute cardiopulmonary process - D-dimer unremarkable -CT chest with evidence of mild emphysema, does not appear to be stable exacerbation, may end up going home with oxygen.  Campylobacter colitis -She presents with significant diarrhea for last 4 to 5 days, work-up significant for Campylobacter, will start on azithromycin 500 mg oral x3 days.  C. difficile diarrhea -She is on chronic Keflex suppression therapy -Her C. difficile toxin PCR is positive, started on vancomycin. -Avoid PPI. -Will discontinue Keflex  AKI (acute kidney injury) ([place]) - Creatinine rising over the past several weeks to 1.89, up from 0.78 approximately 2 months ago -Creatinine is improving, continue with IV fluids as appetite remains poor.  Type 2 diabetes mellitus with diabetic polyneuropathy, with long-term current use of insulin ([place]) Patient been placed on Accu-Cheks before every meal and nightly with sliding scale insulin  Holding home regimen of oral hypoglycemics Placing patient on reduced regimen of basal insulin therapy considering tenuous oral intake Hemoglobin A1C 7.9% 02/2022 Advance to diabetic diet once able     Essential  hypertension Holding home regimen of Cozaar due to acute kidney injury PRN intravenous antihypertensives for excessively elevated blood pressure   Hypothyroidism Resume home regimen of Synthroid   GERD without esophagitis C hold on Protonix given C. difficile diarrhea,OMG   Mixed hyperlipidemia due to type 2 diabetes mellitus ([place]) Continuing home regimen of lipid lowering therapy.             DVT prophylaxis: SCD's Code Status: Full Family Communication: Husband at bedside Disposition:   Status is: inpatient   Consultants:  none  Subjective:  Reports her diarrhea has improved, still reports nausea, poor appetite  Objective: Vitals:   05/04/22 1750 05/04/22 2102 05/05/22 0558 05/05/22 0959  BP: (!) 120/48 (!) 109/47 (!) 108/48 (!) 108/43  Pulse: 70 74 68 71  Resp: '17 18 18 19  '$ Temp: 98.9 F (37.2 C) 99.4 F (37.4 C) 98.7 F (37.1 C) 99.5 F (37.5 C)  TempSrc: Oral Oral Oral Oral  SpO2: 97% 95% (!) 89% 91%    Intake/Output Summary (Last 24 hours) at 05/05/2022 1423 Last data filed at 05/05/2022 0857 Gross per 24 hour  Intake 2329.85 ml  Output 900 ml  Net 1429.85 ml   There were no vitals filed for this visit.  Examination:  Awake Alert, Oriented X 3, frail  symmetrical Chest wall movement, Good air movement bilaterally, CTAB RRR,No Gallops,Rubs or new Murmurs, No Parasternal Heave Increased B.Sounds, Abd Soft, No tenderness, No rebound - guarding or rigidity. No Cyanosis, Clubbing or edema, No new Rash or bruise       Data Reviewed: I have personally reviewed following labs and imaging studies  CBC: Recent Labs  Lab 05/03/22 2025 05/04/22 0449 05/04/22 0722  WBC 7.5  --  7.8  NEUTROABS 2.9  --  2.9  HGB 11.2* 11.6* 10.2*  HCT 34.5* 34.0* 31.1*  MCV 87.8  --  88.6  PLT 276  --  833    Basic Metabolic Panel: Recent Labs  Lab 05/03/22 2025 05/04/22 0449 05/04/22 0722 05/05/22 0257  NA 136 136 136 138  K 3.9 3.6 3.5 3.8  CL 104  --   104 109  CO2 20*  --  20* 20*  GLUCOSE 115*  --  127* 99  BUN 33*  --  39* 34*  CREATININE 1.89*  --  2.51* 2.02*  CALCIUM 9.2  --  8.5* 8.8*  MG  --   --  2.0 2.3    GFR: CrCl cannot be calculated (Unknown ideal weight.).  Liver Function Tests: Recent Labs  Lab 05/03/22 2025 05/04/22 0722 05/05/22 0257  AST 49* 39 50*  ALT 42 36 40  ALKPHOS 37* 34* 36*  BILITOT 0.6 0.6 0.7  PROT 7.7 6.7 6.7  ALBUMIN 3.8 3.2* 3.1*    CBG: Recent Labs  Lab 05/04/22 1211 05/04/22 1849 05/04/22 2120 05/05/22 0738 05/05/22 1119  GLUCAP 139* 105* 143* 113* 185*     Recent Results (from the past 240 hour(s))  SARS Coronavirus 2 by RT PCR (hospital order, performed in [facility] hospital lab) *cepheid single result test* Anterior Nasal Swab     Status: None   Collection Time: 05/04/22  5:10 AM   Specimen: Anterior Nasal Swab  Result Value Ref Range Status   SARS Coronavirus 2 by RT  PCR NEGATIVE NEGATIVE Final    Comment: (NOTE) SARS-CoV-2 target nucleic acids are NOT DETECTED.  The SARS-CoV-2 RNA is generally detectable in upper and lower respiratory specimens during the acute phase of infection. The lowest concentration of SARS-CoV-2 viral copies this assay can detect is 250 copies / mL. A negative result does not preclude SARS-CoV-2 infection and should not be used as the sole basis for treatment or other patient management decisions.  A negative result may occur with improper specimen collection / handling, submission of specimen other than nasopharyngeal swab, presence of viral mutation(s) within the areas targeted by this assay, and inadequate number of viral copies (<250 copies / mL). A negative result must be combined with clinical observations, patient history, and epidemiological information.  Fact Sheet for Patients:   https://www.patel.info/  Fact Sheet for Healthcare Providers: https://hall.com/  This test is not yet  approved or  cleared by the [place] FDA and has been authorized for detection and/or diagnosis of SARS-CoV-2 by FDA under an Emergency Use Authorization (EUA).  This EUA will remain in effect (meaning this test can be used) for the duration of the COVID-19 declaration under Section 564(b)(1) of the Act, 21 U.S.C. section 360bbb-3(b)(1), unless the authorization is terminated or revoked sooner.  Performed at [facility], [place] [address] [place], [place] [address]   Gastrointestinal Panel by PCR , Stool     Status: Abnormal   Collection Time: 05/04/22  6:50 AM   Specimen: Stool  Result Value Ref Range Status   Campylobacter species DETECTED (A) NOT DETECTED Final    Comment: RESULT CALLED TO, READ BACK BY AND VERIFIED WITH: Janeloe Lamberang '@0023'$  on 05/05/22 SKL    Plesimonas shigelloides NOT DETECTED NOT DETECTED Final   Salmonella species NOT DETECTED NOT DETECTED Final   Yersinia enterocolitica NOT DETECTED NOT DETECTED Final   Vibrio species NOT DETECTED NOT DETECTED Final   Vibrio cholerae NOT DETECTED NOT DETECTED Final   Enteroaggregative E coli (EAEC) NOT DETECTED NOT DETECTED Final   Enteropathogenic E coli (EPEC) NOT DETECTED NOT DETECTED Final   Enterotoxigenic E coli (ETEC) NOT DETECTED NOT DETECTED Final   Shiga like toxin producing E coli (STEC) NOT DETECTED NOT DETECTED Final   Shigella/Enteroinvasive E coli (EIEC) NOT DETECTED NOT DETECTED Final   Cryptosporidium NOT DETECTED NOT DETECTED Final   Cyclospora cayetanensis NOT DETECTED NOT DETECTED Final   Entamoeba histolytica NOT DETECTED NOT DETECTED Final   Giardia lamblia NOT DETECTED NOT DETECTED Final   Adenovirus F40/41 NOT DETECTED NOT DETECTED Final   Astrovirus NOT DETECTED NOT DETECTED Final   Norovirus GI/GII DETECTED (A) NOT DETECTED Final    Comment: RESULT CALLED TO, READ BACK BY AND VERIFIED WITH: Janeloe Lamberang '@0023'$  on 05/05/22 SKL    Rotavirus A NOT DETECTED NOT DETECTED Final    Sapovirus (I, II, IV, and V) NOT DETECTED NOT DETECTED Final    Comment: Performed at [facility], [address] [place], [place] [address]  Culture, blood (Routine X 2) w Reflex to ID Panel     Status: None (Preliminary result)   Collection Time: 05/04/22  6:54 AM   Specimen: BLOOD  Result Value Ref Range Status   Specimen Description BLOOD SITE NOT SPECIFIED  Final   Special Requests   Final    BOTTLES DRAWN AEROBIC AND ANAEROBIC Blood Culture adequate volume   Culture   Final    NO GROWTH 1 DAY Performed at [facility], [address] [place], [place] [address]    Report Status PENDING  Incomplete  C Difficile Quick Screen w PCR reflex     Status: Abnormal   Collection Time: 05/04/22  6:57 AM   Specimen: Stool  Result Value Ref Range Status   C Diff antigen POSITIVE (A) NEGATIVE Final   C Diff toxin NEGATIVE NEGATIVE Final   C Diff interpretation Results are indeterminate. See PCR results.  Final    Comment: Performed at [facility], [place] [address] [place], [place] [address]  C. Diff by PCR, Reflexed     Status: Abnormal   Collection Time: 05/04/22  6:57 AM  Result Value Ref Range Status   Toxigenic C. Difficile by PCR POSITIVE (A) NEGATIVE Final    Comment: Positive for toxigenic C. difficile with little to no toxin production. Only treat if clinical presentation suggests symptomatic illness. Performed at [facility], [place] [address], [place], [place] [address]   Culture, blood (Routine X 2) w Reflex to ID Panel     Status: None (Preliminary result)   Collection Time: 05/04/22  7:26 AM   Specimen: BLOOD  Result Value Ref Range Status   Specimen Description BLOOD SITE NOT SPECIFIED  Final   Special Requests   Final    BOTTLES DRAWN AEROBIC AND ANAEROBIC Blood Culture adequate volume   Culture   Final    NO GROWTH 1 DAY Performed at [facility], [address], [place],  [address]    Report Status PENDING  Incomplete          Radiology Studies: CT ABDOMEN PELVIS WO CONTRAST  Result Date: 05/04/2022 CLINICAL DATA:  Left lower quadrant pain EXAM: CT ABDOMEN AND PELVIS WITHOUT CONTRAST TECHNIQUE: Multidetector CT imaging of the abdomen and pelvis was performed following the standard protocol without IV contrast. RADIATION DOSE REDUCTION: This exam was performed according to the departmental dose-optimization program which includes automated exposure control, adjustment of the mA and/or kV according to patient size and/or use of iterative reconstruction technique. COMPARISON:  05/28/2017 FINDINGS: Lower chest: No acute abnormality Hepatobiliary: No focal hepatic abnormality. Gallbladder unremarkable. Pancreas: No focal abnormality or ductal dilatation. Spleen: No focal abnormality.  Normal size. Adrenals/Urinary Tract: Punctate nonobstructing stone in the lower pole of the right kidney. No ureteral stones or hydronephrosis bilaterally. No renal or adrenal mass. Urinary bladder unremarkable. Stomach/Bowel: Diffuse colonic diverticulosis. No active diverticulitis. Stomach and small bowel decompressed, unremarkable. Appendix is normal. Vascular/Lymphatic: Prior stent graft repair of abdominal aortic aneurysm. Aneurysm sac is stable. No adenopathy. Reproductive: Prior hysterectomy.  No adnexal masses. Other: No free fluid or free air. Musculoskeletal: No acute bony abnormality IMPRESSION: Colonic diverticulosis. No active diverticulitis. Prior stent graft repair of AAA. Stable aneurysm sac size. No acute findings. Electronically Signed   By: Rolm Baptise M.D.   On: 05/04/2022 03:17   CT CHEST WO CONTRAST  Result Date: 05/04/2022 CLINICAL DATA:  Unexplained hypoxia EXAM: CT CHEST WITHOUT CONTRAST TECHNIQUE: Multidetector CT imaging of the chest was performed following the standard protocol without IV contrast. RADIATION DOSE REDUCTION: This exam was performed according to the departmental dose-optimization program which includes  automated exposure control, adjustment of the mA and/or kV according to patient size and/or use of iterative reconstruction technique. COMPARISON:  Chest radiograph 05/04/2022 Chest CT 02/23/2012 FINDINGS: Cardiovascular: Atherosclerotic calcifications seen throughout the thoracic aorta without aneurysmal dilatation. Heart size is within normal limits. Extensive coronary artery calcifications are present. Mediastinum/Nodes: Surgical clips noted  in the right axilla. No enlarged axillary, mediastinal, or hilar lymph nodes. Lungs/Pleura: Mild emphysematous changes seen throughout both lungs. Minimal atelectasis present at the left lung base. No focal airspace opacity to indicate pneumonia. 4 mm right upper lobe pulmonary nodule is only minimally increased in size since 02/22/2022 which indicates a benign etiology. This is not require further imaging follow-up. Upper Abdomen: No acute abnormality. Musculoskeletal: No chest wall mass or suspicious bone lesions identified. IMPRESSION: 1. Severe coronary artery calcifications.  Heart size is normal. 2. Atherosclerotic calcifications seen throughout the thoracic aorta without aneurysmal dilatation. 3. Mild emphysematous changes the lungs. No focal airspace opacity to indicate pneumonia. Aortic Atherosclerosis (ICD10-I70.0) and Emphysema (ICD10-J43.9). Electronically Signed   By: Miachel Roux M.D.   On: 05/04/2022 08:08   US RENAL  Result Date: 05/05/2022 CLINICAL DATA:  Acute kidney injury. EXAM: RENAL / URINARY TRACT ULTRASOUND COMPLETE COMPARISON:  CT of the abdomen pelvis 05/04/2022 FINDINGS: Right Kidney: Renal measurements: 10.4 x 4.4 x 3.3 cm = volume: 82 mL. Echogenicity within normal limits. No mass or hydronephrosis visualized. Left Kidney: Renal measurements: 13.1 x 6.3 x 6.3 cm = volume: 171 mL. Echogenicity within normal limits. No mass or hydronephrosis visualized. Bladder: Appears normal for degree of bladder distention. Right ureteral jet not seen. Other:  None. IMPRESSION: 1. Unremarkable renal ultrasound. Electronically Signed   By: San Morelle M.D.   On: 05/05/2022 09:12   DG Chest Port 1 View  Result Date: 05/04/2022 CLINICAL DATA:  chest pain EXAM: PORTABLE CHEST 1 VIEW COMPARISON:  12/18/2019 FINDINGS: Heart is borderline in size. No confluent airspace opacities, effusions or edema. No acute bony abnormality. IMPRESSION: No active disease. Electronically Signed   By: Rolm Baptise M.D.   On: 05/04/2022 02:12        Scheduled Meds:  azithromycin  500 mg Oral Daily   gabapentin  100 mg Oral BID   gemfibrozil  600 mg Oral BID AC   insulin aspart  0-15 Units Subcutaneous TID AC & HS   insulin glargine-yfgn  30 Units Subcutaneous QHS   levothyroxine  50 mcg Oral Q0600   niacin  500 mg Oral QHS   omega-3 acid ethyl esters  1 g Oral BID   pantoprazole (PROTONIX) IV  40 mg Intravenous Q12H   sodium chloride flush  3 mL Intravenous Q12H   vancomycin  125 mg Oral QID   Continuous Infusions:  sodium chloride Stopped (05/05/22 0857)   lactated ringers 50 mL/hr at 05/05/22 0856     LOS: 0 days        Phillips Climes, MD [facility]   To contact the attending provider between 7A-7P or the covering provider during after hours 7P-7A, please log into the web site [email or web address] and access using universal  password for that web site. If you do not have the password, please call the hospital operator.  05/05/2022, 2:23 PM

## 2022-05-05 NOTE — TOC Benefit Eligibility Note (Signed)
Patient Teacher, English as a foreign language completed.    The patient is currently admitted and upon discharge could be taking Vancomycin 125 mg capsules.  Prior Authorization Required  The patient is insured through [place], [place] Patient Advocate Specialist [place] Patient Advocate Team Direct Number: [phone]  Fax: [phone]

## 2022-05-05 NOTE — Plan of Care (Signed)
  Problem: Education: Goal: Ability to describe self-care measures that may prevent or decrease complications (Diabetes Survival Skills Education) will improve Outcome: Progressing   Problem: Health Behavior/Discharge Planning: Goal: Ability to identify and utilize available resources and services will improve Outcome: Progressing   

## 2022-05-05 NOTE — TOC Benefit Eligibility Note (Signed)
Patient Advocate Encounter  Prior Authorization for Vancomycin HCl '125MG'$  capsules has been approved.    PA# [id number] Effective dates: 05/05/2022 through 12/03/2022  Patients co-pay is $95.00.     Lyndel Safe, [place] Patient Advocate Specialist [place] Patient Advocate Team Direct Number: [phone]  Fax: [phone]

## 2022-05-06 DIAGNOSIS — A045 Campylobacter enteritis: Secondary | ICD-10-CM | POA: Diagnosis not present

## 2022-05-06 DIAGNOSIS — N179 Acute kidney failure, unspecified: Secondary | ICD-10-CM | POA: Diagnosis not present

## 2022-05-06 DIAGNOSIS — A0472 Enterocolitis due to Clostridium difficile, not specified as recurrent: Secondary | ICD-10-CM

## 2022-05-06 DIAGNOSIS — J9601 Acute respiratory failure with hypoxia: Secondary | ICD-10-CM | POA: Diagnosis not present

## 2022-05-06 LAB — BASIC METABOLIC PANEL
Anion gap: 9 (ref 5–15)
BUN: 24 mg/dL — ABNORMAL HIGH (ref 8–23)
CO2: 20 mmol/L — ABNORMAL LOW (ref 22–32)
Calcium: 8.8 mg/dL — ABNORMAL LOW (ref 8.9–10.3)
Chloride: 108 mmol/L (ref 98–111)
Creatinine, Ser: 1.67 mg/dL — ABNORMAL HIGH (ref 0.44–1.00)
GFR, Estimated: 31 mL/min — ABNORMAL LOW (ref >60)
Glucose, Bld: 140 mg/dL — ABNORMAL HIGH (ref 70–99)
Potassium: 3.6 mmol/L (ref 3.5–5.1)
Sodium: 137 mmol/L (ref 135–145)

## 2022-05-06 LAB — CBC
HCT: 30.4 % — ABNORMAL LOW (ref 36.0–46.0)
Hemoglobin: 10.3 g/dL — ABNORMAL LOW (ref 12.0–15.0)
MCH: 29.4 pg (ref 26.0–34.0)
MCHC: 33.9 g/dL (ref 30.0–36.0)
MCV: 86.9 fL (ref 80.0–100.0)
Platelets: 288 10*3/uL (ref 150–400)
RBC: 3.5 MIL/uL — ABNORMAL LOW (ref 3.87–5.11)
RDW: 15.9 % — ABNORMAL HIGH (ref 11.5–15.5)
WBC: 9.7 10*3/uL (ref 4.0–10.5)
nRBC: 0 % (ref 0.0–0.2)

## 2022-05-06 LAB — GLUCOSE, CAPILLARY
Glucose-Capillary: 114 mg/dL — ABNORMAL HIGH (ref 70–99)
Glucose-Capillary: 142 mg/dL — ABNORMAL HIGH (ref 70–99)
Glucose-Capillary: 116 mg/dL — ABNORMAL HIGH (ref 70–99)
Glucose-Capillary: 129 mg/dL — ABNORMAL HIGH (ref 70–99)

## 2022-05-06 MED ORDER — HEPARIN SODIUM (PORCINE) 5000 UNIT/ML IJ SOLN
5000.0000 [IU] | Freq: Three times a day (TID) | INTRAMUSCULAR | Status: DC
  Administered 2022-05-06 – 2022-05-07 (×3): 5000 [IU] via SUBCUTANEOUS
  Filled 2022-05-06 (×3): qty 1

## 2022-05-06 MED ORDER — ORAL CARE MOUTH RINSE
15.0000 mL | Freq: Two times a day (BID) | OROMUCOSAL | Status: DC
  Administered 2022-05-06 – 2022-05-07 (×3): 15 mL via OROMUCOSAL

## 2022-05-06 NOTE — Progress Notes (Signed)
PROGRESS NOTE    Mandy Kim  UKG:[id number] DOB: 17-May-1946 DOA: 05/03/2022 PCP: Darreld Mclean, MD    Chief Complaint  Patient presents with   Diarrhea   GI Bleeding    Brief Narrative:     76 year old female with past medical history of insulin-dependent diabetes mellitus type 2, hyperlipidemia, hypertension, gastroesophageal reflux disease, remote history of bilateral breast cancer who presents to [facility] emergency department with diarrhea and abdominal discomfort.   Patient explains that approximately 6 days ago she began to experience abdominal discomfort.  This abdominal discomfort has been sharp to crampy in quality, moderate in intensity and generalized.  Abdominal discomfort has been associated with bouts of watery diarrhea occurring several times daily.  Patient denies any associated nausea or vomiting.  Patient denies any consumption of undercooked foods, recent travel or sick contacts with similar symptoms.  Of note, patient is on chronic suppressive antibiotic therapy with Keflex per her urologist   Patient symptoms continue to persist over the next several days.  Patient also reports that over the span of time her stools were transiently dark to black in color.  These seem to have resolved after several days and her watery stools are now brown in color once again.  Patient eventually presented to [facility] emergency department for evaluation due to persisting symptoms.   Upon evaluation in the emergency department on arrival patient was found to be hypoxic with oxygen saturations in the 80s.  Patient had to be placed on 3 L of oxygen via nasal cannula.  Oxygen requirement was confirmed with obtaining a room air ABG revealing a PaO2 of 59.  Patient was also found to be developing acute kidney injury with creatinine currently 1.89, up from 0.782 months ago.  Patient was initiated on intravenous fluids with sodium chloride and the hospitalist group was  then called to assess the patient for admission to the hospital.    Assessment & Plan:   Principal Problem:   Acute respiratory failure with hypoxia ([place]) Active Problems:   Acute diarrhea   AKI (acute kidney injury) ([place])   Dark stools   Type 2 diabetes mellitus with diabetic polyneuropathy, with long-term current use of insulin (HCC)   Essential hypertension   Hypothyroidism   GERD without esophagitis   Mixed hyperlipidemia due to type 2 diabetes mellitus ([place])   Hypoxia   Acute respiratory failure with hypoxia ([place]) - Patient incidentally found to be hypoxic upon arrival with Dr. Marta Lamas in the mid 80s - Room air ABG does indeed reveal PaO2 of 59 on room air consistent with mild acute hypoxic respiratory failure - Chest x-ray reveals no evidence of obvious acute cardiopulmonary process - D-dimer unremarkable -CT chest with evidence of mild emphysema, does not appear to be stable exacerbation, may end up going home with oxygen.  Campylobacter colitis -She presents with significant diarrhea for last 4 to 5 days, work-up significant for Campylobacter, will start on azithromycin 500 mg oral x3 days. -Symptoms well tested positive for norovirus  C. difficile diarrhea -She is on chronic Keflex suppression therapy -Her C. difficile toxin PCR is positive, started on vancomycin. -Avoid PPI. -Will discontinue Keflex  AKI (acute kidney injury) ([place]) -Creatinine peaked at 2.5, improving at 1.67 today.   -Creatinine is improving -Her appetite remains poor, continue with IV fluids -No acute finding in her renal ultrasound.  Type 2 diabetes mellitus with diabetic polyneuropathy, with long-term current use of insulin ([place]) Patient been placed on Accu-Cheks  before every meal and nightly with sliding scale insulin Holding home regimen of oral hypoglycemics Placing patient on reduced regimen of basal insulin therapy considering tenuous oral intake Hemoglobin A1C 7.9% 02/2022 Advance  to diabetic diet once able     Essential hypertension Holding home regimen of Cozaar due to acute kidney injury PRN intravenous antihypertensives for excessively elevated blood pressure   Hypothyroidism Resume home regimen of Synthroid   GERD without esophagitis C hold on Protonix given C. difficile diarrhea,OMG   Mixed hyperlipidemia due to type 2 diabetes mellitus ([place]) Continuing home regimen of lipid lowering therapy.             DVT prophylaxis: SCD's, [place] heparin Code Status: Full Family Communication: Discussed with husband at bedside Disposition:   Status is: inpatient   Consultants:  none  Subjective: He had 4-5 large watery bowel movement over last 24 hours, this morning her bowel started to form, she remains with poor appetite Objective: Vitals:   05/05/22 1713 05/05/22 2153 05/05/22 2216 05/06/22 0536  BP: (!) 121/41 (!) 128/43 (!) 123/59 (!) 110/36  Pulse: 72 66 75 61  Resp: '16 18 16 18  '$ Temp: 99.5 F (37.5 C) 99.8 F (37.7 C) 99.8 F (37.7 C) 98.1 F (36.7 C)  TempSrc: Oral Oral Oral Oral  SpO2: 91% 94% 96% 95%    Intake/Output Summary (Last 24 hours) at 05/06/2022 1400 Last data filed at 05/06/2022 1153 Gross per 24 hour  Intake 1926.56 ml  Output 2 ml  Net 1924.56 ml   There were no vitals filed for this visit.  Examination:   Awake Alert, Oriented X 3, frail Symmetrical Chest wall movement, Good air movement bilaterally, CTAB RRR,No Gallops,Rubs or new Murmurs, No Parasternal Heave +ve B.Sounds, Abd Soft, No tenderness, No rebound - guarding or rigidity. No Cyanosis, Clubbing or edema, No new Rash or bruise        Data Reviewed: I have personally reviewed following labs and imaging studies  CBC: Recent Labs  Lab 05/03/22 2025 05/04/22 0449 05/04/22 0722 05/06/22 0234  WBC 7.5  --  7.8 9.7  NEUTROABS 2.9  --  2.9  --   HGB 11.2* 11.6* 10.2* 10.3*  HCT 34.5* 34.0* 31.1* 30.4*  MCV 87.8  --  88.6 86.9  PLT 276  --  251 288     Basic Metabolic Panel: Recent Labs  Lab 05/03/22 2025 05/04/22 0449 05/04/22 0722 05/05/22 0257 05/06/22 0234  NA 136 136 136 138 137  K 3.9 3.6 3.5 3.8 3.6  CL 104  --  104 109 108  CO2 20*  --  20* 20* 20*  GLUCOSE 115*  --  127* 99 140*  BUN 33*  --  39* 34* 24*  CREATININE 1.89*  --  2.51* 2.02* 1.67*  CALCIUM 9.2  --  8.5* 8.8* 8.8*  MG  --   --  2.0 2.3  --     GFR: CrCl cannot be calculated (Unknown ideal weight.).  Liver Function Tests: Recent Labs  Lab 05/03/22 2025 05/04/22 0722 05/05/22 0257  AST 49* 39 50*  ALT 42 36 40  ALKPHOS 37* 34* 36*  BILITOT 0.6 0.6 0.7  PROT 7.7 6.7 6.7  ALBUMIN 3.8 3.2* 3.1*    CBG: Recent Labs  Lab 05/05/22 1119 05/05/22 1637 05/05/22 2145 05/06/22 0727 05/06/22 1135  GLUCAP 185* 201* 116* 114* 142*     Recent Results (from the past 240 hour(s))  SARS Coronavirus 2 by RT PCR (hospital  order, performed in [facility] hospital lab) *cepheid single result test* Anterior Nasal Swab     Status: None   Collection Time: 05/04/22  5:10 AM   Specimen: Anterior Nasal Swab  Result Value Ref Range Status   SARS Coronavirus 2 by RT PCR NEGATIVE NEGATIVE Final    Comment: (NOTE) SARS-CoV-2 target nucleic acids are NOT DETECTED.  The SARS-CoV-2 RNA is generally detectable in upper and lower respiratory specimens during the acute phase of infection. The lowest concentration of SARS-CoV-2 viral copies this assay can detect is 250 copies / mL. A negative result does not preclude SARS-CoV-2 infection and should not be used as the sole basis for treatment or other patient management decisions.  A negative result may occur with improper specimen collection / handling, submission of specimen other than nasopharyngeal swab, presence of viral mutation(s) within the areas targeted by this assay, and inadequate number of viral copies (<250 copies / mL). A negative result must be combined with clinical observations, patient  history, and epidemiological information.  Fact Sheet for Patients:   https://www.patel.info/  Fact Sheet for Healthcare Providers: https://hall.com/  This test is not yet approved or  cleared by the [place] FDA and has been authorized for detection and/or diagnosis of SARS-CoV-2 by FDA under an Emergency Use Authorization (EUA).  This EUA will remain in effect (meaning this test can be used) for the duration of the COVID-19 declaration under Section 564(b)(1) of the Act, 21 U.S.C. section 360bbb-3(b)(1), unless the authorization is terminated or revoked sooner.  Performed at [facility], [place] [address], [place], [place] [address]   Gastrointestinal Panel by PCR , Stool     Status: Abnormal   Collection Time: 05/04/22  6:50 AM   Specimen: Stool  Result Value Ref Range Status   Campylobacter species DETECTED (A) NOT DETECTED Final    Comment: RESULT CALLED TO, READ BACK BY AND VERIFIED WITH: Janeloe Lamberang '@0023'$  on 05/05/22 SKL    Plesimonas shigelloides NOT DETECTED NOT DETECTED Final   Salmonella species NOT DETECTED NOT DETECTED Final   Yersinia enterocolitica NOT DETECTED NOT DETECTED Final   Vibrio species NOT DETECTED NOT DETECTED Final   Vibrio cholerae NOT DETECTED NOT DETECTED Final   Enteroaggregative E coli (EAEC) NOT DETECTED NOT DETECTED Final   Enteropathogenic E coli (EPEC) NOT DETECTED NOT DETECTED Final   Enterotoxigenic E coli (ETEC) NOT DETECTED NOT DETECTED Final   Shiga like toxin producing E coli (STEC) NOT DETECTED NOT DETECTED Final   Shigella/Enteroinvasive E coli (EIEC) NOT DETECTED NOT DETECTED Final   Cryptosporidium NOT DETECTED NOT DETECTED Final   Cyclospora cayetanensis NOT DETECTED NOT DETECTED Final   Entamoeba histolytica NOT DETECTED NOT DETECTED Final   Giardia lamblia NOT DETECTED NOT DETECTED Final   Adenovirus F40/41 NOT DETECTED NOT DETECTED Final   Astrovirus NOT DETECTED NOT  DETECTED Final   Norovirus GI/GII DETECTED (A) NOT DETECTED Final    Comment: RESULT CALLED TO, READ BACK BY AND VERIFIED WITH: Janeloe Lamberang '@0023'$  on 05/05/22 SKL    Rotavirus A NOT DETECTED NOT DETECTED Final   Sapovirus (I, II, IV, and V) NOT DETECTED NOT DETECTED Final    Comment: Performed at [facility], [address] [place], [place] [address]  Culture, blood (Routine X 2) w Reflex to ID Panel     Status: None (Preliminary result)   Collection Time: 05/04/22  6:54 AM   Specimen: BLOOD  Result Value Ref Range Status  Specimen Description BLOOD SITE NOT SPECIFIED  Final   Special Requests   Final    BOTTLES DRAWN AEROBIC AND ANAEROBIC Blood Culture adequate volume   Culture   Final    NO GROWTH 2 DAYS Performed at [facility], [address], [place], [place] [address]    Report Status PENDING  Incomplete  C Difficile Quick Screen w PCR reflex     Status: Abnormal   Collection Time: 05/04/22  6:57 AM   Specimen: Stool  Result Value Ref Range Status   C Diff antigen POSITIVE (A) NEGATIVE Final   C Diff toxin NEGATIVE NEGATIVE Final   C Diff interpretation Results are indeterminate. See PCR results.  Final    Comment: Performed at [facility], [place] [address], [place], [place] [address]  C. Diff by PCR, Reflexed     Status: Abnormal   Collection Time: 05/04/22  6:57 AM  Result Value Ref Range Status   Toxigenic C. Difficile by PCR POSITIVE (A) NEGATIVE Final    Comment: Positive for toxigenic C. difficile with little to no toxin production. Only treat if clinical presentation suggests symptomatic illness. Performed at [facility], [place] [address] [place], [place] [address]   Culture, blood (Routine X 2) w Reflex to ID Panel     Status: None (Preliminary result)   Collection Time: 05/04/22  7:26 AM   Specimen: BLOOD  Result Value Ref Range Status   Specimen Description BLOOD SITE NOT SPECIFIED  Final   Special Requests   Final    BOTTLES  DRAWN AEROBIC AND ANAEROBIC Blood Culture adequate volume   Culture   Final    NO GROWTH 2 DAYS Performed at [facility], [address], [place], [place] [address]    Report Status PENDING  Incomplete         Radiology Studies: US RENAL  Result Date: 05/05/2022 CLINICAL DATA:  Acute kidney injury. EXAM: RENAL / URINARY TRACT ULTRASOUND COMPLETE COMPARISON:  CT of the abdomen pelvis 05/04/2022 FINDINGS: Right Kidney: Renal measurements: 10.4 x 4.4 x 3.3 cm = volume: 82 mL. Echogenicity within normal limits. No mass or hydronephrosis visualized. Left Kidney: Renal measurements: 13.1 x 6.3 x 6.3 cm = volume: 171 mL. Echogenicity within normal limits. No mass or hydronephrosis visualized. Bladder: Appears normal for degree of bladder distention. Right ureteral jet not seen. Other: None. IMPRESSION: 1. Unremarkable renal ultrasound. Electronically Signed   By: San Morelle M.D.   On: 05/05/2022 09:12        Scheduled Meds:  azithromycin  500 mg Oral Daily   famotidine  20 mg Oral Daily   feeding supplement (GLUCERNA SHAKE)  237 mL Oral Q24H   gabapentin  100 mg Oral BID   gemfibrozil  600 mg Oral BID AC   insulin aspart  0-15 Units Subcutaneous TID AC & HS   insulin glargine-yfgn  30 Units Subcutaneous QHS   levothyroxine  50 mcg Oral Q0600   mouth rinse  15 mL Mouth Rinse BID   multivitamin with minerals  1 tablet Oral Daily   niacin  500 mg Oral QHS   omega-3 acid ethyl esters  1 g Oral BID   sodium chloride flush  3 mL Intravenous Q12H   vancomycin  125 mg Oral QID   Continuous Infusions:  lactated ringers 50 mL/hr at 05/06/22 1153     LOS: 1 day        Phillips Climes, MD  [facility]   To contact the attending provider between 7A-7P or the covering provider during after hours 7P-7A, please log into the web site [email or web address] and access using universal [place] password for that web site. If you do not have the password, please call the  hospital operator.  05/06/2022, 2:00 PM

## 2022-05-06 NOTE — Plan of Care (Signed)
  Problem: Metabolic: Goal: Ability to maintain appropriate glucose levels will improve Outcome: Progressing   Problem: Nutritional: Goal: Maintenance of adequate nutrition will improve Outcome: Progressing   Problem: Clinical Measurements: Goal: Respiratory complications will improve Outcome: Progressing   Problem: Clinical Measurements: Goal: Cardiovascular complication will be avoided Outcome: Progressing

## 2022-05-07 DIAGNOSIS — R197 Diarrhea, unspecified: Secondary | ICD-10-CM | POA: Diagnosis not present

## 2022-05-07 DIAGNOSIS — J9601 Acute respiratory failure with hypoxia: Secondary | ICD-10-CM | POA: Diagnosis not present

## 2022-05-07 LAB — GLUCOSE, CAPILLARY: Glucose-Capillary: 116 mg/dL — ABNORMAL HIGH (ref 70–99)

## 2022-05-07 MED ORDER — VANCOMYCIN HCL 125 MG PO CAPS
125.0000 mg | ORAL_CAPSULE | Freq: Four times a day (QID) | ORAL | 0 refills | Status: AC
Start: 2022-05-07 — End: 2022-05-14

## 2022-05-07 MED ORDER — FAMOTIDINE 20 MG PO TABS: 20.0000 mg | ORAL_TABLET | Freq: Every day | ORAL | 1 refills | Status: DC

## 2022-05-07 MED ORDER — LOSARTAN POTASSIUM 50 MG PO TABS: 50.0000 mg | ORAL_TABLET | Freq: Every day | ORAL | 0 refills | Status: DC

## 2022-05-07 NOTE — Discharge Instructions (Signed)
Follow with Primary MD Copland, Gay Filler, MD in 7 days   Get CBC, CMP,  checked  by Primary MD next visit.    Activity: As tolerated with Full fall precautions use walker/cane & assistance as needed   Disposition Home   Diet: Heart Healthy.   On your next visit with your primary care physician please Get Medicines reviewed and adjusted.   Please request your Prim.MD to go over all Hospital Tests and Procedure/Radiological results at the follow up, please get all Hospital records sent to your Prim MD by signing hospital release before you go home.   If you experience worsening of your admission symptoms, develop shortness of breath, life threatening emergency, suicidal or homicidal thoughts you must seek medical attention immediately by calling 911 or calling your MD immediately  if symptoms less severe.  You Must read complete instructions/literature along with all the possible adverse reactions/side effects for all the Medicines you take and that have been prescribed to you. Take any new Medicines after you have completely understood and accpet all the possible adverse reactions/side effects.   Do not drive, operating heavy machinery, perform activities at heights, swimming or participation in water activities or provide baby sitting services if your were admitted for syncope or siezures until you have seen by Primary MD or a Neurologist and advised to do so again.  Do not drive when taking Pain medications.    Do not take more than prescribed Pain, Sleep and Anxiety Medications  Special Instructions: If you have smoked or chewed Tobacco  in the last 2 yrs please stop smoking, stop any regular Alcohol  and or any Recreational drug use.  Wear Seat belts while driving.   Please note  You were cared for by a hospitalist during your hospital stay. If you have any questions about your discharge medications or the care you received while you were in the hospital after you are  discharged, you can call the unit and asked to speak with the hospitalist on call if the hospitalist that took care of you is not available. Once you are discharged, your primary care physician will handle any further medical issues. Please note that NO REFILLS for any discharge medications will be authorized once you are discharged, as it is imperative that you return to your primary care physician (or establish a relationship with a primary care physician if you do not have one) for your aftercare needs so that they can reassess your need for medications and monitor your lab values.

## 2022-05-07 NOTE — Progress Notes (Signed)
DISCHARGE NOTE HOME Evanna S Padron to be discharged Home per MD order. Discussed prescriptions and follow up appointments with the patient. Prescriptions given to patient; medication list explained in detail. Patient verbalized understanding.  Skin clean, dry and intact without evidence of skin break down, no evidence of skin tears noted. IV catheter discontinued intact. Site without signs and symptoms of complications. Dressing and pressure applied. Pt denies pain at the site currently. No complaints noted.  Patient free of lines, drains, and wounds.   An After Visit Summary (AVS) was printed and given to the patient. Patient escorted via wheelchair, and discharged home via private auto.  Berneta Levins, RN

## 2022-05-07 NOTE — Progress Notes (Signed)
Patient refused bed alarm.    

## 2022-05-07 NOTE — Discharge Summary (Signed)
Physician Discharge Summary  MELITTA TIGUE ZOX:[id number] DOB: 08-28-1946 DOA: 05/03/2022  PCP: Darreld Mclean, MD  Admit date: 05/03/2022 Discharge date: 05/07/2022  Admitted From: Home Disposition:  Home  Recommendations for Outpatient Follow-up:  Follow up with PCP in 1-2 weeks Please obtain BMP/CBC in one week Please follow up on the following pending results:  Home Health:*** (YES/NO) Equipment/Devices:*** (oxygen ?L, foley, PICC line--date placed, wound care, Pleurx)  Discharge Condition:*** (Stable, guarded, hospice) CODE STATUS:*** (FULL, DNR, Comfort Care) Diet recommendation: Heart Healthy / Carb Modified / Regular / Dysphagia   Brief/Interim Summary: You may copy/paste interim summary or write brief hospital course depending on length of stay  Discharge Diagnoses:  Principal Problem:   Acute respiratory failure with hypoxia ([place]) Active Problems:   Acute diarrhea   AKI (acute kidney injury) ([place])   Dark stools   Type 2 diabetes mellitus with diabetic polyneuropathy, with long-term current use of insulin (H[facility])   Essential hypertension   Hypothyroidism   GERD without esophagitis   Mixed hyperlipidemia due to type 2 diabetes mellitus ([place])   Hypoxia    Discharge Instructions  Discharge Instructions     Diet - low sodium heart healthy   Complete by: As directed    Discharge instructions   Complete by: As directed    Follow with Primary MD Copland, Gay Filler, MD in 7 days   Get CBC, CMP,  checked  by Primary MD next visit.    Activity: As tolerated with Full fall precautions use walker/cane & assistance as needed   Disposition Home   Diet: Heart Healthy.   On your next visit with your primary care physician please Get Medicines reviewed and adjusted.   Please request your Prim.MD to go over all Hospital Tests and Procedure/Radiological results at the follow up, please get all Hospital records sent to your Prim MD by signing hospital release  before you go home.   If you experience worsening of your admission symptoms, develop shortness of breath, life threatening emergency, suicidal or homicidal thoughts you must seek medical attention immediately by calling 911 or calling your MD immediately  if symptoms less severe.  You Must read complete instructions/literature along with all the possible adverse reactions/side effects for all the Medicines you take and that have been prescribed to you. Take any new Medicines after you have completely understood and accpet all the possible adverse reactions/side effects.   Do not drive, operating heavy machinery, perform activities at heights, swimming or participation in water activities or provide baby sitting services if your were admitted for syncope or siezures until you have seen by Primary MD or a Neurologist and advised to do so again.  Do not drive when taking Pain medications.    Do not take more than prescribed Pain, Sleep and Anxiety Medications  Special Instructions: If you have smoked or chewed Tobacco  in the last 2 yrs please stop smoking, stop any regular Alcohol  and or any Recreational drug use.  Wear Seat belts while driving.   Please note  You were cared for by a hospitalist during your hospital stay. If you have any questions about your discharge medications or the care you received while you were in the hospital after you are discharged, you can call the unit and asked to speak with the hospitalist on call if the hospitalist that took care of you is not available. Once you are discharged, your primary care physician will handle any further medical issues. Please note  that NO REFILLS for any discharge medications will be authorized once you are discharged, as it is imperative that you return to your primary care physician (or establish a relationship with a primary care physician if you do not have one) for your aftercare needs so that they can reassess your need for  medications and monitor your lab values.   Increase activity slowly   Complete by: As directed       Allergies as of 05/07/2022       Reactions   Aspirin    Per pt any pain medications - causes fluid build up    Bee Venom Swelling   Ibuprofen Swelling   Ozempic (0.25 Or 0.5 Mg-dose) [semaglutide(0.25 Or 0.39m-dos)] Diarrhea, Nausea And Vomiting   Flatulence   Poison Oak Extract Itching, Swelling   Statins    "skin feels like bugs under the skin"   Trulicity [dulaglutide] Nausea And Vomiting, Other (See Comments)   Flatulance, "deathly sick"   Tylenol [acetaminophen] Swelling   Victoza [liraglutide] Other (See Comments)   Stomach upset issues         Medication List     STOP taking these medications    cephALEXin 250 MG capsule Commonly known as: Keflex   pantoprazole 40 MG tablet Commonly known as: PROTONIX       TAKE these medications    Accu-Chek Aviva Plus test strip Generic drug: glucose blood TEST BLOOD SUGAR THREE TIMES DAILY   Accu-Chek Aviva Plus w/Device Kit USE AS DIRECTED  TO TEST BLOOD GLUCOSE BID E11.9   Accu-Chek Softclix Lancets lancets TEST BLOOD SUGAR THREE TIMES DAILY   Alcohol Swabs Pads Use to test blood sugar 3 times daily. Dx: E11.9   DropSafe Alcohol Prep 70 % Pads USE TO TEST BLOOD SUGAR 3 TIMES DAILY.   b complex vitamins tablet Take 0.5 tablets by mouth 2 (two) times daily.   BIOFREEZE ROLL-ON EX Apply 1 application topically as needed (joint pain).   Biotin 5 MG Tabs Take 5,000 mcg by mouth at bedtime.   clopidogrel 75 MG tablet Commonly known as: PLAVIX TAKE 1 TABLET EVERY DAY   Cranberry 500 MG Tabs Take 500 mg by mouth daily.   famotidine 20 MG tablet Commonly known as: PEPCID Take 1 tablet (20 mg total) by mouth daily.   Fish Oil 1000 MG Caps Take 1,000 mg by mouth 2 (two) times a day.   gabapentin 100 MG capsule Commonly known as: NEURONTIN TAKE 1 CAPSULE TWICE DAILY   gemfibrozil 600 MG  tablet Commonly known as: LOPID TAKE 1 TABLET TWICE DAILY BEFORE MEALS   glipiZIDE 5 MG tablet Commonly known as: GLUCOTROL TAKE 1 TABLET TWICE DAILY BEFORE A MEAL   Glucose Control Soln Use to test blood sugar 3 times daily. Dx: E11.9   Insulin Pen Needle 32G X 4 MM Misc Used to inject insulin 1x daily.   INSULIN SYRINGE 1CC/30GX5/16" 30G X 5/16" 1 ML Misc Use to inject insulin 1 time per day.   levothyroxine 50 MCG tablet Commonly known as: SYNTHROID Take 1 tablet (50 mcg total) by mouth daily before breakfast.   losartan 50 MG tablet Commonly known as: COZAAR Take 1 tablet (50 mg total) by mouth daily. Start taking on: May 12, 2022 What changed: These instructions start on May 12, 2022. If you are unsure what to do until then, ask your doctor or other care provider.   magnesium oxide 400 MG tablet Commonly known as: MAG-OX Take 200 mg by  mouth daily.   metFORMIN 1000 MG tablet Commonly known as: GLUCOPHAGE TAKE 1 TABLET TWICE DAILY WITH MEALS   multivitamin tablet Take 0.5 tablets by mouth 2 (two) times daily.   niacin 500 MG CR tablet Commonly known as: NIASPAN Take 1 tablet (500 mg total) by mouth at bedtime.   potassium chloride SA 20 MEQ tablet Commonly known as: KLOR-CON M TAKE 1 TABLET EVERY DAY   PROBIOTIC DAILY PO Take 1 capsule by mouth daily.   [name] FlexTouch 100 UNIT/ML FlexTouch Pen Generic drug: insulin degludec INJECT  60 UNITS EVERY DAY AT BEDTIME   vancomycin 125 MG capsule Commonly known as: VANCOCIN Take 1 capsule (125 mg total) by mouth 4 (four) times daily for 7 days.   VITAMIN D PO Take 1 capsule by mouth daily.   zinc gluconate 50 MG tablet Take 50 mg by mouth at bedtime.        Allergies  Allergen Reactions   Aspirin     Per pt any pain medications - causes fluid build up    Bee Venom Swelling   Ibuprofen Swelling   Ozempic (0.25 Or 0.5 Mg-Dose) [Semaglutide(0.25 Or 0.97m-Dos)] Diarrhea and Nausea And Vomiting     Flatulence   Poison Oak Extract Itching and Swelling   Statins     "skin feels like bugs under the skin"   Trulicity [Dulaglutide] Nausea And Vomiting and Other (See Comments)    Flatulance, "deathly sick"   Tylenol [Acetaminophen] Swelling   Victoza [Liraglutide] Other (See Comments)    Stomach upset issues     Consultations: ***Specify Physician/Group   Procedures/Studies: CT ABDOMEN PELVIS WO CONTRAST  Result Date: 05/04/2022 CLINICAL DATA:  Left lower quadrant pain EXAM: CT ABDOMEN AND PELVIS WITHOUT CONTRAST TECHNIQUE: Multidetector CT imaging of the abdomen and pelvis was performed following the standard protocol without IV contrast. RADIATION DOSE REDUCTION: This exam was performed according to the departmental dose-optimization program which includes automated exposure control, adjustment of the mA and/or kV according to patient size and/or use of iterative reconstruction technique. COMPARISON:  05/28/2017 FINDINGS: Lower chest: No acute abnormality Hepatobiliary: No focal hepatic abnormality. Gallbladder unremarkable. Pancreas: No focal abnormality or ductal dilatation. Spleen: No focal abnormality.  Normal size. Adrenals/Urinary Tract: Punctate nonobstructing stone in the lower pole of the right kidney. No ureteral stones or hydronephrosis bilaterally. No renal or adrenal mass. Urinary bladder unremarkable. Stomach/Bowel: Diffuse colonic diverticulosis. No active diverticulitis. Stomach and small bowel decompressed, unremarkable. Appendix is normal. Vascular/Lymphatic: Prior stent graft repair of abdominal aortic aneurysm. Aneurysm sac is stable. No adenopathy. Reproductive: Prior hysterectomy.  No adnexal masses. Other: No free fluid or free air. Musculoskeletal: No acute bony abnormality IMPRESSION: Colonic diverticulosis. No active diverticulitis. Prior stent graft repair of AAA. Stable aneurysm sac size. No acute findings. Electronically Signed   By: KRolm BaptiseM.D.   On: 05/04/2022  03:17   CT CHEST WO CONTRAST  Result Date: 05/04/2022 CLINICAL DATA:  Unexplained hypoxia EXAM: CT CHEST WITHOUT CONTRAST TECHNIQUE: Multidetector CT imaging of the chest was performed following the standard protocol without IV contrast. RADIATION DOSE REDUCTION: This exam was performed according to the departmental dose-optimization program which includes automated exposure control, adjustment of the mA and/or kV according to patient size and/or use of iterative reconstruction technique. COMPARISON:  Chest radiograph 05/04/2022 Chest CT 02/23/2012 FINDINGS: Cardiovascular: Atherosclerotic calcifications seen throughout the thoracic aorta without aneurysmal dilatation. Heart size is within normal limits. Extensive coronary artery calcifications are present. Mediastinum/Nodes: Surgical clips noted in  the right axilla. No enlarged axillary, mediastinal, or hilar lymph nodes. Lungs/Pleura: Mild emphysematous changes seen throughout both lungs. Minimal atelectasis present at the left lung base. No focal airspace opacity to indicate pneumonia. 4 mm right upper lobe pulmonary nodule is only minimally increased in size since 02/22/2022 which indicates a benign etiology. This is not require further imaging follow-up. Upper Abdomen: No acute abnormality. Musculoskeletal: No chest wall mass or suspicious bone lesions identified. IMPRESSION: 1. Severe coronary artery calcifications.  Heart size is normal. 2. Atherosclerotic calcifications seen throughout the thoracic aorta without aneurysmal dilatation. 3. Mild emphysematous changes the lungs. No focal airspace opacity to indicate pneumonia. Aortic Atherosclerosis (ICD10-I70.0) and Emphysema (ICD10-J43.9). Electronically Signed   By: Miachel Roux M.D.   On: 05/04/2022 08:08   US RENAL  Result Date: 05/05/2022 CLINICAL DATA:  Acute kidney injury. EXAM: RENAL / URINARY TRACT ULTRASOUND COMPLETE COMPARISON:  CT of the abdomen pelvis 05/04/2022 FINDINGS: Right Kidney: Renal  measurements: 10.4 x 4.4 x 3.3 cm = volume: 82 mL. Echogenicity within normal limits. No mass or hydronephrosis visualized. Left Kidney: Renal measurements: 13.1 x 6.3 x 6.3 cm = volume: 171 mL. Echogenicity within normal limits. No mass or hydronephrosis visualized. Bladder: Appears normal for degree of bladder distention. Right ureteral jet not seen. Other: None. IMPRESSION: 1. Unremarkable renal ultrasound. Electronically Signed   By: San Morelle M.D.   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 Patient does wish to proceed with diagnostic cerebral angiogram. ACCESS: The technical aspects of the procedure as well as its potential risks and benefits were reviewed with the patient. These risks included but were not limited bleeding, infection, allergic reaction, damage to organs or vital structures, stroke, non-diagnostic procedure, and the catastrophic outcomes of heart attack, coma, and death. With an understanding of these risks, informed consent was obtained and witnessed. The patient was placed in the supine position on the angiography table and the skin of right groin prepped in the usual sterile fashion. The procedure was performed under local anesthesia (1%-solution of bicarbonate-buffered Lidocaine) and conscious sedation with 0.98m versed monitored by myself and the in-suite nurse using continuous pulse-oximetry, heart rate, and non-invasive blood-pressure. A 5- French sheath was introduced in the right radial artery under ultrasound guidance using Seldinger technique in order to directly visualize endoluminal access to the right radial artery with a micro puncture needle. A fluoro-phase sequence  was used to document the sheath position. MEDICATIONS: Heparin: 3000 Units total. Verapamil: 2.53mNitroglycerin: 10049mograms CONTRAST:  55m69mNIPAQUE IOHEXOL 300 MG/ML  SOLNcc, Omnipaque 300 FLUOROSCOPY TIME:  FLUOROSCOPY TIME: See IR records TECHNIQUE: CATHETERS AND WIRES 5-French Simmons-2 glide catheter 0.035" glidewire VESSELS CATHETERIZED Left internal carotid Right vertebral Right radial VESSELS STUDIED Left internal carotid, head Right vertebral Right radial PROCEDURAL NARRATIVE A 5-Fr Simmons catheter was advanced over a 0.035 glidewire into the aortic arch. The above vessels were then sequentially catheterized and cervical / cerebral angiograms taken. Of note, there is significant calcific atherosclerotic disease involving the aortic arch and proximal great vessels. After multiple attempts at catheterization of the right internal carotid artery, I elected to complete the procedure after catheterization of the right vertebral and left internal. After review of images, the catheter was removed without  incident. FINDINGS: Left internal carotid: head: Injection reveals the presence of a widely patent ICA, A1, and M1 segments and their branches. There is a inferiorly and posteriorly directed aneurysm arising in relation to the origin of the left posterior communicating artery. Aneurysm measures approximately 7.5 x 5.4 mm with an approximately 3.5 mm neck. The parenchymal and venous phases are normal. The venous sinuses are widely patent. Right vertebral: Injection reveals the presence of a widely patent vertebral artery. This leads to a widely patent basilar artery that terminates in bilateral P1. The basilar apex is normal. No aneurysms, AVMs, or high-flow fistulas are seen. The parenchymal and venous phases are normal. The venous sinuses are widely patent. Right radial: Normal vessel. No significant atherosclerotic disease. Arterial sheath in adequate position. DISPOSITION: Upon completion of the study,  the radial sheath was removed and patent hemostasis was obtained with application of the Terumo TR band. The procedure was well tolerated and no early complications were observed. The patient was transferred to the holding area for further care and removal of the TR band. IMPRESSION: 1. 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 2. Significant calcific atherosclerotic disease involving the aortic arch with cobblestone appearance. The preliminary results of this procedure were shared with the patient and the patient's family. Electronically Signed   By: Consuella Lose   On: 04/11/2022 11:25   DG Chest Port 1 View  Result Date: 05/04/2022 CLINICAL DATA:  chest pain EXAM: PORTABLE CHEST 1 VIEW COMPARISON:  12/18/2019 FINDINGS: Heart is borderline in size. No confluent airspace opacities, effusions or edema. No acute bony abnormality. IMPRESSION: No active disease. Electronically Signed   By: Rolm Baptise M.D.   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 Patient does wish to proceed with diagnostic cerebral angiogram. ACCESS: The technical aspects of the procedure as well as its potential risks and benefits were reviewed with the patient. These risks included but were not limited bleeding, infection, allergic reaction, damage to organs or vital structures, stroke, non-diagnostic procedure, and the catastrophic outcomes of heart attack, coma, and death. With an understanding of these risks, informed consent was obtained and witnessed. The patient was placed in the supine position on the angiography table and the skin of right groin  prepped in the usual sterile fashion. The procedure was performed under local anesthesia (1%-solution of bicarbonate-buffered Lidocaine) and conscious sedation with 0.44m versed monitored by myself and the in-suite nurse using continuous pulse-oximetry, heart rate, and non-invasive blood-pressure. A 5- French sheath was introduced in the right radial artery under ultrasound guidance using Seldinger technique in order to directly visualize endoluminal access to the right radial artery with a micro puncture needle. A fluoro-phase sequence was used to document the sheath position. MEDICATIONS: Heparin: 3000 Units total. Verapamil: 2.530mNitroglycerin: 10051mograms CONTRAST:  35m2mNIPAQUE IOHEXOL 300 MG/ML  SOLNcc, Omnipaque 300 FLUOROSCOPY TIME:  FLUOROSCOPY TIME: See IR records TECHNIQUE: CATHETERS AND WIRES 5-French Simmons-2 glide catheter 0.035" glidewire VESSELS CATHETERIZED Left internal carotid Right vertebral Right radial VESSELS STUDIED Left internal carotid, head Right vertebral Right radial PROCEDURAL NARRATIVE A 5-Fr Simmons catheter was advanced over a 0.035 glidewire into the aortic arch. The above vessels  were then sequentially catheterized and cervical / cerebral angiograms taken. Of note, there is significant calcific atherosclerotic disease involving the aortic arch and proximal great vessels. After multiple attempts at catheterization of the right internal carotid artery, I elected to complete the procedure after catheterization of the right vertebral and left internal. After review of images, the catheter was removed without incident. FINDINGS: Left internal carotid: head: Injection reveals the presence of a widely patent ICA, A1, and M1 segments and their branches. There is a inferiorly and posteriorly directed aneurysm arising in relation to the origin of the left posterior communicating artery. Aneurysm measures approximately 7.5 x 5.4 mm with an approximately 3.5 mm neck. The parenchymal and  venous phases are normal. The venous sinuses are widely patent. Right vertebral: Injection reveals the presence of a widely patent vertebral artery. This leads to a widely patent basilar artery that terminates in bilateral P1. The basilar apex is normal. No aneurysms, AVMs, or high-flow fistulas are seen. The parenchymal and venous phases are normal. The venous sinuses are widely patent. Right radial: Normal vessel. No significant atherosclerotic disease. Arterial sheath in adequate position. DISPOSITION: Upon completion of the study, the radial sheath was removed and patent hemostasis was obtained with application of the Terumo TR band. The procedure was well tolerated and no early complications were observed. The patient was transferred to the holding area for further care and removal of the TR band. IMPRESSION: 1. 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 2. Significant calcific atherosclerotic disease involving the aortic arch with cobblestone appearance. The preliminary results of this procedure were shared with the patient and the patient's family. 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 Patient does wish to proceed with diagnostic cerebral angiogram. ACCESS: The technical aspects of the procedure as well as its potential risks and benefits were reviewed with the patient. These risks included but were not limited bleeding, infection, allergic reaction, damage to organs or vital structures, stroke, non-diagnostic procedure, and the catastrophic outcomes of heart attack, coma, and death. With an  understanding of these risks, informed consent was obtained and witnessed. The patient was placed in the supine position on the angiography table and the skin of right groin prepped in the usual sterile fashion. The procedure was performed under local anesthesia (1%-solution of bicarbonate-buffered Lidocaine) and conscious sedation with 0.81m versed monitored by myself and the in-suite nurse using continuous pulse-oximetry, heart rate, and non-invasive blood-pressure. A 5- French sheath was introduced in the right radial artery under ultrasound guidance using Seldinger technique in order to directly visualize endoluminal access to the right radial artery with a micro puncture needle. A fluoro-phase sequence was used to document the sheath position. MEDICATIONS: Heparin: 3000 Units total. Verapamil: 2.565mNitroglycerin: 10020mograms CONTRAST:  68m69mNIPAQUE IOHEXOL 300 MG/ML  SOLNcc, Omnipaque 300 FLUOROSCOPY TIME:  FLUOROSCOPY TIME: See IR records TECHNIQUE: CATHETERS AND WIRES 5-French Simmons-2 glide catheter 0.035" glidewire VESSELS CATHETERIZED Left internal carotid Right vertebral Right radial VESSELS STUDIED Left internal carotid, head Right vertebral Right radial PROCEDURAL NARRATIVE A 5-Fr Simmons catheter was advanced over a 0.035 glidewire into the aortic arch. The above vessels  were then sequentially catheterized and cervical / cerebral angiograms taken. Of note, there is significant calcific atherosclerotic disease involving the aortic arch and proximal great vessels. After multiple attempts at catheterization of the right internal carotid artery, I elected to complete the procedure after catheterization of the right vertebral and left internal. After review of images, the catheter was removed without incident. FINDINGS: Left internal carotid: head: Injection reveals the presence of a widely patent ICA, A1, and M1 segments and their branches. There is a inferiorly and posteriorly directed aneurysm  arising in relation to the origin of the left posterior communicating artery. Aneurysm measures approximately 7.5 x 5.4 mm with an approximately 3.5 mm neck. The parenchymal and venous phases are normal. The venous sinuses are widely patent. Right vertebral: Injection reveals the presence of a widely patent vertebral artery. This leads to a widely patent basilar artery that terminates in bilateral P1. The basilar apex is normal. No aneurysms, AVMs, or high-flow fistulas are seen. The parenchymal and venous phases are normal. The venous sinuses are widely patent. Right radial: Normal vessel. No significant atherosclerotic disease. Arterial sheath in adequate position. DISPOSITION: Upon completion of the study, the radial sheath was removed and patent hemostasis was obtained with application of the Terumo TR band. The procedure was well tolerated and no early complications were observed. The patient was transferred to the holding area for further care and removal of the TR band. IMPRESSION: 1. 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 2. Significant calcific atherosclerotic disease involving the aortic arch with cobblestone appearance. The preliminary results of this procedure were shared with the patient and the patient's family. Electronically Signed   By: Consuella Lose   On: 04/11/2022 11:25   (Echo, Carotid, EGD, Colonoscopy, ERCP)    Subjective:   Discharge Exam: Vitals:   05/07/22 0522 05/07/22 0939  BP: (!) 143/56 133/60  Pulse: 65 65  Resp: 19 19  Temp: 98.8 F (37.1 C) 98.3 F (36.8 C)  SpO2: 94% 91%   Vitals:   05/06/22 1625 05/06/22 2047 05/07/22 0522 05/07/22 0939  BP: (!) 131/54 (!) 132/49 (!) 143/56 133/60  Pulse: 70 69 65 65  Resp: 17 18 19 19   Temp: 98 F (36.7 C) 98.9 F (37.2 C) 98.8 F (37.1 C) 98.3 F (36.8 C)  TempSrc:  Oral Oral Oral  SpO2: 95% 96% 94% 91%    General: Pt is alert, awake, not in acute  distress Cardiovascular: RRR, S1/S2 +, no rubs, no gallops Respiratory: CTA bilaterally, no wheezing, no rhonchi Abdominal: Soft, NT, ND, bowel sounds + Extremities: no edema, no cyanosis    The results of significant diagnostics from this hospitalization (including imaging, microbiology, ancillary and laboratory) are listed below for reference.     Microbiology: Recent Results (from the past 240 hour(s))  SARS Coronavirus 2 by RT PCR (hospital order, performed in [facility] hospital lab) *cepheid single result test* Anterior Nasal Swab     Status: None   Collection Time: 05/04/22  5:10 AM   Specimen: Anterior Nasal Swab  Result Value Ref Range Status   SARS Coronavirus 2 by RT PCR NEGATIVE NEGATIVE Final    Comment: (NOTE) SARS-CoV-2 target nucleic acids are NOT DETECTED.  The SARS-CoV-2 RNA is generally detectable in upper and lower respiratory specimens during the acute phase of infection. The lowest concentration of SARS-CoV-2 viral copies this assay can detect is 250 copies / mL. A negative result does not preclude SARS-CoV-2  infection and should not be used as the sole basis for treatment or other patient management decisions.  A negative result may occur with improper specimen collection / handling, submission of specimen other than nasopharyngeal swab, presence of viral mutation(s) within the areas targeted by this assay, and inadequate number of viral copies (<250 copies / mL). A negative result must be combined with clinical observations, patient history, and epidemiological information.  Fact Sheet for Patients:   https://www.patel.info/  Fact Sheet for Healthcare Providers: https://hall.com/  This test is not yet approved or  cleared by the [place] FDA and has been authorized for detection and/or diagnosis of SARS-CoV-2 by FDA under an Emergency Use Authorization (EUA).  This EUA will remain in effect (meaning this  test can be used) for the duration of the COVID-19 declaration under Section 564(b)(1) of the Act, 21 U.S.C. section 360bbb-3(b)(1), unless the authorization is terminated or revoked sooner.  Performed at [facility], [place] [address] [place], [place] [address]   Gastrointestinal Panel by PCR , Stool     Status: Abnormal   Collection Time: 05/04/22  6:50 AM   Specimen: Stool  Result Value Ref Range Status   Campylobacter species DETECTED (A) NOT DETECTED Final    Comment: RESULT CALLED TO, READ BACK BY AND VERIFIED WITH: Janeloe Lamberang @0023  on 05/05/22 SKL    Plesimonas shigelloides NOT DETECTED NOT DETECTED Final   Salmonella species NOT DETECTED NOT DETECTED Final   Yersinia enterocolitica NOT DETECTED NOT DETECTED Final   Vibrio species NOT DETECTED NOT DETECTED Final   Vibrio cholerae NOT DETECTED NOT DETECTED Final   Enteroaggregative E coli (EAEC) NOT DETECTED NOT DETECTED Final   Enteropathogenic E coli (EPEC) NOT DETECTED NOT DETECTED Final   Enterotoxigenic E coli (ETEC) NOT DETECTED NOT DETECTED Final   Shiga like toxin producing E coli (STEC) NOT DETECTED NOT DETECTED Final   Shigella/Enteroinvasive E coli (EIEC) NOT DETECTED NOT DETECTED Final   Cryptosporidium NOT DETECTED NOT DETECTED Final   Cyclospora cayetanensis NOT DETECTED NOT DETECTED Final   Entamoeba histolytica NOT DETECTED NOT DETECTED Final   Giardia lamblia NOT DETECTED NOT DETECTED Final   Adenovirus F40/41 NOT DETECTED NOT DETECTED Final   Astrovirus NOT DETECTED NOT DETECTED Final   Norovirus GI/GII DETECTED (A) NOT DETECTED Final    Comment: RESULT CALLED TO, READ BACK BY AND VERIFIED WITH: Janeloe Lamberang @0023  on 05/05/22 SKL    Rotavirus A NOT DETECTED NOT DETECTED Final   Sapovirus (I, II, IV, and V) NOT DETECTED NOT DETECTED Final    Comment: Performed at [facility], [address] [place], [place] [address]  Culture, blood (Routine X 2) w Reflex to ID Panel     Status:  None (Preliminary result)   Collection Time: 05/04/22  6:54 AM   Specimen: BLOOD  Result Value Ref Range Status   Specimen Description BLOOD SITE NOT SPECIFIED  Final   Special Requests   Final    BOTTLES DRAWN AEROBIC AND ANAEROBIC Blood Culture adequate volume   Culture   Final    NO GROWTH 3 DAYS Performed at [facility], [address], [place], [place] [address]    Report Status PENDING  Incomplete  C Difficile Quick Screen w PCR reflex     Status: Abnormal   Collection Time: 05/04/22  6:57 AM   Specimen: Stool  Result Value Ref Range Status   C Diff antigen POSITIVE (A) NEGATIVE Final   C  Diff toxin NEGATIVE NEGATIVE Final   C Diff interpretation Results are indeterminate. See PCR results.  Final    Comment: Performed at [facility], [place] [address], [place], [place] [address]  C. Diff by PCR, Reflexed     Status: Abnormal   Collection Time: 05/04/22  6:57 AM  Result Value Ref Range Status   Toxigenic C. Difficile by PCR POSITIVE (A) NEGATIVE Final    Comment: Positive for toxigenic C. difficile with little to no toxin production. Only treat if clinical presentation suggests symptomatic illness. Performed at [facility], [place] [address], [place], [place] [address]   Culture, blood (Routine X 2) w Reflex to ID Panel     Status: None (Preliminary result)   Collection Time: 05/04/22  7:26 AM   Specimen: BLOOD  Result Value Ref Range Status   Specimen Description BLOOD SITE NOT SPECIFIED  Final   Special Requests   Final    BOTTLES DRAWN AEROBIC AND ANAEROBIC Blood Culture adequate volume   Culture   Final    NO GROWTH 3 DAYS Performed at [facility], [address], [place], [place] [address]    Report Status PENDING  Incomplete     Labs: BNP (last 3 results) No results for input(s): BNP in the last 8760 hours. Basic Metabolic Panel: Recent Labs  Lab 05/03/22 2025 05/04/22 0449 05/04/22 0722 05/05/22 0257 05/06/22 0234  NA 136 136 136  138 137  K 3.9 3.6 3.5 3.8 3.6  CL 104  --  104 109 108  CO2 20*  --  20* 20* 20*  GLUCOSE 115*  --  127* 99 140*  BUN 33*  --  39* 34* 24*  CREATININE 1.89*  --  2.51* 2.02* 1.67*  CALCIUM 9.2  --  8.5* 8.8* 8.8*  MG  --   --  2.0 2.3  --    Liver Function Tests: Recent Labs  Lab 05/03/22 2025 05/04/22 0722 05/05/22 0257  AST 49* 39 50*  ALT 42 36 40  ALKPHOS 37* 34* 36*  BILITOT 0.6 0.6 0.7  PROT 7.7 6.7 6.7  ALBUMIN 3.8 3.2* 3.1*   Recent Labs  Lab 05/03/22 2025  LIPASE 78*   No results for input(s): AMMONIA in the last 168 hours. CBC: Recent Labs  Lab 05/03/22 2025 05/04/22 0449 05/04/22 0722 05/06/22 0234  WBC 7.5  --  7.8 9.7  NEUTROABS 2.9  --  2.9  --   HGB 11.2* 11.6* 10.2* 10.3*  HCT 34.5* 34.0* 31.1* 30.4*  MCV 87.8  --  88.6 86.9  PLT 276  --  251 288   Cardiac Enzymes: No results for input(s): CKTOTAL, CKMB, CKMBINDEX, TROPONINI in the last 168 hours. BNP: Invalid input(s): POCBNP CBG: Recent Labs  Lab 05/06/22 0727 05/06/22 1135 05/06/22 1621 05/06/22 2047 05/07/22 0721  GLUCAP 114* 142* 116* 129* 116*   D-Dimer No results for input(s): DDIMER in the last 72 hours. Hgb A1c No results for input(s): HGBA1C in the last 72 hours. Lipid Profile No results for input(s): CHOL, HDL, LDLCALC, TRIG, CHOLHDL, LDLDIRECT in the last 72 hours. Thyroid function studies No results for input(s): TSH, T4TOTAL, T3FREE, THYROIDAB in the last 72 hours.  Invalid input(s): FREET3 Anemia work up No results for input(s): VITAMINB12, FOLATE, FERRITIN, TIBC, IRON, RETICCTPCT in the last 72 hours. Urinalysis    Component Value Date/Time   COLORURINE YELLOW 05/05/2022 0903   APPEARANCEUR HAZY (A) 05/05/2022 0903   LABSPEC  1.009 05/05/2022 0903   PHURINE 5.0 05/05/2022 0903   GLUCOSEU 50 (A) 05/05/2022 0903   HGBUR NEGATIVE 05/05/2022 0903   BILIRUBINUR NEGATIVE 05/05/2022 0903   BILIRUBINUR negative 09/09/2018 1628   BILIRUBINUR neg 05/22/2018 1451    KETONESUR NEGATIVE 05/05/2022 0903   PROTEINUR NEGATIVE 05/05/2022 0903   UROBILINOGEN 0.2 09/09/2018 1628   NITRITE NEGATIVE 05/05/2022 0903   LEUKOCYTESUR NEGATIVE 05/05/2022 4098   Sepsis Labs Invalid input(s): PROCALCITONIN,  WBC,  LACTICIDVEN Microbiology Recent Results (from the past 240 hour(s))  SARS Coronavirus 2 by RT PCR (hospital order, performed in [facility] hospital lab) *cepheid single result test* Anterior Nasal Swab     Status: None   Collection Time: 05/04/22  5:10 AM   Specimen: Anterior Nasal Swab  Result Value Ref Range Status   SARS Coronavirus 2 by RT PCR NEGATIVE NEGATIVE Final    Comment: (NOTE) SARS-CoV-2 target nucleic acids are NOT DETECTED.  The SARS-CoV-2 RNA is generally detectable in upper and lower respiratory specimens during the acute phase of infection. The lowest concentration of SARS-CoV-2 viral copies this assay can detect is 250 copies / mL. A negative result does not preclude SARS-CoV-2 infection and should not be used as the sole basis for treatment or other patient management decisions.  A negative result may occur with improper specimen collection / handling, submission of specimen other than nasopharyngeal swab, presence of viral mutation(s) within the areas targeted by this assay, and inadequate number of viral copies (<250 copies / mL). A negative result must be combined with clinical observations, patient history, and epidemiological information.  Fact Sheet for Patients:   https://www.patel.info/  Fact Sheet for Healthcare Providers: https://hall.com/  This test is not yet approved or  cleared by the [place] FDA and has been authorized for detection and/or diagnosis of SARS-CoV-2 by FDA under an Emergency Use Authorization (EUA).  This EUA will remain in effect (meaning this test can be used) for the duration of the COVID-19 declaration under Section 564(b)(1) of the Act, 21  U.S.C. section 360bbb-3(b)(1), unless the authorization is terminated or revoked sooner.  Performed at [facility], [place] [address] [place], [place] [address]   Gastrointestinal Panel by PCR , Stool     Status: Abnormal   Collection Time: 05/04/22  6:50 AM   Specimen: Stool  Result Value Ref Range Status   Campylobacter species DETECTED (A) NOT DETECTED Final    Comment: RESULT CALLED TO, READ BACK BY AND VERIFIED WITH: Janeloe Lamberang @0023  on 05/05/22 SKL    Plesimonas shigelloides NOT DETECTED NOT DETECTED Final   Salmonella species NOT DETECTED NOT DETECTED Final   Yersinia enterocolitica NOT DETECTED NOT DETECTED Final   Vibrio species NOT DETECTED NOT DETECTED Final   Vibrio cholerae NOT DETECTED NOT DETECTED Final   Enteroaggregative E coli (EAEC) NOT DETECTED NOT DETECTED Final   Enteropathogenic E coli (EPEC) NOT DETECTED NOT DETECTED Final   Enterotoxigenic E coli (ETEC) NOT DETECTED NOT DETECTED Final   Shiga like toxin producing E coli (STEC) NOT DETECTED NOT DETECTED Final   Shigella/Enteroinvasive E coli (EIEC) NOT DETECTED NOT DETECTED Final   Cryptosporidium NOT DETECTED NOT DETECTED Final   Cyclospora cayetanensis NOT DETECTED NOT DETECTED Final   Entamoeba histolytica NOT DETECTED NOT DETECTED Final   Giardia lamblia NOT DETECTED NOT DETECTED Final   Adenovirus F40/41 NOT DETECTED NOT DETECTED Final   Astrovirus NOT DETECTED NOT DETECTED Final   Norovirus GI/GII DETECTED (A) NOT DETECTED Final  Comment: RESULT CALLED TO, READ BACK BY AND VERIFIED WITH: Janeloe Lamberang @0023  on 05/05/22 SKL    Rotavirus A NOT DETECTED NOT DETECTED Final   Sapovirus (I, II, IV, and V) NOT DETECTED NOT DETECTED Final    Comment: Performed at [facility], [address] [place], [place] [address]  Culture, blood (Routine X 2) w Reflex to ID Panel     Status: None (Preliminary result)   Collection Time: 05/04/22  6:54 AM   Specimen: BLOOD  Result Value Ref  Range Status   Specimen Description BLOOD SITE NOT SPECIFIED  Final   Special Requests   Final    BOTTLES DRAWN AEROBIC AND ANAEROBIC Blood Culture adequate volume   Culture   Final    NO GROWTH 3 DAYS Performed at [facility], [address] [place], [place] [address]    Report Status PENDING  Incomplete  C Difficile Quick Screen w PCR reflex     Status: Abnormal   Collection Time: 05/04/22  6:57 AM   Specimen: Stool  Result Value Ref Range Status   C Diff antigen POSITIVE (A) NEGATIVE Final   C Diff toxin NEGATIVE NEGATIVE Final   C Diff interpretation Results are indeterminate. See PCR results.  Final    Comment: Performed at [facility], [place] [address], [place], [place] [address]  C. Diff by PCR, Reflexed     Status: Abnormal   Collection Time: 05/04/22  6:57 AM  Result Value Ref Range Status   Toxigenic C. Difficile by PCR POSITIVE (A) NEGATIVE Final    Comment: Positive for toxigenic C. difficile with little to no toxin production. Only treat if clinical presentation suggests symptomatic illness. Performed at [facility], [place] [address], [place], [place] [address]   Culture, blood (Routine X 2) w Reflex to ID Panel     Status: None (Preliminary result)   Collection Time: 05/04/22  7:26 AM   Specimen: BLOOD  Result Value Ref Range Status   Specimen Description BLOOD SITE NOT SPECIFIED  Final   Special Requests   Final    BOTTLES DRAWN AEROBIC AND ANAEROBIC Blood Culture adequate volume   Culture   Final    NO GROWTH 3 DAYS Performed at [facility], [address], [place], [place] [address]    Report Status PENDING  Incomplete     Time coordinating discharge: Over 30 minutes  SIGNED:   Phillips Climes, MD  [facility] 05/07/2022, 5:35 PM Pager   If 7PM-7AM, please contact night-coverage [email or web address] Password [email or web address]

## 2022-05-08 ENCOUNTER — Telehealth: Payer: Self-pay

## 2022-05-08 NOTE — Telephone Encounter (Signed)
Transition Care Management Follow-up Telephone Call Date of discharge and from where: [place] 05-07-22 Dx: Acute respiratory failure with hypoxia How have you been since you were released from the hospital? Doing some better  Any questions or concerns? No  Items Reviewed: Did the pt receive and understand the discharge instructions provided? Yes  Medications obtained and verified? Yes  Other? No  Any new allergies since your discharge? No  Dietary orders reviewed? Yes Do you have support at home? Yes   Home Care and Equipment/Supplies: Were home health services ordered? no If so, what is the name of the agency? na  Has the agency set up a time to come to the patient's home? not applicable Were any new equipment or medical supplies ordered?  No What is the name of the medical supply agency? na Were you able to get the supplies/equipment? not applicable Do you have any questions related to the use of the equipment or supplies? No  Functional Questionnaire: (I = Independent and D = Dependent) ADLs: I  Bathing/Dressing- I  Meal Prep- I  Eating- I  Maintaining continence- I  Transferring/Ambulation- I  Managing Meds- I  Follow up appointments reviewed:  PCP Hospital f/u appt confirmed? Yes  Scheduled to see Dr Lorelei Pont on 05-15-22 @ 220pm. [facility] f/u appt confirmed? No . Are transportation arrangements needed? No  If their condition worsens, is the pt aware to call PCP or go to the Emergency Dept.? Yes Was the patient provided with contact information for the PCP's office or ED? Yes Was to pt encouraged to call back with questions or concerns? Yes

## 2022-05-09 LAB — CULTURE, BLOOD (ROUTINE X 2)
Culture: NO GROWTH
Culture: NO GROWTH
Special Requests: ADEQUATE
Special Requests: ADEQUATE

## 2022-05-12 DIAGNOSIS — N3020 Other chronic cystitis without hematuria: Secondary | ICD-10-CM | POA: Diagnosis not present

## 2022-05-14 NOTE — Progress Notes (Addendum)
[place] [facility] [address], [place], [place] [address] [phone]  Date:  05/15/2022   Name:  Mandy Kim   DOB:  1946/04/15   MRN:  [id number]  PCP:  Darreld Mclean, MD    Chief Complaint: Hospitalization Follow-up (05/03/22: Acute Respiratory Failure with Hypoxia. Pt has completed her antibiotics. /Concerns/ questions: none/)   History of Present Illness:  Amorina Doerr Heideman is a 76 y.o. very pleasant female patient who presents with the following:  Patient seen today for follow-up from recent hospital admission; as below, patient presented to the ER with several days of severe diarrhea.  She was also found to have hypoxemia at that time, elevated creatinine Last seen by myself in September History of poorly controlled diabetes, fatty liver, AAA status post repair 2013, hyperlipidemia, cervical cancer status post hysterectomy year 2000, bilateral breast cancer status post double mastectomy  She was on chronic keflex for UTI prophylaxis but this was stopped due to C. difficile diarrhea She does not know where she picked up campylobacter  She underwent a chest CT as part of her evaluation, it showed severe CAD.  Discussed with patient, she is willing to see a cardiologist IMPRESSION: 1. Severe coronary artery calcifications.  Heart size is normal. 2. Atherosclerotic calcifications seen throughout the thoracic aorta without aneurysmal dilatation. 3. Mild emphysematous changes the lungs. No focal airspace opacity to indicate pneumonia.  Admit date: 05/03/2022 Discharge date: 05/07/2022 Recommendations for Outpatient Follow-up:  Follow up with PCP in 1-2 weeks Please obtain BMP/CBC in one week Acute respiratory failure with hypoxia ([place]) - Patient incidentally found to be hypoxic upon arrival with Dr. Marta Lamas in the mid 80s - Room air ABG does indeed reveal PaO2 of 59 on room air consistent with mild acute hypoxic  respiratory failure - Chest x-ray reveals no evidence of obvious acute cardiopulmonary process - D-dimer unremarkable -CT chest with evidence of mild emphysema, does not appear to be in exacerbation -This has resolved, no oxygen requirement on discharge Campylobacter diarrhea -She presents with significant diarrhea for last 4 to 5 days, work-up significant for Campylobacter, he was treated with 3 days of azithromycin . -Symptoms well tested positive for norovirus C. difficile diarrhea -She is on chronic Keflex suppression therapy -Her C. difficile toxin PCR is positive, started on vancomycin.  Finished total of 10 days of therapy -Avoid PPI. AKI (acute kidney injury) ([place]) -Creatinine peaked at 2.5, this has improved, 1.67 on discharge -No acute finding in her renal ultrasound. Type 2 diabetes mellitus with diabetic polyneuropathy, with long-term current use of insulin (HCC) Continue with home regimen Essential hypertension -Blood pressure has been stable during hospital stay, she is instructed to resume her losartan in 5 days   Since getting back home Mac[place] notes she is improving She is still not 100%- she is tired, has more difficulty getting things done   Her most recent A1c showed reasonable control of diabetes We will follow-up on CBC, BMP today Eye exam  She is taking 60 units of tresiba but will adjust a bit if her glucose numbers are low  She notes she is back to her baseline loose stools, but watery diarrhea is resolved She did have a colonoscopy in 2013-5-year follow-up was recommended but patient has declined Wt Readings from Last 3 Encounters:  05/15/22 173 lb 12.8 oz (78.8 kg)  04/11/22 180 lb (81.6 kg)  02/20/22 179 lb 3.2 oz (81.3 kg)  Lab Results  Component Value Date   HGBA1C 7.9 (H) 02/20/2022   Lab Results  Component Value Date   TSH 2.16 02/20/2022    Patient Active Problem List   Diagnosis Date Noted   Hypoxia 05/05/2022   Acute diarrhea  05/04/2022   Mixed hyperlipidemia due to type 2 diabetes mellitus ([place]) 05/04/2022   Essential hypertension 05/04/2022   GERD without esophagitis 05/04/2022   AKI (acute kidney injury) ([place]) 05/04/2022   Hypothyroidism 05/04/2022   Dark stools 05/04/2022   Myositis, unspecified 04/27/2020   Prolapse of female pelvic organs 05/24/2019   Cystocele with prolapse 05/23/2019   Acute respiratory failure with hypoxia (HCC)    Sepsis ([place]) 05/28/2017   Elevated LFTs 05/28/2017   Acute lower UTI 05/28/2017   Dehydration 05/28/2017   Peripheral neuropathy 07/27/2014   AAA (abdominal aortic aneurysm) ([place]) 02/12/2012   Fatty liver 02/12/2012   Breast cancer ([place]) 02/12/2012   Cervical cancer ([place]) 02/12/2012   Type 2 diabetes mellitus with diabetic polyneuropathy, with long-term current use of insulin ([place]) 02/12/2012   Hyperlipidemia 02/12/2012    Past Medical History:  Diagnosis Date   Anticoagulant long-term use    plavix   Cancer (HCC)    CHF (congestive heart failure) ([place]) 2000   Chronic cystitis    Chronic kidney disease    Diverticulitis    GERD (gastroesophageal reflux disease)    Hiatal hernia    History of basal cell carcinoma excision    face/ nose right side   History of bilateral breast cancer followed by oncologist until 2008,  per pt no recurrence   right 2002  and left 2003 s/p  mastectomy with completed chemo and tamexifen therapy    History of cervical cancer 2000   s/p  TAH w/ BSO   History of diverticulitis of colon    Hyperlipidemia    OSA on CPAP    PAD (peripheral artery disease) ([place])    Peripheral neuropathy    PONV (postoperative nausea and vomiting)    S/P AAA (abdominal aortic aneurysm) repair 09/15/2011   Stress incontinence due to pelvic organ prolapse    Type 2 diabetes mellitus treated with insulin ([place])    followed by pcp   Wears glasses     Past Surgical History:  Procedure Laterality Date   ABDOMINAL HYSTERECTOMY  2000   w/  BSO,  and  Bladder tacking abdominally   CARPAL TUNNEL RELEASE Left 1999   CATARACT EXTRACTION W/ INTRAOCULAR LENS  IMPLANT, BILATERAL  2015   ENDOVASCULAR REPAIR/STENT GRAFT  09-15-2011    @[facility]    INCONTINENCE SURGERY  2007  approx.   IR ANGIO INTRA EXTRACRAN SEL INTERNAL CAROTID UNI L MOD SED  04/11/2022   IR ANGIO VERTEBRAL SEL VERTEBRAL UNI R MOD SED  04/11/2022   IR US GUIDE VASC ACCESS RIGHT  04/11/2022   MASTECTOMY Bilateral right 2002;  left 2003   lymph node dissection done only on right side   ROBOTIC ASSISTED LAPAROSCOPIC SACROCOLPOPEXY N/A 05/23/2019   Procedure: XI ROBOTIC ASSISTED LAPAROSCOPIC SACROCOLPOPEXY;  Surgeon: Ardis Hughs, MD;  Location: [facility];  Service: Urology;  Laterality: N/A;   TONSILLECTOMY  child   TYMPANOPLASTY Right 1970s   "had my ear drum replacement with a plastic one"   WRIST GANGLION EXCISION Left 1998    Social History   Tobacco Use   Smoking status: Former    Years: 30.00    Types: Cigarettes    Quit date: 06/07/1999  Years since quitting: 22.9   Smokeless tobacco: Never  Vaping Use   Vaping Use: Never used  Substance Use Topics   Alcohol use: Never   Drug use: Never    Family History  Problem Relation Age of Onset   Cancer Mother    Pneumonia Mother    Thyroid disease Mother    Thyroid disease Sister    Obesity Daughter    Hypertension Daughter    Thyroid disease Son    Pneumonia Maternal Grandmother    Heart attack Maternal Grandfather    Diabetes Neg Hx     Allergies  Allergen Reactions   Aspirin     Per pt any pain medications - causes fluid build up    Bee Venom Swelling   Ibuprofen Swelling   Ozempic (0.25 Or 0.5 Mg-Dose) [Semaglutide(0.25 Or 0.15m-Dos)] Diarrhea and Nausea And Vomiting    Flatulence   Poison [place] Extract Itching and Swelling   Statins     "skin feels like bugs under the skin"   Trulicity [Dulaglutide] Nausea And Vomiting and Other (See Comments)    Flatulance, "deathly sick"   Tylenol [Acetaminophen]  Swelling   Victoza [Liraglutide] Other (See Comments)    Stomach upset issues     Medication list has been reviewed and updated.  Current Outpatient Medications on File Prior to Visit  Medication Sig Dispense Refill   Accu-Chek Softclix Lancets lancets TEST BLOOD SUGAR THREE TIMES DAILY 300 each 1   Alcohol Swabs (DROPSAFE ALCOHOL PREP) 70 % PADS USE TO TEST BLOOD SUGAR 3 TIMES DAILY. 300 each 2   Alcohol Swabs PADS Use to test blood sugar 3 times daily. Dx: E11.9 300 each 2   b complex vitamins tablet Take 0.5 tablets by mouth 2 (two) times daily.      Biotin 5 MG TABS Take 5,000 mcg by mouth at bedtime.      Blood Glucose Calibration (GLUCOSE CONTROL) SOLN Use to test blood sugar 3 times daily. Dx: E11.9 1 each 2   Blood Glucose Monitoring Suppl (ACCU-CHEK AVIVA PLUS) w/Device KIT USE AS DIRECTED  TO TEST BLOOD GLUCOSE BID E11.9 1 kit 0   clopidogrel (PLAVIX) 75 MG tablet TAKE 1 TABLET EVERY DAY 90 tablet 1   Cranberry 500 MG TABS Take 500 mg by mouth daily.     famotidine (PEPCID) 20 MG tablet Take 1 tablet (20 mg total) by mouth daily. 60 tablet 1   gemfibrozil (LOPID) 600 MG tablet TAKE 1 TABLET TWICE DAILY BEFORE MEALS 180 tablet 1   glipiZIDE (GLUCOTROL) 5 MG tablet TAKE 1 TABLET TWICE DAILY BEFORE A MEAL 180 tablet 1   Insulin Pen Needle 32G X 4 MM MISC Used to inject insulin 1x daily. 100 each 4   Insulin Syringe-Needle U-100 (INSULIN SYRINGE 1CC/30GX5/16") 30G X 5/16" 1 ML MISC Use to inject insulin 1 time per day. 100 each 2   losartan (COZAAR) 50 MG tablet Take 1 tablet (50 mg total) by mouth daily. 90 tablet 0   magnesium oxide (MAG-OX) 400 MG tablet Take 200 mg by mouth daily.     Menthol, Topical Analgesic, (BIOFREEZE ROLL-ON EX) Apply 1 application topically as needed (joint pain).     metFORMIN (GLUCOPHAGE) 1000 MG tablet TAKE 1 TABLET TWICE DAILY WITH MEALS 180 tablet 1   Multiple Vitamin (MULTIVITAMIN) tablet Take 0.5 tablets by mouth 2 (two) times daily.      niacin  (NIASPAN) 500 MG CR tablet Take 1 tablet (500 mg total) by  mouth at bedtime. 30 tablet 3   Omega-3 Fatty Acids (FISH OIL) 1000 MG CAPS Take 1,000 mg by mouth 2 (two) times a day.      potassium chloride SA (KLOR-CON M) 20 MEQ tablet TAKE 1 TABLET EVERY DAY 90 tablet 1   Probiotic Product (PROBIOTIC DAILY PO) Take 1 capsule by mouth daily.     TRESIBA FLEXTOUCH 100 UNIT/ML FlexTouch Pen INJECT  60 UNITS EVERY DAY AT BEDTIME 60 mL 3   VITAMIN D PO Take 1 capsule by mouth daily.     zinc gluconate 50 MG tablet Take 50 mg by mouth at bedtime.     No current facility-administered medications on file prior to visit.    Review of Systems:  As per HPI- otherwise negative.   Physical Examination: Vitals:   05/15/22 1424  BP: 132/78  Pulse: 72  Resp: 18  Temp: 97.9 F (36.6 C)  SpO2: 92%   Vitals:   05/15/22 1424  Weight: 173 lb 12.8 oz (78.8 kg)  Height: 5' 4.17" (1.63 m)   Body mass index is 29.67 kg/m. Ideal Body Weight: Weight in (lb) to have BMI = 25: 146.1  GEN: no acute distress.  Overweight, appears her normal self HEENT: Atraumatic, Normocephalic.  Ears and Nose: No external deformity. CV: RRR, No M/G/R. No JVD. No thrill. No extra heart sounds. PULM: CTA B, no wheezes, crackles, rhonchi. No retractions. No resp. distress. No accessory muscle use. ABD: S, NT, ND, +BS. No rebound. No HSM.  Belly is benign EXTR: No c/c/e PSYCH: Normally interactive. Conversant.    Assessment and Plan: Hospital discharge follow-up  Essential hypertension, benign - Plan: CBC, Basic metabolic panel, Ambulatory referral to Cardiology  Dyslipidemia  Acquired hypothyroidism  Type 2 diabetes mellitus with complication, without long-term current use of insulin (HCC)  Campylobacter diarrhea  C. difficile colitis  Coronary artery disease involving native coronary artery of native heart without angina pectoris - Plan: Ambulatory referral to Cardiology  Today from recent hospital  discharge.  Check on labs as above.  Overall she is feeling much better, diarrhea is resolved Referral to cardiology to take a closer look at CAD noted on chest CT With labs, will once again offered to refer patient to GI for colon cancer screening Blood pressure, diabetes under okay control  Signed Lamar Blinks, MD  Received labs as below, letter to patient Also called patient 6/15 to go over her lab details Advised renal function is improved but still not back at baseline.  Please decrease metformin to 1 tablet daily until recheck Also note some anemia.  Recommend evaluation with GI.  She is okay with a referral Patient also states she is having possible UTI symptoms.  She has history of multiple UTI, also recent C. difficile infection.  As such, would like to get a urine culture prior to any treatment.  She will come in tomorrow around 1 PM to leave a urine sample.  She does have a urologist, but states they cannot see her until next week  Results for orders placed or performed in visit on 05/15/22  CBC  Result Value Ref Range   WBC 8.8 4.0 - 10.5 K/uL   RBC 3.73 (L) 3.87 - 5.11 Mil/uL   Platelets 383.0 150.0 - 400.0 K/uL   Hemoglobin 11.3 (L) 12.0 - 15.0 g/dL   HCT 33.2 (L) 36.0 - 46.0 %   MCV 89.0 78.0 - 100.0 fl   MCHC 34.0 30.0 - 36.0 g/dL  RDW 17.5 (H) 11.5 - 25.4 %  Basic metabolic panel  Result Value Ref Range   Sodium 140 135 - 145 mEq/L   Potassium 4.5 3.5 - 5.1 mEq/L   Chloride 103 96 - 112 mEq/L   CO2 25 19 - 32 mEq/L   Glucose, Bld 115 (H) 70 - 99 mg/dL   BUN 27 (H) 6 - 23 mg/dL   Creatinine, Ser 1.48 (H) 0.40 - 1.20 mg/dL   GFR 33.93 (L) >60.00 mL/min   Calcium 10.2 8.4 - 10.5 mg/dL   Received urine culture 6/19- called pt Culture negative  She notes she still has sx which she thinks may be a UTI, but she is seeing urology today

## 2022-05-14 NOTE — Patient Instructions (Incomplete)
It was good to see you again today, I will be in touch with your labs Assuming all is well please see me in about 3 months We will have you see cardiology regarding plaque build- up in your coronary arteries

## 2022-05-15 ENCOUNTER — Other Ambulatory Visit: Payer: Self-pay | Admitting: Family Medicine

## 2022-05-15 ENCOUNTER — Ambulatory Visit (INDEPENDENT_AMBULATORY_CARE_PROVIDER_SITE_OTHER): Payer: Medicare HMO | Admitting: Family Medicine

## 2022-05-15 VITALS — BP 132/78 | HR 72 | Temp 97.9°F | Resp 18 | Ht 64.17 in | Wt 173.8 lb

## 2022-05-15 DIAGNOSIS — I10 Essential (primary) hypertension: Secondary | ICD-10-CM

## 2022-05-15 DIAGNOSIS — A045 Campylobacter enteritis: Secondary | ICD-10-CM

## 2022-05-15 DIAGNOSIS — I2510 Atherosclerotic heart disease of native coronary artery without angina pectoris: Secondary | ICD-10-CM | POA: Diagnosis not present

## 2022-05-15 DIAGNOSIS — E785 Hyperlipidemia, unspecified: Secondary | ICD-10-CM

## 2022-05-15 DIAGNOSIS — Z09 Encounter for follow-up examination after completed treatment for conditions other than malignant neoplasm: Secondary | ICD-10-CM

## 2022-05-15 DIAGNOSIS — E039 Hypothyroidism, unspecified: Secondary | ICD-10-CM

## 2022-05-15 DIAGNOSIS — Z1211 Encounter for screening for malignant neoplasm of colon: Secondary | ICD-10-CM

## 2022-05-15 DIAGNOSIS — R300 Dysuria: Secondary | ICD-10-CM

## 2022-05-15 DIAGNOSIS — E1142 Type 2 diabetes mellitus with diabetic polyneuropathy: Secondary | ICD-10-CM

## 2022-05-15 DIAGNOSIS — A0472 Enterocolitis due to Clostridium difficile, not specified as recurrent: Secondary | ICD-10-CM

## 2022-05-15 DIAGNOSIS — E118 Type 2 diabetes mellitus with unspecified complications: Secondary | ICD-10-CM

## 2022-05-16 DIAGNOSIS — Z853 Personal history of malignant neoplasm of breast: Secondary | ICD-10-CM | POA: Insufficient documentation

## 2022-05-16 DIAGNOSIS — C801 Malignant (primary) neoplasm, unspecified: Secondary | ICD-10-CM | POA: Insufficient documentation

## 2022-05-16 DIAGNOSIS — D1801 Hemangioma of skin and subcutaneous tissue: Secondary | ICD-10-CM | POA: Diagnosis not present

## 2022-05-16 DIAGNOSIS — Z8719 Personal history of other diseases of the digestive system: Secondary | ICD-10-CM | POA: Insufficient documentation

## 2022-05-16 DIAGNOSIS — N393 Stress incontinence (female) (male): Secondary | ICD-10-CM | POA: Insufficient documentation

## 2022-05-16 DIAGNOSIS — I739 Peripheral vascular disease, unspecified: Secondary | ICD-10-CM | POA: Insufficient documentation

## 2022-05-16 DIAGNOSIS — Z9989 Dependence on other enabling machines and devices: Secondary | ICD-10-CM | POA: Insufficient documentation

## 2022-05-16 DIAGNOSIS — K449 Diaphragmatic hernia without obstruction or gangrene: Secondary | ICD-10-CM | POA: Insufficient documentation

## 2022-05-16 DIAGNOSIS — X32XXXS Exposure to sunlight, sequela: Secondary | ICD-10-CM | POA: Diagnosis not present

## 2022-05-16 DIAGNOSIS — Z9889 Other specified postprocedural states: Secondary | ICD-10-CM | POA: Insufficient documentation

## 2022-05-16 DIAGNOSIS — K5792 Diverticulitis of intestine, part unspecified, without perforation or abscess without bleeding: Secondary | ICD-10-CM | POA: Insufficient documentation

## 2022-05-16 DIAGNOSIS — N3020 Other chronic cystitis without hematuria: Secondary | ICD-10-CM | POA: Insufficient documentation

## 2022-05-16 DIAGNOSIS — L720 Epidermal cyst: Secondary | ICD-10-CM | POA: Diagnosis not present

## 2022-05-16 DIAGNOSIS — Z973 Presence of spectacles and contact lenses: Secondary | ICD-10-CM | POA: Insufficient documentation

## 2022-05-16 DIAGNOSIS — L570 Actinic keratosis: Secondary | ICD-10-CM | POA: Diagnosis not present

## 2022-05-16 DIAGNOSIS — N189 Chronic kidney disease, unspecified: Secondary | ICD-10-CM | POA: Insufficient documentation

## 2022-05-16 DIAGNOSIS — Z85828 Personal history of other malignant neoplasm of skin: Secondary | ICD-10-CM | POA: Insufficient documentation

## 2022-05-16 DIAGNOSIS — Z7901 Long term (current) use of anticoagulants: Secondary | ICD-10-CM | POA: Insufficient documentation

## 2022-05-16 DIAGNOSIS — L814 Other melanin hyperpigmentation: Secondary | ICD-10-CM | POA: Diagnosis not present

## 2022-05-16 LAB — CBC
HCT: 33.2 % — ABNORMAL LOW (ref 36.0–46.0)
Hemoglobin: 11.3 g/dL — ABNORMAL LOW (ref 12.0–15.0)
MCHC: 34 g/dL (ref 30.0–36.0)
MCV: 89 fl (ref 78.0–100.0)
Platelets: 383 10*3/uL (ref 150.0–400.0)
RBC: 3.73 Mil/uL — ABNORMAL LOW (ref 3.87–5.11)
RDW: 17.5 % — ABNORMAL HIGH (ref 11.5–15.5)
WBC: 8.8 10*3/uL (ref 4.0–10.5)

## 2022-05-16 LAB — BASIC METABOLIC PANEL
BUN: 27 mg/dL — ABNORMAL HIGH (ref 6–23)
CO2: 25 mEq/L (ref 19–32)
Calcium: 10.2 mg/dL (ref 8.4–10.5)
Chloride: 103 mEq/L (ref 96–112)
Creatinine, Ser: 1.48 mg/dL — ABNORMAL HIGH (ref 0.40–1.20)
GFR: 33.93 mL/min — ABNORMAL LOW (ref >60.00)
Glucose, Bld: 115 mg/dL — ABNORMAL HIGH (ref 70–99)
Potassium: 4.5 mEq/L (ref 3.5–5.1)
Sodium: 140 mEq/L (ref 135–145)

## 2022-05-18 ENCOUNTER — Encounter: Payer: Self-pay | Admitting: Cardiology

## 2022-05-18 ENCOUNTER — Ambulatory Visit: Payer: Medicare HMO | Admitting: Cardiology

## 2022-05-18 VITALS — BP 148/72 | HR 74 | Ht 64.0 in | Wt 174.1 lb

## 2022-05-18 DIAGNOSIS — E876 Hypokalemia: Secondary | ICD-10-CM | POA: Insufficient documentation

## 2022-05-18 DIAGNOSIS — I2510 Atherosclerotic heart disease of native coronary artery without angina pectoris: Secondary | ICD-10-CM

## 2022-05-18 DIAGNOSIS — E1169 Type 2 diabetes mellitus with other specified complication: Secondary | ICD-10-CM | POA: Diagnosis not present

## 2022-05-18 DIAGNOSIS — K635 Polyp of colon: Secondary | ICD-10-CM | POA: Insufficient documentation

## 2022-05-18 DIAGNOSIS — I10 Essential (primary) hypertension: Secondary | ICD-10-CM | POA: Diagnosis not present

## 2022-05-18 DIAGNOSIS — C44310 Basal cell carcinoma of skin of unspecified parts of face: Secondary | ICD-10-CM | POA: Insufficient documentation

## 2022-05-18 DIAGNOSIS — N189 Chronic kidney disease, unspecified: Secondary | ICD-10-CM

## 2022-05-18 DIAGNOSIS — E782 Mixed hyperlipidemia: Secondary | ICD-10-CM | POA: Diagnosis not present

## 2022-05-18 DIAGNOSIS — I671 Cerebral aneurysm, nonruptured: Secondary | ICD-10-CM | POA: Insufficient documentation

## 2022-05-18 DIAGNOSIS — I739 Peripheral vascular disease, unspecified: Secondary | ICD-10-CM | POA: Insufficient documentation

## 2022-05-18 DIAGNOSIS — G5603 Carpal tunnel syndrome, bilateral upper limbs: Secondary | ICD-10-CM | POA: Insufficient documentation

## 2022-05-18 DIAGNOSIS — IMO0002 Reserved for concepts with insufficient information to code with codable children: Secondary | ICD-10-CM | POA: Insufficient documentation

## 2022-05-18 NOTE — Patient Instructions (Addendum)
Medication Instructions:  Your physician recommends that you continue on your current medications as directed. Please refer to the Current Medication list given to you today.  *If you need a refill on your cardiac medications before your next appointment, please call your pharmacy*   Lab Work: None If you have labs (blood work) drawn today and your tests are completely normal, you will receive your results only by: [place] (if you have MyChart) OR A paper copy in the mail If you have any lab test that is abnormal or we need to change your treatment, we will call you to review the results.   Testing/Procedures:   [facility] at [facility] [address] [place], [place] [address] Phone: [phone]    Please arrive 15 minutes prior to your appointment time for registration and insurance purposes.  The test will take approximately 3 to 4 hours to complete; you may bring reading material.  If someone comes with you to your appointment, they will need to remain in the main lobby due to limited space in the testing area. **If you are pregnant or breastfeeding, please notify the nuclear lab prior to your appointment**  How to prepare for your Myocardial Perfusion Test: Do not eat or drink 3 hours prior to your test, except you may have water. Do not consume products containing caffeine (regular or decaffeinated) 12 hours prior to your test. (ex: coffee, chocolate, sodas, tea). Do bring a list of your current medications with you.  If not listed below, you may take your medications as normal. Take half of long acting insulin the night before test Do wear comfortable clothes (no dresses or overalls) and walking shoes, tennis shoes preferred (No heels or open toe shoes are allowed). Do NOT wear cologne, perfume, aftershave, or lotions (deodorant is allowed). If these instructions are not followed, your test will have to be  rescheduled.  Please report to [address], [address] for your test.  If you have questions or concerns about your appointment, you can call the Nuclear Lab at [phone].  If you cannot keep your appointment, please provide 24 hours notification to the Nuclear Lab, to avoid a possible $50 charge to your account.      Follow-Up: At [facility], you and your health needs are our priority.  As part of our continuing mission to provide you with exceptional heart care, we have created designated Provider Care Teams.  These Care Teams include your primary Cardiologist (physician) and Advanced Practice Providers (APPs -  Physician Assistants and Nurse Practitioners) who all work together to provide you with the care you need, when you need it.  We recommend signing up for the patient portal called "MyChart".  Sign up information is provided on this After Visit Summary.  MyChart is used to connect with patients for Virtual Visits (Telemedicine).  Patients are able to view lab/test results, encounter notes, upcoming appointments, etc.  Non-urgent messages can be sent to your provider as well.   To learn more about what you can do with MyChart, go to [email or web address].    Your next appointment:   Follow up as needed  The format for your next appointment:   In Person  Provider:   Shirlee More, MD    Other Instructions None  Important Information About Sugar

## 2022-05-18 NOTE — Progress Notes (Signed)
Cardiology Office Note:    Date:  05/18/2022   ID:  Mandy Kim, DOB [birth year], MRN [id number]  PCP:  Mandy Mclean, MD  Cardiologist:  Mandy More, MD   Referring MD: Mandy Mclean, MD  ASSESSMENT:    1. Coronary artery calcification seen on CT scan   2. Essential hypertension   3. Mixed hyperlipidemia due to type 2 diabetes mellitus ([place])   4. Chronic kidney disease, unspecified CKD stage    PLAN:    In order of problems listed above:  The clinical issue is how to apply the finding of coronary artery calcification on CT scan to this patient.  In view of her medical illnesses with vascular disease hyperlipidemia hypertension and age its not uncommon to see coronary calcification.  She is not having chest pain but she has marked exercise intolerance exertional shortness of breath and I think she requires an ischemia evaluation.  In addition to all of her problems her GFR is reduced stage IIIb and I think we should avoid contrast dye if possible and she will have a pharmacologic myocardial perfusion study.  If abnormal and high risk markers we need to seriously consider coronary angiography.  I will decide if she needs to come back to the office after seeing the results.  She is already taking antiplatelet therapy with clopidogrel with PAD and lipid-lowering treatment with nonstatin niacin. Stable continue her current medical therapy including ARB With her peripheral arterial disease I think she should be taking either statin or nonstatin equivalent Zetia bempedoic acid or PCSK9 inhibitor to optimize treatment.  She can discuss with her PCP  Next appointment as needed if she does not have ischemia or high risk markers I will see her back in my office as needed   Medication Adjustments/Labs and Tests Ordered: Current medicines are reviewed at length with the patient today.  Concerns regarding medicines are outlined above.  Orders Placed This Encounter  Procedures    MYOCARDIAL PERFUSION IMAGING   No orders of the defined types were placed in this encounter.    Chief complaint I have plaque in my heart arteries from CT scan  History of Present Illness:    Mandy Kim is a 76 y.o. female who is being seen today for the evaluation of coronary artery calcification on CT scan  at the request of Copland, Gay Filler, MD. Her history includes poorly controlled diabetes hepatic steatosis peripheral vascular disease with abdominal aortic aneurysm repair 2013 hyperlipidemia breast cancer bilateral cervical cancer and coronary artery calcification noted on chest CT. CT of chest 05/04/2022 showed atherotic calcification throughout the thoracic aorta and marked coronary artery calcification.  Also emphysema was noted She had a treadmill stress test performed February 2020 showing a normal EKG response limited exercise tolerance and hypertensive blood pressure response. EKG 05/04/2022 shows sinus rhythm RSR prime V1 otherwise normal EKG  She has no history of known heart disease Up until several years ago she was an active woman she walked 5 miles every day she used to treadmill but now she is limited by what sounds like pseudo claudication She also has emphysema and she is short of breath with activities like going to the store she wheezes at times She has no edema orthopnea shortness of breath in the home her ADLs no exertional chest pain palpitation or syncope She is concerned about plaque in her arteries She is statin intolerant and is maintained on niacin  Past Medical History:  Diagnosis Date  AAA (abdominal aortic aneurysm) ([place]) 02/12/2012   She is a pt of vascular surgery in [place]- she had a repair done in approx 2013.    Annual [place], stent has been in good position   Acute diarrhea 05/04/2022   Acute lower UTI 05/28/2017   Acute respiratory failure with hypoxia ([facility])    AKI (acute kidney injury) ([place]) 05/04/2022   Anticoagulant long-term use     plavix   Breast cancer ([place]) 02/12/2012   Double mastectomy, 2005.  Now cured   Cancer ([place])    CHF (congestive heart failure) ([place]) 2000   Chronic cystitis    Chronic kidney disease    Cystocele with prolapse 05/23/2019   Dark stools 05/04/2022   Dehydration 05/28/2017   Diverticulitis    Elevated LFTs 05/28/2017   Essential hypertension 05/04/2022   Fatty liver 02/12/2012   GERD (gastroesophageal reflux disease)    GERD without esophagitis 05/04/2022   Hiatal hernia    History of basal cell carcinoma excision    face/ nose right side   History of bilateral breast cancer followed by oncologist until 2008,  per pt no recurrence   right 2002  and left 2003 s/p  mastectomy with completed chemo and tamexifen therapy    History of cervical cancer 2000   s/p  TAH w/ BSO   History of diverticulitis of colon    Hyperlipidemia    Hypoxia 05/05/2022   Mixed hyperlipidemia due to type 2 diabetes mellitus ([place]) 05/04/2022   OSA on CPAP    PAD (peripheral artery disease) ([facility])    Peripheral neuropathy    PONV (postoperative nausea and vomiting)    Prolapse of female pelvic organs 05/24/2019   S/P AAA (abdominal aortic aneurysm) repair 09/15/2011   Sepsis ([place]) 05/28/2017   Stress incontinence due to pelvic organ prolapse    Type 2 diabetes mellitus treated with insulin ([place])    followed by pcp   Type 2 diabetes mellitus with diabetic polyneuropathy, with long-term current use of insulin ([place]) 02/12/2012   Wears glasses     Past Surgical History:  Procedure Laterality Date   ABDOMINAL HYSTERECTOMY  2000   w/  BSO,  and Bladder tacking abdominally   CARPAL TUNNEL RELEASE Left 1999   CATARACT EXTRACTION W/ INTRAOCULAR LENS  IMPLANT, BILATERAL  2015   ENDOVASCULAR REPAIR/STENT GRAFT  09-15-2011    _0    INCONTINENCE SURGERY  2007  approx.   IR ANGIO INTRA EXTRACRAN SEL INTERNAL CAROTID UNI L MOD SED  04/11/2022   IR ANGIO VERTEBRAL SEL VERTEBRAL UNI R MOD SED  04/11/2022   IR US GUIDE VASC ACCESS  RIGHT  04/11/2022   MASTECTOMY Bilateral right 2002;  left 2003   lymph node dissection done only on right side   ROBOTIC ASSISTED LAPAROSCOPIC SACROCOLPOPEXY N/A 05/23/2019   Procedure: XI ROBOTIC ASSISTED LAPAROSCOPIC SACROCOLPOPEXY;  Surgeon: Ardis Hughs, MD;  Location: [facility];  Service: Urology;  Laterality: N/A;   TONSILLECTOMY  child   TYMPANOPLASTY Right 1970s   "had my ear drum replacement with a plastic one"   WRIST GANGLION EXCISION Left 1998    Current Medications: Current Meds  Medication Sig   ACCU-CHEK AVIVA PLUS test strip TEST BLOOD SUGAR THREE TIMES DAILY   Accu-Chek Softclix Lancets lancets TEST BLOOD SUGAR THREE TIMES DAILY   Alcohol Swabs (DROPSAFE ALCOHOL PREP) 70 % PADS USE TO TEST BLOOD SUGAR 3 TIMES DAILY.   Alcohol Swabs PADS Use to test blood sugar 3 times  daily. Dx: E11.9   b complex vitamins tablet Take 0.5 tablets by mouth 2 (two) times daily.    Biotin 5 MG TABS Take 5,000 mcg by mouth at bedtime.    Blood Glucose Calibration (GLUCOSE CONTROL) SOLN Use to test blood sugar 3 times daily. Dx: E11.9   Blood Glucose Monitoring Suppl (ACCU-CHEK AVIVA PLUS) w/Device KIT USE AS DIRECTED  TO TEST BLOOD GLUCOSE BID E11.9   clopidogrel (PLAVIX) 75 MG tablet TAKE 1 TABLET EVERY DAY   Cranberry 500 MG TABS Take 500 mg by mouth daily.   famotidine (PEPCID) 20 MG tablet Take 1 tablet (20 mg total) by mouth daily.   gabapentin (NEURONTIN) 100 MG capsule TAKE 1 CAPSULE TWICE DAILY   gemfibrozil (LOPID) 600 MG tablet TAKE 1 TABLET TWICE DAILY BEFORE MEALS   glipiZIDE (GLUCOTROL) 5 MG tablet TAKE 1 TABLET TWICE DAILY BEFORE A MEAL   Insulin Pen Needle 32G X 4 MM MISC Used to inject insulin 1x daily.   Insulin Syringe-Needle U-100 (INSULIN SYRINGE 1CC/30GX5/16") 30G X 5/16" 1 ML MISC Use to inject insulin 1 time per day.   levothyroxine (SYNTHROID) 50 MCG tablet TAKE 1 TABLET EVERY DAY BEFORE BREAKFAST   losartan (COZAAR) 50 MG tablet Take 1 tablet (50 mg total) by  mouth daily.   magnesium oxide (MAG-OX) 400 MG tablet Take 200 mg by mouth daily.   Menthol, Topical Analgesic, (BIOFREEZE ROLL-ON EX) Apply 1 application topically as needed (joint pain).   metFORMIN (GLUCOPHAGE) 1000 MG tablet TAKE 1 TABLET TWICE DAILY WITH MEALS   Multiple Vitamin (MULTIVITAMIN) tablet Take 0.5 tablets by mouth 2 (two) times daily.    niacin (NIASPAN) 500 MG CR tablet Take 1 tablet (500 mg total) by mouth at bedtime.   Omega-3 Fatty Acids (FISH OIL) 1000 MG CAPS Take 1,000 mg by mouth 2 (two) times a day.    potassium chloride SA (KLOR-CON M) 20 MEQ tablet TAKE 1 TABLET EVERY DAY   Probiotic Product (PROBIOTIC DAILY PO) Take 1 capsule by mouth daily.   TRESIBA FLEXTOUCH 100 UNIT/ML FlexTouch Pen INJECT  60 UNITS EVERY DAY AT BEDTIME   VITAMIN D PO Take 1 capsule by mouth daily.   zinc gluconate 50 MG tablet Take 50 mg by mouth at bedtime.     Allergies:   Aspirin, Bee venom, Ibuprofen, Ozempic (0.25 or 0.5 mg-dose) [semaglutide(0.25 or 0.5mg -dos)], Poison oak extract, Statins, Trulicity [dulaglutide], Tylenol [acetaminophen], and Victoza [liraglutide]   Social History   Socioeconomic History   Marital status: Married    Spouse name: Not on file   Number of children: 4   Years of education: Not on file   Highest education level: Not on file  Occupational History   Not on file  Tobacco Use   Smoking status: Former    Years: 30.00    Types: Cigarettes    Quit date: 06/07/1999    Years since quitting: 22.9   Smokeless tobacco: Never  Vaping Use   Vaping Use: Never used  Substance and Sexual Activity   Alcohol use: Never   Drug use: Never   Sexual activity: Yes    Birth control/protection: Surgical  Other Topics Concern   Not on file  Social History Narrative   Lives with husband in [place].   Occupation: truck Geophysicist/field seismologist.   Social Determinants of Health   Financial Resource Strain: Not on file  Food Insecurity: Not on file  Transportation Needs: Not on  file  Physical Activity: Not on file  Stress: Not on file  Social Connections: Not on file     Family History: The patient's family history includes Cancer in her mother; Heart attack in her maternal grandfather; Hypertension in her daughter; Obesity in her daughter; Pneumonia in her maternal grandmother and mother; Thyroid disease in her mother, sister, and son. There is no history of Diabetes.  ROS:   ROS Please see the history of present illness.     All other systems reviewed and are negative.  EKGs/Labs/Other Studies Reviewed:    The following studies were reviewed today:   EKG:  EKG from 05/03/2022 independently reviewed sinus rhythm RSR prime V1 minor ST abnormality otherwise normal  Recent Labs: 02/20/2022: TSH 2.16 05/05/2022: ALT 40; Magnesium 2.3 05/15/2022: BUN 27; Creatinine, Ser 1.48; Hemoglobin 11.3; Platelets 383.0; Potassium 4.5; Sodium 140  Recent Lipid Panel    Component Value Date/Time   CHOL 207 (H) 02/20/2022 1607   TRIG (H) 02/20/2022 1607    440.0 Triglyceride is over 400; calculations on Lipids are invalid.   HDL 30.60 (L) 02/20/2022 1607   CHOLHDL 7 02/20/2022 1607   VLDL 53.0 (H) 09/06/2017 1352   LDLCALC 82 08/18/2015 1308   LDLDIRECT 96.0 02/20/2022 1607    Physical Exam:    VS:  BP (!) 148/72 (BP Location: Left Arm, Patient Position: Sitting)   Pulse 74   Ht _0  (1.626 m)   Wt 174 lb 1.3 oz (79 kg)   SpO2 94%   BMI 29.88 kg/m     Wt Readings from Last 3 Encounters:  05/18/22 174 lb 1.3 oz (79 kg)  05/15/22 173 lb 12.8 oz (78.8 kg)  04/11/22 180 lb (81.6 kg)     GEN: She looks somewhat frail well nourished, well developed in no acute distress HEENT: Normal NECK: No JVD; No carotid bruits LYMPHATICS: No lymphadenopathy CARDIAC: RRR, no murmurs, rubs, gallops RESPIRATORY: Diminished breath sounds diffusely and expiratory wheezing ABDOMEN: Soft, non-tender, non-distended MUSCULOSKELETAL:  No edema; No deformity  SKIN: Warm and  dry NEUROLOGIC:  Alert and oriented x 3 PSYCHIATRIC:  Normal affect     Signed, Mandy More, MD  05/18/2022 10:11 AM    [place]

## 2022-05-18 NOTE — Addendum Note (Signed)
Addended by: Lamar Blinks C on: 05/18/2022 07:30 PM   Modules accepted: Orders

## 2022-05-19 ENCOUNTER — Other Ambulatory Visit: Payer: Medicare HMO

## 2022-05-19 DIAGNOSIS — R300 Dysuria: Secondary | ICD-10-CM

## 2022-05-20 LAB — URINE CULTURE
MICRO NUMBER:: 13535692
SPECIMEN QUALITY:: ADEQUATE

## 2022-05-22 DIAGNOSIS — R8279 Other abnormal findings on microbiological examination of urine: Secondary | ICD-10-CM | POA: Diagnosis not present

## 2022-05-22 DIAGNOSIS — N3020 Other chronic cystitis without hematuria: Secondary | ICD-10-CM | POA: Diagnosis not present

## 2022-05-24 ENCOUNTER — Encounter (HOSPITAL_COMMUNITY): Payer: Self-pay | Admitting: *Deleted

## 2022-05-26 ENCOUNTER — Other Ambulatory Visit: Payer: Self-pay

## 2022-05-26 DIAGNOSIS — I2510 Atherosclerotic heart disease of native coronary artery without angina pectoris: Secondary | ICD-10-CM

## 2022-05-29 ENCOUNTER — Ambulatory Visit (HOSPITAL_COMMUNITY): Payer: Medicare HMO | Attending: Cardiology

## 2022-05-29 ENCOUNTER — Encounter (HOSPITAL_COMMUNITY): Payer: Self-pay | Admitting: *Deleted

## 2022-05-29 DIAGNOSIS — I2510 Atherosclerotic heart disease of native coronary artery without angina pectoris: Secondary | ICD-10-CM | POA: Diagnosis not present

## 2022-05-29 MED ORDER — TECHNETIUM TC 99M TETROFOSMIN IV KIT
9.1000 | PACK | Freq: Once | INTRAVENOUS | Status: AC | PRN
  Administered 2022-05-29: 9.1 via INTRAVENOUS

## 2022-05-30 ENCOUNTER — Ambulatory Visit (HOSPITAL_COMMUNITY): Payer: Medicare HMO | Attending: Internal Medicine

## 2022-05-30 DIAGNOSIS — I2510 Atherosclerotic heart disease of native coronary artery without angina pectoris: Secondary | ICD-10-CM | POA: Diagnosis not present

## 2022-05-30 LAB — MYOCARDIAL PERFUSION IMAGING
LV dias vol: 84 mL (ref 46–106)
LV sys vol: 33 mL
Nuc Stress EF: 61 %
Peak HR: 92 {beats}/min
Rest HR: 72 {beats}/min
Rest Nuclear Isotope Dose: 9.1 mCi
SDS: 1
SRS: 0
SSS: 1
ST Depression (mm): 0 mm
Stress Nuclear Isotope Dose: 30.2 mCi
TID: 0.98

## 2022-05-30 MED ORDER — REGADENOSON 0.4 MG/5ML IV SOLN
0.4000 mg | Freq: Once | INTRAVENOUS | Status: AC
  Administered 2022-05-30: 0.4 mg via INTRAVENOUS

## 2022-05-30 MED ORDER — TECHNETIUM TC 99M TETROFOSMIN IV KIT
30.2000 | PACK | Freq: Once | INTRAVENOUS | Status: AC | PRN
  Administered 2022-05-30: 30.2 via INTRAVENOUS

## 2022-05-31 DIAGNOSIS — Z961 Presence of intraocular lens: Secondary | ICD-10-CM | POA: Diagnosis not present

## 2022-05-31 DIAGNOSIS — H04123 Dry eye syndrome of bilateral lacrimal glands: Secondary | ICD-10-CM | POA: Diagnosis not present

## 2022-05-31 DIAGNOSIS — E119 Type 2 diabetes mellitus without complications: Secondary | ICD-10-CM | POA: Diagnosis not present

## 2022-05-31 DIAGNOSIS — H02831 Dermatochalasis of right upper eyelid: Secondary | ICD-10-CM | POA: Diagnosis not present

## 2022-05-31 DIAGNOSIS — H02834 Dermatochalasis of left upper eyelid: Secondary | ICD-10-CM | POA: Diagnosis not present

## 2022-05-31 LAB — HM DIABETES EYE EXAM

## 2022-06-12 ENCOUNTER — Telehealth: Payer: Self-pay | Admitting: Family Medicine

## 2022-06-12 ENCOUNTER — Other Ambulatory Visit: Payer: Self-pay

## 2022-06-12 ENCOUNTER — Telehealth: Payer: Self-pay | Admitting: Cardiology

## 2022-06-12 DIAGNOSIS — I10 Essential (primary) hypertension: Secondary | ICD-10-CM

## 2022-06-12 MED ORDER — LOSARTAN POTASSIUM 50 MG PO TABS: 50.0000 mg | ORAL_TABLET | Freq: Every day | ORAL | 0 refills | Status: DC

## 2022-06-12 NOTE — Telephone Encounter (Signed)
Pt returning nurses call regarding results. Please advise 

## 2022-06-12 NOTE — Telephone Encounter (Signed)
Medication: losartan (COZAAR) 50 MG tablet   Has the patient contacted their pharmacy? Yes Needs medicine sent to [facility] instead  Preferred Pharmacy (with phone number or street name):   [facility], [address]  [place], [place] [address]  Phone:  [phone]  Fax:  [phone]  Agent: Please be advised that RX refills may take up to 3 business days. We ask that you follow-up with your pharmacy.

## 2022-06-12 NOTE — Telephone Encounter (Signed)
Refill sent.

## 2022-06-12 NOTE — Telephone Encounter (Signed)
Patient informed of results.  

## 2022-06-12 NOTE — Telephone Encounter (Signed)
Patient returning call from the office to go over test results

## 2022-06-21 ENCOUNTER — Other Ambulatory Visit: Payer: Self-pay | Admitting: Family Medicine

## 2022-06-21 DIAGNOSIS — E118 Type 2 diabetes mellitus with unspecified complications: Secondary | ICD-10-CM

## 2022-06-27 DIAGNOSIS — N3020 Other chronic cystitis without hematuria: Secondary | ICD-10-CM | POA: Diagnosis not present

## 2022-06-27 DIAGNOSIS — R8271 Bacteriuria: Secondary | ICD-10-CM | POA: Diagnosis not present

## 2022-07-10 ENCOUNTER — Other Ambulatory Visit: Payer: Self-pay

## 2022-07-10 ENCOUNTER — Telehealth: Payer: Self-pay | Admitting: Family Medicine

## 2022-07-10 MED ORDER — FAMOTIDINE 20 MG PO TABS: 20.0000 mg | ORAL_TABLET | Freq: Every day | ORAL | 1 refills | Status: DC

## 2022-07-10 NOTE — Telephone Encounter (Signed)
Refill has been sent.  °

## 2022-07-10 NOTE — Telephone Encounter (Signed)
Medication:   famotidine (PEPCID) 20 MG tablet [[id number]]   Has the patient contacted their pharmacy? No. (If no, request that the patient contact the pharmacy for the refill.) (If yes, when and what did the pharmacy advise?)  Preferred Pharmacy (with phone number or street name):   [facility], [address]  [place], [place] [address]  Phone:  [phone]  Fax:  [phone]   Agent: Please be advised that RX refills may take up to 3 business days. We ask that you follow-up with your pharmacy.

## 2022-07-17 DIAGNOSIS — N3020 Other chronic cystitis without hematuria: Secondary | ICD-10-CM | POA: Diagnosis not present

## 2022-08-01 ENCOUNTER — Telehealth: Payer: Self-pay

## 2022-08-01 NOTE — Telephone Encounter (Signed)
Pt declined AWV. °

## 2022-08-11 DIAGNOSIS — N3020 Other chronic cystitis without hematuria: Secondary | ICD-10-CM | POA: Diagnosis not present

## 2022-08-13 ENCOUNTER — Other Ambulatory Visit: Payer: Self-pay | Admitting: Family Medicine

## 2022-08-13 DIAGNOSIS — E785 Hyperlipidemia, unspecified: Secondary | ICD-10-CM

## 2022-09-07 DIAGNOSIS — Z48812 Encounter for surgical aftercare following surgery on the circulatory system: Secondary | ICD-10-CM | POA: Diagnosis not present

## 2022-09-07 DIAGNOSIS — I7143 Infrarenal abdominal aortic aneurysm, without rupture: Secondary | ICD-10-CM | POA: Diagnosis not present

## 2022-09-13 DIAGNOSIS — I671 Cerebral aneurysm, nonruptured: Secondary | ICD-10-CM | POA: Diagnosis not present

## 2022-09-14 ENCOUNTER — Other Ambulatory Visit: Payer: Self-pay | Admitting: Neurosurgery

## 2022-09-14 ENCOUNTER — Other Ambulatory Visit (HOSPITAL_COMMUNITY): Payer: Self-pay | Admitting: Neurosurgery

## 2022-09-14 DIAGNOSIS — I671 Cerebral aneurysm, nonruptured: Secondary | ICD-10-CM

## 2022-09-16 ENCOUNTER — Other Ambulatory Visit: Payer: Self-pay

## 2022-09-16 ENCOUNTER — Emergency Department (HOSPITAL_COMMUNITY): Payer: Medicare HMO

## 2022-09-16 ENCOUNTER — Emergency Department (HOSPITAL_COMMUNITY)
Admission: EM | Admit: 2022-09-16 | Discharge: 2022-09-16 | Disposition: A | Discharge status: Home / Self Care | Payer: Medicare HMO | Attending: Emergency Medicine | Admitting: Emergency Medicine

## 2022-09-16 ENCOUNTER — Encounter (HOSPITAL_COMMUNITY): Payer: Self-pay | Admitting: Emergency Medicine

## 2022-09-16 ENCOUNTER — Ambulatory Visit
Admission: EM | Admit: 2022-09-16 | Discharge: 2022-09-16 | Payer: Medicare HMO | Attending: Family Medicine | Admitting: Family Medicine

## 2022-09-16 DIAGNOSIS — N189 Chronic kidney disease, unspecified: Secondary | ICD-10-CM | POA: Insufficient documentation

## 2022-09-16 DIAGNOSIS — E1122 Type 2 diabetes mellitus with diabetic chronic kidney disease: Secondary | ICD-10-CM | POA: Diagnosis not present

## 2022-09-16 DIAGNOSIS — R748 Abnormal levels of other serum enzymes: Secondary | ICD-10-CM | POA: Insufficient documentation

## 2022-09-16 DIAGNOSIS — Z79899 Other long term (current) drug therapy: Secondary | ICD-10-CM | POA: Diagnosis not present

## 2022-09-16 DIAGNOSIS — Z7984 Long term (current) use of oral hypoglycemic drugs: Secondary | ICD-10-CM | POA: Insufficient documentation

## 2022-09-16 DIAGNOSIS — I509 Heart failure, unspecified: Secondary | ICD-10-CM | POA: Insufficient documentation

## 2022-09-16 DIAGNOSIS — Z794 Long term (current) use of insulin: Secondary | ICD-10-CM | POA: Insufficient documentation

## 2022-09-16 DIAGNOSIS — R197 Diarrhea, unspecified: Secondary | ICD-10-CM | POA: Diagnosis not present

## 2022-09-16 DIAGNOSIS — A0472 Enterocolitis due to Clostridium difficile, not specified as recurrent: Secondary | ICD-10-CM | POA: Diagnosis not present

## 2022-09-16 DIAGNOSIS — Z853 Personal history of malignant neoplasm of breast: Secondary | ICD-10-CM | POA: Diagnosis not present

## 2022-09-16 DIAGNOSIS — R1110 Vomiting, unspecified: Secondary | ICD-10-CM

## 2022-09-16 DIAGNOSIS — K760 Fatty (change of) liver, not elsewhere classified: Secondary | ICD-10-CM | POA: Diagnosis not present

## 2022-09-16 DIAGNOSIS — I700 Atherosclerosis of aorta: Secondary | ICD-10-CM | POA: Diagnosis not present

## 2022-09-16 DIAGNOSIS — Z7902 Long term (current) use of antithrombotics/antiplatelets: Secondary | ICD-10-CM | POA: Insufficient documentation

## 2022-09-16 DIAGNOSIS — R7402 Elevation of levels of lactic acid dehydrogenase (LDH): Secondary | ICD-10-CM | POA: Diagnosis not present

## 2022-09-16 DIAGNOSIS — R109 Unspecified abdominal pain: Secondary | ICD-10-CM | POA: Diagnosis not present

## 2022-09-16 LAB — COMPREHENSIVE METABOLIC PANEL
ALT: 47 U/L — ABNORMAL HIGH (ref 0–44)
AST: 47 U/L — ABNORMAL HIGH (ref 15–41)
Albumin: 4.3 g/dL (ref 3.5–5.0)
Alkaline Phosphatase: 25 U/L — ABNORMAL LOW (ref 38–126)
Anion gap: 10 (ref 5–15)
BUN: 47 mg/dL — ABNORMAL HIGH (ref 8–23)
CO2: 17 mmol/L — ABNORMAL LOW (ref 22–32)
Calcium: 9.1 mg/dL (ref 8.9–10.3)
Chloride: 112 mmol/L — ABNORMAL HIGH (ref 98–111)
Creatinine, Ser: 1.5 mg/dL — ABNORMAL HIGH (ref 0.44–1.00)
GFR, Estimated: 36 mL/min — ABNORMAL LOW (ref >60)
Glucose, Bld: 135 mg/dL — ABNORMAL HIGH (ref 70–99)
Potassium: 3.6 mmol/L (ref 3.5–5.1)
Sodium: 139 mmol/L (ref 135–145)
Total Bilirubin: 0.7 mg/dL (ref 0.3–1.2)
Total Protein: 8 g/dL (ref 6.5–8.1)

## 2022-09-16 LAB — URINALYSIS, ROUTINE W REFLEX MICROSCOPIC
Bacteria, UA: NONE SEEN
Bilirubin Urine: NEGATIVE
Glucose, UA: 50 mg/dL — AB
Hgb urine dipstick: NEGATIVE
Ketones, ur: NEGATIVE mg/dL
Leukocytes,Ua: NEGATIVE
Nitrite: NEGATIVE
Protein, ur: 30 mg/dL — AB
Specific Gravity, Urine: 1.016 (ref 1.005–1.030)
pH: 5 (ref 5.0–8.0)

## 2022-09-16 LAB — CBC
HCT: 38.8 % (ref 36.0–46.0)
Hemoglobin: 12.7 g/dL (ref 12.0–15.0)
MCH: 29.2 pg (ref 26.0–34.0)
MCHC: 32.7 g/dL (ref 30.0–36.0)
MCV: 89.2 fL (ref 80.0–100.0)
Platelets: 289 10*3/uL (ref 150–400)
RBC: 4.35 MIL/uL (ref 3.87–5.11)
RDW: 15.5 % (ref 11.5–15.5)
WBC: 9.4 10*3/uL (ref 4.0–10.5)
nRBC: 0 % (ref 0.0–0.2)

## 2022-09-16 LAB — C DIFFICILE QUICK SCREEN W PCR REFLEX
C Diff antigen: POSITIVE — AB
C Diff toxin: NEGATIVE

## 2022-09-16 LAB — LIPASE, BLOOD: Lipase: 69 U/L — ABNORMAL HIGH (ref 11–51)

## 2022-09-16 LAB — CLOSTRIDIUM DIFFICILE BY PCR, REFLEXED: Toxigenic C. Difficile by PCR: POSITIVE — AB

## 2022-09-16 MED ORDER — SODIUM CHLORIDE 0.9 % IV BOLUS
1000.0000 mL | Freq: Once | INTRAVENOUS | Status: AC
  Administered 2022-09-16: 1000 mL via INTRAVENOUS

## 2022-09-16 MED ORDER — ONDANSETRON HCL 4 MG PO TABS: 4.0000 mg | ORAL_TABLET | Freq: Three times a day (TID) | ORAL | 0 refills | Status: AC | PRN

## 2022-09-16 MED ORDER — SODIUM CHLORIDE 0.9 % IV BOLUS: 1000.0000 mL | Freq: Once | INTRAVENOUS | Status: DC

## 2022-09-16 MED ORDER — VANCOMYCIN HCL 125 MG PO CAPS: 125.0000 mg | ORAL_CAPSULE | Freq: Four times a day (QID) | ORAL | 0 refills | Status: AC

## 2022-09-16 MED ORDER — IOHEXOL 350 MG/ML SOLN
75.0000 mL | Freq: Once | INTRAVENOUS | Status: AC | PRN
  Administered 2022-09-16: 75 mL via INTRAVENOUS

## 2022-09-16 NOTE — ED Notes (Signed)
Patient transported to CT 

## 2022-09-16 NOTE — ED Provider Notes (Signed)
[facility] EMERGENCY DEPARTMENT Provider Note   CSN: [id number] Arrival date & time: 09/16/22  1414     History  Chief Complaint  Patient presents with   Emesis   Diarrhea    Mandy Kim is a 76 y.o. female.  On exam this is an obese female resting comfortably in bed appears to be in no acute discomfort.  Abdomen is soft and mildly tender throughout with hyperactive bowel sounds.  Heart and lung sounds normal.  Lungs clear.  The history is provided by the patient, the spouse and medical records. No language interpreter was used.  Emesis Associated symptoms: diarrhea   Diarrhea Associated symptoms: vomiting      76 year old female significant history of AAA, diabetes, breast cancer, CKD, CHF, GERD sent here from urgent care center with complaints of abdominal pain.  Patient report for the past 4 days she has had abdominal cramping as well as having nausea and diarrhea.  States she has multiple episodes of watery and mucousy diarrhea upwards to 20 times a day.  She does endorse nausea with 1 episode of vomiting a few days ago.  She does not endorse any fever or chills no chest pain or shortness of breath no runny nose sneezing or coughing no dysuria.  She admits to having C. difficile in the past last treatment was a month ago.  She denies any recent antibiotic use.  She initially was seen at urgent care for her symptoms but was sent here for further care.  She does feel dehydrated.  She denies any blood in the stool.  Home Medications Prior to Admission medications   Medication Sig Start Date End Date Taking? Authorizing Provider  ACCU-CHEK AVIVA PLUS test strip TEST BLOOD SUGAR THREE TIMES DAILY 05/15/22   Copland, Gay Filler, MD  Accu-Chek Softclix Lancets lancets TEST BLOOD SUGAR THREE TIMES DAILY 07/20/21   Copland, Gay Filler, MD  Alcohol Swabs (DROPSAFE ALCOHOL PREP) 70 % PADS USE TO TEST BLOOD SUGAR 3 TIMES DAILY. 07/20/21   Copland, Gay Filler, MD  Alcohol  Swabs PADS Use to test blood sugar 3 times daily. Dx: E11.9 01/14/19   Copland, Gay Filler, MD  b complex vitamins tablet Take 0.5 tablets by mouth 2 (two) times daily.     [provider]  Biotin 5 MG TABS Take 5,000 mcg by mouth at bedtime.     [provider]  Blood Glucose Calibration (GLUCOSE CONTROL) SOLN Use to test blood sugar 3 times daily. Dx: E11.9 12/20/15   Copland, Gay Filler, MD  Blood Glucose Monitoring Suppl (ACCU-CHEK AVIVA PLUS) w/Device KIT USE AS DIRECTED  TO TEST BLOOD GLUCOSE BID E11.9 05/11/21   Copland, Gay Filler, MD  clopidogrel (PLAVIX) 75 MG tablet TAKE 1 TABLET EVERY DAY 02/10/22   Copland, Gay Filler, MD  Cranberry 500 MG TABS Take 500 mg by mouth daily.    [provider]  famotidine (PEPCID) 20 MG tablet Take 1 tablet (20 mg total) by mouth daily. 07/10/22   Copland, Gay Filler, MD  gabapentin (NEURONTIN) 100 MG capsule TAKE 1 CAPSULE TWICE DAILY 05/15/22   Copland, Gay Filler, MD  gemfibrozil (LOPID) 600 MG tablet Take 1 tablet (600 mg total) by mouth 2 (two) times daily before a meal. 08/14/22   Copland, Gay Filler, MD  glipiZIDE (GLUCOTROL) 5 MG tablet TAKE 1 TABLET TWICE DAILY BEFORE MEALS 06/21/22   Copland, Gay Filler, MD  Insulin Pen Needle 32G X 4 MM MISC Used to  inject insulin 1x daily. 10/18/17   Renato Shin, MD  Insulin Syringe-Needle U-100 (INSULIN SYRINGE 1CC/30GX5/16") 30G X 5/16" 1 ML MISC Use to inject insulin 1 time per day. 05/22/17   Renato Shin, MD  levothyroxine (SYNTHROID) 50 MCG tablet TAKE 1 TABLET EVERY DAY BEFORE BREAKFAST 05/15/22   Copland, Gay Filler, MD  losartan (COZAAR) 50 MG tablet Take 1 tablet (50 mg total) by mouth daily. 06/12/22   Copland, Gay Filler, MD  magnesium oxide (MAG-OX) 400 MG tablet Take 200 mg by mouth daily.    [provider]  Menthol, Topical Analgesic, (BIOFREEZE ROLL-ON EX) Apply 1 application topically as needed (joint pain).    [provider]  metFORMIN (GLUCOPHAGE) 1000 MG tablet  TAKE 1 TABLET TWICE DAILY WITH MEALS 03/13/22   Copland, Gay Filler, MD  Multiple Vitamin (MULTIVITAMIN) tablet Take 0.5 tablets by mouth 2 (two) times daily.     [provider]  niacin (NIASPAN) 500 MG CR tablet Take 1 tablet (500 mg total) by mouth at bedtime. 08/25/15   Copland, Gay Filler, MD  Omega-3 Fatty Acids (FISH OIL) 1000 MG CAPS Take 1,000 mg by mouth 2 (two) times a day.     [provider]  potassium chloride SA (KLOR-CON M) 20 MEQ tablet TAKE 1 TABLET EVERY DAY 02/07/22   Copland, Gay Filler, MD  Probiotic Product (PROBIOTIC DAILY PO) Take 1 capsule by mouth daily.    [provider]  TRESIBA FLEXTOUCH 100 UNIT/ML FlexTouch Pen INJECT  60 UNITS EVERY DAY AT BEDTIME 11/14/21   Copland, Gay Filler, MD  VITAMIN D PO Take 1 capsule by mouth daily.    [provider]  zinc gluconate 50 MG tablet Take 50 mg by mouth at bedtime.    [provider]      Allergies    Aspirin, Bee venom, Ibuprofen, Ozempic (0.25 or 0.5 mg-dose) [semaglutide(0.25 or 0.5mg -dos)], Poison oak extract, Statins, Trulicity [dulaglutide], Tylenol [acetaminophen], and Victoza [liraglutide]    Review of Systems   Review of Systems  Gastrointestinal:  Positive for diarrhea and vomiting.  All other systems reviewed and are negative.   Physical Exam Updated Vital Signs BP (!) 127/57   Pulse 66   Temp 97.6 F (36.4 C)   Resp 18   SpO2 94%  Physical Exam Vitals and nursing note reviewed.  Constitutional:      General: She is not in acute distress.    Appearance: She is well-developed.  HENT:     Head: Atraumatic.     Mouth/Throat:     Mouth: Mucous membranes are dry.  Eyes:     Conjunctiva/sclera: Conjunctivae normal.  Cardiovascular:     Rate and Rhythm: Normal rate and regular rhythm.  Pulmonary:     Effort: Pulmonary effort is normal.  Abdominal:     Palpations: Abdomen is soft.     Tenderness: There is abdominal tenderness (Mild diffuse tenderness without  guarding or rebound tenderness.  Abdomen otherwise soft, bowel sounds hyperactive).  Musculoskeletal:     Cervical back: Neck supple.  Skin:    Findings: No rash.  Neurological:     Mental Status: She is alert and oriented to person, place, and time.  Psychiatric:        Mood and Affect: Mood normal.     ED Results / Procedures / Treatments   Labs (all labs ordered are listed, but only abnormal results are displayed) Labs Reviewed  LIPASE, BLOOD - Abnormal; Notable for the following  components:      Result Value   Lipase 69 (*)    All other components within normal limits  COMPREHENSIVE METABOLIC PANEL - Abnormal; Notable for the following components:   Chloride 112 (*)    CO2 17 (*)    Glucose, Bld 135 (*)    BUN 47 (*)    Creatinine, Ser 1.50 (*)    AST 47 (*)    ALT 47 (*)    Alkaline Phosphatase 25 (*)    GFR, Estimated 36 (*)    All other components within normal limits  URINALYSIS, ROUTINE W REFLEX MICROSCOPIC - Abnormal; Notable for the following components:   APPearance HAZY (*)    Glucose, UA 50 (*)    Protein, ur 30 (*)    All other components within normal limits  C DIFFICILE QUICK SCREEN W PCR REFLEX    CBC    EKG None  Radiology CT ABDOMEN PELVIS W CONTRAST  Result Date: 09/16/2022 CLINICAL DATA:  Abdominal pain, nausea, vomiting, and diarrhea for 4 days. EXAM: CT ABDOMEN AND PELVIS WITH CONTRAST TECHNIQUE: Multidetector CT imaging of the abdomen and pelvis was performed using the standard protocol following bolus administration of intravenous contrast. RADIATION DOSE REDUCTION: This exam was performed according to the departmental dose-optimization program which includes automated exposure control, adjustment of the mA and/or kV according to patient size and/or use of iterative reconstruction technique. CONTRAST:  64m OMNIPAQUE IOHEXOL 350 MG/ML SOLN COMPARISON:  05/04/2022. FINDINGS: Lower chest: The heart is enlarged and there is no pericardial  effusion. Scattered coronary artery calcifications are noted. Atelectasis is present at the lung bases. Hepatobiliary: No focal liver abnormality is seen. There is fatty infiltration of the liver. No gallstones, gallbladder wall thickening, or biliary dilatation. Pancreas: Unremarkable. No pancreatic ductal dilatation or surrounding inflammatory changes. Spleen: Normal in size without focal abnormality. Adrenals/Urinary Tract: The adrenal glands are within normal limits. The kidneys enhance symmetrically. No renal or ureteral calculus or obstructive uropathy bilaterally. Stomach/Bowel: Stomach is within normal limits. Appendix appears normal. No evidence of bowel wall thickening, distention, or inflammatory changes. No free air or pneumatosis. Scattered diverticula are present along the colon without evidence of diverticulitis. Vascular/Lymphatic: Aortic atherosclerosis with prior stent graft and common iliac artery stents. Aneurysm sac is stable. No abdominal or pelvic lymphadenopathy. Reproductive: Status post hysterectomy. No adnexal masses. Other: No abdominopelvic ascites. Musculoskeletal: Degenerative changes in the thoracolumbar spine. No acute osseous abnormality. IMPRESSION: 1. No acute intra-abdominal process. 2. Hepatic steatosis. 3. Diverticulosis without diverticulitis. 4. Cardiomegaly with coronary artery calcifications. 5. Aortic atherosclerosis with prior stent graft repair of abdominal aortic aneurysm. Electronically Signed   By: LBrett FairyM.D.   On: 09/16/2022 20:12    Procedures Procedures    Medications Ordered in ED Medications  sodium chloride 0.9 % bolus 1,000 mL (1,000 mLs Intravenous New Bag/Given 09/16/22 1927)  iohexol (OMNIPAQUE) 350 MG/ML injection 75 mL (75 mLs Intravenous Contrast Given 09/16/22 1950)    ED Course/ Medical Decision Making/ A&P                           Medical Decision Making Amount and/or Complexity of Data Reviewed Labs: ordered. Radiology:  ordered.  Risk Prescription drug management.   BP (!) 127/57   Pulse 66   Temp 97.6 F (36.4 C)   Resp 18   SpO2 94%   [id number] 76year old female significant history of AAA, diabetes, breast cancer, CKD, CHF,  GERD sent here from urgent care center with complaints of abdominal pain.  Patient report for the past 4 days she has had abdominal cramping as well as having nausea and diarrhea.  States she has multiple episodes of watery and mucousy diarrhea upwards to 20 times a day.  She does endorse nausea with 1 episode of vomiting a few days ago.  She does not endorse any fever or chills no chest pain or shortness of breath no runny nose sneezing or coughing no dysuria.  She admits to having C. difficile in the past last treatment was a month ago.  She denies any recent antibiotic use.  She initially was seen at urgent care for her symptoms but was sent here for further care.  She does feel dehydrated.  She denies any blood in the stool.  On exam this is an elderly female resting comfortably in bed appears to be in no acute acute discomfort.  Heart lung sounds normal.  Abdomen is soft and mildly tender without focal point tenderness.  Bowel sounds hyperactive.  Vital signs is reassuring, she is afebrile, normal blood pressure, no hypoxia, normal heart rate and normal respiratory rate.  Labs obtained independently viewed interpreted by me.  Urinalysis without signs of urinary tract infection.  Minimally elevated lipase of 69.  Mild AKI with creatinine of 1.5 and a GFR of 36.  Minimally elevated transaminitis with AST 47, ALT 47, alk phos 25.  Normal WBC, normal H&H.  Given history of C. difficile in the past and having persistent diarrhea and concerns of C. difficile related diarrhea causing her symptoms.  Due to her age, will obtain abdomen and pelvis CT scan for further assessment.  IV fluid given.  8:54 PM CT scan of the abdomen pelvis was independently visualized and interpreted by me and I  agree with radiology interpretation.  Fortunately no acute intra-abdominal process was noted.  C. difficile culture has been sent but have not resulted yet.  On reassessment patient is resting comfortably appears to be in no acute discomfort.  There was several episodes of hypoxia documented on the monitor but currently her O2 sat is 94% on room air.  Patient states it is not unusual for her to have occasional low oxygen sats because sometimes she forgets to breathe.  She denies any worsening shortness of breath.  She is currently receiving IV fluid.  9:24 PM Her C. difficile test came back positive.  This finding is consistent with patient presentation.  We will treat with oral vancomycin 125 mg 4 times daily for the next 10 days.  Recommend outpatient follow-up as well.  I gave patient strict return precaution.  I have considered a hospital admission but in the setting of reassuring labs, patient tolerates p.o., and reassuring CT scan I felt patient stable to be discharged home.  This patient presents to the ED for concern of diarrhea, this involves an extensive number of treatment options, and is a complaint that carries with it a high risk of complications and morbidity.  The differential diagnosis includes C.diff, malabsorbtion, drug induced diarrhea, IBS, anxiety, infectious diarrhea  Co morbidities that complicate the patient evaluation hx of C.diff  DM  CKD  Additional history obtained:  Additional history obtained from husband External records from outside source obtained and reviewed including EMR  Lab Tests:  I Ordered, and personally interpreted labs.  The pertinent results include:  as above  Imaging Studies ordered:  I ordered imaging studies including abd/pelvis CT I independently  visualized and interpreted imaging which showed no acute finding I agree with the radiologist interpretation  Cardiac Monitoring:  The patient was maintained on a cardiac monitor.  I personally  viewed and interpreted the cardiac monitored which showed an underlying rhythm of: NSR  Medicines ordered and prescription drug management:  I ordered medication including IVF  for dehydration Reevaluation of the patient after these medicines showed that the patient improved I have reviewed the patients home medicines and have made adjustments as needed  Test Considered: as above  Critical Interventions: IVF  Consultations Obtained:  I requested consultation with the attending Dr. Pearline Cables,  and discussed lab and imaging findings as well as pertinent plan - they recommend: outpt fu  Problem List / ED Course: diarrhea  Abd pain  Reevaluation:  After the interventions noted above, I reevaluated the patient and found that they have :improved  Social Determinants of Health: tobacco use  Dispostion:  After consideration of the diagnostic results and the patients response to treatment, I feel that the patent would benefit from outpt f/u.           Final Clinical Impression(s) / ED Diagnoses Final diagnoses:  C. difficile diarrhea    Rx / DC Orders ED Discharge Orders          Ordered    vancomycin (VANCOCIN) 125 MG capsule  4 times daily        09/16/22 2107    ondansetron (ZOFRAN) 4 MG tablet  Every 8 hours PRN        09/16/22 2108              Domenic Moras, PA-C 09/16/22 2128    Jeanell Sparrow, DO 09/17/22 2209

## 2022-09-16 NOTE — ED Triage Notes (Signed)
Pt presents with abdominal discomfort with some vomiting & diarrhea X 4 days.

## 2022-09-16 NOTE — Discharge Instructions (Addendum)
He is proceed to the emergency room for a higher level of evaluation and care than we can provide here in the urgent care setting

## 2022-09-16 NOTE — ED Triage Notes (Signed)
Patient from home presenting after being evaluated at the [facility] for nause/ vomiting/ diarrhea for 4 days. Patient endorses generalized abdominal pain, stating her stomach feels like "a rock". Denies chest pain, SHOB. Throwing up bile. Aox4.

## 2022-09-16 NOTE — ED Provider Notes (Signed)
[facility]    CSN: [id number] Arrival date & time: 09/16/22  1137      History   Chief Complaint Chief Complaint  Patient presents with   Diarrhea   Emesis    HPI Mandy Kim is a 76 y.o. female.    Diarrhea Associated symptoms: vomiting   Emesis Associated symptoms: diarrhea    Here for diarrhea and nausea that has been going on for 4-5 days. The diarrhea has been occurring numerous times daily, "too many to count". She has thrown up once this AM.  No f/c. No URI symptoms  Her abd has felt distended.  RA O2 sat was 88-89 %. She states it is always low; "I don't breathe like other people)  Past Medical History:  Diagnosis Date   AAA (abdominal aortic aneurysm) ([place]) 02/12/2012   She is a pt of vascular surgery in [place]- she had a repair done in approx 2013.    Annual [place], stent has been in good position   Acute diarrhea 05/04/2022   Acute lower UTI 05/28/2017   Acute respiratory failure with hypoxia (HCC)    AKI (acute kidney injury) ([place]) 05/04/2022   Anticoagulant long-term use    plavix   Breast cancer ([place]) 02/12/2012   Double mastectomy, 2005.  Now cured   Cancer ([place])    CHF (congestive heart failure) ([place]) 2000   Chronic cystitis    Chronic kidney disease    Cystocele with prolapse 05/23/2019   Dark stools 05/04/2022   Dehydration 05/28/2017   Diverticulitis    Elevated LFTs 05/28/2017   Essential hypertension 05/04/2022   Fatty liver 02/12/2012   GERD (gastroesophageal reflux disease)    GERD without esophagitis 05/04/2022   Hiatal hernia    History of basal cell carcinoma excision    face/ nose right side   History of bilateral breast cancer followed by oncologist until 2008,  per pt no recurrence   right 2002  and left 2003 s/p  mastectomy with completed chemo and tamexifen therapy    History of cervical cancer 2000   s/p  TAH w/ BSO   History of diverticulitis of colon    Hyperlipidemia    Hypoxia 05/05/2022   Mixed hyperlipidemia  due to type 2 diabetes mellitus ([place]) 05/04/2022   OSA on CPAP    PAD (peripheral artery disease) (HCC)    Peripheral neuropathy    PONV (postoperative nausea and vomiting)    Prolapse of female pelvic organs 05/24/2019   S/P AAA (abdominal aortic aneurysm) repair 09/15/2011   Sepsis ([place]) 05/28/2017   Stress incontinence due to pelvic organ prolapse    Type 2 diabetes mellitus treated with insulin ([place])    followed by pcp   Type 2 diabetes mellitus with diabetic polyneuropathy, with long-term current use of insulin ([place]) 02/12/2012   Wears glasses     Patient Active Problem List   Diagnosis Date Noted   BCC (basal cell carcinoma), face 05/18/2022   Carpal tunnel syndrome, bilateral 05/18/2022   Colon polyp 05/18/2022   Herniated intervertebral disk 05/18/2022   Hypokalemia 05/18/2022   Intermittent claudication ([place]) 05/18/2022   Intracranial aneurysm 05/18/2022   Wears glasses 05/16/2022   Stress incontinence due to pelvic organ prolapse 05/16/2022   PONV (postoperative nausea and vomiting) 05/16/2022   OSA on CPAP 05/16/2022   History of diverticulitis of colon 05/16/2022   History of bilateral breast cancer 05/16/2022   History of basal cell carcinoma excision 05/16/2022   Hiatal  hernia 05/16/2022   Diverticulitis 05/16/2022   CKD (chronic kidney disease) 05/16/2022   Chronic cystitis 05/16/2022   Cancer ([place]) 05/16/2022   Anticoagulant long-term use 05/16/2022   Hypoxia 05/05/2022   Acute diarrhea 05/04/2022   Mixed hyperlipidemia due to type 2 diabetes mellitus ([place]) 05/04/2022   Essential hypertension 05/04/2022   GERD without esophagitis 05/04/2022   AKI (acute kidney injury) ([place]) 05/04/2022   Hypothyroidism 05/04/2022   Dark stools 05/04/2022   Myositis, unspecified 04/27/2020   Prolapse of female pelvic organs 05/24/2019   Cystocele with prolapse 05/23/2019   Acute respiratory failure with hypoxia (HCC)    Sepsis ([place]) 05/28/2017   Elevated LFTs 05/28/2017    Acute lower UTI 05/28/2017   Dehydration 05/28/2017   Peripheral neuropathy 07/27/2014   Rectocele 04/02/2013   Urge incontinence 04/02/2013   Urinary tract infection, site not specified 04/02/2013   Urinary urgency 04/02/2013   AAA (abdominal aortic aneurysm) ([place]) 02/12/2012   Fatty liver 02/12/2012   Breast cancer ([place]) 02/12/2012   Cervical cancer ([place]) 02/12/2012   Type 2 diabetes mellitus with diabetic polyneuropathy, with long-term current use of insulin ([place]) 02/12/2012   Hyperlipidemia 02/12/2012   S/P AAA (abdominal aortic aneurysm) repair 09/15/2011   Impaired glucose tolerance 06/26/2011   History of cervical cancer 2000   CHF (congestive heart failure) ([place]) 2000    Past Surgical History:  Procedure Laterality Date   ABDOMINAL HYSTERECTOMY  2000   w/  BSO,  and Bladder tacking abdominally   CARPAL TUNNEL RELEASE Left 1999   CATARACT EXTRACTION W/ INTRAOCULAR LENS  IMPLANT, BILATERAL  2015   ENDOVASCULAR REPAIR/STENT GRAFT  09-15-2011    '@[facility]'$    INCONTINENCE SURGERY  2007  approx.   IR ANGIO INTRA EXTRACRAN SEL INTERNAL CAROTID UNI L MOD SED  04/11/2022   IR ANGIO VERTEBRAL SEL VERTEBRAL UNI R MOD SED  04/11/2022   IR US GUIDE VASC ACCESS RIGHT  04/11/2022   MASTECTOMY Bilateral right 2002;  left 2003   lymph node dissection done only on right side   ROBOTIC ASSISTED LAPAROSCOPIC SACROCOLPOPEXY N/A 05/23/2019   Procedure: XI ROBOTIC ASSISTED LAPAROSCOPIC SACROCOLPOPEXY;  Surgeon: Ardis Hughs, MD;  Location: [facility];  Service: Urology;  Laterality: N/A;   TONSILLECTOMY  child   TYMPANOPLASTY Right 1970s   "had my ear drum replacement with a plastic one"   WRIST GANGLION EXCISION Left 1998    OB History   No obstetric history on file.      Home Medications    Prior to Admission medications   Medication Sig Start Date End Date Taking? Authorizing Provider  ACCU-CHEK AVIVA PLUS test strip TEST BLOOD SUGAR THREE TIMES DAILY 05/15/22   Copland, Gay Filler, MD   Accu-Chek Softclix Lancets lancets TEST BLOOD SUGAR THREE TIMES DAILY 07/20/21   Copland, Gay Filler, MD  Alcohol Swabs (DROPSAFE ALCOHOL PREP) 70 % PADS USE TO TEST BLOOD SUGAR 3 TIMES DAILY. 07/20/21   Copland, Gay Filler, MD  Alcohol Swabs PADS Use to test blood sugar 3 times daily. Dx: E11.9 01/14/19   Copland, Gay Filler, MD  b complex vitamins tablet Take 0.5 tablets by mouth 2 (two) times daily.     [provider]  Biotin 5 MG TABS Take 5,000 mcg by mouth at bedtime.     [provider]  Blood Glucose Calibration (GLUCOSE CONTROL) SOLN Use to test blood sugar 3 times daily. Dx: E11.9 12/20/15   Copland, Gay Filler, MD  Blood Glucose Monitoring  Suppl (ACCU-CHEK AVIVA PLUS) w/Device KIT USE AS DIRECTED  TO TEST BLOOD GLUCOSE BID E11.9 05/11/21   Copland, Gay Filler, MD  clopidogrel (PLAVIX) 75 MG tablet TAKE 1 TABLET EVERY DAY 02/10/22   Copland, Gay Filler, MD  Cranberry 500 MG TABS Take 500 mg by mouth daily.    [provider]  famotidine (PEPCID) 20 MG tablet Take 1 tablet (20 mg total) by mouth daily. 07/10/22   Copland, Gay Filler, MD  gabapentin (NEURONTIN) 100 MG capsule TAKE 1 CAPSULE TWICE DAILY 05/15/22   Copland, Gay Filler, MD  gemfibrozil (LOPID) 600 MG tablet Take 1 tablet (600 mg total) by mouth 2 (two) times daily before a meal. 08/14/22   Copland, Gay Filler, MD  glipiZIDE (GLUCOTROL) 5 MG tablet TAKE 1 TABLET TWICE DAILY BEFORE MEALS 06/21/22   Copland, Gay Filler, MD  Insulin Pen Needle 32G X 4 MM MISC Used to inject insulin 1x daily. 10/18/17   Renato Shin, MD  Insulin Syringe-Needle U-100 (INSULIN SYRINGE 1CC/30GX5/16") 30G X 5/16" 1 ML MISC Use to inject insulin 1 time per day. 05/22/17   Renato Shin, MD  levothyroxine (SYNTHROID) 50 MCG tablet TAKE 1 TABLET EVERY DAY BEFORE BREAKFAST 05/15/22   Copland, Gay Filler, MD  losartan (COZAAR) 50 MG tablet Take 1 tablet (50 mg total) by mouth daily. 06/12/22   Copland, Gay Filler, MD  magnesium oxide (MAG-OX) 400 MG  tablet Take 200 mg by mouth daily.    [provider]  Menthol, Topical Analgesic, (BIOFREEZE ROLL-ON EX) Apply 1 application topically as needed (joint pain).    [provider]  metFORMIN (GLUCOPHAGE) 1000 MG tablet TAKE 1 TABLET TWICE DAILY WITH MEALS 03/13/22   Copland, Gay Filler, MD  Multiple Vitamin (MULTIVITAMIN) tablet Take 0.5 tablets by mouth 2 (two) times daily.     [provider]  niacin (NIASPAN) 500 MG CR tablet Take 1 tablet (500 mg total) by mouth at bedtime. 08/25/15   Copland, Gay Filler, MD  Omega-3 Fatty Acids (FISH OIL) 1000 MG CAPS Take 1,000 mg by mouth 2 (two) times a day.     [provider]  potassium chloride SA (KLOR-CON M) 20 MEQ tablet TAKE 1 TABLET EVERY DAY 02/07/22   Copland, Gay Filler, MD  Probiotic Product (PROBIOTIC DAILY PO) Take 1 capsule by mouth daily.    [provider]  TRESIBA FLEXTOUCH 100 UNIT/ML FlexTouch Pen INJECT  60 UNITS EVERY DAY AT BEDTIME 11/14/21   Copland, Gay Filler, MD  VITAMIN D PO Take 1 capsule by mouth daily.    [provider]  zinc gluconate 50 MG tablet Take 50 mg by mouth at bedtime.    [provider]    Family History Family History  Problem Relation Age of Onset   Cancer Mother    Pneumonia Mother    Thyroid disease Mother    Thyroid disease Sister    Obesity Daughter    Hypertension Daughter    Thyroid disease Son    Pneumonia Maternal Grandmother    Heart attack Maternal Grandfather    Diabetes Neg Hx     Social History Social History   Tobacco Use   Smoking status: Former    Years: 30.00    Types: Cigarettes    Quit date: 06/07/1999    Years since quitting: 23.2   Smokeless tobacco: Never  Vaping Use   Vaping Use: Never used  Substance Use Topics   Alcohol use: Never   Drug use:  Never     Allergies   Aspirin, Bee venom, Ibuprofen, Ozempic (0.25 or 0.5 mg-dose) [semaglutide(0.25 or 0.5mg -dos)], Poison oak extract, Statins, Trulicity  [dulaglutide], Tylenol [acetaminophen], and Victoza [liraglutide]   Review of Systems Review of Systems  Gastrointestinal:  Positive for diarrhea and vomiting.     Physical Exam Triage Vital Signs ED Triage Vitals  Enc Vitals Group     BP 09/16/22 1252 132/65     Pulse Rate 09/16/22 1252 73     Resp 09/16/22 1252 16     Temp 09/16/22 1252 98.1 F (36.7 C)     Temp Source 09/16/22 1252 Oral     SpO2 09/16/22 1252 (!) 88 %     Weight --      Height --      Head Circumference --      Peak Flow --      Pain Score 09/16/22 1251 5     Pain Loc --      Pain Edu? --      Excl. in [place]? --    No data found.  Updated Vital Signs BP 132/65 (BP Location: Left Arm)   Pulse 73   Temp 98.1 F (36.7 C) (Oral)   Resp 16   SpO2 (!) 88%   Visual Acuity Right Eye Distance:   Left Eye Distance:   Bilateral Distance:    Right Eye Near:   Left Eye Near:    Bilateral Near:     Physical Exam Vitals reviewed.  Constitutional:      Comments: Chronically ill appearing. Alert and answers questions, though somewhat reluctantly  HENT:     Mouth/Throat:     Mouth: Mucous membranes are moist.  Eyes:     Extraocular Movements: Extraocular movements intact.     Pupils: Pupils are equal, round, and reactive to light.  Cardiovascular:     Rate and Rhythm: Normal rate and regular rhythm.  Pulmonary:     Effort: Pulmonary effort is normal.     Breath sounds: Normal breath sounds.  Abdominal:     Palpations: Abdomen is soft.     Comments: Abd protuberant, hyperactive BS.   Musculoskeletal:     Cervical back: Neck supple.  Lymphadenopathy:     Cervical: No cervical adenopathy.  Skin:    Coloration: Skin is pale.  Neurological:     General: No focal deficit present.     Mental Status: She is alert and oriented to person, place, and time.      UC Treatments / Results  Labs (all labs ordered are listed, but only abnormal results are displayed) Labs Reviewed - No data to  display  EKG   Radiology No results found.  Procedures Procedures (including critical care time)  Medications Ordered in UC Medications - No data to display  Initial Impression / Assessment and Plan / UC Course  I have reviewed the triage vital signs and the nursing notes.  Pertinent labs & imaging results that were available during my care of the patient were reviewed by me and considered in my medical decision making (see chart for details).        Concerned that she needs urgent lab evaluation at least.  I have asked her to proceed to the emergency room for higher level of care and evaluation then we can provide here in the urgent care setting.  She will go with her husband in their private car. Final Clinical Impressions(s) / UC Diagnoses   Final diagnoses:  Vomiting and diarrhea     Discharge Instructions      He is proceed to the emergency room for a higher level of evaluation and care than we can provide here in the urgent care setting    ED Prescriptions   None    PDMP not reviewed this encounter.   Barrett Henle, MD 09/16/22 1325

## 2022-09-16 NOTE — Discharge Instructions (Addendum)
You have been evaluated for your symptoms.  Your diarrhea is due to C. difficile infection.  Please take antibiotic prescribed as treatment for your C. difficile.  Stay hydrated.  You may follow-up with your doctor for further care.  Return to the ER if your symptoms worsen or if you have other concern.  You may take Zofran as needed for nausea.

## 2022-09-17 ENCOUNTER — Other Ambulatory Visit: Payer: Self-pay | Admitting: Family Medicine

## 2022-09-17 DIAGNOSIS — Z9889 Other specified postprocedural states: Secondary | ICD-10-CM

## 2022-09-19 ENCOUNTER — Other Ambulatory Visit: Payer: Self-pay | Admitting: Family Medicine

## 2022-09-19 DIAGNOSIS — I10 Essential (primary) hypertension: Secondary | ICD-10-CM

## 2022-09-20 ENCOUNTER — Other Ambulatory Visit: Payer: Self-pay | Admitting: Neurosurgery

## 2022-09-25 ENCOUNTER — Other Ambulatory Visit: Payer: Self-pay | Admitting: Family Medicine

## 2022-09-25 DIAGNOSIS — Z5181 Encounter for therapeutic drug level monitoring: Secondary | ICD-10-CM

## 2022-09-26 ENCOUNTER — Other Ambulatory Visit: Payer: Self-pay | Admitting: Neurosurgery

## 2022-09-28 NOTE — Progress Notes (Signed)
Surgical Instructions    Your procedure is scheduled on Tuesday, October 31st, 2023.   Report to [facility] Main Entrance "A" at 09:40 A.M., then check in with the Admitting office.  Call this number if you have problems the morning of surgery:  [phone]   If you have any questions prior to your surgery date call [phone]: Open Monday-Friday 8am-4pm If you experience any cold or flu symptoms such as cough, fever, chills, shortness of breath, etc. between now and your scheduled surgery, please notify us at the above number     Remember:  Do not eat or drink after midnight the night before your surgery    Take these medicines the morning of surgery with A SIP OF WATER:   famotidine (PEPCID)  gabapentin (NEURONTIN) gemfibrozil (LOPID) levothyroxine (SYNTHROID) ondansetron (ZOFRAN) - if needed  Follow your surgeon's instructions on when to stop Plavix.  If no instructions were given by your surgeon then you will need to call the office to get those instructions.     As of today, STOP taking any Aspirin (unless otherwise instructed by your surgeon) Aleve, Naproxen, Ibuprofen, Motrin, Advil, Goody's, BC's, all herbal medications, fish oil, and all vitamins.   WHAT DO I DO ABOUT MY DIABETES MEDICATION?   Do not take glipiZIDE (GLUCOTROL) the night before surgery and the morning of surgery.  Hold Metformin 48 hrs prior to surgery  THE NIGHT BEFORE SURGERY, take 30 units of TRESIBA FLEXTOUCH 100 UNIT/ML - 50% of your regular dose     HOW TO MANAGE YOUR DIABETES BEFORE AND AFTER SURGERY  Why is it important to control my blood sugar before and after surgery? Improving blood sugar levels before and after surgery helps healing and can limit problems. A way of improving blood sugar control is eating a healthy diet by:  Eating less sugar and carbohydrates  Increasing activity/exercise  Talking with your doctor about reaching your blood sugar goals High blood sugars (greater  than 180 mg/dL) can raise your risk of infections and slow your recovery, so you will need to focus on controlling your diabetes during the weeks before surgery. Make sure that the doctor who takes care of your diabetes knows about your planned surgery including the date and location.  How do I manage my blood sugar before surgery? Check your blood sugar at least 4 times a day, starting 2 days before surgery, to make sure that the level is not too high or low.  Check your blood sugar the morning of your surgery when you wake up and every 2 hours until you get to the Short Stay unit.  If your blood sugar is less than 70 mg/dL, you will need to treat for low blood sugar: Do not take insulin. Treat a low blood sugar (less than 70 mg/dL) with  cup of clear juice (cranberry or apple), 4 glucose tablets, OR glucose gel. Recheck blood sugar in 15 minutes after treatment (to make sure it is greater than 70 mg/dL). If your blood sugar is not greater than 70 mg/dL on recheck, call [phone] for further instructions. Report your blood sugar to the short stay nurse when you get to Short Stay.  If you are admitted to the hospital after surgery: Your blood sugar will be checked by the staff and you will probably be given insulin after surgery (instead of oral diabetes medicines) to make sure you have good blood sugar levels. The goal for blood sugar control after surgery is 80-180 mg/dL.  The day of surgery:          Do not wear jewelry or makeup. Do not wear lotions, powders, perfumes or deodorant. Do not shave 48 hours prior to surgery.   Do not bring valuables to the hospital. Do not wear nail polish, gel polish, artificial nails, or any other type of covering on natural nails (fingers and toes) If you have artificial nails or gel coating that need to be removed by a nail salon, please have this removed prior to surgery. Artificial nails or gel coating may interfere with anesthesia's ability to  adequately monitor your vital signs.  [facility] is not responsible for any belongings or valuables.    Do NOT Smoke (Tobacco/Vaping)  24 hours prior to your procedure  If you use a CPAP at night, you may bring your mask for your overnight stay.   Contacts, glasses, hearing aids, dentures or partials may not be worn into surgery, please bring cases for these belongings   For patients admitted to the hospital, discharge time will be determined by your treatment team.   Patients discharged the day of surgery will not be allowed to drive home, and someone needs to stay with them for 24 hours.   SURGICAL WAITING ROOM VISITATION Patients having surgery or a procedure may have no more than 2 support people in the waiting area - these visitors may rotate.   Children under the age of 33 must have an adult with them who is not the patient. If the patient needs to stay at the hospital during part of their recovery, the visitor guidelines for inpatient rooms apply. Pre-op nurse will coordinate an appropriate time for 1 support person to accompany patient in pre-op.  This support person may not rotate.   Please refer to [email or web address] for the visitor guidelines for Inpatients (after your surgery is over and you are in a regular room).    Special instructions:    Oral Hygiene is also important to reduce your risk of infection.  Remember - BRUSH YOUR TEETH THE MORNING OF SURGERY WITH YOUR REGULAR TOOTHPASTE   [facility]- Preparing For Surgery  Before surgery, you can play an important role. Because skin is not sterile, your skin needs to be as free of germs as possible. You can reduce the number of germs on your skin by washing with CHG (chlorahexidine gluconate) Soap before surgery.  CHG is an antiseptic cleaner which kills germs and bonds with the skin to continue killing germs even after washing.     Please do not use if you have an  allergy to CHG or antibacterial soaps. If your skin becomes reddened/irritated stop using the CHG.  Do not shave (including legs and underarms) for at least 48 hours prior to first CHG shower. It is OK to shave your face.  Please follow these instructions carefully.     Shower the NIGHT BEFORE SURGERY and the MORNING OF SURGERY with CHG Soap.   If you chose to wash your hair, wash your hair first as usual with your normal shampoo. After you shampoo, rinse your hair and body thoroughly to remove the shampoo.  Then [employer] and genitals (private parts) with your normal soap and rinse thoroughly to remove soap.  After that Use CHG Soap as you would any other liquid soap. You can apply CHG directly to the skin and wash gently with a scrungie or a clean washcloth.   Apply the CHG Soap to your body ONLY FROM  THE NECK DOWN.  Do not use on open wounds or open sores. Avoid contact with your eyes, ears, mouth and genitals (private parts). Wash Face and genitals (private parts)  with your normal soap.   Wash thoroughly, paying special attention to the area where your surgery will be performed.  Thoroughly rinse your body with warm water from the neck down.  DO NOT shower/wash with your normal soap after using and rinsing off the CHG Soap.  Pat yourself dry with a CLEAN TOWEL.  Wear CLEAN PAJAMAS to bed the night before surgery  Place CLEAN SHEETS on your bed the night before your surgery  DO NOT SLEEP WITH PETS.   Day of Surgery:  Take a shower with CHG soap. Wear Clean/Comfortable clothing the morning of surgery Do not apply any deodorants/lotions.   Remember to brush your teeth WITH YOUR REGULAR TOOTHPASTE.    If you received a COVID test during your pre-op visit, it is requested that you wear a mask when out in public, stay away from anyone that may not be feeling well, and notify your surgeon if you develop symptoms. If you have been in contact with anyone that has tested positive in  the last 10 days, please notify your surgeon.    Please read over the following fact sheets that you were given.

## 2022-09-29 ENCOUNTER — Encounter (HOSPITAL_COMMUNITY): Payer: Self-pay

## 2022-09-29 ENCOUNTER — Encounter (HOSPITAL_COMMUNITY)
Admission: RE | Admit: 2022-09-29 | Discharge: 2022-09-29 | Disposition: A | Discharge status: Home / Self Care | Payer: Medicare HMO | Source: Ambulatory Visit | Attending: Neurosurgery | Admitting: Neurosurgery

## 2022-09-29 ENCOUNTER — Other Ambulatory Visit: Payer: Self-pay

## 2022-09-29 VITALS — BP 130/52 | HR 71 | Resp 18 | Ht 63.0 in | Wt 183.1 lb

## 2022-09-29 DIAGNOSIS — E119 Type 2 diabetes mellitus without complications: Secondary | ICD-10-CM | POA: Insufficient documentation

## 2022-09-29 DIAGNOSIS — Z01812 Encounter for preprocedural laboratory examination: Secondary | ICD-10-CM | POA: Insufficient documentation

## 2022-09-29 DIAGNOSIS — Z794 Long term (current) use of insulin: Secondary | ICD-10-CM | POA: Diagnosis not present

## 2022-09-29 DIAGNOSIS — Z01818 Encounter for other preprocedural examination: Secondary | ICD-10-CM

## 2022-09-29 HISTORY — DX: Hypothyroidism, unspecified: E03.9

## 2022-09-29 HISTORY — DX: Unspecified osteoarthritis, unspecified site: M19.90

## 2022-09-29 LAB — URINALYSIS, ROUTINE W REFLEX MICROSCOPIC
Bilirubin Urine: NEGATIVE
Glucose, UA: 50 mg/dL — AB
Hgb urine dipstick: NEGATIVE
Ketones, ur: NEGATIVE mg/dL
Leukocytes,Ua: NEGATIVE
Nitrite: NEGATIVE
Protein, ur: NEGATIVE mg/dL
Specific Gravity, Urine: 1.014 (ref 1.005–1.030)
pH: 5 (ref 5.0–8.0)

## 2022-09-29 LAB — COMPREHENSIVE METABOLIC PANEL
ALT: 53 U/L — ABNORMAL HIGH (ref 0–44)
AST: 45 U/L — ABNORMAL HIGH (ref 15–41)
Albumin: 4 g/dL (ref 3.5–5.0)
Alkaline Phosphatase: 26 U/L — ABNORMAL LOW (ref 38–126)
Anion gap: 9 (ref 5–15)
BUN: 30 mg/dL — ABNORMAL HIGH (ref 8–23)
CO2: 22 mmol/L (ref 22–32)
Calcium: 9.6 mg/dL (ref 8.9–10.3)
Chloride: 107 mmol/L (ref 98–111)
Creatinine, Ser: 1.47 mg/dL — ABNORMAL HIGH (ref 0.44–1.00)
GFR, Estimated: 37 mL/min — ABNORMAL LOW (ref >60)
Glucose, Bld: 156 mg/dL — ABNORMAL HIGH (ref 70–99)
Potassium: 4.7 mmol/L (ref 3.5–5.1)
Sodium: 138 mmol/L (ref 135–145)
Total Bilirubin: 0.5 mg/dL (ref 0.3–1.2)
Total Protein: 7.4 g/dL (ref 6.5–8.1)

## 2022-09-29 LAB — CBC WITH DIFFERENTIAL/PLATELET
Abs Immature Granulocytes: 0.03 10*3/uL (ref 0.00–0.07)
Basophils Absolute: 0.1 10*3/uL (ref 0.0–0.1)
Basophils Relative: 1 %
Eosinophils Absolute: 0.4 10*3/uL (ref 0.0–0.5)
Eosinophils Relative: 5 %
HCT: 34.2 % — ABNORMAL LOW (ref 36.0–46.0)
Hemoglobin: 11.6 g/dL — ABNORMAL LOW (ref 12.0–15.0)
Immature Granulocytes: 0 %
Lymphocytes Relative: 62 %
Lymphs Abs: 4.3 10*3/uL — ABNORMAL HIGH (ref 0.7–4.0)
MCH: 30.1 pg (ref 26.0–34.0)
MCHC: 33.9 g/dL (ref 30.0–36.0)
MCV: 88.8 fL (ref 80.0–100.0)
Monocytes Absolute: 0.5 10*3/uL (ref 0.1–1.0)
Monocytes Relative: 8 %
Neutro Abs: 1.6 10*3/uL — ABNORMAL LOW (ref 1.7–7.7)
Neutrophils Relative %: 24 %
Platelets: 262 10*3/uL (ref 150–400)
RBC: 3.85 MIL/uL — ABNORMAL LOW (ref 3.87–5.11)
RDW: 15.4 % (ref 11.5–15.5)
WBC: 7 10*3/uL (ref 4.0–10.5)
nRBC: 0 % (ref 0.0–0.2)

## 2022-09-29 LAB — PROTIME-INR
INR: 1.1 (ref 0.8–1.2)
Prothrombin Time: 14 seconds (ref 11.4–15.2)

## 2022-09-29 LAB — HEMOGLOBIN A1C
Hgb A1c MFr Bld: 8.5 % — ABNORMAL HIGH (ref 4.8–5.6)
Mean Plasma Glucose: 197.25 mg/dL

## 2022-09-29 LAB — APTT: aPTT: 35 seconds (ref 24–36)

## 2022-09-29 LAB — GLUCOSE, CAPILLARY: Glucose-Capillary: 180 mg/dL — ABNORMAL HIGH (ref 70–99)

## 2022-09-29 NOTE — Progress Notes (Signed)
Abnormal UA in PAT; Dr. Kathyrn Sheriff was notified via [facility]

## 2022-09-29 NOTE — Progress Notes (Signed)
PCP - Dr. Janett Billow [place] Cardiologist - Dr. Shirlee More  PPM/ICD - n/a  Chest x-ray - n/a EKG - 05/04/22 Stress Test - 05/30/22 ECHO - 02/24/12 Cardiac Cath - denies  Sleep Study - OSA+ CPAP - uses nightly   Fasting Blood Sugar -  Checks Blood Sugar 2-3 times a day CBG 180 at PAT. Will obtain A1C today.  Last dose of GLP1 agonist-  n/a GLP1 instructions: n/a  Blood Thinner Instructions: Plavix, pt was told to continue Plavix Aspirin Instructions: n/a  NPO  COVID TEST- n/a  Anesthesia review: yes, medical conditions and recent C-Diff treatment.   Patient denies shortness of breath, fever, cough and chest pain at PAT appointment   All instructions explained to the patient, with a verbal understanding of the material. Patient agrees to go over the instructions while at home for a better understanding. Patient also instructed to self quarantine after being tested for COVID-19. The opportunity to ask questions was provided.

## 2022-10-02 NOTE — Progress Notes (Signed)
Anesthesia Chart Review:  Pertinent hx includes former smoker (quit 06/07/99) with h/o PONV, OSA on CPAP, PAD, CKD 3, AAA s/p repair 2012 (stable on abd CT 09/2022), bilateral breast cancer (s/p bilateral mastectomy, chemo), cervical cancer (s/p TAH w/BSO), insulin-dependent DM II, hiatal hernia.   Recent cardiology evaluation prompted by coronary calcification seen on CT.  Seen by Dr. Bettina Gavia 05/18/2022 and nuclear stress test was ordered.  Test on 05/30/2022 was low risk, no ischemia.  Preop labs reviewed, creatinine elevated 1.47 consistent with history of CKD 3, mild anemia with hemoglobin 11.6, IDDM 2 poorly controlled A1c 8.5, otherwise unremarkable.  EKG 05/03/2022: NSR.  Rate 85.  CT abdomen pelvis 09/16/2022: IMPRESSION: 1. No acute intra-abdominal process. 2. Hepatic steatosis. 3. Diverticulosis without diverticulitis. 4. Cardiomegaly with coronary artery calcifications. 5. Aortic atherosclerosis with prior stent graft repair of abdominal aortic aneurysm.  Nuclear stress 05/30/2022:   Findings are consistent with no prior ischemia. The study is low risk.   No ST deviation was noted.   LV perfusion is normal.   Left ventricular function is normal. Nuclear stress EF: 61 %. The left ventricular ejection fraction is normal (55-65%). End diastolic cavity size is normal. End systolic cavity size is normal.   No reversible ischemia. LVEF 61% with normal wall motion. This is a low risk study. No prior for comparison.    Mandy Kim [facility] Short Stay Center/Anesthesiology Phone [phone] 10/02/2022 2:29 PM

## 2022-10-02 NOTE — Anesthesia Preprocedure Evaluation (Signed)
Anesthesia Evaluation  Patient identified by MRN, date of birth, ID band Patient awake    Reviewed: Allergy & Precautions, NPO status , Patient's Chart, lab work & pertinent test results  History of Anesthesia Complications (+) PONV  Airway Mallampati: II  TM Distance: >3 FB Neck ROM: Full    Dental  (+) Edentulous Upper, Edentulous Lower   Pulmonary sleep apnea and Continuous Positive Airway Pressure Ventilation , former smoker   breath sounds clear to auscultation       Cardiovascular hypertension, Pt. on medications (-) angina + Peripheral Vascular Disease   Rhythm:Regular Rate:Normal  05/2022 Stress: no ischemia, normal LV perfusion   Neuro/Psych Cerebral aneurysm    GI/Hepatic Neg liver ROS, hiatal hernia,GERD  Controlled and Medicated,,  Endo/Other  diabetes (glu 167), Insulin Dependent, Oral Hypoglycemic AgentsHypothyroidism  BMI 32.4  Renal/GU Renal InsufficiencyRenal diseaseGlu 1.47     Musculoskeletal  (+) Arthritis ,    Abdominal  (+) + obese  Peds  Hematology   Anesthesia Other Findings Breast cancer  Reproductive/Obstetrics                             Anesthesia Physical Anesthesia Plan  ASA: 3  Anesthesia Plan: General   Post-op Pain Management: Tylenol PO (pre-op)*   Induction: Intravenous  PONV Risk Score and Plan: 4 or greater and Ondansetron, Dexamethasone, Propofol infusion and Treatment may vary due to age or medical condition  Airway Management Planned: Oral ETT  Additional Equipment: Arterial line  Intra-op Plan:   Post-operative Plan: Extubation in OR  Informed Consent: I have reviewed the patients History and Physical, chart, labs and discussed the procedure including the risks, benefits and alternatives for the proposed anesthesia with the patient or authorized representative who has indicated his/her understanding and acceptance.       Plan  Discussed with: CRNA and Surgeon  Anesthesia Plan Comments: (PAT note by Karoline Caldwell, PA-C: Pertinent hx includes former smoker (quit 06/07/99) with h/o PONV, OSA on CPAP, PAD, CKD 3, AAA s/p repair 2012 (stable on abd CT 09/2022), bilateral breast cancer (s/p bilateral mastectomy, chemo), cervical cancer (s/p TAH w/BSO), insulin-dependent DM II, hiatal hernia.   Recent cardiology evaluation prompted by coronary calcification seen on CT.  Seen by Dr. Bettina Gavia 05/18/2022 and nuclear stress test was ordered.  Test on 05/30/2022 was low risk, no ischemia.  Preop labs reviewed, creatinine elevated 1.47 consistent with history of CKD 3, mild anemia with hemoglobin 11.6, IDDM 2 poorly controlled A1c 8.5, otherwise unremarkable.  EKG 05/03/2022: NSR.  Rate 85.  CT abdomen pelvis 09/16/2022: IMPRESSION: 1. No acute intra-abdominal process. 2. Hepatic steatosis. 3. Diverticulosis without diverticulitis. 4. Cardiomegaly with coronary artery calcifications. 5. Aortic atherosclerosis with prior stent graft repair of abdominal aortic aneurysm.  Nuclear stress 05/30/2022: . Findings are consistent with no prior ischemia. The study is low risk. . No ST deviation was noted. . LV perfusion is normal. . Left ventricular function is normal. Nuclear stress EF: 61 %. The left ventricular ejection fraction is normal (55-65%). End diastolic cavity size is normal. End systolic cavity size is normal.  No reversible ischemia. LVEF 61% with normal wall motion. This is a low risk study. No prior for comparison.  )        Anesthesia Quick Evaluation

## 2022-10-03 ENCOUNTER — Encounter (HOSPITAL_COMMUNITY)
Admission: RE | Disposition: A | Discharge status: Home / Self Care | Payer: Self-pay | Source: Home / Self Care | Attending: Neurosurgery

## 2022-10-03 ENCOUNTER — Inpatient Hospital Stay (HOSPITAL_COMMUNITY): Payer: Medicare HMO | Admitting: Physician Assistant

## 2022-10-03 ENCOUNTER — Encounter (HOSPITAL_COMMUNITY): Payer: Self-pay | Admitting: Neurosurgery

## 2022-10-03 ENCOUNTER — Inpatient Hospital Stay (HOSPITAL_COMMUNITY)
Admission: RE | Admit: 2022-10-03 | Discharge: 2022-10-04 | DRG: 027 | Disposition: A | Discharge status: Home / Self Care | Payer: Medicare HMO | Attending: Neurosurgery | Admitting: Neurosurgery

## 2022-10-03 ENCOUNTER — Other Ambulatory Visit: Payer: Self-pay

## 2022-10-03 ENCOUNTER — Inpatient Hospital Stay (HOSPITAL_COMMUNITY): Payer: Medicare HMO | Admitting: Anesthesiology

## 2022-10-03 ENCOUNTER — Inpatient Hospital Stay (HOSPITAL_COMMUNITY)
Admission: RE | Admit: 2022-10-03 | Discharge: 2022-10-03 | Disposition: A | Discharge status: Home / Self Care | Payer: Medicare HMO | Source: Ambulatory Visit | Attending: Neurosurgery | Admitting: Neurosurgery

## 2022-10-03 DIAGNOSIS — E782 Mixed hyperlipidemia: Secondary | ICD-10-CM | POA: Diagnosis present

## 2022-10-03 DIAGNOSIS — Z8541 Personal history of malignant neoplasm of cervix uteri: Secondary | ICD-10-CM

## 2022-10-03 DIAGNOSIS — E1151 Type 2 diabetes mellitus with diabetic peripheral angiopathy without gangrene: Secondary | ICD-10-CM

## 2022-10-03 DIAGNOSIS — Z87891 Personal history of nicotine dependence: Secondary | ICD-10-CM

## 2022-10-03 DIAGNOSIS — Z888 Allergy status to other drugs, medicaments and biological substances status: Secondary | ICD-10-CM

## 2022-10-03 DIAGNOSIS — I671 Cerebral aneurysm, nonruptured: Secondary | ICD-10-CM

## 2022-10-03 DIAGNOSIS — Z9013 Acquired absence of bilateral breasts and nipples: Secondary | ICD-10-CM | POA: Diagnosis not present

## 2022-10-03 DIAGNOSIS — I10 Essential (primary) hypertension: Secondary | ICD-10-CM | POA: Diagnosis not present

## 2022-10-03 DIAGNOSIS — E1122 Type 2 diabetes mellitus with diabetic chronic kidney disease: Secondary | ICD-10-CM | POA: Diagnosis not present

## 2022-10-03 DIAGNOSIS — Z91030 Bee allergy status: Secondary | ICD-10-CM | POA: Diagnosis not present

## 2022-10-03 DIAGNOSIS — Z886 Allergy status to analgesic agent status: Secondary | ICD-10-CM

## 2022-10-03 DIAGNOSIS — G4733 Obstructive sleep apnea (adult) (pediatric): Secondary | ICD-10-CM | POA: Diagnosis not present

## 2022-10-03 DIAGNOSIS — I129 Hypertensive chronic kidney disease with stage 1 through stage 4 chronic kidney disease, or unspecified chronic kidney disease: Secondary | ICD-10-CM | POA: Diagnosis not present

## 2022-10-03 DIAGNOSIS — Z961 Presence of intraocular lens: Secondary | ICD-10-CM | POA: Diagnosis present

## 2022-10-03 DIAGNOSIS — Z794 Long term (current) use of insulin: Secondary | ICD-10-CM

## 2022-10-03 DIAGNOSIS — Z9841 Cataract extraction status, right eye: Secondary | ICD-10-CM

## 2022-10-03 DIAGNOSIS — Z85828 Personal history of other malignant neoplasm of skin: Secondary | ICD-10-CM | POA: Diagnosis not present

## 2022-10-03 DIAGNOSIS — K760 Fatty (change of) liver, not elsewhere classified: Secondary | ICD-10-CM | POA: Diagnosis present

## 2022-10-03 DIAGNOSIS — E1142 Type 2 diabetes mellitus with diabetic polyneuropathy: Secondary | ICD-10-CM | POA: Diagnosis not present

## 2022-10-03 DIAGNOSIS — K219 Gastro-esophageal reflux disease without esophagitis: Secondary | ICD-10-CM | POA: Diagnosis present

## 2022-10-03 DIAGNOSIS — Z79890 Hormone replacement therapy (postmenopausal): Secondary | ICD-10-CM

## 2022-10-03 DIAGNOSIS — E039 Hypothyroidism, unspecified: Secondary | ICD-10-CM | POA: Diagnosis not present

## 2022-10-03 DIAGNOSIS — E1169 Type 2 diabetes mellitus with other specified complication: Secondary | ICD-10-CM | POA: Diagnosis present

## 2022-10-03 DIAGNOSIS — Z8679 Personal history of other diseases of the circulatory system: Secondary | ICD-10-CM | POA: Diagnosis not present

## 2022-10-03 DIAGNOSIS — N183 Chronic kidney disease, stage 3 unspecified: Secondary | ICD-10-CM | POA: Diagnosis not present

## 2022-10-03 DIAGNOSIS — Z9842 Cataract extraction status, left eye: Secondary | ICD-10-CM

## 2022-10-03 DIAGNOSIS — Z7984 Long term (current) use of oral hypoglycemic drugs: Secondary | ICD-10-CM

## 2022-10-03 DIAGNOSIS — Z7902 Long term (current) use of antithrombotics/antiplatelets: Secondary | ICD-10-CM

## 2022-10-03 DIAGNOSIS — Z7901 Long term (current) use of anticoagulants: Secondary | ICD-10-CM

## 2022-10-03 DIAGNOSIS — Z853 Personal history of malignant neoplasm of breast: Secondary | ICD-10-CM | POA: Diagnosis not present

## 2022-10-03 DIAGNOSIS — E119 Type 2 diabetes mellitus without complications: Secondary | ICD-10-CM

## 2022-10-03 DIAGNOSIS — Z9989 Dependence on other enabling machines and devices: Secondary | ICD-10-CM | POA: Diagnosis not present

## 2022-10-03 DIAGNOSIS — Z79899 Other long term (current) drug therapy: Secondary | ICD-10-CM

## 2022-10-03 HISTORY — PX: RADIOLOGY WITH ANESTHESIA: SHX6223

## 2022-10-03 HISTORY — PX: IR ANGIO INTRA EXTRACRAN SEL INTERNAL CAROTID UNI L MOD SED: IMG5361

## 2022-10-03 HISTORY — PX: IR ANGIOGRAM FOLLOW UP STUDY: IMG697

## 2022-10-03 HISTORY — PX: IR TRANSCATH/EMBOLIZ: IMG695

## 2022-10-03 HISTORY — PX: IR NEURO EACH ADD'L AFTER BASIC UNI LEFT (MS): IMG5373

## 2022-10-03 LAB — CBC
HCT: 32.5 % — ABNORMAL LOW (ref 36.0–46.0)
Hemoglobin: 11.1 g/dL — ABNORMAL LOW (ref 12.0–15.0)
MCH: 30.2 pg (ref 26.0–34.0)
MCHC: 34.2 g/dL (ref 30.0–36.0)
MCV: 88.6 fL (ref 80.0–100.0)
Platelets: 257 10*3/uL (ref 150–400)
RBC: 3.67 MIL/uL — ABNORMAL LOW (ref 3.87–5.11)
RDW: 15.3 % (ref 11.5–15.5)
WBC: 7.7 10*3/uL (ref 4.0–10.5)
nRBC: 0 % (ref 0.0–0.2)

## 2022-10-03 LAB — CREATININE, SERUM
Creatinine, Ser: 1.54 mg/dL — ABNORMAL HIGH (ref 0.44–1.00)
GFR, Estimated: 35 mL/min — ABNORMAL LOW (ref >60)

## 2022-10-03 LAB — MRSA NEXT GEN BY PCR, NASAL: MRSA by PCR Next Gen: NOT DETECTED

## 2022-10-03 LAB — GLUCOSE, CAPILLARY
Glucose-Capillary: 146 mg/dL — ABNORMAL HIGH (ref 70–99)
Glucose-Capillary: 162 mg/dL — ABNORMAL HIGH (ref 70–99)
Glucose-Capillary: 167 mg/dL — ABNORMAL HIGH (ref 70–99)
Glucose-Capillary: 225 mg/dL — ABNORMAL HIGH (ref 70–99)

## 2022-10-03 SURGERY — IR WITH ANESTHESIA
Anesthesia: General

## 2022-10-03 MED ORDER — FENTANYL CITRATE (PF) 250 MCG/5ML IJ SOLN
INTRAMUSCULAR | Status: AC
  Filled 2022-10-03: qty 5

## 2022-10-03 MED ORDER — GLYCOPYRROLATE PF 0.2 MG/ML IJ SOSY
PREFILLED_SYRINGE | INTRAMUSCULAR | Status: AC
  Filled 2022-10-03: qty 1

## 2022-10-03 MED ORDER — INSULIN ASPART 100 UNIT/ML IJ SOLN
0.0000 [IU] | Freq: Three times a day (TID) | INTRAMUSCULAR | Status: DC
  Administered 2022-10-04: 3 [IU] via SUBCUTANEOUS

## 2022-10-03 MED ORDER — LEVOTHYROXINE SODIUM 50 MCG PO TABS
50.0000 ug | ORAL_TABLET | Freq: Every day | ORAL | Status: DC
  Filled 2022-10-03: qty 1

## 2022-10-03 MED ORDER — DEXAMETHASONE SODIUM PHOSPHATE 10 MG/ML IJ SOLN
INTRAMUSCULAR | Status: DC | PRN
  Administered 2022-10-03: 5 mg via INTRAVENOUS

## 2022-10-03 MED ORDER — EPHEDRINE 5 MG/ML INJ
INTRAVENOUS | Status: AC
  Filled 2022-10-03: qty 5

## 2022-10-03 MED ORDER — CHLORHEXIDINE GLUCONATE 0.12 % MT SOLN
15.0000 mL | Freq: Once | OROMUCOSAL | Status: AC
  Administered 2022-10-03: 15 mL via OROMUCOSAL
  Filled 2022-10-03: qty 15

## 2022-10-03 MED ORDER — GABAPENTIN 100 MG PO CAPS
100.0000 mg | ORAL_CAPSULE | Freq: Two times a day (BID) | ORAL | Status: DC
  Administered 2022-10-03 – 2022-10-04 (×2): 100 mg via ORAL
  Filled 2022-10-03 (×2): qty 1

## 2022-10-03 MED ORDER — FENTANYL CITRATE (PF) 100 MCG/2ML IJ SOLN: 25.0000 ug | INTRAMUSCULAR | Status: DC | PRN

## 2022-10-03 MED ORDER — CHLORHEXIDINE GLUCONATE CLOTH 2 % EX PADS: 6.0000 | MEDICATED_PAD | Freq: Once | CUTANEOUS | Status: DC

## 2022-10-03 MED ORDER — SUGAMMADEX SODIUM 200 MG/2ML IV SOLN
INTRAVENOUS | Status: DC | PRN
  Administered 2022-10-03: 200 mg via INTRAVENOUS

## 2022-10-03 MED ORDER — CLOPIDOGREL BISULFATE 75 MG PO TABS
75.0000 mg | ORAL_TABLET | ORAL | Status: DC
  Administered 2022-10-03: 75 mg via ORAL
  Filled 2022-10-03: qty 1

## 2022-10-03 MED ORDER — ROCURONIUM BROMIDE 10 MG/ML (PF) SYRINGE
PREFILLED_SYRINGE | INTRAVENOUS | Status: DC | PRN
  Administered 2022-10-03: 20 mg via INTRAVENOUS
  Administered 2022-10-03: 70 mg via INTRAVENOUS

## 2022-10-03 MED ORDER — CLOPIDOGREL BISULFATE 75 MG PO TABS
ORAL_TABLET | ORAL | Status: AC
  Filled 2022-10-03: qty 1

## 2022-10-03 MED ORDER — CARBOXYMETHYLCELLUL-GLYCERIN 0.5-0.9 % OP SOLN: 2.0000 [drp] | Freq: Three times a day (TID) | OPHTHALMIC | Status: DC

## 2022-10-03 MED ORDER — MORPHINE SULFATE (PF) 2 MG/ML IV SOLN: 1.0000 mg | INTRAVENOUS | Status: DC | PRN

## 2022-10-03 MED ORDER — ONDANSETRON HCL 4 MG/2ML IJ SOLN
INTRAMUSCULAR | Status: DC | PRN
  Administered 2022-10-03: 4 mg via INTRAVENOUS

## 2022-10-03 MED ORDER — ONDANSETRON HCL 4 MG PO TABS: 4.0000 mg | ORAL_TABLET | ORAL | Status: DC | PRN

## 2022-10-03 MED ORDER — ONDANSETRON HCL 4 MG/2ML IJ SOLN: 4.0000 mg | INTRAMUSCULAR | Status: DC | PRN

## 2022-10-03 MED ORDER — CLEVIDIPINE BUTYRATE 0.5 MG/ML IV EMUL
INTRAVENOUS | Status: AC
  Filled 2022-10-03: qty 50

## 2022-10-03 MED ORDER — PHENYLEPHRINE HCL-NACL 20-0.9 MG/250ML-% IV SOLN
INTRAVENOUS | Status: DC | PRN
  Administered 2022-10-03: 50 ug/min via INTRAVENOUS

## 2022-10-03 MED ORDER — PHENYLEPHRINE 80 MCG/ML (10ML) SYRINGE FOR IV PUSH (FOR BLOOD PRESSURE SUPPORT)
PREFILLED_SYRINGE | INTRAVENOUS | Status: AC
  Filled 2022-10-03: qty 10

## 2022-10-03 MED ORDER — SUCCINYLCHOLINE CHLORIDE 200 MG/10ML IV SOSY
PREFILLED_SYRINGE | INTRAVENOUS | Status: AC
  Filled 2022-10-03: qty 10

## 2022-10-03 MED ORDER — GEMFIBROZIL 600 MG PO TABS
600.0000 mg | ORAL_TABLET | Freq: Two times a day (BID) | ORAL | Status: DC
  Administered 2022-10-03 – 2022-10-04 (×2): 600 mg via ORAL
  Filled 2022-10-03 (×3): qty 1

## 2022-10-03 MED ORDER — ROCURONIUM BROMIDE 10 MG/ML (PF) SYRINGE
PREFILLED_SYRINGE | INTRAVENOUS | Status: AC
  Filled 2022-10-03: qty 10

## 2022-10-03 MED ORDER — INSULIN DEGLUDEC 100 UNIT/ML ~~LOC~~ SOPN: 60.0000 [IU] | PEN_INJECTOR | SUBCUTANEOUS | Status: DC

## 2022-10-03 MED ORDER — HYDROCODONE-ACETAMINOPHEN 5-325 MG PO TABS: 1.0000 | ORAL_TABLET | ORAL | Status: DC | PRN

## 2022-10-03 MED ORDER — GLIPIZIDE 5 MG PO TABS
5.0000 mg | ORAL_TABLET | Freq: Two times a day (BID) | ORAL | Status: DC
  Administered 2022-10-03 – 2022-10-04 (×2): 5 mg via ORAL
  Filled 2022-10-03 (×3): qty 1

## 2022-10-03 MED ORDER — LOSARTAN POTASSIUM 50 MG PO TABS
50.0000 mg | ORAL_TABLET | Freq: Every day | ORAL | Status: DC
  Administered 2022-10-03 – 2022-10-04 (×2): 50 mg via ORAL
  Filled 2022-10-03 (×2): qty 1

## 2022-10-03 MED ORDER — PROPOFOL 10 MG/ML IV BOLUS
INTRAVENOUS | Status: AC
  Filled 2022-10-03: qty 20

## 2022-10-03 MED ORDER — SODIUM CHLORIDE 0.9 % IV SOLN: INTRAVENOUS | Status: DC

## 2022-10-03 MED ORDER — CEFAZOLIN SODIUM-DEXTROSE 2-4 GM/100ML-% IV SOLN
INTRAVENOUS | Status: AC
  Filled 2022-10-03: qty 100

## 2022-10-03 MED ORDER — FAMOTIDINE 20 MG PO TABS
20.0000 mg | ORAL_TABLET | Freq: Every day | ORAL | Status: DC
  Administered 2022-10-03 – 2022-10-04 (×2): 20 mg via ORAL
  Filled 2022-10-03 (×2): qty 1

## 2022-10-03 MED ORDER — CHLORHEXIDINE GLUCONATE CLOTH 2 % EX PADS: 6.0000 | MEDICATED_PAD | Freq: Every day | CUTANEOUS | Status: DC

## 2022-10-03 MED ORDER — ORAL CARE MOUTH RINSE: 15.0000 mL | OROMUCOSAL | Status: DC | PRN

## 2022-10-03 MED ORDER — PANTOPRAZOLE SODIUM 40 MG IV SOLR
40.0000 mg | Freq: Every day | INTRAVENOUS | Status: DC
  Administered 2022-10-03: 40 mg via INTRAVENOUS
  Filled 2022-10-03: qty 10

## 2022-10-03 MED ORDER — LACTATED RINGERS IV SOLN: INTRAVENOUS | Status: DC | PRN

## 2022-10-03 MED ORDER — METFORMIN HCL 500 MG PO TABS
1000.0000 mg | ORAL_TABLET | Freq: Every day | ORAL | Status: DC
  Administered 2022-10-04: 1000 mg via ORAL
  Filled 2022-10-03: qty 2

## 2022-10-03 MED ORDER — CEFAZOLIN SODIUM-DEXTROSE 2-4 GM/100ML-% IV SOLN
2.0000 g | INTRAVENOUS | Status: AC
  Administered 2022-10-03: 2 g via INTRAVENOUS

## 2022-10-03 MED ORDER — INSULIN ASPART 100 UNIT/ML IJ SOLN: 0.0000 [IU] | INTRAMUSCULAR | Status: DC | PRN

## 2022-10-03 MED ORDER — CLOPIDOGREL BISULFATE 75 MG PO TABS
75.0000 mg | ORAL_TABLET | Freq: Every day | ORAL | Status: DC
  Administered 2022-10-03 – 2022-10-04 (×2): 75 mg via ORAL
  Filled 2022-10-03: qty 1

## 2022-10-03 MED ORDER — POLYVINYL ALCOHOL 1.4 % OP SOLN
2.0000 [drp] | Freq: Three times a day (TID) | OPHTHALMIC | Status: DC
  Administered 2022-10-03 – 2022-10-04 (×2): 2 [drp] via OPHTHALMIC
  Filled 2022-10-03 (×2): qty 15

## 2022-10-03 MED ORDER — PROPOFOL 500 MG/50ML IV EMUL
INTRAVENOUS | Status: DC | PRN
  Administered 2022-10-03: 75 ug/kg/min via INTRAVENOUS

## 2022-10-03 MED ORDER — IOHEXOL 300 MG/ML  SOLN
150.0000 mL | Freq: Once | INTRAMUSCULAR | Status: AC | PRN
  Administered 2022-10-03: 90 mL via INTRA_ARTERIAL

## 2022-10-03 MED ORDER — FENTANYL CITRATE (PF) 250 MCG/5ML IJ SOLN
INTRAMUSCULAR | Status: DC | PRN
  Administered 2022-10-03: 150 ug via INTRAVENOUS

## 2022-10-03 MED ORDER — ORAL CARE MOUTH RINSE: 15.0000 mL | Freq: Once | OROMUCOSAL | Status: AC

## 2022-10-03 MED ORDER — PROPOFOL 10 MG/ML IV BOLUS
INTRAVENOUS | Status: DC | PRN
  Administered 2022-10-03: 80 mg via INTRAVENOUS

## 2022-10-03 MED ORDER — HEPARIN SODIUM (PORCINE) 1000 UNIT/ML IJ SOLN
INTRAMUSCULAR | Status: DC | PRN
  Administered 2022-10-03: 5000 [IU] via INTRAVENOUS
  Administered 2022-10-03: 1000 [IU] via INTRAVENOUS

## 2022-10-03 MED ORDER — INSULIN GLARGINE-YFGN 100 UNIT/ML ~~LOC~~ SOLN
60.0000 [IU] | Freq: Every day | SUBCUTANEOUS | Status: DC
  Administered 2022-10-03: 60 [IU] via SUBCUTANEOUS
  Filled 2022-10-03 (×2): qty 0.6

## 2022-10-03 MED ORDER — HEPARIN SODIUM (PORCINE) 5000 UNIT/ML IJ SOLN
5000.0000 [IU] | Freq: Three times a day (TID) | INTRAMUSCULAR | Status: DC
  Administered 2022-10-03 – 2022-10-04 (×2): 5000 [IU] via SUBCUTANEOUS
  Filled 2022-10-03 (×2): qty 1

## 2022-10-03 MED ORDER — ESMOLOL HCL 100 MG/10ML IV SOLN
INTRAVENOUS | Status: DC | PRN
  Administered 2022-10-03: 30 mg via INTRAVENOUS

## 2022-10-03 MED ORDER — SODIUM CHLORIDE 0.9 % IV SOLN: INTRAVENOUS | Status: DC | PRN

## 2022-10-03 MED ORDER — POTASSIUM CHLORIDE CRYS ER 20 MEQ PO TBCR
20.0000 meq | EXTENDED_RELEASE_TABLET | Freq: Every day | ORAL | Status: DC
  Administered 2022-10-03 – 2022-10-04 (×2): 20 meq via ORAL
  Filled 2022-10-03 (×2): qty 1

## 2022-10-03 MED ORDER — LIDOCAINE 2% (20 MG/ML) 5 ML SYRINGE
INTRAMUSCULAR | Status: DC | PRN
  Administered 2022-10-03: 20 mg via INTRAVENOUS

## 2022-10-03 MED ORDER — ADULT MULTIVITAMIN W/MINERALS CH
0.5000 | ORAL_TABLET | Freq: Two times a day (BID) | ORAL | Status: DC
  Administered 2022-10-03 – 2022-10-04 (×2): 0.5 via ORAL
  Filled 2022-10-03 (×2): qty 1

## 2022-10-03 MED ORDER — LABETALOL HCL 5 MG/ML IV SOLN
10.0000 mg | INTRAVENOUS | Status: DC | PRN
  Administered 2022-10-03: 20 mg via INTRAVENOUS
  Filled 2022-10-03: qty 4

## 2022-10-03 NOTE — Anesthesia Procedure Notes (Signed)
Arterial Line Insertion Start/End10/31/2023 10:35 AM, 10/03/2022 10:45 AM Performed by: Dorthea Cove, CRNA, CRNA  Patient location: Pre-op. Preanesthetic checklist: patient identified, IV checked, site marked, risks and benefits discussed, surgical consent, monitors and equipment checked, pre-op evaluation, timeout performed and anesthesia consent Lidocaine 1% used for infiltration Right, radial was placed Catheter size: 20 G Hand hygiene performed  and maximum sterile barriers used   Attempts: 1 Procedure performed without using ultrasound guided technique. Following insertion, dressing applied and Biopatch. Post procedure assessment: normal and unchanged  Patient tolerated the procedure well with no immediate complications.

## 2022-10-03 NOTE — Brief Op Note (Addendum)
  NEUROSURGERY BRIEF OPERATIVE  NOTE   PREOP DX: Aneurysm  POSTOP DX: Same  PROCEDURE: 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  SURGEON: Dr. Consuella Lose, MD  ANESTHESIA: GETA  EBL: Minimal  SPECIMENS: None  COMPLICATIONS: None  CONDITION: Stable to recovery  FINDINGS (Full report in [facility]): 1. Successful coil embolization of left Pcom aneurysm. Small coil tail extruded from aneurysm without clot formation on delayed angiogram.   Consuella Lose, MD [facility]

## 2022-10-03 NOTE — H&P (Signed)
Chief Complaint   Aneurysm  History of Present Illness  Mandy Kim is a 76 y.o. female With a history of incidentally discovered left posterior communicating artery aneurysm.  Patient has previously undergone diagnostic cerebral angiogram further delineating the aneurysm.  She was seen in the Outpatient Neurosurgery Clinic where treatment options were reviewed.  She elected to proceed with endovascular treatment.  Patient has a previous history of aortic abdominal stent graft and is maintained on chronic   Plavix.  She says she is allergic to aspirin.  Past Medical History   Past Medical History:  Diagnosis Date   AAA (abdominal aortic aneurysm) ([place]) 02/12/2012   She is a pt of vascular surgery in [place]- she had a repair done in approx 2013.    Annual [place], stent has been in good position   Acute diarrhea 05/04/2022   Acute lower UTI 05/28/2017   Acute respiratory failure with hypoxia (HCC)    AKI (acute kidney injury) ([place]) 05/04/2022   Anticoagulant long-term use    plavix   Arthritis    Breast cancer ([place]) 02/12/2012   Double mastectomy, 2005.  Now cured   Cancer ([place])    CHF (congestive heart failure) ([place]) 2000   Chronic cystitis    Chronic kidney disease    Cystocele with prolapse 05/23/2019   Dark stools 05/04/2022   Dehydration 05/28/2017   Diverticulitis    Elevated LFTs 05/28/2017   Essential hypertension 05/04/2022   Fatty liver 02/12/2012   GERD (gastroesophageal reflux disease)    GERD without esophagitis 05/04/2022   Hiatal hernia    History of basal cell carcinoma excision    face/ nose right side   History of bilateral breast cancer followed by oncologist until 2008,  per pt no recurrence   right 2002  and left 2003 s/p  mastectomy with completed chemo and tamexifen therapy    History of cervical cancer 2000   s/p  TAH w/ BSO   History of diverticulitis of colon    Hyperlipidemia    Hypothyroidism    Hypoxia 05/05/2022   Mixed hyperlipidemia  due to type 2 diabetes mellitus ([place]) 05/04/2022   OSA on CPAP    PAD (peripheral artery disease) (HCC)    Peripheral neuropathy    PONV (postoperative nausea and vomiting)    Prolapse of female pelvic organs 05/24/2019   S/P AAA (abdominal aortic aneurysm) repair 09/15/2011   Sepsis ([place]) 05/28/2017   Stress incontinence due to pelvic organ prolapse    Type 2 diabetes mellitus treated with insulin ([place])    followed by pcp   Type 2 diabetes mellitus with diabetic polyneuropathy, with long-term current use of insulin ([place]) 02/12/2012   Wears glasses     Past Surgical History   Past Surgical History:  Procedure Laterality Date   ABDOMINAL HYSTERECTOMY  2000   w/  BSO,  and Bladder tacking abdominally   CARPAL TUNNEL RELEASE Left 1999   CATARACT EXTRACTION W/ INTRAOCULAR LENS  IMPLANT, BILATERAL  2015   ENDOVASCULAR REPAIR/STENT GRAFT  09-15-2011    _0    INCONTINENCE SURGERY  2007  approx.   IR ANGIO INTRA EXTRACRAN SEL INTERNAL CAROTID UNI L MOD SED  04/11/2022   IR ANGIO VERTEBRAL SEL VERTEBRAL UNI R MOD SED  04/11/2022   IR US GUIDE VASC ACCESS RIGHT  04/11/2022   MASTECTOMY Bilateral right 2002;  left 2003   lymph node dissection done only on right side   ROBOTIC ASSISTED LAPAROSCOPIC SACROCOLPOPEXY N/A 05/23/2019  Procedure: XI ROBOTIC ASSISTED LAPAROSCOPIC SACROCOLPOPEXY;  Surgeon: Ardis Hughs, MD;  Location: [facility];  Service: Urology;  Laterality: N/A;   TONSILLECTOMY  child   TYMPANOPLASTY Right 1970s   "had my ear drum replacement with a plastic one"   WRIST GANGLION EXCISION Left 1998    Social History   Social History   Tobacco Use   Smoking status: Former    Years: 30.00    Types: Cigarettes    Quit date: 06/07/1999    Years since quitting: 23.3   Smokeless tobacco: Never  Vaping Use   Vaping Use: Never used  Substance Use Topics   Alcohol use: Never   Drug use: Never    Medications   Prior to Admission medications   Medication Sig Start Date  End Date Taking? Authorizing Provider  Carboxymethylcellul-Glycerin (LUBRICATING EYE DROPS OP) Place 1 drop into both eyes 3 (three) times daily.   Yes [provider]  clopidogrel (PLAVIX) 75 MG tablet TAKE 1 TABLET EVERY DAY Patient taking differently: Take 75 mg by mouth every other day. 09/18/22  Yes Copland, Gay Filler, MD  CRANBERRY PO Take 15,000 mg by mouth daily.   Yes [provider]  famotidine (PEPCID) 20 MG tablet Take 1 tablet (20 mg total) by mouth daily. 07/10/22  Yes Copland, Gay Filler, MD  gabapentin (NEURONTIN) 100 MG capsule TAKE 1 CAPSULE TWICE DAILY 05/15/22  Yes Copland, Gay Filler, MD  gemfibrozil (LOPID) 600 MG tablet Take 1 tablet (600 mg total) by mouth 2 (two) times daily before a meal. 08/14/22  Yes Copland, Gay Filler, MD  Ginger, Zingiber officinalis, (GINGER ROOT PO) Take 1 tablet by mouth at bedtime.   Yes [provider]  glipiZIDE (GLUCOTROL) 5 MG tablet TAKE 1 TABLET TWICE DAILY BEFORE MEALS 06/21/22  Yes Copland, Gay Filler, MD  levothyroxine (SYNTHROID) 50 MCG tablet TAKE 1 TABLET EVERY DAY BEFORE BREAKFAST 05/15/22  Yes Copland, Gay Filler, MD  losartan (COZAAR) 50 MG tablet TAKE 1 TABLET EVERY DAY 09/19/22  Yes Copland, Gay Filler, MD  Menthol, Topical Analgesic, (BIOFREEZE ROLL-ON EX) Apply 1 application topically as needed (joint pain).   Yes [provider]  metFORMIN (GLUCOPHAGE) 1000 MG tablet TAKE 1 TABLET TWICE DAILY WITH MEALS Patient taking differently: Take 1,000 mg by mouth daily. 03/13/22  Yes Copland, Gay Filler, MD  Multiple Vitamin (MULTIVITAMIN) tablet Take 0.5 tablets by mouth 2 (two) times daily.    Yes [provider]  Omega-3 Fatty Acids (FISH OIL) 1000 MG CAPS Take 1,000 mg by mouth 2 (two) times a day.    Yes [provider]  ondansetron (ZOFRAN) 4 MG tablet Take 1 tablet (4 mg total) by mouth every 8 (eight) hours as needed for nausea or vomiting. 09/16/22  Yes Domenic Moras, PA-C  potassium  chloride SA (KLOR-CON M) 20 MEQ tablet TAKE 1 TABLET EVERY DAY 09/25/22  Yes Copland, Gay Filler, MD  Probiotic Product (PROBIOTIC DAILY PO) Take 1 capsule by mouth daily.   Yes [provider]  TRESIBA FLEXTOUCH 100 UNIT/ML FlexTouch Pen INJECT  60 UNITS EVERY DAY AT BEDTIME 11/14/21  Yes Copland, Gay Filler, MD  zinc gluconate 50 MG tablet Take 50 mg by mouth at bedtime.   Yes [provider]  ACCU-CHEK AVIVA PLUS test strip TEST BLOOD SUGAR THREE TIMES DAILY 05/15/22   Copland, Gay Filler, MD  Accu-Chek Softclix Lancets lancets TEST BLOOD SUGAR THREE TIMES DAILY 07/20/21   Copland, Gay Filler, MD  Alcohol  Swabs (DROPSAFE ALCOHOL PREP) 70 % PADS USE TO TEST BLOOD SUGAR 3 TIMES DAILY. 07/20/21   Copland, Gay Filler, MD  Alcohol Swabs PADS Use to test blood sugar 3 times daily. Dx: E11.9 01/14/19   Copland, Gay Filler, MD  Blood Glucose Calibration (GLUCOSE CONTROL) SOLN Use to test blood sugar 3 times daily. Dx: E11.9 12/20/15   Copland, Gay Filler, MD  Blood Glucose Monitoring Suppl (ACCU-CHEK AVIVA PLUS) w/Device KIT USE AS DIRECTED  TO TEST BLOOD GLUCOSE BID E11.9 05/11/21   Copland, Gay Filler, MD  Insulin Pen Needle 32G X 4 MM MISC Used to inject insulin 1x daily. 10/18/17   Renato Shin, MD  Insulin Syringe-Needle U-100 (INSULIN SYRINGE 1CC/30GX5/16") 30G X 5/16" 1 ML MISC Use to inject insulin 1 time per day. 05/22/17   Renato Shin, MD  niacin (NIASPAN) 500 MG CR tablet Take 1 tablet (500 mg total) by mouth at bedtime. Patient not taking: Reported on 09/28/2022 08/25/15   Copland, Gay Filler, MD    Allergies   Allergies  Allergen Reactions   Aspirin     Per pt any pain medications - causes fluid build up    Bee Venom Swelling   Ibuprofen Swelling   Ozempic (0.25 Or 0.5 Mg-Dose) [Semaglutide(0.25 Or 0.69m-Dos)] Diarrhea and Nausea And Vomiting    Flatulence   Poison Oak Extract Itching and Swelling   Statins     "skin feels like bugs under the skin"   Trulicity [Dulaglutide]  Nausea And Vomiting and Other (See Comments)    Flatulance, "deathly sick"   Tylenol [Acetaminophen] Swelling   Victoza [Liraglutide] Other (See Comments)    Stomach upset issues     Review of Systems  ROS  Neurologic Exam  Awake, alert, oriented Memory and concentration grossly intact Speech fluent, appropriate CN grossly intact Motor exam: Upper Extremities Deltoid Bicep Tricep Grip  Right 5/5 5/5 5/5 5/5  Left 5/5 5/5 5/5 5/5   Lower Extremities IP Quad PF DF EHL  Right 5/5 5/5 5/5 5/5 5/5  Left 5/5 5/5 5/5 5/5 5/5   Sensation grossly intact to LT  Imaging   Previous diagnostic cerebral angiogram dated May 2023 was again reviewed and demonstrates an approximately 7.5 mm posteriorly projecting aneurysm of the left supraclinoid carotid.  Aneurysm has an approximately 3.5 mm neck.  Impression  - 76y.o. female   With greater than 7 mm left posterior communicating artery aneurysm  Plan  -  we will plan on proceeding with endovascular coiling likely with stent support  I have reviewed the indications for the procedure as well as the details of the procedure and the expected postoperative course and recovery. We have also reviewed in detail the risks, benefits, and alternatives to the procedure. All questions were answered and PVAUDA SALVUCCIprovided informed consent to proceed.  NConsuella Lose MD [facility]

## 2022-10-03 NOTE — Anesthesia Postprocedure Evaluation (Signed)
Anesthesia Post Note  Patient: Mandy Kim  Procedure(s) Performed: 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     Patient location during evaluation: PACU Anesthesia Type: General Level of consciousness: patient cooperative, sedated and oriented Pain management: pain level controlled Vital Signs Assessment: post-procedure vital signs reviewed and stable Respiratory status: spontaneous breathing, nonlabored ventilation, respiratory function stable and patient connected to nasal cannula oxygen Cardiovascular status: blood pressure returned to baseline and stable Postop Assessment: no apparent nausea or vomiting Anesthetic complications: no   No notable events documented.  Last Vitals:  Vitals:   10/03/22 1440 10/03/22 1445  BP: 126/60 (!) 119/53  Pulse: 64 62  Resp: 16 17  Temp:    SpO2: 95% 96%    Last Pain:  Vitals:   10/03/22 1439  TempSrc:   PainSc: 0-No pain                 Arthea Nobel,E. Anik Wesch

## 2022-10-03 NOTE — Anesthesia Procedure Notes (Signed)
Procedure Name: Intubation Date/Time: 10/03/2022 12:04 PM  Performed by: Reece Agar, CRNAPre-anesthesia Checklist: Patient identified, Emergency Drugs available, Suction available and Patient being monitored Patient Re-evaluated:Patient Re-evaluated prior to induction Oxygen Delivery Method: Circle System Utilized Preoxygenation: Pre-oxygenation with 100% oxygen Induction Type: IV induction Ventilation: Mask ventilation without difficulty and Oral airway inserted - appropriate to patient size Laryngoscope Size: Mac and 3 Grade View: Grade I Tube type: Oral Tube size: 7.0 mm Number of attempts: 1 Airway Equipment and Method: Stylet and Oral airway Placement Confirmation: ETT inserted through vocal cords under direct vision, positive ETCO2 and breath sounds checked- equal and bilateral Secured at: 21 cm Tube secured with: Tape Dental Injury: Teeth and Oropharynx as per pre-operative assessment

## 2022-10-03 NOTE — Transfer of Care (Signed)
Immediate Anesthesia Transfer of Care Note  Patient: Mandy Kim  Procedure(s) Performed: 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  Patient Location: PACU  Anesthesia Type:General  Level of Consciousness: awake and alert   Airway & Oxygen Therapy: Patient Spontanous Breathing and Patient connected to face mask oxygen  Post-op Assessment: Report given to RN, Post -op Vital signs reviewed and stable and Patient moving all extremities X 4  Post vital signs: Reviewed and stable  Last Vitals:  Vitals Value Taken Time  BP 119/53 10/03/22 1445  Temp    Pulse 60 10/03/22 1446  Resp 16 10/03/22 1446  SpO2 97 % 10/03/22 1446  Vitals shown include unvalidated device data.  Last Pain:  Vitals:   10/03/22 0949  TempSrc:   PainSc: 0-No pain         Complications: No notable events documented.

## 2022-10-04 ENCOUNTER — Other Ambulatory Visit (HOSPITAL_COMMUNITY): Payer: Self-pay | Admitting: Neurosurgery

## 2022-10-04 ENCOUNTER — Encounter (HOSPITAL_COMMUNITY): Payer: Self-pay | Admitting: Neurosurgery

## 2022-10-04 DIAGNOSIS — I671 Cerebral aneurysm, nonruptured: Secondary | ICD-10-CM

## 2022-10-04 LAB — GLUCOSE, CAPILLARY
Glucose-Capillary: 180 mg/dL — ABNORMAL HIGH (ref 70–99)
Glucose-Capillary: 165 mg/dL — ABNORMAL HIGH (ref 70–99)

## 2022-10-04 NOTE — Discharge Summary (Signed)
Physician Discharge Summary  Patient ID: Mandy Kim MRN: [id number] DOB/AGE: 05-Mar-1946 76 y.o.  Admit date: 10/03/2022 Discharge date: 10/04/2022  Admission Diagnoses:  Cerebral aneurysm  Discharge Diagnoses:  Same Principal Problem:   Cerebral aneurysm   Discharged Condition: Stable  Hospital Course:  Mandy Kim is a 76 y.o. female admitted after elective coil embolization of LICA aneurysm. Pt did well overnight, was tolerating diet, voiding normally, and requested discharge home.  Treatments: Surgery - Coil embolization LICA aneurysm  Discharge Exam: Blood pressure (!) 103/38, pulse 63, temperature 98 F (36.7 C), temperature source Axillary, resp. rate 19, height _0  (1.6 m), weight 83 kg, SpO2 93 %. Awake, alert, oriented Speech fluent, appropriate CN grossly intact 5/5 BUE/BLE Wound c/d/i  Disposition: Discharge disposition: 01-Home or Self Care       Discharge Instructions     Call MD for:  redness, tenderness, or signs of infection (pain, swelling, redness, odor or green/yellow discharge around incision site)   Complete by: As directed    Call MD for:  temperature >100.4   Complete by: As directed    Diet - low sodium heart healthy   Complete by: As directed    Discharge instructions   Complete by: As directed    Walk at home as much as possible, at least 4 times / day   Increase activity slowly   Complete by: As directed    Lifting restrictions   Complete by: As directed    No lifting > 10 lbs   May shower / Bathe   Complete by: As directed    48 hours after surgery   May walk up steps   Complete by: As directed    No dressing needed   Complete by: As directed    Other Restrictions   Complete by: As directed    No bending/twisting at waist      Allergies as of 10/04/2022       Reactions   Aspirin    Per pt any pain medications - causes fluid build up    Bee Venom Swelling   Ibuprofen Swelling   Ozempic (0.25 Or 0.5  Mg-dose) [semaglutide(0.25 Or 0.61m-dos)] Diarrhea, Nausea And Vomiting   Flatulence   Poison Oak Extract Itching, Swelling   Statins    "skin feels like bugs under the skin"   Trulicity [dulaglutide] Nausea And Vomiting, Other (See Comments)   Flatulance, "deathly sick"   Tylenol [acetaminophen] Swelling   Victoza [liraglutide] Other (See Comments)   Stomach upset issues         Medication List     TAKE these medications    Accu-Chek Aviva Plus test strip Generic drug: glucose blood TEST BLOOD SUGAR THREE TIMES DAILY   Accu-Chek Aviva Plus w/Device Kit USE AS DIRECTED  TO TEST BLOOD GLUCOSE BID E11.9   Accu-Chek Softclix Lancets lancets TEST BLOOD SUGAR THREE TIMES DAILY   Alcohol Swabs Pads Use to test blood sugar 3 times daily. Dx: E11.9   DropSafe Alcohol Prep 70 % Pads USE TO TEST BLOOD SUGAR 3 TIMES DAILY.   BIOFREEZE ROLL-ON EX Apply 1 application topically as needed (joint pain).   clopidogrel 75 MG tablet Commonly known as: PLAVIX TAKE 1 TABLET EVERY DAY What changed: when to take this   CRANBERRY PO Take 15,000 mg by mouth daily.   famotidine 20 MG tablet Commonly known as: PEPCID Take 1 tablet (20 mg total) by mouth daily.   Fish Oil 1000 MG Caps  Take 1,000 mg by mouth 2 (two) times a day.   gabapentin 100 MG capsule Commonly known as: NEURONTIN TAKE 1 CAPSULE TWICE DAILY   gemfibrozil 600 MG tablet Commonly known as: LOPID Take 1 tablet (600 mg total) by mouth 2 (two) times daily before a meal.   GINGER ROOT PO Take 1 tablet by mouth at bedtime.   glipiZIDE 5 MG tablet Commonly known as: GLUCOTROL TAKE 1 TABLET TWICE DAILY BEFORE MEALS   Glucose Control Soln Use to test blood sugar 3 times daily. Dx: E11.9   Insulin Pen Needle 32G X 4 MM Misc Used to inject insulin 1x daily.   INSULIN SYRINGE 1CC/30GX5/16" 30G X 5/16" 1 ML Misc Use to inject insulin 1 time per day.   levothyroxine 50 MCG tablet Commonly known as:  SYNTHROID TAKE 1 TABLET EVERY DAY BEFORE BREAKFAST   losartan 50 MG tablet Commonly known as: COZAAR TAKE 1 TABLET EVERY DAY   LUBRICATING EYE DROPS OP Place 1 drop into both eyes 3 (three) times daily.   metFORMIN 1000 MG tablet Commonly known as: GLUCOPHAGE TAKE 1 TABLET TWICE DAILY WITH MEALS What changed: when to take this   multivitamin tablet Take 0.5 tablets by mouth 2 (two) times daily.   niacin 500 MG ER tablet Commonly known as: VITAMIN B3 Take 1 tablet (500 mg total) by mouth at bedtime.   ondansetron 4 MG tablet Commonly known as: ZOFRAN Take 1 tablet (4 mg total) by mouth every 8 (eight) hours as needed for nausea or vomiting.   potassium chloride SA 20 MEQ tablet Commonly known as: KLOR-CON M TAKE 1 TABLET EVERY DAY   PROBIOTIC DAILY PO Take 1 capsule by mouth daily.   [name] FlexTouch 100 UNIT/ML FlexTouch Pen Generic drug: insulin degludec INJECT  60 UNITS EVERY DAY AT BEDTIME   zinc gluconate 50 MG tablet Take 50 mg by mouth at bedtime.               Discharge Care Instructions  (From admission, onward)           Start     Ordered   10/04/22 0000  No dressing needed        10/04/22 1021            Follow-up Information     Consuella Lose, MD Follow up.   Specialty: Neurosurgery Contact information: [address] [place] [address] [phone]                 Signed: Jairo Ben 10/04/2022, 10:21 AM

## 2022-10-06 ENCOUNTER — Encounter: Payer: Self-pay | Admitting: *Deleted

## 2022-10-06 ENCOUNTER — Inpatient Hospital Stay (HOSPITAL_COMMUNITY): Admission: RE | Admit: 2022-10-06 | Payer: Medicare HMO | Source: Ambulatory Visit

## 2022-10-06 ENCOUNTER — Telehealth: Payer: Self-pay | Admitting: *Deleted

## 2022-10-06 NOTE — Patient Outreach (Signed)
  Care Coordination TOC Note Transition Care Management Follow-up Telephone Call Date of discharge and from where: Wednesday, 10/04/22; [place] cerebral aneurysm/ coil embolization How have you been since you were released from the hospital? "I am doing just fine, I am not having any problems.  My husband is here helping me with anything I need help with" Any questions or concerns? No  Items Reviewed: Did the pt receive and understand the discharge instructions provided? Yes  Medications obtained and verified? Yes  patient declines medication review; confirms no changes made to regular medications post-recent hospital discharge Other? No  Any new allergies since your discharge? No  Dietary orders reviewed? Yes Do you have support at home? Yes  husband assisting with ADL's and iADL's as indicated/ needed  Home Care and Equipment/Supplies: Were home health services ordered? no If so, what is the name of the agency? N/A  Has the agency set up a time to come to the patient's home? not applicable Were any new equipment or medical supplies ordered?  No What is the name of the medical supply agency? N/A Were you able to get the supplies/equipment? not applicable Do you have any questions related to the use of the equipment or supplies? No N/A  Functional Questionnaire: (I = Independent and D = Dependent) ADLs: I  husband assisting with ADL's and iADL's as indicated/ needed  Bathing/Dressing- I  husband assisting with ADL's and iADL's as indicated/ needed  Meal Prep- I  husband assisting with ADL's and iADL's as indicated/ needed  Eating- I  Maintaining continence- I  Transferring/Ambulation- I  husband assisting with ADL's and iADL's as indicated/ needed  Managing Meds- I husband assisting with ADL's and iADL's as indicated/ needed   Follow up appointments reviewed:  PCP Hospital f/u appt confirmed? Yes  Scheduled to see PCP, Dr. Lorelei Pont on Monday 10/16/22 @ 2:00 pm [facility] f/u appt confirmed? Yes  Scheduled to see neurosurgeon, Dr. Kathyrn Sheriff on Tuesday 10/10/22 @ 2:00 pm Are transportation arrangements needed? No  If their condition worsens, is the pt aware to call PCP or go to the Emergency Dept.? Yes Was the patient provided with contact information for the PCP's office or ED? Yes Was to pt encouraged to call back with questions or concerns? Yes  SDOH assessments and interventions completed:   Yes  Care Coordination Interventions Activated:  No   Care Coordination Interventions:  No Care Coordination interventions needed at this time.   Encounter Outcome:  Pt. Visit Completed    Oneta Rack, RN, BSN, CCRN Alumnus RN CM Care Coordination/ Transition of [place] Management [phone]: direct office

## 2022-10-06 NOTE — Patient Outreach (Signed)
  Care Coordination TOC Note Transition Care Management Unsuccessful Follow-up Telephone Call  Date of discharge and from where:  Wednesday, 10/04/22; [place]- cerebral aneurysm/ coil embolization  Attempts:  1st Attempt  Reason for unsuccessful TCM follow-up call:  Left voice message  Oneta Rack, RN, BSN, CCRN Alumnus RN CM Care Coordination/ Transition of [place] Management [phone]: direct office

## 2022-10-13 ENCOUNTER — Telehealth: Payer: Self-pay

## 2022-10-13 NOTE — Patient Instructions (Incomplete)
Good to see you again today, I will be in touch with your labs

## 2022-10-13 NOTE — Progress Notes (Unsigned)
[place] at [facility] [address] [place], [place] [address] [phone]  Date:  10/16/2022   Name:  Mandy Kim   DOB:  01-01-1946   MRN:  [id number]  PCP:  Darreld Mclean, MD    Chief Complaint: No chief complaint on file.   History of Present Illness:  Mandy Kim is a 76 y.o. very pleasant female patient who presents with the following:  Patient seen today for 59-monthfollow-up visit Most recently seen by myself in June; at that time she had recently been admitted to the hospital with diarrhea which had caused acute renal insufficiency and also incidentally hypoxemia-it turned out she was positive for Campylobacter and C. Difficile  She was seen in the ER twice in October with her C. difficile, then she was admitted for elective coiling embolization of cerebral aneurysm on Halloween  Most recent A1c showed suboptimal control diabetes Most recent creatinine 1.4-1.5 Lab Results  Component Value Date   TSH 2.16 02/20/2022    Lab Results  Component Value Date   HGBA1C 8.5 (H) 09/29/2022   Plavix Pepcid Gabapentin 100 twice daily Gemfibrozil Levothyroxine Losartan Metformin Potassium 20 mill equivalents daily Tresiba 60 units  Patient Active Problem List   Diagnosis Date Noted   Cerebral aneurysm 10/03/2022   BCC (basal cell carcinoma), face 05/18/2022   Carpal tunnel syndrome, bilateral 05/18/2022   Colon polyp 05/18/2022   Herniated intervertebral disk 05/18/2022   Hypokalemia 05/18/2022   Intermittent claudication ([facility] 05/18/2022   Intracranial aneurysm 05/18/2022   Wears glasses 05/16/2022   Stress incontinence due to pelvic organ prolapse 05/16/2022   PONV (postoperative nausea and vomiting) 05/16/2022   OSA on CPAP 05/16/2022   History of diverticulitis of colon 05/16/2022   History of bilateral breast cancer 05/16/2022   History of basal cell carcinoma excision 05/16/2022   Hiatal  hernia 05/16/2022   Diverticulitis 05/16/2022   CKD (chronic kidney disease) 05/16/2022   Chronic cystitis 05/16/2022   Cancer ([facility] 05/16/2022   Anticoagulant long-term use 05/16/2022   Hypoxia 05/05/2022   Acute diarrhea 05/04/2022   Mixed hyperlipidemia due to type 2 diabetes mellitus ([facility] 05/04/2022   Essential hypertension 05/04/2022   GERD without esophagitis 05/04/2022   AKI (acute kidney injury) ([facility] 05/04/2022   Hypothyroidism 05/04/2022   Dark stools 05/04/2022   Myositis, unspecified 04/27/2020   Prolapse of female pelvic organs 05/24/2019   Cystocele with prolapse 05/23/2019   Acute respiratory failure with hypoxia ([facility]    Sepsis ([facility] 05/28/2017   Elevated LFTs 05/28/2017   Acute lower UTI 05/28/2017   Dehydration 05/28/2017   Peripheral neuropathy 07/27/2014   Rectocele 04/02/2013   Urge incontinence 04/02/2013   Urinary tract infection, site not specified 04/02/2013   Urinary urgency 04/02/2013   AAA (abdominal aortic aneurysm) ([facility] 02/12/2012   Fatty liver 02/12/2012   Breast cancer ([facility] 02/12/2012   Cervical cancer ([facility] 02/12/2012   Type 2 diabetes mellitus with diabetic polyneuropathy, with long-term current use of insulin ([facility] 02/12/2012   Hyperlipidemia 02/12/2012   S/P AAA (abdominal aortic aneurysm) repair 09/15/2011   Impaired glucose tolerance 06/26/2011   History of cervical cancer 2000   CHF (congestive heart failure) ([facility] 2000    Past Medical History:  Diagnosis Date   AAA (abdominal aortic aneurysm) ([facility] 02/12/2012   [place] done in approx 2013.    Annual  [place], stent has been in good position   Acute diarrhea 05/04/2022   Acute lower UTI 05/28/2017   Acute respiratory failure with hypoxia ([place])    AKI (acute kidney injury) ([place]) 05/04/2022   Anticoagulant long-term use    plavix   Arthritis    Breast cancer ([place]) 02/12/2012   Double mastectomy, 2005.  Now cured   Cancer  ([place])    CHF (congestive heart failure) ([place]) 2000   Chronic cystitis    Chronic kidney disease    Cystocele with prolapse 05/23/2019   Dark stools 05/04/2022   Dehydration 05/28/2017   Diverticulitis    Elevated LFTs 05/28/2017   Essential hypertension 05/04/2022   Fatty liver 02/12/2012   GERD (gastroesophageal reflux disease)    GERD without esophagitis 05/04/2022   Hiatal hernia    History of basal cell carcinoma excision    face/ nose right side   History of bilateral breast cancer followed by oncologist until 2008,  per pt no recurrence   right 2002  and left 2003 s/p  mastectomy with completed chemo and tamexifen therapy    History of cervical cancer 2000   s/p  TAH w/ BSO   History of diverticulitis of colon    Hyperlipidemia    Hypothyroidism    Hypoxia 05/05/2022   Mixed hyperlipidemia due to type 2 diabetes mellitus ([place]) 05/04/2022   OSA on CPAP    PAD (peripheral artery disease) (HCC)    Peripheral neuropathy    PONV (postoperative nausea and vomiting)    Prolapse of female pelvic organs 05/24/2019   S/P AAA (abdominal aortic aneurysm) repair 09/15/2011   Sepsis ([place]) 05/28/2017   Stress incontinence due to pelvic organ prolapse    Type 2 diabetes mellitus treated with insulin ([place])    followed by pcp   Type 2 diabetes mellitus with diabetic polyneuropathy, with long-term current use of insulin ([place]) 02/12/2012   Wears glasses     Past Surgical History:  Procedure Laterality Date   ABDOMINAL HYSTERECTOMY  2000   w/  BSO,  and Bladder tacking abdominally   CARPAL TUNNEL RELEASE Left 1999   CATARACT EXTRACTION W/ INTRAOCULAR LENS  IMPLANT, BILATERAL  2015   ENDOVASCULAR REPAIR/STENT GRAFT  09-15-2011    _0    INCONTINENCE SURGERY  2007  approx.   IR ANGIO INTRA EXTRACRAN SEL INTERNAL CAROTID UNI L MOD SED  04/11/2022   IR ANGIO INTRA EXTRACRAN SEL INTERNAL CAROTID UNI L MOD SED  10/03/2022   IR ANGIO VERTEBRAL SEL VERTEBRAL UNI R MOD SED  04/11/2022   IR  ANGIOGRAM FOLLOW UP STUDY  10/03/2022   IR ANGIOGRAM FOLLOW UP STUDY  10/03/2022   IR ANGIOGRAM FOLLOW UP STUDY  10/03/2022   IR NEURO EACH ADD'L AFTER BASIC UNI LEFT (MS)  10/03/2022   IR TRANSCATH/EMBOLIZ  10/03/2022   IR US GUIDE VASC ACCESS RIGHT  04/11/2022   MASTECTOMY Bilateral right 2002;  left 2003   lymph node dissection done only on right side   RADIOLOGY WITH ANESTHESIA N/A 10/03/2022   Procedure: 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;  Surgeon: Consuella Lose, MD;  Location: [facility];  Service: Radiology;  Laterality: N/A;   ROBOTIC ASSISTED LAPAROSCOPIC SACROCOLPOPEXY N/A 05/23/2019   Procedure: XI ROBOTIC ASSISTED LAPAROSCOPIC SACROCOLPOPEXY;  Surgeon: Ardis Hughs, MD;  Location: [facility];  Service: Urology;  Laterality: N/A;   TONSILLECTOMY  child   TYMPANOPLASTY Right 1970s   "had my ear drum replacement with a plastic  one"   WRIST GANGLION EXCISION Left 1998    Social History   Tobacco Use   Smoking status: Former    Years: 30.00    Types: Cigarettes    Quit date: 06/07/1999    Years since quitting: 23.3   Smokeless tobacco: Never  Vaping Use   Vaping Use: Never used  Substance Use Topics   Alcohol use: Never   Drug use: Never    Family History  Problem Relation Age of Onset   Cancer Mother    Pneumonia Mother    Thyroid disease Mother    Thyroid disease Sister    Obesity Daughter    Hypertension Daughter    Thyroid disease Son    Pneumonia Maternal Grandmother    Heart attack Maternal Grandfather    Diabetes Neg Hx     Allergies  Allergen Reactions   Aspirin     Per pt any pain medications - causes fluid build up    Bee Venom Swelling   Ibuprofen Swelling   Ozempic (0.25 Or 0.5 Mg-Dose) [Semaglutide(0.25 Or 0.58m-Dos)] Diarrhea and Nausea And Vomiting    Flatulence   Poison [place] Extract Itching and Swelling   Statins     "skin feels like bugs under the skin"   Trulicity [Dulaglutide] Nausea And Vomiting and  Other (See Comments)    Flatulance, "deathly sick"   Tylenol [Acetaminophen] Swelling   Victoza [Liraglutide] Other (See Comments)    Stomach upset issues     Medication list has been reviewed and updated.  Current Outpatient Medications on File Prior to Visit  Medication Sig Dispense Refill   ACCU-CHEK AVIVA PLUS test strip TEST BLOOD SUGAR THREE TIMES DAILY 300 strip 12   Accu-Chek Softclix Lancets lancets TEST BLOOD SUGAR THREE TIMES DAILY 300 each 1   Alcohol Swabs (DROPSAFE ALCOHOL PREP) 70 % PADS USE TO TEST BLOOD SUGAR 3 TIMES DAILY. 300 each 2   Alcohol Swabs PADS Use to test blood sugar 3 times daily. Dx: E11.9 300 each 2   Blood Glucose Calibration (GLUCOSE CONTROL) SOLN Use to test blood sugar 3 times daily. Dx: E11.9 1 each 2   Blood Glucose Monitoring Suppl (ACCU-CHEK AVIVA PLUS) w/Device KIT USE AS DIRECTED  TO TEST BLOOD GLUCOSE BID E11.9 1 kit 0   Carboxymethylcellul-Glycerin (LUBRICATING EYE DROPS OP) Place 1 drop into both eyes 3 (three) times daily.     clopidogrel (PLAVIX) 75 MG tablet TAKE 1 TABLET EVERY DAY (Patient taking differently: Take 75 mg by mouth every other day.) 90 tablet 10   CRANBERRY PO Take 15,000 mg by mouth daily.     famotidine (PEPCID) 20 MG tablet Take 1 tablet (20 mg total) by mouth daily. 90 tablet 1   gabapentin (NEURONTIN) 100 MG capsule TAKE 1 CAPSULE TWICE DAILY 180 capsule 1   gemfibrozil (LOPID) 600 MG tablet Take 1 tablet (600 mg total) by mouth 2 (two) times daily before a meal. 180 tablet 1   Ginger, Zingiber officinalis, (GINGER ROOT PO) Take 1 tablet by mouth at bedtime.     glipiZIDE (GLUCOTROL) 5 MG tablet TAKE 1 TABLET TWICE DAILY BEFORE MEALS 180 tablet 1   Insulin Pen Needle 32G X 4 MM MISC Used to inject insulin 1x daily. 100 each 4   Insulin Syringe-Needle U-100 (INSULIN SYRINGE 1CC/30GX5/16") 30G X 5/16" 1 ML MISC Use to inject insulin 1 time per day. 100 each 2   levothyroxine (SYNTHROID) 50 MCG tablet TAKE 1 TABLET EVERY DAY  BEFORE  BREAKFAST 90 tablet 1   losartan (COZAAR) 50 MG tablet TAKE 1 TABLET EVERY DAY 90 tablet 10   Menthol, Topical Analgesic, (BIOFREEZE ROLL-ON EX) Apply 1 application topically as needed (joint pain).     metFORMIN (GLUCOPHAGE) 1000 MG tablet TAKE 1 TABLET TWICE DAILY WITH MEALS (Patient taking differently: Take 1,000 mg by mouth daily.) 180 tablet 1   Multiple Vitamin (MULTIVITAMIN) tablet Take 0.5 tablets by mouth 2 (two) times daily.      niacin (NIASPAN) 500 MG CR tablet Take 1 tablet (500 mg total) by mouth at bedtime. (Patient not taking: Reported on 09/28/2022) 30 tablet 3   Omega-3 Fatty Acids (FISH OIL) 1000 MG CAPS Take 1,000 mg by mouth 2 (two) times a day.      ondansetron (ZOFRAN) 4 MG tablet Take 1 tablet (4 mg total) by mouth every 8 (eight) hours as needed for nausea or vomiting. 12 tablet 0   potassium chloride SA (KLOR-CON M) 20 MEQ tablet TAKE 1 TABLET EVERY DAY 90 tablet 10   Probiotic Product (PROBIOTIC DAILY PO) Take 1 capsule by mouth daily.     TRESIBA FLEXTOUCH 100 UNIT/ML FlexTouch Pen INJECT  60 UNITS EVERY DAY AT BEDTIME 60 mL 3   zinc gluconate 50 MG tablet Take 50 mg by mouth at bedtime.     No current facility-administered medications on file prior to visit.    Review of Systems:  As per HPI- otherwise negative.   Physical Examination: There were no vitals filed for this visit. There were no vitals filed for this visit. There is no height or weight on file to calculate BMI. Ideal Body Weight:    GEN: no acute distress. HEENT: Atraumatic, Normocephalic.  Ears and Nose: No external deformity. CV: RRR, No M/G/R. No JVD. No thrill. No extra heart sounds. PULM: CTA B, no wheezes, crackles, rhonchi. No retractions. No resp. distress. No accessory muscle use. ABD: S, NT, ND, +BS. No rebound. No HSM. EXTR: No c/c/e PSYCH: Normally interactive. Conversant.    Assessment and Plan: ***  Signed Lamar Blinks, MD

## 2022-10-13 NOTE — Telephone Encounter (Signed)
   Reason for call: ED-Follow up call   Patient visit on 09/16/2022 at [facility] was for Emesis  Have you been able to follow up with your primary care physician? - Yes  The patient was or was not able to obtain any needed medicine or equipment. - Was, Yes  Are there diet recommendations that you are having difficulty following? - No  Patient expresses understanding of discharge instructions and education provided has no other needs at this time.   [place] management  [place], [place]  Main Phone: [phone]  E-mail: [email or web address]  Website: [email or web address]

## 2022-10-14 ENCOUNTER — Other Ambulatory Visit: Payer: Self-pay | Admitting: Family Medicine

## 2022-10-14 DIAGNOSIS — E118 Type 2 diabetes mellitus with unspecified complications: Secondary | ICD-10-CM

## 2022-10-16 ENCOUNTER — Ambulatory Visit (INDEPENDENT_AMBULATORY_CARE_PROVIDER_SITE_OTHER): Payer: Medicare HMO | Admitting: Family Medicine

## 2022-10-16 VITALS — BP 122/80 | HR 60 | Temp 97.7°F | Resp 18 | Ht 64.17 in | Wt 178.8 lb

## 2022-10-16 DIAGNOSIS — Z6830 Body mass index (BMI) 30.0-30.9, adult: Secondary | ICD-10-CM | POA: Diagnosis not present

## 2022-10-16 DIAGNOSIS — E785 Hyperlipidemia, unspecified: Secondary | ICD-10-CM

## 2022-10-16 DIAGNOSIS — I671 Cerebral aneurysm, nonruptured: Secondary | ICD-10-CM | POA: Diagnosis not present

## 2022-10-16 DIAGNOSIS — N1832 Chronic kidney disease, stage 3b: Secondary | ICD-10-CM

## 2022-10-16 DIAGNOSIS — E039 Hypothyroidism, unspecified: Secondary | ICD-10-CM | POA: Diagnosis not present

## 2022-10-16 DIAGNOSIS — E118 Type 2 diabetes mellitus with unspecified complications: Secondary | ICD-10-CM

## 2022-10-16 DIAGNOSIS — I10 Essential (primary) hypertension: Secondary | ICD-10-CM | POA: Diagnosis not present

## 2022-10-16 NOTE — Telephone Encounter (Signed)
Appt later today.

## 2022-10-20 DIAGNOSIS — N3020 Other chronic cystitis without hematuria: Secondary | ICD-10-CM | POA: Diagnosis not present

## 2022-10-23 ENCOUNTER — Telehealth: Payer: Self-pay | Admitting: Family Medicine

## 2022-10-23 DIAGNOSIS — A09 Infectious gastroenteritis and colitis, unspecified: Secondary | ICD-10-CM

## 2022-10-23 NOTE — Telephone Encounter (Signed)
Okay for referral?

## 2022-10-23 NOTE — Telephone Encounter (Signed)
Referral placed.

## 2022-10-23 NOTE — Telephone Encounter (Signed)
Patient requesting referral for Gastroenterologist -Dr. Kathryne Hitch. [phone]

## 2022-11-13 DIAGNOSIS — N3020 Other chronic cystitis without hematuria: Secondary | ICD-10-CM | POA: Diagnosis not present

## 2022-11-15 DIAGNOSIS — M542 Cervicalgia: Secondary | ICD-10-CM | POA: Diagnosis not present

## 2022-11-24 ENCOUNTER — Other Ambulatory Visit: Payer: Self-pay | Admitting: Family Medicine

## 2022-11-24 DIAGNOSIS — E118 Type 2 diabetes mellitus with unspecified complications: Secondary | ICD-10-CM

## 2022-12-11 ENCOUNTER — Other Ambulatory Visit: Payer: Self-pay | Admitting: Family Medicine

## 2022-12-12 ENCOUNTER — Encounter (HOSPITAL_COMMUNITY): Payer: Self-pay | Admitting: Emergency Medicine

## 2022-12-12 ENCOUNTER — Emergency Department (HOSPITAL_COMMUNITY): Payer: Medicare HMO

## 2022-12-12 ENCOUNTER — Emergency Department (HOSPITAL_COMMUNITY)
Admission: EM | Admit: 2022-12-12 | Discharge: 2022-12-13 | Disposition: A | Discharge status: Home / Self Care | Payer: Medicare HMO | Attending: Emergency Medicine | Admitting: Emergency Medicine

## 2022-12-12 ENCOUNTER — Other Ambulatory Visit: Payer: Self-pay

## 2022-12-12 DIAGNOSIS — Z8679 Personal history of other diseases of the circulatory system: Secondary | ICD-10-CM | POA: Diagnosis not present

## 2022-12-12 DIAGNOSIS — R29818 Other symptoms and signs involving the nervous system: Secondary | ICD-10-CM | POA: Diagnosis not present

## 2022-12-12 DIAGNOSIS — R29810 Facial weakness: Secondary | ICD-10-CM | POA: Insufficient documentation

## 2022-12-12 DIAGNOSIS — Z794 Long term (current) use of insulin: Secondary | ICD-10-CM | POA: Insufficient documentation

## 2022-12-12 DIAGNOSIS — Z7902 Long term (current) use of antithrombotics/antiplatelets: Secondary | ICD-10-CM | POA: Diagnosis not present

## 2022-12-12 DIAGNOSIS — I6782 Cerebral ischemia: Secondary | ICD-10-CM | POA: Insufficient documentation

## 2022-12-12 DIAGNOSIS — I671 Cerebral aneurysm, nonruptured: Secondary | ICD-10-CM

## 2022-12-12 DIAGNOSIS — R519 Headache, unspecified: Secondary | ICD-10-CM | POA: Diagnosis not present

## 2022-12-12 DIAGNOSIS — G510 Bell's palsy: Secondary | ICD-10-CM | POA: Diagnosis not present

## 2022-12-12 LAB — CBC WITH DIFFERENTIAL/PLATELET
Abs Immature Granulocytes: 0.03 10*3/uL (ref 0.00–0.07)
Basophils Absolute: 0.1 10*3/uL (ref 0.0–0.1)
Basophils Relative: 1 %
Eosinophils Absolute: 0.2 10*3/uL (ref 0.0–0.5)
Eosinophils Relative: 3 %
HCT: 36.6 % (ref 36.0–46.0)
Hemoglobin: 13.5 g/dL (ref 12.0–15.0)
Immature Granulocytes: 1 %
Lymphocytes Relative: 65 %
Lymphs Abs: 4.1 10*3/uL — ABNORMAL HIGH (ref 0.7–4.0)
MCH: 33.6 pg (ref 26.0–34.0)
MCHC: 36.9 g/dL — ABNORMAL HIGH (ref 30.0–36.0)
MCV: 91 fL (ref 80.0–100.0)
Monocytes Absolute: 0.4 10*3/uL (ref 0.1–1.0)
Monocytes Relative: 6 %
Neutro Abs: 1.5 10*3/uL — ABNORMAL LOW (ref 1.7–7.7)
Neutrophils Relative %: 24 %
Platelets: 301 10*3/uL (ref 150–400)
RBC: 4.02 MIL/uL (ref 3.87–5.11)
RDW: 15.2 % (ref 11.5–15.5)
WBC: 6.3 10*3/uL (ref 4.0–10.5)
nRBC: 0 % (ref 0.0–0.2)

## 2022-12-12 LAB — I-STAT CHEM 8, ED
BUN: 34 mg/dL — ABNORMAL HIGH (ref 8–23)
Calcium, Ion: 1.17 mmol/L (ref 1.15–1.40)
Chloride: 108 mmol/L (ref 98–111)
Creatinine, Ser: 1.4 mg/dL — ABNORMAL HIGH (ref 0.44–1.00)
Glucose, Bld: 165 mg/dL — ABNORMAL HIGH (ref 70–99)
HCT: 38 % (ref 36.0–46.0)
Hemoglobin: 12.9 g/dL (ref 12.0–15.0)
Potassium: 4.4 mmol/L (ref 3.5–5.1)
Sodium: 139 mmol/L (ref 135–145)
TCO2: 24 mmol/L (ref 22–32)

## 2022-12-12 LAB — I-STAT VENOUS BLOOD GAS, ED
Acid-Base Excess: 1 mmol/L (ref 0.0–2.0)
Bicarbonate: 24.1 mmol/L (ref 20.0–28.0)
Calcium, Ion: 1.11 mmol/L — ABNORMAL LOW (ref 1.15–1.40)
HCT: 36 % (ref 36.0–46.0)
Hemoglobin: 12.2 g/dL (ref 12.0–15.0)
O2 Saturation: 99 %
Potassium: 4.7 mmol/L (ref 3.5–5.1)
Sodium: 138 mmol/L (ref 135–145)
TCO2: 25 mmol/L (ref 22–32)
pCO2, Ven: 32.7 mmHg — ABNORMAL LOW (ref 44–60)
pH, Ven: 7.475 — ABNORMAL HIGH (ref 7.25–7.43)
pO2, Ven: 127 mmHg — ABNORMAL HIGH (ref 32–45)

## 2022-12-12 LAB — COMPREHENSIVE METABOLIC PANEL
ALT: 56 U/L — ABNORMAL HIGH (ref 0–44)
AST: 64 U/L — ABNORMAL HIGH (ref 15–41)
Albumin: 4 g/dL (ref 3.5–5.0)
Alkaline Phosphatase: 30 U/L — ABNORMAL LOW (ref 38–126)
Anion gap: 19 — ABNORMAL HIGH (ref 5–15)
BUN: 26 mg/dL — ABNORMAL HIGH (ref 8–23)
CO2: 15 mmol/L — ABNORMAL LOW (ref 22–32)
Calcium: 9.6 mg/dL (ref 8.9–10.3)
Chloride: 104 mmol/L (ref 98–111)
Creatinine, Ser: 1.53 mg/dL — ABNORMAL HIGH (ref 0.44–1.00)
GFR, Estimated: 35 mL/min — ABNORMAL LOW (ref >60)
Glucose, Bld: 246 mg/dL — ABNORMAL HIGH (ref 70–99)
Potassium: 5 mmol/L (ref 3.5–5.1)
Sodium: 138 mmol/L (ref 135–145)
Total Bilirubin: 0.9 mg/dL (ref 0.3–1.2)
Total Protein: 7.2 g/dL (ref 6.5–8.1)

## 2022-12-12 LAB — PROTIME-INR
INR: 1.1 (ref 0.8–1.2)
Prothrombin Time: 14.2 seconds (ref 11.4–15.2)

## 2022-12-12 MED ORDER — DIPHENHYDRAMINE HCL 50 MG/ML IJ SOLN
12.5000 mg | Freq: Once | INTRAMUSCULAR | Status: AC
  Administered 2022-12-12: 12.5 mg via INTRAVENOUS
  Filled 2022-12-12: qty 1

## 2022-12-12 MED ORDER — SODIUM CHLORIDE 0.9 % IV BOLUS
1000.0000 mL | Freq: Once | INTRAVENOUS | Status: AC
  Administered 2022-12-12: 1000 mL via INTRAVENOUS

## 2022-12-12 MED ORDER — VALACYCLOVIR HCL 1 G PO TABS: 1000.0000 mg | ORAL_TABLET | Freq: Three times a day (TID) | ORAL | 0 refills | Status: AC

## 2022-12-12 MED ORDER — PREDNISONE 20 MG PO TABS: 20.0000 mg | ORAL_TABLET | Freq: Every day | ORAL | 0 refills | Status: AC

## 2022-12-12 MED ORDER — METOCLOPRAMIDE HCL 5 MG/ML IJ SOLN
10.0000 mg | Freq: Once | INTRAMUSCULAR | Status: AC
  Administered 2022-12-12: 10 mg via INTRAVENOUS
  Filled 2022-12-12: qty 2

## 2022-12-12 NOTE — ED Provider Notes (Signed)
[facility] EMERGENCY DEPARTMENT Provider Note   CSN: [id number] Arrival date & time: 12/12/22  1509     History  Chief Complaint  Patient presents with   Facial Droop    Mandy Kim is a 77 y.o. female history of cerebral aneurysm status post coiling, here presenting with left facial droop.  She states that she noticed left facial droop around 9 PM yesterday.  Patient states that her left eye appears to be dry.  Patient does have history of chickenpox as a child.  Patient also has a history of shingles.  Patient states that she has left posterior headache.  She has cerebral aneurysm that was recently coiled.  Denies any history of stroke in the past.  The history is provided by the patient.       Home Medications Prior to Admission medications   Medication Sig Start Date End Date Taking? Authorizing Provider  ACCU-CHEK AVIVA PLUS test strip TEST BLOOD SUGAR THREE TIMES DAILY 05/15/22   Copland, Gay Filler, MD  Accu-Chek Softclix Lancets lancets TEST BLOOD SUGAR THREE TIMES DAILY 07/20/21   Copland, Gay Filler, MD  Alcohol Swabs (DROPSAFE ALCOHOL PREP) 70 % PADS USE TO TEST BLOOD SUGAR 3 TIMES DAILY. 07/20/21   Copland, Gay Filler, MD  Alcohol Swabs PADS Use to test blood sugar 3 times daily. Dx: E11.9 01/14/19   Copland, Gay Filler, MD  Blood Glucose Calibration (GLUCOSE CONTROL) SOLN Use to test blood sugar 3 times daily. Dx: E11.9 12/20/15   Copland, Gay Filler, MD  Blood Glucose Monitoring Suppl (ACCU-CHEK AVIVA PLUS) w/Device KIT USE AS DIRECTED  TO TEST BLOOD GLUCOSE BID E11.9 05/11/21   Copland, Gay Filler, MD  Carboxymethylcellul-Glycerin (LUBRICATING EYE DROPS OP) Place 1 drop into both eyes 3 (three) times daily.    [provider]  clopidogrel (PLAVIX) 75 MG tablet TAKE 1 TABLET EVERY DAY Patient taking differently: Take 75 mg by mouth every other day. 09/18/22   Copland, Gay Filler, MD  famotidine (PEPCID) 20 MG tablet TAKE 1 TABLET EVERY DAY 12/11/22    Copland, Gay Filler, MD  gabapentin (NEURONTIN) 100 MG capsule TAKE 1 CAPSULE TWICE DAILY 05/15/22   Copland, Gay Filler, MD  gemfibrozil (LOPID) 600 MG tablet Take 1 tablet (600 mg total) by mouth 2 (two) times daily before a meal. 08/14/22   Copland, Gay Filler, MD  Ginger, Zingiber officinalis, (GINGER ROOT PO) Take 1 tablet by mouth at bedtime.    [provider]  glipiZIDE (GLUCOTROL) 5 MG tablet TAKE 1 TABLET TWICE DAILY BEFORE MEALS 06/21/22   Copland, Gay Filler, MD  Insulin Pen Needle 32G X 4 MM MISC Used to inject insulin 1x daily. 10/18/17   Renato Shin, MD  Insulin Syringe-Needle U-100 (INSULIN SYRINGE 1CC/30GX5/16") 30G X 5/16" 1 ML MISC Use to inject insulin 1 time per day. 05/22/17   Renato Shin, MD  levothyroxine (SYNTHROID) 50 MCG tablet TAKE 1 TABLET EVERY DAY BEFORE BREAKFAST 05/15/22   Copland, Gay Filler, MD  losartan (COZAAR) 50 MG tablet TAKE 1 TABLET EVERY DAY 09/19/22   Copland, Gay Filler, MD  Menthol, Topical Analgesic, (BIOFREEZE ROLL-ON EX) Apply 1 application topically as needed (joint pain).    [provider]  metFORMIN (GLUCOPHAGE) 1000 MG tablet Take 1 tablet (1,000 mg total) by mouth 2 (two) times daily with a meal. 10/16/22   Copland, Gay Filler, MD  niacin (NIASPAN) 500 MG CR tablet Take 1 tablet (500 mg total) by mouth at bedtime.  08/25/15   Copland, Gay Filler, MD  Omega-3 Fatty Acids (FISH OIL) 1000 MG CAPS Take 1,000 mg by mouth 2 (two) times a day.     [provider]  ondansetron (ZOFRAN) 4 MG tablet Take 1 tablet (4 mg total) by mouth every 8 (eight) hours as needed for nausea or vomiting. 09/16/22   Domenic Moras, PA-C  potassium chloride SA (KLOR-CON M) 20 MEQ tablet TAKE 1 TABLET EVERY DAY 09/25/22   Copland, Gay Filler, MD  Probiotic Product (PROBIOTIC DAILY PO) Take 1 capsule by mouth daily.    [provider]  TRESIBA FLEXTOUCH 100 UNIT/ML FlexTouch Pen INJECT 60 UNITS UNDER THE SKIN AT BEDTIME 11/24/22   Copland, Gay Filler, MD   zinc gluconate 50 MG tablet Take 50 mg by mouth at bedtime.    [provider]      Allergies    Aspirin, Bee venom, Ibuprofen, Ozempic (0.25 or 0.5 mg-dose) [semaglutide(0.25 or 0.'5mg'$ -dos)], Poison oak extract, Statins, Trulicity [dulaglutide], Tylenol [acetaminophen], and Victoza [liraglutide]    Review of Systems   Review of Systems  Neurological:  Positive for facial asymmetry and headaches.  All other systems reviewed and are negative.   Physical Exam Updated Vital Signs BP (!) 155/53   Pulse 67   Temp 97.9 F (36.6 C) (Oral)   Resp 17   SpO2 95%  Physical Exam Vitals and nursing note reviewed.  Constitutional:      Appearance: Normal appearance.  HENT:     Head: Normocephalic.     Nose: Nose normal.     Mouth/Throat:     Mouth: Mucous membranes are moist.  Eyes:     Extraocular Movements: Extraocular movements intact.     Pupils: Pupils are equal, round, and reactive to light.  Cardiovascular:     Rate and Rhythm: Normal rate and regular rhythm.     Pulses: Normal pulses.     Heart sounds: Normal heart sounds.  Pulmonary:     Effort: Pulmonary effort is normal.     Breath sounds: Normal breath sounds.  Abdominal:     General: Abdomen is flat.     Palpations: Abdomen is soft.  Musculoskeletal:        General: Normal range of motion.     Cervical back: Normal range of motion and neck supple.  Skin:    General: Skin is warm.     Capillary Refill: Capillary refill takes less than 2 seconds.  Neurological:     Mental Status: She is alert.     Comments: Patient appears to have left facial nerve palsy with left facial droop and also unable to close the left eye.  Patient has normal strength bilateral arms and legs.  Psychiatric:        Mood and Affect: Mood normal.        Behavior: Behavior normal.     ED Results / Procedures / Treatments   Labs (all labs ordered are listed, but only abnormal results are displayed) Labs Reviewed  CBC WITH  DIFFERENTIAL/PLATELET - Abnormal; Notable for the following components:      Result Value   MCHC 36.9 (*)    Neutro Abs 1.5 (*)    Lymphs Abs 4.1 (*)    All other components within normal limits  COMPREHENSIVE METABOLIC PANEL - Abnormal; Notable for the following components:   CO2 15 (*)    Glucose, Bld 246 (*)    BUN 26 (*)    Creatinine, Ser 1.53 (*)  AST 64 (*)    ALT 56 (*)    Alkaline Phosphatase 30 (*)    GFR, Estimated 35 (*)    Anion gap 19 (*)    All other components within normal limits  I-STAT CHEM 8, ED - Abnormal; Notable for the following components:   BUN 34 (*)    Creatinine, Ser 1.40 (*)    Glucose, Bld 165 (*)    All other components within normal limits  I-STAT VENOUS BLOOD GAS, ED - Abnormal; Notable for the following components:   pH, Ven 7.475 (*)    pCO2, Ven 32.7 (*)    pO2, Ven 127 (*)    Calcium, Ion 1.11 (*)    All other components within normal limits  PROTIME-INR  URINALYSIS, ROUTINE W REFLEX MICROSCOPIC  I-STAT CHEM 8, ED    EKG None  Radiology MR ANGIO HEAD WO CONTRAST  Result Date: 12/12/2022 CLINICAL DATA:  Initial evaluation for neuro deficit, stroke suspected. EXAM: MRI HEAD WITHOUT CONTRAST MRA HEAD WITHOUT CONTRAST TECHNIQUE: Multiplanar, multi-echo pulse sequences of the brain and surrounding structures were acquired without intravenous contrast. Angiographic images of the Circle of Willis were acquired using MRA technique without intravenous contrast. COMPARISON:  CT from earlier the same day as well as prior study from 02/22/2022. FINDINGS: MRI HEAD FINDINGS Brain: Cerebral volume within normal limits. Scattered patchy T2/FLAIR hyperintensity involving the supratentorial cerebral white matter, most characteristic of chronic microvascular ischemic disease, mild for age. No evidence for acute or subacute ischemia. Gray-white matter differentiation maintained. No areas of chronic cortical infarction. No acute intracranial hemorrhage. Few  punctate chronic micro hemorrhages noted, likely small vessel/hypertensive in nature. No mass lesion, midline shift or mass effect. No hydrocephalus or extra-axial fluid collection. Pituitary gland and suprasellar region within normal limits. Vascular: Major intracranial vascular flow voids are maintained. Skull and upper cervical spine: Craniocervical junction within normal limits. Bone marrow signal intensity normal. No scalp soft tissue abnormality. Sinuses/Orbits: Prior bilateral ocular lens replacement. Paranasal sinuses are clear. Trace right mastoid effusion noted, of doubtful significance. Other: None. MRA HEAD FINDINGS Anterior circulation: Both internal carotid arteries patent through the siphons without significant stenosis. Sequelae of prior coil embolization of left PCOM aneurysm. Trace 2 mm focus of filling of the aneurysm neck at the base of the coil pack is seen (series 7, image 104). A1 segments patent bilaterally. Normal anterior communicating complex. Anterior cerebral arteries patent without stenosis. No M1 stenosis or occlusion. 3 mm laterally projecting outpouching extending from the right MCA bifurcation, consistent with a small aneurysm (series 1036, image 5). This is relatively stable from prior. No proximal MCA branch occlusion. Distal MCA branches perfused and symmetric. Posterior circulation: Both V4 segments patent without stenosis. Left vertebral artery dominant. Both PICA patent. Basilar patent without stenosis. Superior cerebral arteries patent bilaterally. Both PCAs primarily supplied via the basilar and are widely patent to their distal aspects. Small patent left posterior communicating artery noted. Anatomic variants: None significant. IMPRESSION: MRI HEAD IMPRESSION: 1. No acute intracranial abnormality. 2. Mild chronic microvascular ischemic disease for age. MRA HEAD IMPRESSION: 1. Sequelae of prior coil embolization of left PCOM aneurysm. Trace 2 mm focus of residual and/or  recurrent filling of the aneurysm neck at the base of the coil pack. 2. 3 mm right MCA bifurcation aneurysm, stable. 3. Otherwise negative intracranial MRA. No other large vessel occlusion or hemodynamically significant stenosis. Electronically Signed   By: Jeannine Boga M.D.   On: 12/12/2022 22:56   MR BRAIN  WO CONTRAST  Result Date: 12/12/2022 CLINICAL DATA:  Initial evaluation for neuro deficit, stroke suspected. EXAM: MRI HEAD WITHOUT CONTRAST MRA HEAD WITHOUT CONTRAST TECHNIQUE: Multiplanar, multi-echo pulse sequences of the brain and surrounding structures were acquired without intravenous contrast. Angiographic images of the Circle of Willis were acquired using MRA technique without intravenous contrast. COMPARISON:  CT from earlier the same day as well as prior study from 02/22/2022. FINDINGS: MRI HEAD FINDINGS Brain: Cerebral volume within normal limits. Scattered patchy T2/FLAIR hyperintensity involving the supratentorial cerebral white matter, most characteristic of chronic microvascular ischemic disease, mild for age. No evidence for acute or subacute ischemia. Gray-white matter differentiation maintained. No areas of chronic cortical infarction. No acute intracranial hemorrhage. Few punctate chronic micro hemorrhages noted, likely small vessel/hypertensive in nature. No mass lesion, midline shift or mass effect. No hydrocephalus or extra-axial fluid collection. Pituitary gland and suprasellar region within normal limits. Vascular: Major intracranial vascular flow voids are maintained. Skull and upper cervical spine: Craniocervical junction within normal limits. Bone marrow signal intensity normal. No scalp soft tissue abnormality. Sinuses/Orbits: Prior bilateral ocular lens replacement. Paranasal sinuses are clear. Trace right mastoid effusion noted, of doubtful significance. Other: None. MRA HEAD FINDINGS Anterior circulation: Both internal carotid arteries patent through the siphons without  significant stenosis. Sequelae of prior coil embolization of left PCOM aneurysm. Trace 2 mm focus of filling of the aneurysm neck at the base of the coil pack is seen (series 7, image 104). A1 segments patent bilaterally. Normal anterior communicating complex. Anterior cerebral arteries patent without stenosis. No M1 stenosis or occlusion. 3 mm laterally projecting outpouching extending from the right MCA bifurcation, consistent with a small aneurysm (series 1036, image 5). This is relatively stable from prior. No proximal MCA branch occlusion. Distal MCA branches perfused and symmetric. Posterior circulation: Both V4 segments patent without stenosis. Left vertebral artery dominant. Both PICA patent. Basilar patent without stenosis. Superior cerebral arteries patent bilaterally. Both PCAs primarily supplied via the basilar and are widely patent to their distal aspects. Small patent left posterior communicating artery noted. Anatomic variants: None significant. IMPRESSION: MRI HEAD IMPRESSION: 1. No acute intracranial abnormality. 2. Mild chronic microvascular ischemic disease for age. MRA HEAD IMPRESSION: 1. Sequelae of prior coil embolization of left PCOM aneurysm. Trace 2 mm focus of residual and/or recurrent filling of the aneurysm neck at the base of the coil pack. 2. 3 mm right MCA bifurcation aneurysm, stable. 3. Otherwise negative intracranial MRA. No other large vessel occlusion or hemodynamically significant stenosis. Electronically Signed   By: Jeannine Boga M.D.   On: 12/12/2022 22:56   CT Head Wo Contrast  Result Date: 12/12/2022 CLINICAL DATA:  Headache left-sided facial droop EXAM: CT HEAD WITHOUT CONTRAST TECHNIQUE: Contiguous axial images were obtained from the base of the skull through the vertex without intravenous contrast. RADIATION DOSE REDUCTION: This exam was performed according to the departmental dose-optimization program which includes automated exposure control, adjustment of the  mA and/or kV according to patient size and/or use of iterative reconstruction technique. COMPARISON:  MRI 02/22/2022 FINDINGS: Brain: No acute territorial infarction, hemorrhage or intracranial mass. Mild atrophy and chronic small vessel ischemic changes of the white matter. Nonenlarged ventricles. Vascular: Interval left aneurysm coiling with artifact. Skull: No fracture Sinuses/Orbits: No acute finding. Other: None IMPRESSION: 1. No CT evidence for acute intracranial abnormality. 2. Atrophy and chronic small vessel ischemic changes of the white matter. Electronically Signed   By: Donavan Foil M.D.   On: 12/12/2022 15:41  Procedures Procedures    Medications Ordered in ED Medications  metoCLOPramide (REGLAN) injection 10 mg (10 mg Intravenous Given 12/12/22 2259)  diphenhydrAMINE (BENADRYL) injection 12.5 mg (12.5 mg Intravenous Given 12/12/22 2259)  sodium chloride 0.9 % bolus 1,000 mL (1,000 mLs Intravenous New Bag/Given 12/12/22 2258)    ED Course/ Medical Decision Making/ A&P                           Medical Decision Making Mandy Kim is a 77 y.o. female here presenting with left facial droop.  I think she likely had Bell's palsy.  Given her history of cerebral aneurysm, will get MRI and MRA.  Will get lab work as well and give migraine cocktail  11:09 PM I reviewed patient's labs and independently interpreted MRI.  Patient's initial CMP was hemolyzed.  The repeat i-STAT Chem-8 showed normal bicarb and normal potassium.  MRI showed no stroke and the aneurysm was successfully coiled.  Patient's headache improved.  I think she has Bell's palsy.  Patient will be discharged home with course of steroids and Valtrex.  She is diabetic so I told her that her blood sugar may be elevated.   Problems Addressed: Bell's palsy: acute illness or injury Cerebral aneurysm: acute illness or injury  Amount and/or Complexity of Data Reviewed Labs: ordered. Decision-making details documented in ED  Course. Radiology: ordered and independent interpretation performed. Decision-making details documented in ED Course.  Risk Prescription drug management.    Final Clinical Impression(s) / ED Diagnoses Final diagnoses:  None    Rx / DC Orders ED Discharge Orders     None         Drenda Freeze, MD 12/12/22 2314

## 2022-12-12 NOTE — ED Notes (Signed)
Patient transported to MRI 

## 2022-12-12 NOTE — Discharge Instructions (Signed)
As we discussed, you have Bell's palsy.  I have prescribed 5 days of prednisone and I also prescribed Valtrex  Your cerebral aneurysm is stable and successfully coiled  Your blood sugar may be elevated and you may need to give yourself additional doses of insulin  Your left eye will likely be dry so please use artificial tears during the day and please tape your eye at night  Please follow-up with your doctor this week  I also recommend that you follow-up with an eye doctor  Return to ER if you have worse facial droop, trouble speaking, weakness, headache, left eye blurry vision.

## 2022-12-12 NOTE — ED Triage Notes (Signed)
Pt arrives pov c/o headache, left sided facial droop, vision changes on left side, and left sided numbness starting yesterday at approx 9pm. LKW unknown.

## 2022-12-12 NOTE — ED Provider Triage Note (Signed)
Emergency Medicine Provider Triage Evaluation Note  Mandy Kim , a 77 y.o. female  was evaluated in triage.  Pt complains of facial weakness and bad headache.  Headache started today. Pt has a histroy of Aneurism   Review of Systems  Positive: Blurred vision Negative: fever  Physical Exam  BP (!) 160/76   Pulse 72   Temp 97.7 F (36.5 C) (Oral)   Resp 16   SpO2 91%  Gen:   Awake, left facial droop left eye redness  Resp:  Normal effort  MSK:   Moves extremities without difficulty  Other:    Medical Decision Making  Medically screening exam initiated at 3:19 PM.  Appropriate orders placed.  Mandy Kim was informed that the remainder of the evaluation will be completed by another provider, this initial triage assessment does not replace that evaluation, and the importance of remaining in the ED until their evaluation is complete.     Mandy Kim, [place] 12/12/22 1521

## 2022-12-18 ENCOUNTER — Telehealth: Payer: Self-pay | Admitting: Family Medicine

## 2022-12-18 NOTE — Telephone Encounter (Signed)
Pt was dx with Bells Palsy on 12/12/21 at the ER. Should pt return to the ER to evaluate for stroke?

## 2022-12-18 NOTE — Telephone Encounter (Signed)
Patient called this morning and said she has or suspects bells palsy. Hard to understand due to drooping. Entire left side of face is drooping, her eye won't close. Provider does not have anything available this week. Please call patient to advise what she should do.

## 2022-12-18 NOTE — Telephone Encounter (Signed)
Gave her a call back- she was dx with Bell's palsy on the 9th She does not note any worsening but not any better yet either She is using drops for her left eye as it is hard for her to close No sx below the neck  Made her an appt to see me on Wednesday to check in

## 2022-12-19 NOTE — Patient Instructions (Addendum)
Good to see you today, I am sorry that you have Bell's palsy!  Continue to keep your eye moisturized with drops or moisturizing gel Wear your eye patch when you can, and your eye mask at night to keep your eye from drying out  I gave you another 6 days of prednisone   Start back on gabapentin as this may help with your pain   Lets check back in 3 to 4 weeks, we can follow-up in your A1c at that time and check on your Bells

## 2022-12-19 NOTE — Progress Notes (Signed)
[facility] [place], [place], [place] [address] [phone]  Date:  12/20/2022   Name:  Mandy Kim   DOB:  02/29/1946   MRN:  [id number]  PCP:  Darreld Mclean, MD    Chief Complaint: bell"s palsy (Discuss Bell"s Palsy)   History of Present Illness:  Mandy Kim is a 77 y.o. very pleasant female patient who presents with the following:  Patient seen today for follow-up of Bell's palsy Most recent visit with myself was in November History of poorly controlled diabetes, fatty liver, AAA status post repair 2013, hyperlipidemia, cervical cancer status post hysterectomy year 2000, bilateral breast cancer status post double mastectomy  She has struggled with C. difficile recently She had an elective coil embolization of an LICA aneurysm at the end of October-she stayed overnight in the hospital and was released to home  She presented to the ER on 1/9 with concern of left-sided facial droop for about 24 hours The ER physician obtained an MRI and MRA of her head as well as a CT head-all looked okay, she was released to home with diagnosis of Bell's palsy, treated with steroids and Valtrex  I spoke with her 2 days ago, made this appointment for follow-up.  She is using eyedrops to keep her left eye moisturized So far she has not seen any improvement of her Bell's palsy  She notes a feeling of pressure in her inner ear and notes a HA behind her left ear She is taking benadryl for pain/ headache  She was given just 20 mg of pred for 5 days in defernmce to her poorly controlled DM  She is using 60 units of insulin daily right now  She ran out of her gabapentin and is not taking right now- will restart this for her, it may help with her HA   Lab Results  Component Value Date   HGBA1C 8.5 (H) 09/29/2022    Patient Active Problem List   Diagnosis Date Noted   Cerebral aneurysm 10/03/2022   BCC (basal cell  carcinoma), face 05/18/2022   Carpal tunnel syndrome, bilateral 05/18/2022   Colon polyp 05/18/2022   Herniated intervertebral disk 05/18/2022   Hypokalemia 05/18/2022   Intermittent claudication ([place]) 05/18/2022   Intracranial aneurysm 05/18/2022   Wears glasses 05/16/2022   Stress incontinence due to pelvic organ prolapse 05/16/2022   PONV (postoperative nausea and vomiting) 05/16/2022   OSA on CPAP 05/16/2022   History of diverticulitis of colon 05/16/2022   History of bilateral breast cancer 05/16/2022   History of basal cell carcinoma excision 05/16/2022   Hiatal hernia 05/16/2022   Diverticulitis 05/16/2022   CKD (chronic kidney disease) 05/16/2022   Chronic cystitis 05/16/2022   Cancer ([place]) 05/16/2022   Anticoagulant long-term use 05/16/2022   Hypoxia 05/05/2022   Acute diarrhea 05/04/2022   Mixed hyperlipidemia due to type 2 diabetes mellitus ([place]) 05/04/2022   Essential hypertension 05/04/2022   GERD without esophagitis 05/04/2022   AKI (acute kidney injury) ([place]) 05/04/2022   Hypothyroidism 05/04/2022   Dark stools 05/04/2022   Myositis, unspecified 04/27/2020   Prolapse of female pelvic organs 05/24/2019   Cystocele with prolapse 05/23/2019   Acute respiratory failure with hypoxia ([place])    Sepsis ([place]) 05/28/2017   Elevated LFTs 05/28/2017   Acute lower UTI 05/28/2017   Dehydration 05/28/2017   Peripheral neuropathy 07/27/2014   Rectocele 04/02/2013   Urge incontinence 04/02/2013  Urinary tract infection, site not specified 04/02/2013   Urinary urgency 04/02/2013   AAA (abdominal aortic aneurysm) ([place]) 02/12/2012   Fatty liver 02/12/2012   Breast cancer ([place]) 02/12/2012   Cervical cancer ([place]) 02/12/2012   Type 2 diabetes mellitus with diabetic polyneuropathy, with long-term current use of insulin ([place]) 02/12/2012   Hyperlipidemia 02/12/2012   S/P AAA (abdominal aortic aneurysm) repair 09/15/2011   Impaired glucose tolerance 06/26/2011   History of  cervical cancer 2000   CHF (congestive heart failure) ([place]) 2000    Past Medical History:  Diagnosis Date   AAA (abdominal aortic aneurysm) ([place]) 02/12/2012   She is a pt of vascular surgery in [place]- she had a repair done in approx 2013.    Annual [place], stent has been in good position   Acute diarrhea 05/04/2022   Acute lower UTI 05/28/2017   Acute respiratory failure with hypoxia (H[facility])    AKI (acute kidney injury) ([place]) 05/04/2022   Anticoagulant long-term use    plavix   Arthritis    Breast cancer ([place]) 02/12/2012   Double mastectomy, 2005.  Now cured   Cancer ([place])    CHF (congestive heart failure) ([place]) 2000   Chronic cystitis    Chronic kidney disease    Cystocele with prolapse 05/23/2019   Dark stools 05/04/2022   Dehydration 05/28/2017   Diverticulitis    Elevated LFTs 05/28/2017   Essential hypertension 05/04/2022   Fatty liver 02/12/2012   GERD (gastroesophageal reflux disease)    GERD without esophagitis 05/04/2022   Hiatal hernia    History of basal cell carcinoma excision    face/ nose right side   History of bilateral breast cancer followed by oncologist until 2008,  per pt no recurrence   right 2002  and left 2003 s/p  mastectomy with completed chemo and tamexifen therapy    History of cervical cancer 2000   s/p  TAH w/ BSO   History of diverticulitis of colon    Hyperlipidemia    Hypothyroidism    Hypoxia 05/05/2022   Mixed hyperlipidemia due to type 2 diabetes mellitus ([place]) 05/04/2022   OSA on CPAP    PAD (peripheral artery disease) ([facility])    Peripheral neuropathy    PONV (postoperative nausea and vomiting)    Prolapse of female pelvic organs 05/24/2019   S/P AAA (abdominal aortic aneurysm) repair 09/15/2011   Sepsis ([place]) 05/28/2017   Stress incontinence due to pelvic organ prolapse    Type 2 diabetes mellitus treated with insulin ([place])    followed by pcp   Type 2 diabetes mellitus with diabetic polyneuropathy, with long-term current use of  insulin ([place]) 02/12/2012   Wears glasses     Past Surgical History:  Procedure Laterality Date   ABDOMINAL HYSTERECTOMY  2000   w/  BSO,  and Bladder tacking abdominally   CARPAL TUNNEL RELEASE Left 1999   CATARACT EXTRACTION W/ INTRAOCULAR LENS  IMPLANT, BILATERAL  2015   ENDOVASCULAR REPAIR/STENT GRAFT  09-15-2011    '@[facility]'$    INCONTINENCE SURGERY  2007  approx.   IR ANGIO INTRA EXTRACRAN SEL INTERNAL CAROTID UNI L MOD SED  04/11/2022   IR ANGIO INTRA EXTRACRAN SEL INTERNAL CAROTID UNI L MOD SED  10/03/2022   IR ANGIO VERTEBRAL SEL VERTEBRAL UNI R MOD SED  04/11/2022   IR ANGIOGRAM FOLLOW UP STUDY  10/03/2022   IR ANGIOGRAM FOLLOW UP STUDY  10/03/2022   IR ANGIOGRAM FOLLOW UP STUDY  10/03/2022   IR NEURO EACH  ADD'L AFTER BASIC UNI LEFT ([facility])  10/03/2022   IR TRANSCATH/EMBOLIZ  10/03/2022   IR US GUIDE VASC ACCESS RIGHT  04/11/2022   MASTECTOMY Bilateral right 2002;  left 2003   lymph node dissection done only on right side   RADIOLOGY WITH ANESTHESIA N/A 10/03/2022   Procedure: 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;  Surgeon: Consuella Lose, MD;  Location: [facility];  Service: Radiology;  Laterality: N/A;   ROBOTIC ASSISTED LAPAROSCOPIC SACROCOLPOPEXY N/A 05/23/2019   Procedure: XI ROBOTIC ASSISTED LAPAROSCOPIC SACROCOLPOPEXY;  Surgeon: Ardis Hughs, MD;  Location: [facility];  Service: Urology;  Laterality: N/A;   TONSILLECTOMY  child   TYMPANOPLASTY Right 1970s   "had my ear drum replacement with a plastic one"   WRIST GANGLION EXCISION Left 1998    Social History   Tobacco Use   Smoking status: Former    Years: 30.00    Types: Cigarettes    Quit date: 06/07/1999    Years since quitting: 23.5   Smokeless tobacco: Never  Vaping Use   Vaping Use: Never used  Substance Use Topics   Alcohol use: Never   Drug use: Never    Family History  Problem Relation Age of Onset   Cancer Mother    Pneumonia Mother    Thyroid disease Mother    Thyroid  disease Sister    Obesity Daughter    Hypertension Daughter    Thyroid disease Son    Pneumonia Maternal Grandmother    Heart attack Maternal Grandfather    Diabetes Neg Hx     Allergies  Allergen Reactions   Aspirin     Per pt any pain medications - causes fluid build up    Bee Venom Swelling   Ibuprofen Swelling   Ozempic (0.25 Or 0.5 Mg-Dose) [Semaglutide(0.25 Or 0.'5mg'$ -Dos)] Diarrhea and Nausea And Vomiting    Flatulence   Poison Oak Extract Itching and Swelling   Statins     "skin feels like bugs under the skin"   Trulicity [Dulaglutide] Nausea And Vomiting and Other (See Comments)    Flatulance, "deathly sick"   Tylenol [Acetaminophen] Swelling   Victoza [Liraglutide] Other (See Comments)    Stomach upset issues     Medication list has been reviewed and updated.  Current Outpatient Medications on File Prior to Visit  Medication Sig Dispense Refill   ACCU-CHEK AVIVA PLUS test strip TEST BLOOD SUGAR THREE TIMES DAILY 300 strip 12   Accu-Chek Softclix Lancets lancets TEST BLOOD SUGAR THREE TIMES DAILY 300 each 1   Alcohol Swabs (DROPSAFE ALCOHOL PREP) 70 % PADS USE TO TEST BLOOD SUGAR 3 TIMES DAILY. 300 each 2   Alcohol Swabs PADS Use to test blood sugar 3 times daily. Dx: E11.9 300 each 2   Blood Glucose Calibration (GLUCOSE CONTROL) SOLN Use to test blood sugar 3 times daily. Dx: E11.9 1 each 2   Blood Glucose Monitoring Suppl (ACCU-CHEK AVIVA PLUS) w/Device KIT USE AS DIRECTED  TO TEST BLOOD GLUCOSE BID E11.9 1 kit 0   Carboxymethylcellul-Glycerin (LUBRICATING EYE DROPS OP) Place 1 drop into both eyes 3 (three) times daily.     clopidogrel (PLAVIX) 75 MG tablet TAKE 1 TABLET EVERY DAY (Patient taking differently: Take 75 mg by mouth every other day.) 90 tablet 10   famotidine (PEPCID) 20 MG tablet TAKE 1 TABLET EVERY DAY 90 tablet 3   gabapentin (NEURONTIN) 100 MG capsule TAKE 1 CAPSULE TWICE DAILY 180 capsule 1   gemfibrozil (LOPID) 600  MG tablet Take 1 tablet (600 mg  total) by mouth 2 (two) times daily before a meal. 180 tablet 1   Ginger, Zingiber officinalis, (GINGER ROOT PO) Take 1 tablet by mouth at bedtime.     glipiZIDE (GLUCOTROL) 5 MG tablet TAKE 1 TABLET TWICE DAILY BEFORE MEALS 180 tablet 1   Insulin Pen Needle 32G X 4 MM MISC Used to inject insulin 1x daily. 100 each 4   Insulin Syringe-Needle U-100 (INSULIN SYRINGE 1CC/30GX5/16") 30G X 5/16" 1 ML MISC Use to inject insulin 1 time per day. 100 each 2   losartan (COZAAR) 50 MG tablet TAKE 1 TABLET EVERY DAY 90 tablet 10   Menthol, Topical Analgesic, (BIOFREEZE ROLL-ON EX) Apply 1 application topically as needed (joint pain).     metFORMIN (GLUCOPHAGE) 1000 MG tablet Take 1 tablet (1,000 mg total) by mouth 2 (two) times daily with a meal. 180 tablet 1   niacin (NIASPAN) 500 MG CR tablet Take 1 tablet (500 mg total) by mouth at bedtime. 30 tablet 3   Omega-3 Fatty Acids (FISH OIL) 1000 MG CAPS Take 1,000 mg by mouth 2 (two) times a day.      ondansetron (ZOFRAN) 4 MG tablet Take 1 tablet (4 mg total) by mouth every 8 (eight) hours as needed for nausea or vomiting. 12 tablet 0   potassium chloride SA (KLOR-CON M) 20 MEQ tablet TAKE 1 TABLET EVERY DAY 90 tablet 10   Probiotic Product (PROBIOTIC DAILY PO) Take 1 capsule by mouth daily.     TRESIBA FLEXTOUCH 100 UNIT/ML FlexTouch Pen INJECT 60 UNITS UNDER THE SKIN AT BEDTIME 60 mL 3   valACYclovir (VALTREX) 1000 MG tablet Take 1 tablet (1,000 mg total) by mouth 3 (three) times daily. 21 tablet 0   zinc gluconate 50 MG tablet Take 50 mg by mouth at bedtime.     No current facility-administered medications on file prior to visit.    Review of Systems:  As per HPI- otherwise negative.   Physical Examination: Vitals:   12/20/22 1455  BP: 130/60  Pulse: 60  Resp: 16  Temp: (!) 97.5 F (36.4 C)  SpO2: 95%   Vitals:   12/20/22 1455  Weight: 181 lb 6.4 oz (82.3 kg)  Height: '5\' 4"'$  (1.626 m)   Body mass index is 31.14 kg/m. Ideal Body Weight:  Weight in (lb) to have BMI = 25: 145.3  GEN: no acute distress.  Obese, left sided facial drooping is obvious.  She is not able to completely close the left eye HEENT: Atraumatic, Normocephalic.  Ears and Nose: No external deformity. CV: RRR, No M/G/R. No JVD. No thrill. No extra heart sounds. PULM: CTA B, no wheezes, crackles, rhonchi. No retractions. No resp. distress. No accessory muscle use. ABD: S, NT, ND, +BS. No rebound. No HSM. EXTR: No c/c/e PSYCH: Normally interactive. Conversant.  Normal strength of both UE and LE   Assessment and Plan: Bell's palsy - Plan: predniSONE (DELTASONE) 10 MG tablet  Diabetic polyneuropathy associated with type 2 diabetes mellitus ([place]) - Plan: gabapentin (NEURONTIN) 100 MG capsule  Patient seen today with Bell's palsy.  She did have 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 on 10/31, but this seems entirely unrelated to her current problem.  All imaging from the emergency room was normal.  For the time being offered reassurance and encouragement, I did advise her to continue using moisturizing eyedrops or gel, use an eye patch to keep the eye closed when possible is  also a good idea.  She is wearing a sleep mask at night to keep the eye closed while she is asleep  She was given a lower than usual dose of prednisone at the onset of her symptoms due to high blood sugars.  Will try giving her a few more days of a lower dose taper to see if that may help  I also refilled gabapentin which may help with some of the neuropathic pain  We have an appointment for follow-up in about 3 weeks, she is asked to please see me sooner or contact me if anything is getting worse or if she has not seen some improvement.  Will check her A1c at next visit Signed Lamar Blinks, MD

## 2022-12-20 ENCOUNTER — Ambulatory Visit (INDEPENDENT_AMBULATORY_CARE_PROVIDER_SITE_OTHER): Payer: Medicare HMO | Admitting: Family Medicine

## 2022-12-20 ENCOUNTER — Other Ambulatory Visit: Payer: Self-pay | Admitting: Family Medicine

## 2022-12-20 VITALS — BP 130/60 | HR 60 | Temp 97.5°F | Resp 16 | Ht 64.0 in | Wt 181.4 lb

## 2022-12-20 DIAGNOSIS — E039 Hypothyroidism, unspecified: Secondary | ICD-10-CM

## 2022-12-20 DIAGNOSIS — E1142 Type 2 diabetes mellitus with diabetic polyneuropathy: Secondary | ICD-10-CM | POA: Diagnosis not present

## 2022-12-20 DIAGNOSIS — G510 Bell's palsy: Secondary | ICD-10-CM

## 2022-12-20 MED ORDER — PREDNISONE 10 MG PO TABS: ORAL_TABLET | ORAL | 0 refills | Status: AC

## 2022-12-20 MED ORDER — GABAPENTIN 100 MG PO CAPS: 100.0000 mg | ORAL_CAPSULE | Freq: Two times a day (BID) | ORAL | 1 refills | Status: DC

## 2022-12-25 ENCOUNTER — Telehealth: Payer: Self-pay

## 2022-12-25 NOTE — Telephone Encounter (Signed)
        Patient  visited [facility] on 1/10    Telephone encounter attempt :  1st   A HIPAA compliant voice message was left requesting a return call.  Instructed patient to call back    [facility], [facility]  [phone] [address], [place], [place] [address] Phone: [phone] Email: [email or web address]

## 2022-12-26 ENCOUNTER — Telehealth: Payer: Self-pay

## 2022-12-26 DIAGNOSIS — K582 Mixed irritable bowel syndrome: Secondary | ICD-10-CM | POA: Diagnosis not present

## 2022-12-26 NOTE — Telephone Encounter (Signed)
     Patient  visit on 1/10  at [facility]   Have you been able to follow up with your primary care physician? Yes   The patient was or was not able to obtain any needed medicine or equipment. Yes   Are there diet recommendations that you are having difficulty following? Yes   Patient expresses understanding of discharge instructions and education provided has no other needs at this time.  [facility], [facility], Care Management  [phone] [address], [place],  [address] Phone: [phone] Email: [email or web address]

## 2022-12-28 DIAGNOSIS — N1832 Chronic kidney disease, stage 3b: Secondary | ICD-10-CM | POA: Diagnosis not present

## 2022-12-28 DIAGNOSIS — E1122 Type 2 diabetes mellitus with diabetic chronic kidney disease: Secondary | ICD-10-CM | POA: Diagnosis not present

## 2022-12-28 DIAGNOSIS — I129 Hypertensive chronic kidney disease with stage 1 through stage 4 chronic kidney disease, or unspecified chronic kidney disease: Secondary | ICD-10-CM | POA: Diagnosis not present

## 2022-12-28 LAB — PROTEIN / CREATININE RATIO, URINE: Creatinine, Urine: 57.6

## 2023-01-08 ENCOUNTER — Encounter: Payer: Self-pay | Admitting: Family Medicine

## 2023-01-14 NOTE — Progress Notes (Addendum)
[place] at [facility] [address] [place], [place] [address] [phone]  Date:  01/15/2023   Name:  Mandy Kim   DOB:  [birth year]   MRN:  [id number]  PCP:  Darreld Mclean, MD    Chief Complaint: Follow-up (Follow  up)   History of Present Illness:  Mandy Kim is a 77 y.o. very pleasant female patient who presents with the following:  Patient seen today for follow-up visit She was seen by myself in January with concern of Bell's palsy At this time she is about 50% better than at our last visit.    History of poorly controlled diabetes, fatty liver, AAA status post repair 2013, hyperlipidemia, cervical cancer status post hysterectomy year 2000, bilateral breast cancer status post double mastectomy  She has struggled with C. difficile recently She had an elective coil embolization of an LICA aneurysm at the end of October-she stayed overnight in the hospital and was released to home  She was seen by her gastroenterologist also in January, Dr. Percell Miller with [facility] At that time she did not show evidence of C. difficile.  She was not sure about doing a colonoscopy-GI follow-up scheduled for 2 months  We have noted worsening renal function over the last year, I referred her to nephrology ?  Has she been seen She has been seen by urology, most recently in December  Pt reports she did see nephrology- Dr Moshe Cipro- at [facility].  I will request this report   She notes her glucose running 160- 170 recently   COVID-19 booster recommended Shingrix recommended Patient has declined flu shot Update A1c today Can offer CT coronary calcium- pt declines today   Taking 60 units of insulin daily at this time  She tends to have a protuberant abdomen.  We did a CT abdomen pelvis which did not show any cause of distention Lab Results  Component Value Date   HGBA1C 8.5 (H) 09/29/2022    Patient Active Problem List    Diagnosis Date Noted   Cerebral aneurysm 10/03/2022   BCC (basal cell carcinoma), face 05/18/2022   Carpal tunnel syndrome, bilateral 05/18/2022   Colon polyp 05/18/2022   Herniated intervertebral disk 05/18/2022   Hypokalemia 05/18/2022   Intermittent claudication ([place]) 05/18/2022   Intracranial aneurysm 05/18/2022   Wears glasses 05/16/2022   Stress incontinence due to pelvic organ prolapse 05/16/2022   PONV (postoperative nausea and vomiting) 05/16/2022   OSA on CPAP 05/16/2022   History of diverticulitis of colon 05/16/2022   History of bilateral breast cancer 05/16/2022   History of basal cell carcinoma excision 05/16/2022   Hiatal hernia 05/16/2022   Diverticulitis 05/16/2022   CKD (chronic kidney disease) 05/16/2022   Chronic cystitis 05/16/2022   Cancer ([place]) 05/16/2022   Anticoagulant long-term use 05/16/2022   Hypoxia 05/05/2022   Acute diarrhea 05/04/2022   Mixed hyperlipidemia due to type 2 diabetes mellitus ([place]) 05/04/2022   Essential hypertension 05/04/2022   GERD without esophagitis 05/04/2022   AKI (acute kidney injury) ([place]) 05/04/2022   Hypothyroidism 05/04/2022   Dark stools 05/04/2022   Myositis, unspecified 04/27/2020   Prolapse of female pelvic organs 05/24/2019   Cystocele with prolapse 05/23/2019   Acute respiratory failure with hypoxia ([place])    Sepsis ([place]) 05/28/2017   Elevated LFTs 05/28/2017   Acute lower UTI 05/28/2017   Dehydration 05/28/2017   Peripheral neuropathy 07/27/2014   Rectocele 04/02/2013   Urge incontinence  04/02/2013   Urinary tract infection, site not specified 04/02/2013   Urinary urgency 04/02/2013   AAA (abdominal aortic aneurysm) ([place]) 02/12/2012   Fatty liver 02/12/2012   Breast cancer ([place]) 02/12/2012   Cervical cancer ([place]) 02/12/2012   Type 2 diabetes mellitus with diabetic polyneuropathy, with long-term current use of insulin ([place]) 02/12/2012   Hyperlipidemia 02/12/2012   S/P AAA (abdominal aortic aneurysm)  repair 09/15/2011   Impaired glucose tolerance 06/26/2011   History of cervical cancer 2000   CHF (congestive heart failure) ([place]) 2000    Past Medical History:  Diagnosis Date   AAA (abdominal aortic aneurysm) ([place]) 02/12/2012   She is a pt of vascular surgery in [place]- she had a repair done in approx 2013.    Annual [place], stent has been in good position   Acute diarrhea 05/04/2022   Acute lower UTI 05/28/2017   Acute respiratory failure with hypoxia (HCC)    AKI (acute kidney injury) ([place]) 05/04/2022   Anticoagulant long-term use    plavix   Arthritis    Breast cancer ([place]) 02/12/2012   Double mastectomy, 2005.  Now cured   Cancer ([place])    CHF (congestive heart failure) ([place]) 2000   Chronic cystitis    Chronic kidney disease    Cystocele with prolapse 05/23/2019   Dark stools 05/04/2022   Dehydration 05/28/2017   Diverticulitis    Elevated LFTs 05/28/2017   Essential hypertension 05/04/2022   Fatty liver 02/12/2012   GERD (gastroesophageal reflux disease)    GERD without esophagitis 05/04/2022   Hiatal hernia    History of basal cell carcinoma excision    face/ nose right side   History of bilateral breast cancer followed by oncologist until 2008,  per pt no recurrence   right 2002  and left 2003 s/p  mastectomy with completed chemo and tamexifen therapy    History of cervical cancer 2000   s/p  TAH w/ BSO   History of diverticulitis of colon    Hyperlipidemia    Hypothyroidism    Hypoxia 05/05/2022   Mixed hyperlipidemia due to type 2 diabetes mellitus ([place]) 05/04/2022   OSA on CPAP    PAD (peripheral artery disease) (HCC)    Peripheral neuropathy    PONV (postoperative nausea and vomiting)    Prolapse of female pelvic organs 05/24/2019   S/P AAA (abdominal aortic aneurysm) repair 09/15/2011   Sepsis ([place]) 05/28/2017   Stress incontinence due to pelvic organ prolapse    Type 2 diabetes mellitus treated with insulin ([place])    followed by pcp   Type 2  diabetes mellitus with diabetic polyneuropathy, with long-term current use of insulin ([place]) 02/12/2012   Wears glasses     Past Surgical History:  Procedure Laterality Date   ABDOMINAL HYSTERECTOMY  2000   w/  BSO,  and Bladder tacking abdominally   CARPAL TUNNEL RELEASE Left 1999   CATARACT EXTRACTION W/ INTRAOCULAR LENS  IMPLANT, BILATERAL  2015   ENDOVASCULAR REPAIR/STENT GRAFT  09-15-2011    @[facility]    INCONTINENCE SURGERY  2007  approx.   IR ANGIO INTRA EXTRACRAN SEL INTERNAL CAROTID UNI L MOD SED  04/11/2022   IR ANGIO INTRA EXTRACRAN SEL INTERNAL CAROTID UNI L MOD SED  10/03/2022   IR ANGIO VERTEBRAL SEL VERTEBRAL UNI R MOD SED  04/11/2022   IR ANGIOGRAM FOLLOW UP STUDY  10/03/2022   IR ANGIOGRAM FOLLOW UP STUDY  10/03/2022   IR ANGIOGRAM FOLLOW UP STUDY  10/03/2022  IR NEURO EACH ADD'L AFTER BASIC UNI LEFT (MS)  10/03/2022   IR TRANSCATH/EMBOLIZ  10/03/2022   IR US GUIDE VASC ACCESS RIGHT  04/11/2022   MASTECTOMY Bilateral right 2002;  left 2003   lymph node dissection done only on right side   RADIOLOGY WITH ANESTHESIA N/A 10/03/2022   Procedure: 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;  Surgeon: Consuella Lose, MD;  Location: [facility];  Service: Radiology;  Laterality: N/A;   ROBOTIC ASSISTED LAPAROSCOPIC SACROCOLPOPEXY N/A 05/23/2019   Procedure: XI ROBOTIC ASSISTED LAPAROSCOPIC SACROCOLPOPEXY;  Surgeon: Ardis Hughs, MD;  Location: [facility];  Service: Urology;  Laterality: N/A;   TONSILLECTOMY  child   TYMPANOPLASTY Right 1970s   "had my ear drum replacement with a plastic one"   WRIST GANGLION EXCISION Left 1998    Social History   Tobacco Use   Smoking status: Former    Years: 30.00    Types: Cigarettes    Quit date: 06/07/1999    Years since quitting: 23.6   Smokeless tobacco: Never  Vaping Use   Vaping Use: Never used  Substance Use Topics   Alcohol use: Never   Drug use: Never    Family History  Problem Relation Age of Onset    Cancer Mother    Pneumonia Mother    Thyroid disease Mother    Thyroid disease Sister    Obesity Daughter    Hypertension Daughter    Thyroid disease Son    Pneumonia Maternal Grandmother    Heart attack Maternal Grandfather    Diabetes Neg Hx     Allergies  Allergen Reactions   Aspirin     Per pt any pain medications - causes fluid build up    Bee Venom Swelling   Ibuprofen Swelling   Ozempic (0.25 Or 0.5 Mg-Dose) [Semaglutide(0.25 Or 0.55m-Dos)] Diarrhea and Nausea And Vomiting    Flatulence   Poison [place] Extract Itching and Swelling   Statins     "skin feels like bugs under the skin"   Trulicity [Dulaglutide] Nausea And Vomiting and Other (See Comments)    Flatulance, "deathly sick"   Tylenol [Acetaminophen] Swelling   Victoza [Liraglutide] Other (See Comments)    Stomach upset issues     Medication list has been reviewed and updated.  Current Outpatient Medications on File Prior to Visit  Medication Sig Dispense Refill   ACCU-CHEK AVIVA PLUS test strip TEST BLOOD SUGAR THREE TIMES DAILY 300 strip 12   Accu-Chek Softclix Lancets lancets TEST BLOOD SUGAR THREE TIMES DAILY 300 each 1   Alcohol Swabs (DROPSAFE ALCOHOL PREP) 70 % PADS USE TO TEST BLOOD SUGAR 3 TIMES DAILY. 300 each 2   Alcohol Swabs PADS Use to test blood sugar 3 times daily. Dx: E11.9 300 each 2   Blood Glucose Calibration (GLUCOSE CONTROL) SOLN Use to test blood sugar 3 times daily. Dx: E11.9 1 each 2   Blood Glucose Monitoring Suppl (ACCU-CHEK AVIVA PLUS) w/Device KIT USE AS DIRECTED  TO TEST BLOOD GLUCOSE BID E11.9 1 kit 0   Carboxymethylcellul-Glycerin (LUBRICATING EYE DROPS OP) Place 1 drop into both eyes 3 (three) times daily.     clopidogrel (PLAVIX) 75 MG tablet TAKE 1 TABLET EVERY DAY (Patient taking differently: Take 75 mg by mouth every other day.) 90 tablet 10   famotidine (PEPCID) 20 MG tablet TAKE 1 TABLET EVERY DAY 90 tablet 3   gabapentin (NEURONTIN) 100 MG capsule Take 1 capsule (100 mg  total) by mouth 2 (  two) times daily. 180 capsule 1   gemfibrozil (LOPID) 600 MG tablet Take 1 tablet (600 mg total) by mouth 2 (two) times daily before a meal. 180 tablet 1   Ginger, Zingiber officinalis, (GINGER ROOT PO) Take 1 tablet by mouth at bedtime.     glipiZIDE (GLUCOTROL) 5 MG tablet TAKE 1 TABLET TWICE DAILY BEFORE MEALS 180 tablet 1   Insulin Pen Needle 32G X 4 MM MISC Used to inject insulin 1x daily. 100 each 4   Insulin Syringe-Needle U-100 (INSULIN SYRINGE 1CC/30GX5/16") 30G X 5/16" 1 ML MISC Use to inject insulin 1 time per day. 100 each 2   levothyroxine (SYNTHROID) 50 MCG tablet TAKE 1 TABLET EVERY DAY BEFORE BREAKFAST 90 tablet 3   losartan (COZAAR) 50 MG tablet TAKE 1 TABLET EVERY DAY 90 tablet 10   Menthol, Topical Analgesic, (BIOFREEZE ROLL-ON EX) Apply 1 application topically as needed (joint pain).     metFORMIN (GLUCOPHAGE) 1000 MG tablet Take 1 tablet (1,000 mg total) by mouth 2 (two) times daily with a meal. 180 tablet 1   niacin (NIASPAN) 500 MG CR tablet Take 1 tablet (500 mg total) by mouth at bedtime. 30 tablet 3   Omega-3 Fatty Acids (FISH OIL) 1000 MG CAPS Take 1,000 mg by mouth 2 (two) times a day.      ondansetron (ZOFRAN) 4 MG tablet Take 1 tablet (4 mg total) by mouth every 8 (eight) hours as needed for nausea or vomiting. 12 tablet 0   potassium chloride SA (KLOR-CON M) 20 MEQ tablet TAKE 1 TABLET EVERY DAY 90 tablet 10   predniSONE (DELTASONE) 10 MG tablet Take 20 mg daily for 3 days, then 10 mg daily for 3 days 9 tablet 0   Probiotic Product (PROBIOTIC DAILY PO) Take 1 capsule by mouth daily.     TRESIBA FLEXTOUCH 100 UNIT/ML FlexTouch Pen INJECT 60 UNITS UNDER THE SKIN AT BEDTIME 60 mL 3   valACYclovir (VALTREX) 1000 MG tablet Take 1 tablet (1,000 mg total) by mouth 3 (three) times daily. 21 tablet 0   zinc gluconate 50 MG tablet Take 50 mg by mouth at bedtime.     No current facility-administered medications on file prior to visit.    Review of  Systems:  As per HPI- otherwise negative.  Wt Readings from Last 3 Encounters:  01/15/23 179 lb (81.2 kg)  12/20/22 181 lb 6.4 oz (82.3 kg)  10/16/22 178 lb 12.8 oz (81.1 kg)     Physical Examination: Vitals:   01/15/23 1348  BP: 130/64  Pulse: 81  Resp: 16  Temp: (!) 97.5 F (36.4 C)  SpO2: 94%   Vitals:   01/15/23 1348  Weight: 179 lb (81.2 kg)  Height: 5' 4"$  (1.626 m)   Body mass index is 30.73 kg/m. Ideal Body Weight: Weight in (lb) to have BMI = 25: 145.3  GEN: no acute distress.  Central obesity, looks well HEENT: Atraumatic, Normocephalic.  Ears and Nose: No external deformity. CV: RRR, No M/G/R. No JVD. No thrill. No extra heart sounds. PULM: CTA B, no wheezes, crackles, rhonchi. No retractions. No resp. distress. No accessory muscle use. ABD: S, NT, ND, +BS. No rebound. No HSM. EXTR: No c/c/e PSYCH: Normally interactive. Conversant.    Assessment and Plan: Essential hypertension  Fatty liver  GERD without esophagitis  Type 2 diabetes mellitus with diabetic polyneuropathy, with long-term current use of insulin (HCC) - Plan: Hemoglobin A1c, Comprehensive metabolic panel  Acquired hypothyroidism - Plan: TSH  Blood pressure under good control, continue current regimen Follow-up on diabetes today with lab work as above Follow-up on thyroid Will plan further follow- up pending labs.  Patient declines CT coronary calcium at this time Signed Lamar Blinks, MD  Addendum 2/14, received labs as below.  Called patient that she does not have MyChart She is seeing her nephrologist 4/10 She is taking metformin once a day- ok with her current renal function  Addnd 2/18- called pt- would like to add Farxiga 10 for CRI and glucose control, but need to watch for more frequent UTI.  She understands and will let me know how she does    Results for orders placed or performed in visit on 01/15/23  Hemoglobin A1c  Result Value Ref Range   Hgb A1c MFr Bld 8.4  (H) 4.6 - 6.5 %  Comprehensive metabolic panel  Result Value Ref Range   Sodium 141 135 - 145 mEq/L   Potassium 4.7 3.5 - 5.1 mEq/L   Chloride 104 96 - 112 mEq/L   CO2 24 19 - 32 mEq/L   Glucose, Bld 134 (H) 70 - 99 mg/dL   BUN 23 6 - 23 mg/dL   Creatinine, Ser 1.48 (H) 0.40 - 1.20 mg/dL   Total Bilirubin 0.3 0.2 - 1.2 mg/dL   Alkaline Phosphatase 39 39 - 117 U/L   AST 44 (H) 0 - 37 U/L   ALT 45 (H) 0 - 35 U/L   Total Protein 7.0 6.0 - 8.3 g/dL   Albumin 4.4 3.5 - 5.2 g/dL   GFR 33.77 (L) >60.00 mL/min   Calcium 10.1 8.4 - 10.5 mg/dL  TSH  Result Value Ref Range   TSH 2.62 0.35 - 5.50 uIU/mL

## 2023-01-14 NOTE — Patient Instructions (Incomplete)
It was good to see you today, I will be in touch with your labs I would like to get you seen by nephrology-this is a medical kidney doctor, as opposed to a Printmaker

## 2023-01-15 ENCOUNTER — Ambulatory Visit (INDEPENDENT_AMBULATORY_CARE_PROVIDER_SITE_OTHER): Payer: Medicare HMO | Admitting: Family Medicine

## 2023-01-15 VITALS — BP 130/64 | HR 81 | Temp 97.5°F | Resp 16 | Ht 64.0 in | Wt 179.0 lb

## 2023-01-15 DIAGNOSIS — E039 Hypothyroidism, unspecified: Secondary | ICD-10-CM | POA: Diagnosis not present

## 2023-01-15 DIAGNOSIS — K760 Fatty (change of) liver, not elsewhere classified: Secondary | ICD-10-CM | POA: Diagnosis not present

## 2023-01-15 DIAGNOSIS — K219 Gastro-esophageal reflux disease without esophagitis: Secondary | ICD-10-CM

## 2023-01-15 DIAGNOSIS — E1142 Type 2 diabetes mellitus with diabetic polyneuropathy: Secondary | ICD-10-CM

## 2023-01-15 DIAGNOSIS — E118 Type 2 diabetes mellitus with unspecified complications: Secondary | ICD-10-CM

## 2023-01-15 DIAGNOSIS — I10 Essential (primary) hypertension: Secondary | ICD-10-CM

## 2023-01-15 DIAGNOSIS — Z794 Long term (current) use of insulin: Secondary | ICD-10-CM | POA: Diagnosis not present

## 2023-01-16 LAB — HEMOGLOBIN A1C: Hgb A1c MFr Bld: 8.4 % — ABNORMAL HIGH (ref 4.6–6.5)

## 2023-01-16 LAB — COMPREHENSIVE METABOLIC PANEL
ALT: 45 U/L — ABNORMAL HIGH (ref 0–35)
AST: 44 U/L — ABNORMAL HIGH (ref 0–37)
Albumin: 4.4 g/dL (ref 3.5–5.2)
Alkaline Phosphatase: 39 U/L (ref 39–117)
BUN: 23 mg/dL (ref 6–23)
CO2: 24 mEq/L (ref 19–32)
Calcium: 10.1 mg/dL (ref 8.4–10.5)
Chloride: 104 mEq/L (ref 96–112)
Creatinine, Ser: 1.48 mg/dL — ABNORMAL HIGH (ref 0.40–1.20)
GFR: 33.77 mL/min — ABNORMAL LOW (ref >60.00)
Glucose, Bld: 134 mg/dL — ABNORMAL HIGH (ref 70–99)
Potassium: 4.7 mEq/L (ref 3.5–5.1)
Sodium: 141 mEq/L (ref 135–145)
Total Bilirubin: 0.3 mg/dL (ref 0.2–1.2)
Total Protein: 7 g/dL (ref 6.0–8.3)

## 2023-01-16 LAB — TSH: TSH: 2.62 u[IU]/mL (ref 0.35–5.50)

## 2023-01-17 NOTE — Addendum Note (Signed)
Addended by: Lamar Blinks C on: 01/17/2023 01:00 PM   Modules accepted: Orders

## 2023-01-21 ENCOUNTER — Other Ambulatory Visit: Payer: Self-pay | Admitting: Family Medicine

## 2023-01-21 DIAGNOSIS — E118 Type 2 diabetes mellitus with unspecified complications: Secondary | ICD-10-CM

## 2023-01-21 MED ORDER — DAPAGLIFLOZIN PROPANEDIOL 10 MG PO TABS: 10.0000 mg | ORAL_TABLET | Freq: Every day | ORAL | 3 refills | Status: DC

## 2023-01-21 NOTE — Addendum Note (Signed)
Addended by: Darreld Mclean on: 01/21/2023 06:29 AM   Modules accepted: Orders

## 2023-01-21 NOTE — Addendum Note (Signed)
Addended by: Lamar Blinks C on: 01/21/2023 04:58 PM   Modules accepted: Orders

## 2023-01-23 ENCOUNTER — Telehealth: Payer: Self-pay | Admitting: Family Medicine

## 2023-01-23 DIAGNOSIS — Z794 Long term (current) use of insulin: Secondary | ICD-10-CM

## 2023-01-23 MED ORDER — DAPAGLIFLOZIN PROPANEDIOL 10 MG PO TABS: 10.0000 mg | ORAL_TABLET | Freq: Every day | ORAL | 3 refills | Status: DC

## 2023-01-23 NOTE — Telephone Encounter (Signed)
Pt stated that all medications should go to [facility].  The only one that went to [facility] was the [place].  I advised that was possibly more likely $290 and I resent to [facility] for her.

## 2023-01-23 NOTE — Telephone Encounter (Signed)
Pt said she went to pharmacy to pick up the glipizide but it was $290 which she cannot pay. Pt wants to know if there is something else that will do the same thing but not cost so much. Please advise pt.

## 2023-02-08 DIAGNOSIS — K582 Mixed irritable bowel syndrome: Secondary | ICD-10-CM | POA: Diagnosis not present

## 2023-02-15 DIAGNOSIS — Z1211 Encounter for screening for malignant neoplasm of colon: Secondary | ICD-10-CM | POA: Diagnosis not present

## 2023-02-16 LAB — COLOGUARD: Cologuard: NEGATIVE

## 2023-02-22 LAB — COLOGUARD: COLOGUARD: NEGATIVE

## 2023-02-22 LAB — EXTERNAL GENERIC LAB PROCEDURE: COLOGUARD: NEGATIVE

## 2023-03-12 ENCOUNTER — Other Ambulatory Visit: Payer: Self-pay | Admitting: Family Medicine

## 2023-03-12 DIAGNOSIS — E118 Type 2 diabetes mellitus with unspecified complications: Secondary | ICD-10-CM

## 2023-03-29 ENCOUNTER — Other Ambulatory Visit: Payer: Self-pay | Admitting: Neurosurgery

## 2023-03-29 DIAGNOSIS — I671 Cerebral aneurysm, nonruptured: Secondary | ICD-10-CM

## 2023-03-30 ENCOUNTER — Other Ambulatory Visit: Payer: Self-pay | Admitting: Neurosurgery

## 2023-04-05 ENCOUNTER — Other Ambulatory Visit: Payer: Self-pay | Admitting: Family Medicine

## 2023-04-05 DIAGNOSIS — E785 Hyperlipidemia, unspecified: Secondary | ICD-10-CM

## 2023-04-16 ENCOUNTER — Ambulatory Visit: Payer: Medicare HMO | Admitting: Family Medicine

## 2023-04-24 ENCOUNTER — Other Ambulatory Visit (HOSPITAL_COMMUNITY): Payer: Medicare HMO

## 2023-05-15 ENCOUNTER — Other Ambulatory Visit: Payer: Self-pay

## 2023-05-15 ENCOUNTER — Other Ambulatory Visit: Payer: Self-pay | Admitting: Neurosurgery

## 2023-05-15 ENCOUNTER — Ambulatory Visit (HOSPITAL_COMMUNITY)
Admission: RE | Admit: 2023-05-15 | Discharge: 2023-05-15 | Disposition: A | Discharge status: Home / Self Care | Payer: Medicare HMO | Source: Ambulatory Visit | Attending: Neurosurgery | Admitting: Neurosurgery

## 2023-05-15 DIAGNOSIS — I671 Cerebral aneurysm, nonruptured: Secondary | ICD-10-CM

## 2023-05-15 DIAGNOSIS — Z7902 Long term (current) use of antithrombotics/antiplatelets: Secondary | ICD-10-CM | POA: Insufficient documentation

## 2023-05-15 DIAGNOSIS — Z8679 Personal history of other diseases of the circulatory system: Secondary | ICD-10-CM | POA: Diagnosis not present

## 2023-05-15 HISTORY — PX: IR ANGIO INTRA EXTRACRAN SEL COM CAROTID INNOMINATE UNI L MOD SED: IMG5358

## 2023-05-15 HISTORY — PX: IR ANGIO INTRA EXTRACRAN SEL INTERNAL CAROTID UNI L MOD SED: IMG5361

## 2023-05-15 HISTORY — PX: IR US GUIDE VASC ACCESS RIGHT: IMG2390

## 2023-05-15 LAB — BASIC METABOLIC PANEL
Anion gap: 13 (ref 5–15)
BUN: 57 mg/dL — ABNORMAL HIGH (ref 8–23)
CO2: 22 mmol/L (ref 22–32)
Calcium: 10.1 mg/dL (ref 8.9–10.3)
Chloride: 101 mmol/L (ref 98–111)
Creatinine, Ser: 2.17 mg/dL — ABNORMAL HIGH (ref 0.44–1.00)
GFR, Estimated: 23 mL/min — ABNORMAL LOW (ref >60)
Glucose, Bld: 82 mg/dL (ref 70–99)
Potassium: 3.9 mmol/L (ref 3.5–5.1)
Sodium: 136 mmol/L (ref 135–145)

## 2023-05-15 LAB — CBC WITH DIFFERENTIAL/PLATELET
Abs Immature Granulocytes: 0.03 10*3/uL (ref 0.00–0.07)
Basophils Absolute: 0 10*3/uL (ref 0.0–0.1)
Basophils Relative: 1 %
Eosinophils Absolute: 0.3 10*3/uL (ref 0.0–0.5)
Eosinophils Relative: 4 %
HCT: 35.3 % — ABNORMAL LOW (ref 36.0–46.0)
Hemoglobin: 11.7 g/dL — ABNORMAL LOW (ref 12.0–15.0)
Immature Granulocytes: 1 %
Lymphocytes Relative: 57 %
Lymphs Abs: 3.8 10*3/uL (ref 0.7–4.0)
MCH: 29.5 pg (ref 26.0–34.0)
MCHC: 33.1 g/dL (ref 30.0–36.0)
MCV: 89.1 fL (ref 80.0–100.0)
Monocytes Absolute: 0.7 10*3/uL (ref 0.1–1.0)
Monocytes Relative: 10 %
Neutro Abs: 1.8 10*3/uL (ref 1.7–7.7)
Neutrophils Relative %: 27 %
Platelets: 277 10*3/uL (ref 150–400)
RBC: 3.96 MIL/uL (ref 3.87–5.11)
RDW: 15.2 % (ref 11.5–15.5)
WBC: 6.6 10*3/uL (ref 4.0–10.5)
nRBC: 0 % (ref 0.0–0.2)

## 2023-05-15 LAB — PROTIME-INR
INR: 1.1 (ref 0.8–1.2)
Prothrombin Time: 14.8 seconds (ref 11.4–15.2)

## 2023-05-15 LAB — GLUCOSE, CAPILLARY
Glucose-Capillary: 84 mg/dL (ref 70–99)
Glucose-Capillary: 63 mg/dL — ABNORMAL LOW (ref 70–99)
Glucose-Capillary: 77 mg/dL (ref 70–99)

## 2023-05-15 LAB — APTT: aPTT: 38 seconds — ABNORMAL HIGH (ref 24–36)

## 2023-05-15 MED ORDER — NITROGLYCERIN 1 MG/10 ML FOR IR/CATH LAB
INTRA_ARTERIAL | Status: AC
  Filled 2023-05-15: qty 10

## 2023-05-15 MED ORDER — VERAPAMIL HCL 2.5 MG/ML IV SOLN: INTRA_ARTERIAL | Status: AC | PRN

## 2023-05-15 MED ORDER — LIDOCAINE HCL 1 % IJ SOLN
INTRAMUSCULAR | Status: AC
  Filled 2023-05-15: qty 20

## 2023-05-15 MED ORDER — HEPARIN SODIUM (PORCINE) 1000 UNIT/ML IJ SOLN
INTRAMUSCULAR | Status: AC
  Filled 2023-05-15: qty 10

## 2023-05-15 MED ORDER — IOHEXOL 300 MG/ML  SOLN
100.0000 mL | Freq: Once | INTRAMUSCULAR | Status: AC | PRN
  Administered 2023-05-15: 30 mL via INTRA_ARTERIAL

## 2023-05-15 MED ORDER — SODIUM CHLORIDE 0.9 % IV SOLN: INTRAVENOUS | Status: DC

## 2023-05-15 MED ORDER — VERAPAMIL HCL 2.5 MG/ML IV SOLN
INTRAVENOUS | Status: AC
  Filled 2023-05-15: qty 2

## 2023-05-15 MED ORDER — HEPARIN SODIUM (PORCINE) 1000 UNIT/ML IJ SOLN
INTRAMUSCULAR | Status: AC | PRN
  Administered 2023-05-15: 3000 [IU] via INTRAVENOUS

## 2023-05-15 MED ORDER — CEFAZOLIN SODIUM-DEXTROSE 2-4 GM/100ML-% IV SOLN: 2.0000 g | INTRAVENOUS | Status: DC

## 2023-05-15 MED ORDER — CHLORHEXIDINE GLUCONATE CLOTH 2 % EX PADS: 6.0000 | MEDICATED_PAD | Freq: Once | CUTANEOUS | Status: DC

## 2023-05-15 MED ORDER — FENTANYL CITRATE (PF) 100 MCG/2ML IJ SOLN
INTRAMUSCULAR | Status: AC
  Filled 2023-05-15: qty 2

## 2023-05-15 MED ORDER — FENTANYL CITRATE (PF) 100 MCG/2ML IJ SOLN
INTRAMUSCULAR | Status: AC | PRN
  Administered 2023-05-15: 25 ug via INTRAVENOUS

## 2023-05-15 MED ORDER — MIDAZOLAM HCL 2 MG/2ML IJ SOLN
INTRAMUSCULAR | Status: AC
  Filled 2023-05-15: qty 2

## 2023-05-15 MED ORDER — MIDAZOLAM HCL 2 MG/2ML IJ SOLN
INTRAMUSCULAR | Status: AC | PRN
  Administered 2023-05-15: 1 mg via INTRAVENOUS

## 2023-05-15 NOTE — Discharge Instructions (Signed)

## 2023-05-15 NOTE — Progress Notes (Signed)
Patient and husband was given discharge instructions. Both verbalized understanding. 

## 2023-05-15 NOTE — H&P (Signed)
Chief Complaint   Aneurysm  History of Present Illness  Mandy Kim is a 77 y.o. female who underwent elective coil embolization of a left Pcom aneurysm more than six months ago. She also has a history of aortic aneurysm stent-graft repair and remains on Plavix for that. She presents today for routine short-term angiographic follow up.  Past Medical History   Past Medical History:  Diagnosis Date   AAA (abdominal aortic aneurysm) (HCC) 02/12/2012   She is a pt of vascular surgery in [place]- she had a repair done in approx 2013.    Annual [place], stent has been in good position   Acute diarrhea 05/04/2022   Acute lower UTI 05/28/2017   Acute respiratory failure with hypoxia (HCC)    AKI (acute kidney injury) (HCC) 05/04/2022   Anticoagulant long-term use    plavix   Arthritis    Breast cancer (HCC) 02/12/2012   Double mastectomy, 2005.  Now cured   Cancer (HCC)    CHF (congestive heart failure) (HCC) 2000   Chronic cystitis    Chronic kidney disease    Cystocele with prolapse 05/23/2019   Dark stools 05/04/2022   Dehydration 05/28/2017   Diverticulitis    Elevated LFTs 05/28/2017   Essential hypertension 05/04/2022   Fatty liver 02/12/2012   GERD (gastroesophageal reflux disease)    GERD without esophagitis 05/04/2022   Hiatal hernia    History of basal cell carcinoma excision    face/ nose right side   History of bilateral breast cancer followed by oncologist until 2008,  per pt no recurrence   right 2002  and left 2003 s/p  mastectomy with completed chemo and tamexifen therapy    History of cervical cancer 2000   s/p  TAH w/ BSO   History of diverticulitis of colon    Hyperlipidemia    Hypothyroidism    Hypoxia 05/05/2022   Mixed hyperlipidemia due to type 2 diabetes mellitus (HCC) 05/04/2022   OSA on CPAP    PAD (peripheral artery disease) (HCC)    Peripheral neuropathy    PONV (postoperative nausea and vomiting)    Prolapse of female pelvic organs  05/24/2019   S/P AAA (abdominal aortic aneurysm) repair 09/15/2011   Sepsis (HCC) 05/28/2017   Stress incontinence due to pelvic organ prolapse    Type 2 diabetes mellitus treated with insulin (HCC)    followed by pcp   Type 2 diabetes mellitus with diabetic polyneuropathy, with long-term current use of insulin (HCC) 02/12/2012   Wears glasses     Past Surgical History   Past Surgical History:  Procedure Laterality Date   ABDOMINAL HYSTERECTOMY  2000   w/  BSO,  and Bladder tacking abdominally   CARPAL TUNNEL RELEASE Left 1999   CATARACT EXTRACTION W/ INTRAOCULAR LENS  IMPLANT, BILATERAL  2015   ENDOVASCULAR REPAIR/STENT GRAFT  09-15-2011    @[facility]    INCONTINENCE SURGERY  2007  approx.   IR ANGIO INTRA EXTRACRAN SEL INTERNAL CAROTID UNI L MOD SED  04/11/2022   IR ANGIO INTRA EXTRACRAN SEL INTERNAL CAROTID UNI L MOD SED  10/03/2022   IR ANGIO VERTEBRAL SEL VERTEBRAL UNI R MOD SED  04/11/2022   IR ANGIOGRAM FOLLOW UP STUDY  10/03/2022   IR ANGIOGRAM FOLLOW UP STUDY  10/03/2022   IR ANGIOGRAM FOLLOW UP STUDY  10/03/2022   IR NEURO EACH ADD'L AFTER BASIC UNI LEFT (MS)  10/03/2022   IR TRANSCATH/EMBOLIZ  10/03/2022   IR US GUIDE VASC ACCESS RIGHT  04/11/2022   MASTECTOMY Bilateral right 2002;  left 2003   lymph node dissection done only on right side   RADIOLOGY WITH ANESTHESIA N/A 10/03/2022   Procedure: 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;  Surgeon: Lisbeth Renshaw, MD;  Location: [facility] OR;  Service: Radiology;  Laterality: N/A;   ROBOTIC ASSISTED LAPAROSCOPIC SACROCOLPOPEXY N/A 05/23/2019   Procedure: XI ROBOTIC ASSISTED LAPAROSCOPIC SACROCOLPOPEXY;  Surgeon: Crist Fat, MD;  Location: [facility] ORS;  Service: Urology;  Laterality: N/A;   TONSILLECTOMY  child   TYMPANOPLASTY Right 1970s   "had my ear drum replacement with a plastic one"   WRIST GANGLION EXCISION Left 1998    Social History   Social History   Tobacco Use   Smoking status: Former     Years: 30    Types: Cigarettes    Quit date: 06/07/1999    Years since quitting: 23.9   Smokeless tobacco: Never  Vaping Use   Vaping Use: Never used  Substance Use Topics   Alcohol use: Never   Drug use: Never    Medications   Prior to Admission medications   Medication Sig Start Date End Date Taking? Authorizing Provider  ACCU-CHEK AVIVA PLUS test strip TEST BLOOD SUGAR THREE TIMES DAILY 05/15/22   Copland, Gwenlyn Found, MD  Accu-Chek Softclix Lancets lancets TEST BLOOD SUGAR THREE TIMES DAILY 07/20/21   Copland, Gwenlyn Found, MD  Alcohol Swabs (DROPSAFE ALCOHOL PREP) 70 % PADS USE TO TEST BLOOD SUGAR 3 TIMES DAILY. 07/20/21   Copland, Gwenlyn Found, MD  Alcohol Swabs PADS Use to test blood sugar 3 times daily. Dx: E11.9 01/14/19   Copland, Gwenlyn Found, MD  Blood Glucose Calibration (GLUCOSE CONTROL) SOLN Use to test blood sugar 3 times daily. Dx: E11.9 12/20/15   Copland, Gwenlyn Found, MD  Blood Glucose Monitoring Suppl (ACCU-CHEK AVIVA PLUS) w/Device KIT USE AS DIRECTED  TO TEST BLOOD GLUCOSE BID E11.9 05/11/21   Copland, Gwenlyn Found, MD  Carboxymethylcellul-Glycerin (LUBRICATING EYE DROPS OP) Place 1 drop into both eyes 3 (three) times daily.    [provider]  clopidogrel (PLAVIX) 75 MG tablet TAKE 1 TABLET EVERY DAY Patient taking differently: Take 75 mg by mouth every other day. 09/18/22   Copland, Gwenlyn Found, MD  dapagliflozin propanediol (FARXIGA) 10 MG TABS tablet Take 1 tablet (10 mg total) by mouth daily. 01/23/23   Copland, Gwenlyn Found, MD  famotidine (PEPCID) 20 MG tablet TAKE 1 TABLET EVERY DAY 12/11/22   Copland, Gwenlyn Found, MD  gabapentin (NEURONTIN) 100 MG capsule Take 1 capsule (100 mg total) by mouth 2 (two) times daily. 12/20/22   Copland, Gwenlyn Found, MD  gemfibrozil (LOPID) 600 MG tablet TAKE 1 TABLET TWICE DAILY BEFORE MEALS 04/05/23   Copland, Gwenlyn Found, MD  Ginger, Zingiber officinalis, (GINGER ROOT PO) Take 1 tablet by mouth at bedtime.    [provider]  glipiZIDE (GLUCOTROL)  5 MG tablet TAKE 1 TABLET TWICE DAILY BEFORE MEALS 01/22/23   Copland, Gwenlyn Found, MD  Insulin Pen Needle 32G X 4 MM MISC Used to inject insulin 1x daily. 10/18/17   Romero Belling, MD  Insulin Syringe-Needle U-100 (INSULIN SYRINGE 1CC/30GX5/16") 30G X 5/16" 1 ML MISC Use to inject insulin 1 time per day. 05/22/17   Romero Belling, MD  levothyroxine (SYNTHROID) 50 MCG tablet TAKE 1 TABLET EVERY DAY BEFORE BREAKFAST 12/20/22   Copland, Gwenlyn Found, MD  losartan (COZAAR) 50 MG tablet TAKE 1 TABLET EVERY DAY 09/19/22   Copland, Shanda Bumps  C, MD  Menthol, Topical Analgesic, (BIOFREEZE ROLL-ON EX) Apply 1 application topically as needed (joint pain).    [provider]  metFORMIN (GLUCOPHAGE) 1000 MG tablet TAKE 1 TABLET TWICE DAILY WITH MEALS 03/12/23   Copland, Gwenlyn Found, MD  niacin (NIASPAN) 500 MG CR tablet Take 1 tablet (500 mg total) by mouth at bedtime. 08/25/15   Copland, Gwenlyn Found, MD  Omega-3 Fatty Acids (FISH OIL) 1000 MG CAPS Take 1,000 mg by mouth 2 (two) times a day.     [provider]  ondansetron (ZOFRAN) 4 MG tablet Take 1 tablet (4 mg total) by mouth every 8 (eight) hours as needed for nausea or vomiting. 09/16/22   Fayrene Helper, PA-C  potassium chloride SA (KLOR-CON M) 20 MEQ tablet TAKE 1 TABLET EVERY DAY 09/25/22   Copland, Gwenlyn Found, MD  predniSONE (DELTASONE) 10 MG tablet Take 20 mg daily for 3 days, then 10 mg daily for 3 days 12/20/22   Copland, Gwenlyn Found, MD  Probiotic Product (PROBIOTIC DAILY PO) Take 1 capsule by mouth daily.    [provider]  TRESIBA FLEXTOUCH 100 UNIT/ML FlexTouch Pen INJECT 60 UNITS UNDER THE SKIN AT BEDTIME 11/24/22   Copland, Gwenlyn Found, MD  valACYclovir (VALTREX) 1000 MG tablet Take 1 tablet (1,000 mg total) by mouth 3 (three) times daily. 12/12/22   Charlynne Pander, MD  zinc gluconate 50 MG tablet Take 50 mg by mouth at bedtime.    [provider]    Allergies   Allergies  Allergen Reactions   Aspirin     Per pt any pain  medications - causes fluid build up    Bee Venom Swelling   Ibuprofen Swelling   Ozempic (0.25 Or 0.5 Mg-Dose) [Semaglutide(0.25 Or 0.5mg -Dos)] Diarrhea and Nausea And Vomiting    Flatulence   Poison Oak Extract Itching and Swelling   Statins     "skin feels like bugs under the skin"   Trulicity [Dulaglutide] Nausea And Vomiting and Other (See Comments)    Flatulance, "deathly sick"   Tylenol [Acetaminophen] Swelling   Victoza [Liraglutide] Other (See Comments)    Stomach upset issues     Review of Systems  ROS  Neurologic Exam  Awake, alert, oriented Memory and concentration grossly intact Speech fluent, appropriate CN grossly intact Motor exam: Upper Extremities Deltoid Bicep Tricep Grip  Right 5/5 5/5 5/5 5/5  Left 5/5 5/5 5/5 5/5   Lower Extremities IP Quad PF DF EHL  Right 5/5 5/5 5/5 5/5 5/5  Left 5/5 5/5 5/5 5/5 5/5   Sensation grossly intact to LT   Impression  - 77 y.o. female approximately six months s/p elective left Pcom aneurysm coiling, doing well  Plan  - Proceed with routine short-term angiographic follow-up  I have reviewed the indications for the procedure as well as the details of the procedure and the expected postoperative course and recovery at length with the patient in the office. We have also reviewed in detail the risks, benefits, and alternatives to the procedure. All questions were answered and JALAIYAH VIVIANI provided informed consent to proceed.  Lisbeth Renshaw, MD [facility]

## 2023-05-16 ENCOUNTER — Other Ambulatory Visit: Payer: Self-pay | Admitting: Neurosurgery

## 2023-05-16 ENCOUNTER — Encounter (HOSPITAL_COMMUNITY): Payer: Self-pay

## 2023-05-16 DIAGNOSIS — I671 Cerebral aneurysm, nonruptured: Secondary | ICD-10-CM

## 2023-05-21 DIAGNOSIS — D485 Neoplasm of uncertain behavior of skin: Secondary | ICD-10-CM | POA: Diagnosis not present

## 2023-05-21 DIAGNOSIS — L814 Other melanin hyperpigmentation: Secondary | ICD-10-CM | POA: Diagnosis not present

## 2023-05-21 DIAGNOSIS — Z85828 Personal history of other malignant neoplasm of skin: Secondary | ICD-10-CM | POA: Diagnosis not present

## 2023-05-21 DIAGNOSIS — L820 Inflamed seborrheic keratosis: Secondary | ICD-10-CM | POA: Diagnosis not present

## 2023-05-21 DIAGNOSIS — L821 Other seborrheic keratosis: Secondary | ICD-10-CM | POA: Diagnosis not present

## 2023-05-21 DIAGNOSIS — C4401 Basal cell carcinoma of skin of lip: Secondary | ICD-10-CM | POA: Diagnosis not present

## 2023-05-21 DIAGNOSIS — D230 Other benign neoplasm of skin of lip: Secondary | ICD-10-CM | POA: Diagnosis not present

## 2023-05-21 DIAGNOSIS — D692 Other nonthrombocytopenic purpura: Secondary | ICD-10-CM | POA: Diagnosis not present

## 2023-05-23 DIAGNOSIS — I671 Cerebral aneurysm, nonruptured: Secondary | ICD-10-CM | POA: Diagnosis not present

## 2023-06-06 DIAGNOSIS — H02831 Dermatochalasis of right upper eyelid: Secondary | ICD-10-CM | POA: Diagnosis not present

## 2023-06-06 DIAGNOSIS — Z961 Presence of intraocular lens: Secondary | ICD-10-CM | POA: Diagnosis not present

## 2023-06-06 DIAGNOSIS — H02834 Dermatochalasis of left upper eyelid: Secondary | ICD-10-CM | POA: Diagnosis not present

## 2023-06-06 DIAGNOSIS — H04123 Dry eye syndrome of bilateral lacrimal glands: Secondary | ICD-10-CM | POA: Diagnosis not present

## 2023-06-06 DIAGNOSIS — E119 Type 2 diabetes mellitus without complications: Secondary | ICD-10-CM | POA: Diagnosis not present

## 2023-06-06 DIAGNOSIS — G510 Bell's palsy: Secondary | ICD-10-CM | POA: Diagnosis not present

## 2023-06-06 LAB — HM DIABETES EYE EXAM

## 2023-06-20 NOTE — Progress Notes (Signed)
[facility] [address] [place], [place] [address] [phone]  Date:  06/25/2023   Name:  Mandy Kim   DOB:  07/27/1946   MRN:  [id number]  PCP:  Pearline Cables, MD    Chief Complaint: 5-6 month follow up (Concerns/ questions: 1. soreness in the feet. 2. Bells Palsy effecting the L side of her face. /AWV due/Shingrix due- Medicare pt)   History of Present Illness:  Mandy Kim is a 77 y.o. very pleasant female patient who presents with the following:  Patient visit today for periodic follow-up Most recent visit with myself was in February  History of poorly controlled diabetes, fatty liver, AAA status post repair 2013, hyperlipidemia, cervical cancer status post hysterectomy year 2000, bilateral breast cancer status post double mastectomy ,hypothyroidism  Recent history of C. Difficile She had an elective coil embolization of a LICA aneurysm October 2023-Dr.Nundkumar last month: Mrs. Mandy Kim is a 77 year old woman seen in follow-up. We treated electively a left-sided posterior communicating artery aneurysm about six months ago with coil embolization. Patient has done very well since then. She recently underwent routine short-term angiographic follow-up and comes in today to review the results. To review, the patient does have a history of aortic stent and is maintained on Plavix.   Under nephrology care, Dr. Rosemarie Ax recent visit was in March  She had Bell's palsy in January of this year She still feels like her left cheek "swells" some of the time-she wonders if there is treatment for this.  Explained there is not any great treatment that I am aware of for this feeling after chronic Bell's palsy  She stopped taking metformin as she thought it might be making her feet swell She stopped using it a month and she feels like her feet are improved althoguh they are still sore She has some  hyperpigmentation, likely due to chronic venous stasis in both legs.  She is still taking 60 of insulin- but notes that "sometimes I don't even need it" as she is using a "diabetic tea"- she might take less than the rx amount if her glucose is lower   Lab Results  Component Value Date   HGBA1C 8.4 (H) 01/15/2023   She notes she is working on losing [employer] Readings from Last 3 Encounters:  06/25/23 175 lb 3.2 oz (79.5 kg)  05/15/23 170 lb (77.1 kg)  01/15/23 179 lb (81.2 kg)     Patient Active Problem List   Diagnosis Date Noted   Cerebral aneurysm 10/03/2022   BCC (basal cell carcinoma), face 05/18/2022   Carpal tunnel syndrome, bilateral 05/18/2022   Colon polyp 05/18/2022   Herniated intervertebral disk 05/18/2022   Hypokalemia 05/18/2022   Intermittent claudication (HCC) 05/18/2022   Intracranial aneurysm 05/18/2022   Wears glasses 05/16/2022   Stress incontinence due to pelvic organ prolapse 05/16/2022   PONV (postoperative nausea and vomiting) 05/16/2022   OSA on CPAP 05/16/2022   History of diverticulitis of colon 05/16/2022   History of bilateral breast cancer 05/16/2022   History of basal cell carcinoma excision 05/16/2022   Hiatal hernia 05/16/2022   Diverticulitis 05/16/2022   CKD (chronic kidney disease) 05/16/2022   Chronic cystitis 05/16/2022   Cancer (HCC) 05/16/2022   Anticoagulant long-term use 05/16/2022   Hypoxia 05/05/2022   Acute diarrhea 05/04/2022   Mixed hyperlipidemia due to type 2 diabetes mellitus (HCC) 05/04/2022   Essential hypertension 05/04/2022  GERD without esophagitis 05/04/2022   AKI (acute kidney injury) (HCC) 05/04/2022   Hypothyroidism 05/04/2022   Kim stools 05/04/2022   Myositis, unspecified 04/27/2020   Prolapse of female pelvic organs 05/24/2019   Cystocele with prolapse 05/23/2019   Acute respiratory failure with hypoxia (HCC)    Sepsis (HCC) 05/28/2017   Elevated LFTs 05/28/2017   Acute lower UTI 05/28/2017    Dehydration 05/28/2017   Peripheral neuropathy 07/27/2014   Rectocele 04/02/2013   Urge incontinence 04/02/2013   Urinary tract infection, site not specified 04/02/2013   Urinary urgency 04/02/2013   AAA (abdominal aortic aneurysm) (HCC) 02/12/2012   Fatty liver 02/12/2012   Breast cancer (HCC) 02/12/2012   Cervical cancer (HCC) 02/12/2012   Type 2 diabetes mellitus with diabetic polyneuropathy, with long-term current use of insulin (HCC) 02/12/2012   Hyperlipidemia 02/12/2012   S/P AAA (abdominal aortic aneurysm) repair 09/15/2011   Impaired glucose tolerance 06/26/2011   History of cervical cancer 2000   CHF (congestive heart failure) (HCC) 2000    Past Medical History:  Diagnosis Date   AAA (abdominal aortic aneurysm) (HCC) 02/12/2012   She is a pt of vascular surgery in [place]- she had a repair done in approx 2013.    Annual [place], stent has been in good position   Acute diarrhea 05/04/2022   Acute lower UTI 05/28/2017   Acute respiratory failure with hypoxia (HCC)    AKI (acute kidney injury) (HCC) 05/04/2022   Anticoagulant long-term use    plavix   Arthritis    Breast cancer (HCC) 02/12/2012   Double mastectomy, 2005.  Now cured   Cancer (HCC)    CHF (congestive heart failure) (HCC) 2000   Chronic cystitis    Chronic kidney disease    Cystocele with prolapse 05/23/2019   Kim stools 05/04/2022   Dehydration 05/28/2017   Diverticulitis    Elevated LFTs 05/28/2017   Essential hypertension 05/04/2022   Fatty liver 02/12/2012   GERD (gastroesophageal reflux disease)    GERD without esophagitis 05/04/2022   Hiatal hernia    History of basal cell carcinoma excision    face/ nose right side   History of bilateral breast cancer followed by oncologist until 2008,  per pt no recurrence   right 2002  and left 2003 s/p  mastectomy with completed chemo and tamexifen therapy    History of cervical cancer 2000   s/p  TAH w/ BSO   History of diverticulitis of colon     Hyperlipidemia    Hypothyroidism    Hypoxia 05/05/2022   Mixed hyperlipidemia due to type 2 diabetes mellitus (HCC) 05/04/2022   OSA on CPAP    PAD (peripheral artery disease) (HCC)    Peripheral neuropathy    PONV (postoperative nausea and vomiting)    Prolapse of female pelvic organs 05/24/2019   S/P AAA (abdominal aortic aneurysm) repair 09/15/2011   Sepsis (HCC) 05/28/2017   Stress incontinence due to pelvic organ prolapse    Type 2 diabetes mellitus treated with insulin (HCC)    followed by pcp   Type 2 diabetes mellitus with diabetic polyneuropathy, with long-term current use of insulin (HCC) 02/12/2012   Wears glasses     Past Surgical History:  Procedure Laterality Date   ABDOMINAL HYSTERECTOMY  2000   w/  BSO,  and Bladder tacking abdominally   CARPAL TUNNEL RELEASE Left 1999   CATARACT EXTRACTION W/ INTRAOCULAR LENS  IMPLANT, BILATERAL  2015   ENDOVASCULAR REPAIR/STENT GRAFT  09-15-2011    @  [facility]   INCONTINENCE SURGERY  2007  approx.   IR ANGIO INTRA EXTRACRAN SEL COM CAROTID INNOMINATE UNI L MOD SED  05/15/2023   IR ANGIO INTRA EXTRACRAN SEL INTERNAL CAROTID UNI L MOD SED  04/11/2022   IR ANGIO INTRA EXTRACRAN SEL INTERNAL CAROTID UNI L MOD SED  10/03/2022   IR ANGIO VERTEBRAL SEL VERTEBRAL UNI R MOD SED  04/11/2022   IR ANGIOGRAM FOLLOW UP STUDY  10/03/2022   IR ANGIOGRAM FOLLOW UP STUDY  10/03/2022   IR ANGIOGRAM FOLLOW UP STUDY  10/03/2022   IR NEURO EACH ADD'L AFTER BASIC UNI LEFT (MS)  10/03/2022   IR TRANSCATH/EMBOLIZ  10/03/2022   IR US GUIDE VASC ACCESS RIGHT  04/11/2022   IR US GUIDE VASC ACCESS RIGHT  05/15/2023   MASTECTOMY Bilateral right 2002;  left 2003   lymph node dissection done only on right side   RADIOLOGY WITH ANESTHESIA N/A 10/03/2022   Procedure: 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;  Surgeon: Lisbeth Renshaw, MD;  Location: [facility] OR;  Service: Radiology;  Laterality: N/A;   ROBOTIC ASSISTED LAPAROSCOPIC SACROCOLPOPEXY  N/A 05/23/2019   Procedure: XI ROBOTIC ASSISTED LAPAROSCOPIC SACROCOLPOPEXY;  Surgeon: Crist Fat, MD;  Location: [facility] ORS;  Service: Urology;  Laterality: N/A;   TONSILLECTOMY  child   TYMPANOPLASTY Right 1970s   "had my ear drum replacement with a plastic one"   WRIST GANGLION EXCISION Left 1998    Social History   Tobacco Use   Smoking status: Former    Current packs/day: 0.00    Types: Cigarettes    Start date: 06/06/1969    Quit date: 06/07/1999    Years since quitting: 24.0   Smokeless tobacco: Never  Vaping Use   Vaping status: Never Used  Substance Use Topics   Alcohol use: Never   Drug use: Never    Family History  Problem Relation Age of Onset   Cancer Mother    Pneumonia Mother    Thyroid disease Mother    Thyroid disease Sister    Obesity Daughter    Hypertension Daughter    Thyroid disease Son    Pneumonia Maternal Grandmother    Heart attack Maternal Grandfather    Diabetes Neg Hx     Allergies  Allergen Reactions   Aspirin     Per pt any pain medications - causes fluid build up    Bee Venom Swelling   Ibuprofen Swelling   Ozempic (0.25 Or 0.5 Mg-Dose) [Semaglutide(0.25 Or 0.5mg -Dos)] Diarrhea and Nausea And Vomiting    Flatulence   Poison Oak Extract Itching and Swelling   Statins     "skin feels like bugs under the skin"   Trulicity [Dulaglutide] Nausea And Vomiting and Other (See Comments)    Flatulance, "deathly sick"   Tylenol [Acetaminophen] Swelling   Victoza [Liraglutide] Other (See Comments)    Stomach upset issues     Medication list has been reviewed and updated.  Current Outpatient Medications on File Prior to Visit  Medication Sig Dispense Refill   ACCU-CHEK AVIVA PLUS test strip TEST BLOOD SUGAR THREE TIMES DAILY 300 strip 12   Accu-Chek Softclix Lancets lancets TEST BLOOD SUGAR THREE TIMES DAILY 300 each 1   Alcohol Swabs (DROPSAFE ALCOHOL PREP) 70 % PADS USE TO TEST BLOOD SUGAR 3 TIMES DAILY. 300 each 2   Alcohol Swabs  PADS Use to test blood sugar 3 times daily. Dx: E11.9 300 each 2   Blood Glucose Calibration (GLUCOSE CONTROL)  SOLN Use to test blood sugar 3 times daily. Dx: E11.9 1 each 2   Blood Glucose Monitoring Suppl (ACCU-CHEK AVIVA PLUS) w/Device KIT USE AS DIRECTED  TO TEST BLOOD GLUCOSE BID E11.9 1 kit 0   Carboxymethylcellul-Glycerin (LUBRICATING EYE DROPS OP) Place 1 drop into both eyes 3 (three) times daily.     clopidogrel (PLAVIX) 75 MG tablet TAKE 1 TABLET EVERY DAY (Patient taking differently: Take 75 mg by mouth every other day.) 90 tablet 10   dapagliflozin propanediol (FARXIGA) 10 MG TABS tablet Take 1 tablet (10 mg total) by mouth daily. 90 tablet 3   famotidine (PEPCID) 20 MG tablet TAKE 1 TABLET EVERY DAY 90 tablet 3   gabapentin (NEURONTIN) 100 MG capsule Take 1 capsule (100 mg total) by mouth 2 (two) times daily. 180 capsule 1   gemfibrozil (LOPID) 600 MG tablet TAKE 1 TABLET TWICE DAILY BEFORE MEALS 180 tablet 3   Ginger, Zingiber officinalis, (GINGER ROOT PO) Take 1 tablet by mouth at bedtime.     glipiZIDE (GLUCOTROL) 5 MG tablet TAKE 1 TABLET TWICE DAILY BEFORE MEALS 180 tablet 3   Insulin Pen Needle 32G X 4 MM MISC Used to inject insulin 1x daily. 100 each 4   Insulin Syringe-Needle U-100 (INSULIN SYRINGE 1CC/30GX5/16") 30G X 5/16" 1 ML MISC Use to inject insulin 1 time per day. 100 each 2   levothyroxine (SYNTHROID) 50 MCG tablet TAKE 1 TABLET EVERY DAY BEFORE BREAKFAST 90 tablet 3   losartan (COZAAR) 50 MG tablet TAKE 1 TABLET EVERY DAY 90 tablet 10   Menthol, Topical Analgesic, (BIOFREEZE ROLL-ON EX) Apply 1 application topically as needed (joint pain).     metFORMIN (GLUCOPHAGE) 1000 MG tablet TAKE 1 TABLET TWICE DAILY WITH MEALS (Patient not taking: Reported on 06/25/2023) 180 tablet 3   niacin (NIASPAN) 500 MG CR tablet Take 1 tablet (500 mg total) by mouth at bedtime. 30 tablet 3   Omega-3 Fatty Acids (FISH OIL) 1000 MG CAPS Take 1,000 mg by mouth 2 (two) times a day.       ondansetron (ZOFRAN) 4 MG tablet Take 1 tablet (4 mg total) by mouth every 8 (eight) hours as needed for nausea or vomiting. 12 tablet 0   potassium chloride SA (KLOR-CON M) 20 MEQ tablet TAKE 1 TABLET EVERY DAY 90 tablet 10   predniSONE (DELTASONE) 10 MG tablet Take 20 mg daily for 3 days, then 10 mg daily for 3 days 9 tablet 0   Probiotic Product (PROBIOTIC DAILY PO) Take 1 capsule by mouth daily.     TRESIBA FLEXTOUCH 100 UNIT/ML FlexTouch Pen INJECT 60 UNITS UNDER THE SKIN AT BEDTIME 60 mL 3   valACYclovir (VALTREX) 1000 MG tablet Take 1 tablet (1,000 mg total) by mouth 3 (three) times daily. 21 tablet 0   zinc gluconate 50 MG tablet Take 50 mg by mouth at bedtime.     No current facility-administered medications on file prior to visit.    Review of Systems:  As per HPI- otherwise negative.   Physical Examination: Vitals:   06/25/23 1412 06/25/23 1434  BP: (!) 140/80   Pulse: 78   Resp: 18   Temp: 97.9 F (36.6 C)   SpO2: (!) 89% 93%   Vitals:   06/25/23 1412  Weight: 175 lb 3.2 oz (79.5 kg)  Height: 5\' 4"  (1.626 m)   Body mass index is 30.07 kg/m. Ideal Body Weight: Weight in (lb) to have BMI = 25: 145.3  GEN: no acute  distress.  Obese, looks her normal self HEENT: Atraumatic, Normocephalic.  Ears and Nose: No external deformity. CV: RRR, No M/G/R. No JVD. No thrill. No extra heart sounds. PULM: CTA B, no wheezes, crackles, rhonchi. No retractions. No resp. distress. No accessory muscle use. ABD: S, NT, ND, +BS. No rebound. No HSM. EXTR: No c/c/e PSYCH: Normally interactive. Conversant.   Noted low oxygen- pt states no sx  On recheck normal Assessment and Plan: Type 2 diabetes mellitus with diabetic polyneuropathy, with long-term current use of insulin (HCC) - Plan: Hemoglobin A1c, Comprehensive metabolic panel  Essential hypertension - Plan: Comprehensive metabolic panel  Fatty liver - Plan: Comprehensive metabolic panel  Acquired hypothyroidism - Plan:  TSH  Patient seen today for follow-up.  As above, she tends to have inconsistent compliance with her diabetes medications.  We will follow-up on her A1c today Blood pressures under reasonable control Follow-up on LFTs, thyroid today  Signed Abbe Amsterdam, MD  Received labs 7/23- letter to pt She already stopped metformin which is fine  I called her and let her know to stay off metformin I will call [facility] tomorrow and let them know she needs to be seen   Results for orders placed or performed in visit on 06/25/23  Hemoglobin A1c  Result Value Ref Range   Hgb A1c MFr Bld 7.5 (H) 4.6 - 6.5 %  Comprehensive metabolic panel  Result Value Ref Range   Sodium 137 135 - 145 mEq/L   Potassium 4.5 3.5 - 5.1 mEq/L   Chloride 103 96 - 112 mEq/L   CO2 24 19 - 32 mEq/L   Glucose, Bld 121 (H) 70 - 99 mg/dL   BUN 41 (H) 6 - 23 mg/dL   Creatinine, Ser 1.91 (H) 0.40 - 1.20 mg/dL   Total Bilirubin 0.5 0.2 - 1.2 mg/dL   Alkaline Phosphatase 30 (L) 39 - 117 U/L   AST 40 (H) 0 - 37 U/L   ALT 36 (H) 0 - 35 U/L   Total Protein 7.5 6.0 - 8.3 g/dL   Albumin 4.7 3.5 - 5.2 g/dL   GFR 47.82 (L) >95.62 mL/min   Calcium 10.0 8.4 - 10.5 mg/dL  TSH  Result Value Ref Range   TSH 2.61 0.35 - 5.50 uIU/mL   I called [facility] today and left a detailed message with clinical assistant for Dr. Kathrene Bongo, asking them to please bring the patient in for follow-up

## 2023-06-25 ENCOUNTER — Ambulatory Visit (INDEPENDENT_AMBULATORY_CARE_PROVIDER_SITE_OTHER): Payer: Medicare HMO | Admitting: Family Medicine

## 2023-06-25 ENCOUNTER — Encounter: Payer: Self-pay | Admitting: Family Medicine

## 2023-06-25 VITALS — BP 140/90 | HR 78 | Temp 97.9°F | Resp 18 | Ht 64.0 in | Wt 175.2 lb

## 2023-06-25 DIAGNOSIS — I10 Essential (primary) hypertension: Secondary | ICD-10-CM | POA: Diagnosis not present

## 2023-06-25 DIAGNOSIS — K760 Fatty (change of) liver, not elsewhere classified: Secondary | ICD-10-CM

## 2023-06-25 DIAGNOSIS — E1142 Type 2 diabetes mellitus with diabetic polyneuropathy: Secondary | ICD-10-CM | POA: Diagnosis not present

## 2023-06-25 DIAGNOSIS — E039 Hypothyroidism, unspecified: Secondary | ICD-10-CM

## 2023-06-25 DIAGNOSIS — Z794 Long term (current) use of insulin: Secondary | ICD-10-CM

## 2023-06-25 NOTE — Patient Instructions (Signed)
For allergies- I would suggest trying claritin or zyrtec OTC I will be in touch with your labs asap  Please plan to see me in about 6 months assuming all is well

## 2023-06-26 LAB — COMPREHENSIVE METABOLIC PANEL
ALT: 36 U/L — ABNORMAL HIGH (ref 0–35)
AST: 40 U/L — ABNORMAL HIGH (ref 0–37)
Albumin: 4.7 g/dL (ref 3.5–5.2)
Alkaline Phosphatase: 30 U/L — ABNORMAL LOW (ref 39–117)
BUN: 41 mg/dL — ABNORMAL HIGH (ref 6–23)
CO2: 24 mEq/L (ref 19–32)
Calcium: 10 mg/dL (ref 8.4–10.5)
Chloride: 103 mEq/L (ref 96–112)
Creatinine, Ser: 2.02 mg/dL — ABNORMAL HIGH (ref 0.40–1.20)
GFR: 23.18 mL/min — ABNORMAL LOW (ref >60.00)
Glucose, Bld: 121 mg/dL — ABNORMAL HIGH (ref 70–99)
Potassium: 4.5 mEq/L (ref 3.5–5.1)
Sodium: 137 mEq/L (ref 135–145)
Total Bilirubin: 0.5 mg/dL (ref 0.2–1.2)
Total Protein: 7.5 g/dL (ref 6.0–8.3)

## 2023-06-26 LAB — TSH: TSH: 2.61 u[IU]/mL (ref 0.35–5.50)

## 2023-06-26 LAB — HEMOGLOBIN A1C: Hgb A1c MFr Bld: 7.5 % — ABNORMAL HIGH (ref 4.6–6.5)

## 2023-06-26 NOTE — Addendum Note (Signed)
Addended by: Abbe Amsterdam C on: 06/26/2023 05:08 PM   Modules accepted: Orders

## 2023-07-05 DIAGNOSIS — C4401 Basal cell carcinoma of skin of lip: Secondary | ICD-10-CM | POA: Diagnosis not present

## 2023-07-12 DIAGNOSIS — L905 Scar conditions and fibrosis of skin: Secondary | ICD-10-CM | POA: Diagnosis not present

## 2023-07-31 ENCOUNTER — Telehealth: Payer: Self-pay | Admitting: Family Medicine

## 2023-07-31 NOTE — Telephone Encounter (Signed)
Pt called stating that she had a discussion with Dr. Patsy Lager that she would have an appointment with Dr. Layla Barter to examine her Kidneys. Pt is unsure of the name of the office. Please call & advise pt regarding this conversation she previously had with Dr. Patsy Lager.

## 2023-08-01 NOTE — Telephone Encounter (Signed)
Please advise 

## 2023-08-01 NOTE — Telephone Encounter (Signed)
Called her back- I called [facility] back in July and asked them to schedule pt but she reports she has not heard from them yet   I called [facility] again, was transferred to an answering machine for the nursing staff, left the patient details and asked them to please call her and get her scheduled for an appointment

## 2023-08-03 NOTE — Telephone Encounter (Signed)
Called and confirmed she does have an appt for 9/10

## 2023-08-14 DIAGNOSIS — N1832 Chronic kidney disease, stage 3b: Secondary | ICD-10-CM | POA: Diagnosis not present

## 2023-08-14 DIAGNOSIS — I129 Hypertensive chronic kidney disease with stage 1 through stage 4 chronic kidney disease, or unspecified chronic kidney disease: Secondary | ICD-10-CM | POA: Diagnosis not present

## 2023-08-14 DIAGNOSIS — E1122 Type 2 diabetes mellitus with diabetic chronic kidney disease: Secondary | ICD-10-CM | POA: Diagnosis not present

## 2023-08-15 LAB — LAB REPORT - SCANNED
Creatinine, POC: 51.9 mg/dL
EGFR: 31

## 2023-08-22 ENCOUNTER — Other Ambulatory Visit: Payer: Self-pay | Admitting: Family Medicine

## 2023-08-24 ENCOUNTER — Telehealth: Payer: Self-pay | Admitting: Family Medicine

## 2023-08-24 NOTE — Telephone Encounter (Signed)
Pt called & requested for a new order to be sent in for her CPAP machine & a new mask. Please advise pt.

## 2023-08-28 ENCOUNTER — Telehealth: Payer: Self-pay | Admitting: Family Medicine

## 2023-08-28 DIAGNOSIS — G4733 Obstructive sleep apnea (adult) (pediatric): Secondary | ICD-10-CM

## 2023-08-28 NOTE — Telephone Encounter (Signed)
Pt called and wanted to discuss with pcp about when she had a sleep study done in the past and where she had it done. Please call and advise pt.

## 2023-08-28 NOTE — Telephone Encounter (Signed)
Agree.  I did find a sleep medicine consult note on care everywhere from 2012, but I cannot see the associated sleep study note unfortunately  Please call her and let her know, thank you

## 2023-08-28 NOTE — Telephone Encounter (Signed)
Pt says her last CPAP machine was supplied by [facility].   Last sleep study was [facility] around 2011. Her CPAP machine has a notice reading "needing serviced" [facility] no longer services the machines per the pt.   She is needing a mask strap, so far the companies that she has called are needing a credit card. She does not have one, so she is trying to find a company that will let her use cash.

## 2023-08-28 NOTE — Telephone Encounter (Signed)
I am unable to locate this in her notes. Can you please check behind me?

## 2023-08-31 ENCOUNTER — Telehealth: Payer: Self-pay | Admitting: Family Medicine

## 2023-08-31 NOTE — Telephone Encounter (Signed)
Duplicate task. Please refer to other thread.

## 2023-08-31 NOTE — Telephone Encounter (Signed)
Pt said she needs the thing that goes around her head that attaches to her cpap mask. Pt said they have called all of the medical supply places and they won't let her have another one. She said that she has to have another sleep study done and wanted to see if Dr. Patsy Lager could order another one for her. Please call to advise.

## 2023-09-01 NOTE — Addendum Note (Signed)
Addended by: Abbe Amsterdam C on: 09/01/2023 10:57 AM   Modules accepted: Orders

## 2023-09-07 NOTE — Telephone Encounter (Signed)
Are you able to check on the progress of this referral?

## 2023-09-17 ENCOUNTER — Other Ambulatory Visit: Payer: Self-pay | Admitting: Family Medicine

## 2023-09-17 DIAGNOSIS — E1142 Type 2 diabetes mellitus with diabetic polyneuropathy: Secondary | ICD-10-CM

## 2023-09-19 DIAGNOSIS — I7143 Infrarenal abdominal aortic aneurysm, without rupture: Secondary | ICD-10-CM | POA: Diagnosis not present

## 2023-09-19 DIAGNOSIS — Z48812 Encounter for surgical aftercare following surgery on the circulatory system: Secondary | ICD-10-CM | POA: Diagnosis not present

## 2023-09-27 ENCOUNTER — Ambulatory Visit: Payer: Medicare HMO | Admitting: Neurology

## 2023-09-27 VITALS — BP 123/71 | HR 74 | Ht 64.0 in | Wt 178.8 lb

## 2023-09-27 DIAGNOSIS — I5023 Acute on chronic systolic (congestive) heart failure: Secondary | ICD-10-CM

## 2023-09-27 DIAGNOSIS — R0902 Hypoxemia: Secondary | ICD-10-CM

## 2023-09-27 DIAGNOSIS — Z8669 Personal history of other diseases of the nervous system and sense organs: Secondary | ICD-10-CM | POA: Diagnosis not present

## 2023-09-27 DIAGNOSIS — I739 Peripheral vascular disease, unspecified: Secondary | ICD-10-CM | POA: Diagnosis not present

## 2023-09-27 DIAGNOSIS — I671 Cerebral aneurysm, nonruptured: Secondary | ICD-10-CM

## 2023-09-27 DIAGNOSIS — K08109 Complete loss of teeth, unspecified cause, unspecified class: Secondary | ICD-10-CM

## 2023-09-27 NOTE — Patient Instructions (Signed)
Screening for Sleep Apnea  Sleep apnea is a condition in which breathing pauses or becomes shallow during sleep. Sleep apnea screening is a test to determine if you are at risk for sleep apnea. The test includes a series of questions. It will only takes a few minutes. Your health care provider may ask you to have this test in preparation for surgery or as part of a physical exam. What are the symptoms of sleep apnea? Common symptoms of sleep apnea include: Snoring. Waking up often at night. Daytime sleepiness. Pauses in breathing. Choking or gasping during sleep. Irritability. Forgetfulness. Trouble thinking clearly. Depression. Personality changes. Most people with sleep apnea do not know that they have it. What are the advantages of sleep apnea screening? Getting screened for sleep apnea can help: Ensure your safety. It is important for your health care providers to know whether or not you have sleep apnea, especially if you are having surgery or have other long-term (chronic) health conditions. Improve your health and allow you to get a better night's rest. Restful sleep can help you: Have more energy. Lose weight. Improve high blood pressure. Improve diabetes management. Prevent stroke. Prevent car accidents. What happens during the screening? Screening usually includes being asked a list of questions about your sleep quality. Some questions you may be asked include: Do you snore? Is your sleep restless? Do you have daytime sleepiness? Has a partner or spouse told you that you stop breathing during sleep? Have you had trouble concentrating or memory loss? What is your age? What is your neck circumference? To measure your neck, keep your back straight and gently wrap the tape measure around your neck. Put the tape measure at the middle of your neck, between your chin and collarbone. What is your sex assigned at birth? Do you have or are you being treated for high blood  pressure? If your screening test is positive, you are at risk for the condition. Further testing may be needed to confirm a diagnosis of sleep apnea. Where to find more information You can find screening tools online or at your health care clinic. For more information about sleep apnea screening and healthy sleep, visit these websites: Centers for Disease Control and Prevention: www.cdc.gov American Sleep Apnea Association: www.sleepapnea.org Contact a health care provider if: You think that you may have sleep apnea. Summary Sleep apnea screening can help determine if you are at risk for sleep apnea. It is important for your health care providers to know whether or not you have sleep apnea, especially if you are having surgery or have other chronic health conditions. You may be asked to take a screening test for sleep apnea in preparation for surgery or as part of a physical exam. This information is not intended to replace advice given to you by your health care provider. Make sure you discuss any questions you have with your health care provider. Document Revised: 10/29/2020 Document Reviewed: 10/29/2020 [employer] Patient Education  2024 [employer]  

## 2023-09-27 NOTE — Progress Notes (Signed)
SLEEP MEDICINE CLINIC    Provider:  Melvyn Novas, MD  Primary Care Physician:  Pearline Cables, MD [address] [place] [address]     Referring Provider: Pearline Cables, Md [address] [place],  [place] [address]          Chief Complaint according to patient   Patient presents with:     New Patient (Initial Visit)           HISTORY OF PRESENT ILLNESS:   Chief concern according to patient :  "I need a new machine - machine is at end of lifespan" .  Mandy Kim is a 77 y.o. female patient who is seen upon referral on 09/27/2023 from PCP for a TOC for OSA,  OSA : she had been dx in 1998 , last sleep study 2011, and that's the age of her machine.    I have the pleasure of seeing Mandy Kim on 09/27/23 a right-handed caucasian female with a history of OSA sleep disorder.        Sleep relevant medical history: [name] states that she has been snoring again now that she could not use her CPAP machine because of lack of supplies, she has a very old Mirage Quatro medium size mask a fullface mask and worn-out headgear, her machine now displays a sigh assemble but says it is ready to quit working.   She has used the machine for couple of days over the last 90 days so she made a valid attempt and that gave me some interesting data.  Her machine is an Dentist it is set at 13 cm water pressure with 3 cm EPR and her residual AHI is 0.2/h she has very very high air leakage of course.  The low AHI however is intriguing-she can't sleep well without CPAP.    The patient has a history of poorly controlled diabetes mellitus, and nonalcoholic fatty liver, she is status post repair of an abdominal aortic aneurysm in 2013, has remained hyperlipidemic, she had cervical cancer and hysterectomy a year 2000, bilateral breast cancer status post double mastectomy and she is hypothyroid.  She has also had a C. difficile infection.  She had an elective  coil embolization of a aneurysm intracranially left internal carotid artery by Dr. Conchita Paris.  This was a left-sided posterior communicating artery aneurysm.  She is also under the care of a nephrologist, Dr. Carolynn Comment.  She has Bell's palsy in January of this year.  She has chronic venous stasis in both legs with edema, she is taking variable amounts of insulin, she has earlier this year stopped taking metformin,  In February of this year hemoglobin A1c was 8.4, she has a history of hiatal hernia and gastroesophageal reflux disease, elevated LFTs, essential hypertension, congestive heart failure, chronic kidney disease.  She also had been admitted for sepsis in June 2018.    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  The patient used to work in shifts( Chief Technology Officer,) Pets are present. 1 dog  Tobacco use: quit 2000.   ETOH use ; none ,  Caffeine intake in form of Coffee( 2-3 cups a day) Soda( /) Tea ( /) no energy drinks Exercise: none , "3 herniating discs on various levels, cervical and lumbar"  Sleep habits are as follows: The patient's dinner time is between 6-7 PM. The patient goes to bed at 10-11 PM and continues to sleep for 8-9 hours  on CPAP : Now defunct , she wakes for 1-2 bathroom breaks, the first time at 2 AM.   The preferred sleep position is supine, with the support of 1 thin pillow.  Dreams are reportedly frequent/vivid.   The patient wakes up spontaneously/ 6 , and 6  AM is the usual rise time. She reports not feeling refreshed or restored in AM without CPAP  with symptoms such as dry mouth, morning headaches, and residual fatigue.  Naps are taken frequently,  daily - lasting from 45 to 120 minutes and are more refreshing .Marland Kitchen    Review of Systems: Out of a complete 14 system review, the patient complains of only the following  symptoms, and all other reviewed systems are negative.:  Fatigue, sleepiness , snoring, fragmented sleep  How likely are you to doze in the following situations: 0 = not likely, 1 = slight chance, 2 = moderate chance, 3 = high chance   Sitting and Reading? Watching Television? Sitting inactive in a public place (theater or meeting)? As a passenger in a car for an hour without a break? Lying down in the afternoon when circumstances permit? Sitting and talking to someone? Sitting quietly after lunch without alcohol? In a car, while stopped for a few minutes in traffic?   Total = 19/ 24 points   FSS endorsed at 36/ 63 points.   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  Tobacco Use   Smoking status: Former    Current packs/day: 0.00    Types: Cigarettes    Start date: 06/06/1969    Quit date: 06/07/1999    Years since quitting: 24.3   Smokeless tobacco: Never  Vaping Use   Vaping status: Never Used  Substance and Sexual Activity   Alcohol use: Never   Drug use: Never   Sexual activity: Yes    Birth control/protection: Surgical  Other Topics Concern   Not on file  Social History Narrative   Lives with husband in [place].   Occupation: truck Hospital doctor.   Social Determinants of Health   Financial Resource Strain: Not on file  Food Insecurity: No Food Insecurity (10/06/2022)   Hunger Vital Sign    Worried About Running Out of Food in the Last Year: Never true    Ran Out of Food in the Last Year: Never true  Transportation Needs: No Transportation Needs (10/06/2022)   PRAPARE - Administrator, Civil Service (Medical): No    Lack of Transportation (Non-Medical): No  Physical Activity: Not on file  Stress: Not on file  Social  Connections: Unknown (10/30/2022)   [facility]   Social Network    Social Network: Not on file    Family History  Problem Relation Age of Onset   Cancer Mother    Pneumonia Mother    Thyroid disease Mother    Thyroid disease Sister    Obesity Daughter    Hypertension Daughter    Thyroid disease Son  Pneumonia Maternal Grandmother    Heart attack Maternal Grandfather    Diabetes Neg Hx     Past Medical History:  Diagnosis Date   AAA (abdominal aortic aneurysm) (HCC) 02/12/2012   She is a pt of vascular surgery in [place]- she had a repair done in approx 2013.    Annual [place], stent has been in good position   Acute diarrhea 05/04/2022   Acute lower UTI 05/28/2017   Acute respiratory failure with hypoxia (HCC)    AKI (acute kidney injury) (HCC) 05/04/2022   Anticoagulant long-term use    plavix   Arthritis    Breast cancer (HCC) 02/12/2012   Double mastectomy, 2005.  Now cured   Cancer (HCC)    CHF (congestive heart failure) (HCC) 2000   Chronic cystitis    Chronic kidney disease    Cystocele with prolapse 05/23/2019   Dark stools 05/04/2022   Dehydration 05/28/2017   Diverticulitis    Elevated LFTs 05/28/2017   Essential hypertension 05/04/2022   Fatty liver 02/12/2012   GERD (gastroesophageal reflux disease)    GERD without esophagitis 05/04/2022   Hiatal hernia    History of basal cell carcinoma excision    face/ nose right side   History of bilateral breast cancer followed by oncologist until 2008,  per pt no recurrence   right 2002  and left 2003 s/p  mastectomy with completed chemo and tamexifen therapy    History of cervical cancer 2000   s/p  TAH w/ BSO   History of diverticulitis of colon    Hyperlipidemia    Hypothyroidism    Hypoxia 05/05/2022   Mixed hyperlipidemia due to type 2 diabetes mellitus (HCC) 05/04/2022   OSA on CPAP    PAD (peripheral artery disease) (HCC)    Peripheral neuropathy    PONV  (postoperative nausea and vomiting)    Prolapse of female pelvic organs 05/24/2019   S/P AAA (abdominal aortic aneurysm) repair 09/15/2011   Sepsis (HCC) 05/28/2017   Stress incontinence due to pelvic organ prolapse    Type 2 diabetes mellitus treated with insulin (HCC)    followed by pcp   Type 2 diabetes mellitus with diabetic polyneuropathy, with long-term current use of insulin (HCC) 02/12/2012   Wears glasses     Past Surgical History:  Procedure Laterality Date   ABDOMINAL HYSTERECTOMY  2000   w/  BSO,  and Bladder tacking abdominally   CARPAL TUNNEL RELEASE Left 1999   CATARACT EXTRACTION W/ INTRAOCULAR LENS  IMPLANT, BILATERAL  2015   ENDOVASCULAR REPAIR/STENT GRAFT  09-15-2011    @[facility]    INCONTINENCE SURGERY  2007  approx.   IR ANGIO INTRA EXTRACRAN SEL COM CAROTID INNOMINATE UNI L MOD SED  05/15/2023   IR ANGIO INTRA EXTRACRAN SEL INTERNAL CAROTID UNI L MOD SED  04/11/2022   IR ANGIO INTRA EXTRACRAN SEL INTERNAL CAROTID UNI L MOD SED  10/03/2022   IR ANGIO VERTEBRAL SEL VERTEBRAL UNI R MOD SED  04/11/2022   IR ANGIOGRAM FOLLOW UP STUDY  10/03/2022   IR ANGIOGRAM FOLLOW UP STUDY  10/03/2022   IR ANGIOGRAM FOLLOW UP STUDY  10/03/2022   IR NEURO EACH ADD'L AFTER BASIC UNI LEFT (MS)  10/03/2022   IR TRANSCATH/EMBOLIZ  10/03/2022   IR US GUIDE VASC ACCESS RIGHT  04/11/2022   IR US GUIDE VASC ACCESS RIGHT  05/15/2023   MASTECTOMY Bilateral right 2002;  left 2003   lymph node dissection done only on right side   RADIOLOGY  WITH ANESTHESIA N/A 10/03/2022   Procedure: 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;  Surgeon: Lisbeth Renshaw, MD;  Location: [facility] OR;  Service: Radiology;  Laterality: N/A;   ROBOTIC ASSISTED LAPAROSCOPIC SACROCOLPOPEXY N/A 05/23/2019   Procedure: XI ROBOTIC ASSISTED LAPAROSCOPIC SACROCOLPOPEXY;  Surgeon: Crist Fat, MD;  Location: [facility] ORS;  Service: Urology;  Laterality: N/A;   TONSILLECTOMY  child   TYMPANOPLASTY Right 1970s    "had my ear drum replacement with a plastic one"   WRIST GANGLION EXCISION Left 1998     Current Outpatient Medications on File Prior to Visit  Medication Sig Dispense Refill   Accu-Chek Softclix Lancets lancets TEST BLOOD SUGAR THREE TIMES DAILY 300 each 1   Alcohol Swabs (DROPSAFE ALCOHOL PREP) 70 % PADS USE TO TEST BLOOD SUGAR 3 TIMES DAILY. 300 each 2   Alcohol Swabs PADS Use to test blood sugar 3 times daily. Dx: E11.9 300 each 2   Blood Glucose Calibration (GLUCOSE CONTROL) SOLN Use to test blood sugar 3 times daily. Dx: E11.9 1 each 2   Blood Glucose Monitoring Suppl (ACCU-CHEK AVIVA PLUS) w/Device KIT USE AS DIRECTED  TO TEST BLOOD GLUCOSE BID E11.9 1 kit 0   Carboxymethylcellul-Glycerin (LUBRICATING EYE DROPS OP) Place 1 drop into both eyes 3 (three) times daily.     cephALEXin (KEFLEX) 250 MG capsule Take 250 mg by mouth at bedtime.     clopidogrel (PLAVIX) 75 MG tablet TAKE 1 TABLET EVERY DAY (Patient taking differently: Take 75 mg by mouth every other day.) 90 tablet 10   famotidine (PEPCID) 20 MG tablet TAKE 1 TABLET EVERY DAY 90 tablet 3   gabapentin (NEURONTIN) 100 MG capsule Take 1 capsule (100 mg total) by mouth 2 (two) times daily. 180 capsule 1   gemfibrozil (LOPID) 600 MG tablet TAKE 1 TABLET TWICE DAILY BEFORE MEALS 180 tablet 3   glipiZIDE (GLUCOTROL) 5 MG tablet TAKE 1 TABLET TWICE DAILY BEFORE MEALS 180 tablet 3   glucose blood (ACCU-CHEK AVIVA PLUS) test strip TEST BLOOD SUGAR THREE TIMES DAILY 300 strip 12   Insulin Pen Needle 32G X 4 MM MISC Used to inject insulin 1x daily. 100 each 4   Insulin Syringe-Needle U-100 (INSULIN SYRINGE 1CC/30GX5/16") 30G X 5/16" 1 ML MISC Use to inject insulin 1 time per day. 100 each 2   levothyroxine (SYNTHROID) 50 MCG tablet TAKE 1 TABLET EVERY DAY BEFORE BREAKFAST 90 tablet 3   losartan (COZAAR) 50 MG tablet TAKE 1 TABLET EVERY DAY 90 tablet 10   Menthol, Topical Analgesic, (BIOFREEZE ROLL-ON EX) Apply 1 application topically as  needed (joint pain).     niacin (NIASPAN) 500 MG CR tablet Take 1 tablet (500 mg total) by mouth at bedtime. 30 tablet 3   Omega-3 Fatty Acids (FISH OIL) 1000 MG CAPS Take 1,000 mg by mouth 2 (two) times a day.      Probiotic Product (PROBIOTIC DAILY PO) Take 1 capsule by mouth daily.     TRESIBA FLEXTOUCH 100 UNIT/ML FlexTouch Pen INJECT 60 UNITS UNDER THE SKIN AT BEDTIME 60 mL 3   zinc gluconate 50 MG tablet Take 50 mg by mouth at bedtime.     dapagliflozin propanediol (FARXIGA) 10 MG TABS tablet Take 1 tablet (10 mg total) by mouth daily. (Patient not taking: Reported on 09/27/2023) 90 tablet 3   Ginger, Zingiber officinalis, (GINGER ROOT PO) Take 1 tablet by mouth at bedtime. (Patient not taking: Reported on 09/27/2023)     ondansetron [facility])  4 MG tablet Take 1 tablet (4 mg total) by mouth every 8 (eight) hours as needed for nausea or vomiting. (Patient not taking: Reported on 09/27/2023) 12 tablet 0   potassium chloride SA (KLOR-CON M) 20 MEQ tablet TAKE 1 TABLET EVERY DAY (Patient not taking: Reported on 09/27/2023) 90 tablet 10   predniSONE (DELTASONE) 10 MG tablet Take 20 mg daily for 3 days, then 10 mg daily for 3 days (Patient not taking: Reported on 09/27/2023) 9 tablet 0   valACYclovir (VALTREX) 1000 MG tablet Take 1 tablet (1,000 mg total) by mouth 3 (three) times daily. (Patient not taking: Reported on 09/27/2023) 21 tablet 0   No current facility-administered medications on file prior to visit.    Allergies  Allergen Reactions   Aspirin     Per pt any pain medications - causes fluid build up    Bee Venom Swelling   Ibuprofen Swelling   Ozempic (0.25 Or 0.5 Mg-Dose) [Semaglutide(0.25 Or 0.5mg -Dos)] Diarrhea and Nausea And Vomiting    Flatulence   Poison Oak Extract Itching and Swelling   Statins     "skin feels like bugs under the skin"   Trulicity [Dulaglutide] Nausea And Vomiting and Other (See Comments)    Flatulance, "deathly sick"   Tylenol [Acetaminophen] Swelling    Victoza [Liraglutide] Other (See Comments)    Stomach upset issues      DIAGNOSTIC DATA (LABS, IMAGING, TESTING) - I reviewed patient records, labs, notes, testing and imaging myself where available.  Lab Results  Component Value Date   WBC 6.6 05/15/2023   HGB 11.7 (L) 05/15/2023   HCT 35.3 (L) 05/15/2023   MCV 89.1 05/15/2023   PLT 277 05/15/2023      Component Value Date/Time   NA 137 06/25/2023 1442   K 4.5 06/25/2023 1442   CL 103 06/25/2023 1442   CO2 24 06/25/2023 1442   GLUCOSE 121 (H) 06/25/2023 1442   BUN 41 (H) 06/25/2023 1442   CREATININE 2.02 (H) 06/25/2023 1442   CREATININE 0.77 08/18/2020 1325   CALCIUM 10.0 06/25/2023 1442   PROT 7.5 06/25/2023 1442   ALBUMIN 4.7 06/25/2023 1442   AST 40 (H) 06/25/2023 1442   ALT 36 (H) 06/25/2023 1442   ALKPHOS 30 (L) 06/25/2023 1442   BILITOT 0.5 06/25/2023 1442   GFRNONAA 23 (L) 05/15/2023 1000   GFRAA >60 05/24/2019 0544   Lab Results  Component Value Date   CHOL 207 (H) 02/20/2022   HDL 30.60 (L) 02/20/2022   LDLCALC 82 08/18/2015   LDLDIRECT 96.0 02/20/2022   TRIG (H) 02/20/2022    440.0 Triglyceride is over 400; calculations on Lipids are invalid.   CHOLHDL 7 02/20/2022   Lab Results  Component Value Date   HGBA1C 7.5 (H) 06/25/2023   No results found for: "VITAMINB12" Lab Results  Component Value Date   TSH 2.61 06/25/2023    PHYSICAL EXAM:  Today's Vitals   09/27/23 1534  BP: 123/71  Pulse: 74  Weight: 178 lb 12.8 oz (81.1 kg)  Height: 5\' 4"  (1.626 m)   Body mass index is 30.69 kg/m.   Wt Readings from Last 3 Encounters:  09/27/23 178 lb 12.8 oz (81.1 kg)  06/25/23 175 lb 3.2 oz (79.5 kg)  05/15/23 170 lb (77.1 kg)     Ht Readings from Last 3 Encounters:  09/27/23 5\' 4"  (1.626 m)  06/25/23 5\' 4"  (1.626 m)  05/15/23 5\' 4"  (1.626 m)      General: The patient is  awake, alert and appears not in acute distress. The patient is slow in motor and  cognitive function.  Head:  Normocephalic, atraumatic. Neck is supple. Mallampati 3,  edentulous.  neck circumference:16.75 inches . Nasal airflow  patent.  Retrognathia is not seen.  Dental status: none  Cardiovascular:  Regular rate and cardiac rhythm by pulse,  without distended neck veins. Respiratory: Lungs are clear to auscultation.  Skin:  With evidence of ankle edema, and discoloration- bronzed rash. Trunk: The patient's posture is stooped    NEUROLOGIC EXAM: The patient is awake and alert, oriented to place and time.   Memory subjective described as impaired because she is sleepy.   Attention span & concentration ability appears normal.  Speech is slow -fluent,  with dysarthria, dysphonia , not  aphasia.  Mood and affect are appropriate.   Cranial nerves: no loss of smell or taste reported  Pupils are equal and briskly reactive to light. Funduscopic exam deferred.  Extraocular movements in vertical and horizontal planes were intact and without nystagmus. No Diplopia. Visual fields by finger perimetry are intact. Hearing was intact to soft voice and finger rubbing.  Facial sensation intact to fine touch. Facial motor strength is symmetric and tongue and uvula move midline.  Neck ROM : rotation, tilt and flexion extension were normal for age and shoulder shrug was symmetrical.    Motor exam:  Symmetric bulk, tone and ROM.   Normal tone without cog -wheeling, symmetric grip strength .   Sensory:  Fine touch, pinprick and vibration were all reduced on the ankles, feet .    Gait and station: Patient could rise unassisted from a seated position, walked without assistive device. She has orthostatic dizziness, Vertigo - spinning , not lightheadedness.  Toe and heel walk were deferred.  Deep tendon reflexes: in the  upper and lower extremities are symmetric and intact.  Babinski response was deferred .     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  She has not had supplies and her machine is now giving up working.  She is used to a fullface mask Mirage Quatro and medium size but the headgear and the mask have completely worn out in addition the air leak at the bridge of the nose has caused her eyes to be more dry and we may have to look for a different model for her.  She is Her current machine which is over 46 years old has been set at 13 cm water pressure with 3 cm EPR.    What I would like to obtain is an home sleep test to be performed without CPAP and then order an auto titration CPAP for her if she still has apnea which I assume she will.  The home sleep test will be ordered, thedate of today,   New mask for her machine should be an under the nose model of a fullface mask, this patient is edentulous.   I plan to follow up either personally or through our NP within 3-5 months.   I would like to thank Copland, Gwenlyn Found, MD and Copland, Gwenlyn Found, Md [address] [place],  [place] [address] for allowing me to meet with and to  take care of this pleasant patient.    After spending a total time of  45  minutes face to face and additional time for physical and neurologic examination, review of laboratory studies,  personal review of imaging studies, reports and results of other testing and review of referral information / records as far as provided in visit,   Electronically signed by: Melvyn Novas, MD 09/27/2023 3:44 PM  [facility] and [facility] Sleep Board certified by The [employer] of Sleep Medicine and Diplomate of the [employer] of Sleep Medicine. Board certified In Neurology through the ABPN, Fellow of the [employer] of Neurology.

## 2023-10-10 ENCOUNTER — Other Ambulatory Visit: Payer: Self-pay | Admitting: Family Medicine

## 2023-10-10 DIAGNOSIS — E039 Hypothyroidism, unspecified: Secondary | ICD-10-CM

## 2023-10-17 ENCOUNTER — Other Ambulatory Visit: Payer: Self-pay | Admitting: Family Medicine

## 2023-10-17 ENCOUNTER — Telehealth: Payer: Self-pay | Admitting: Neurology

## 2023-10-17 DIAGNOSIS — I10 Essential (primary) hypertension: Secondary | ICD-10-CM

## 2023-10-17 NOTE — Telephone Encounter (Signed)
HST - [employer] no auth req   Patient is scheduled at [facility] for 10/29/23 at 4 pm  Mailed packet to the patient.

## 2023-10-29 ENCOUNTER — Ambulatory Visit (INDEPENDENT_AMBULATORY_CARE_PROVIDER_SITE_OTHER): Payer: Medicare HMO | Admitting: Neurology

## 2023-10-29 DIAGNOSIS — D485 Neoplasm of uncertain behavior of skin: Secondary | ICD-10-CM | POA: Diagnosis not present

## 2023-10-29 DIAGNOSIS — K08109 Complete loss of teeth, unspecified cause, unspecified class: Secondary | ICD-10-CM

## 2023-10-29 DIAGNOSIS — X32XXXS Exposure to sunlight, sequela: Secondary | ICD-10-CM | POA: Diagnosis not present

## 2023-10-29 DIAGNOSIS — L821 Other seborrheic keratosis: Secondary | ICD-10-CM | POA: Diagnosis not present

## 2023-10-29 DIAGNOSIS — G4733 Obstructive sleep apnea (adult) (pediatric): Secondary | ICD-10-CM | POA: Diagnosis not present

## 2023-10-29 DIAGNOSIS — Z8669 Personal history of other diseases of the nervous system and sense organs: Secondary | ICD-10-CM

## 2023-10-29 DIAGNOSIS — L570 Actinic keratosis: Secondary | ICD-10-CM | POA: Diagnosis not present

## 2023-10-29 DIAGNOSIS — Z85828 Personal history of other malignant neoplasm of skin: Secondary | ICD-10-CM | POA: Diagnosis not present

## 2023-10-29 DIAGNOSIS — C44311 Basal cell carcinoma of skin of nose: Secondary | ICD-10-CM | POA: Diagnosis not present

## 2023-10-29 DIAGNOSIS — D1801 Hemangioma of skin and subcutaneous tissue: Secondary | ICD-10-CM | POA: Diagnosis not present

## 2023-10-29 DIAGNOSIS — L814 Other melanin hyperpigmentation: Secondary | ICD-10-CM | POA: Diagnosis not present

## 2023-10-29 DIAGNOSIS — D239 Other benign neoplasm of skin, unspecified: Secondary | ICD-10-CM | POA: Diagnosis not present

## 2023-10-30 NOTE — Progress Notes (Signed)
[facility] JHOSELYN FRANCHINA 77 year old female 1946/10/05   HOME SLEEP TEST REPORT ( by Watch PAT)   STUDY DATE:  10-30-2023   ORDERING CLINICIAN:  REFERRING CLINICIAN: Copland, MD    CLINICAL INFORMATION/HISTORY: 09-27-2023; Chief concern according to patient :  "I need to see a new sleep doctor; needing a new machine - machine is at end of lifespan" .  NEKETA LACE is a 77 y.o. female patient who is seen upon referral on 09/27/2023 from PCP for a TOC for OSA,  OSA : she had been dx in 1998 , last sleep study 2011, and that's the age of her machine.    I have the pleasure of seeing HOLLYANN TORELLO on 09/27/23 a right-handed caucasian female with a history of OSA sleep disorder.        Sleep relevant medical history: [name] states that she has been snoring again now that she could not use her CPAP machine because of lack of supplies, she has a very old Mirage Quatro medium size mask a fullface mask and worn-out headgear, her machine now displays a sigh assemble but says it is ready to quit working.  She has used the machine for couple of days over the last 90 days so she made a valid attempt and that gave me some interesting data.    Her machine is an Dentist ,set at 13 cm water pressure with 3 cm EPR, and her residual AHI is 0.2/h she has very, very high air leakage of course.  The low AHI however is intriguing-she reportedly can't sleep well without CPAP.      The patient has a history of poorly controlled diabetes mellitus, and nonalcoholic fatty liver, she is status post repair of an abdominal aortic aneurysm in 2013, has remained hyperlipidemic, she had cervical cancer and hysterectomy a year 2000, bilateral breast cancer status post double mastectomy and she is hypothyroid.  She has also had a C. difficile infection.  She had an elective coil embolization of a aneurysm intracranially left internal carotid artery by Dr. Conchita Paris.  This was a left-sided posterior  communicating artery aneurysm.  She is also under the care of a nephrologist, Dr. Carolynn Comment.  She has Bell's palsy in January of this year.  She has chronic venous stasis in both legs with edema, she is taking variable amounts of insulin, she has earlier this year stopped taking metformin,   In February of this year hemoglobin A1c was 8.4, she has a history of hiatal hernia and gastroesophageal reflux disease, elevated LFTs, essential hypertension, congestive heart failure, chronic kidney disease.  She also had been admitted for sepsis in June 2018.      Epworth sleepiness score:  19/ 24 points , FSS endorsed at 36/ 63 points. No GDS data were given.    BMI: 30 kg/m   Neck Circumference: 16.75"   FINDINGS:   Sleep Summary:   Total Recording Time (hours, min): 7 hours 46 minutes      Total Sleep Time (hours, min): 6 hours 44 minutes               Percent REM (%):   12.7%    This patient experienced a very short REM sleep latency of less than 30 minutes, at 25 minutes.  Sleep latency overall was 17 minutes.  There were 45 minutes of wakefulness after sleep onset time.  Respiratory Indices by CMS scoring criteria:   Calculated pAHI (per hour):     3.8/h       (by AASM criteria her AHI would be 9.8/h)                 REM pAHI:    8.3/h                   (by AASM and criteria her AHI would be 21.3/h)                          NREM pAHI:    3.1/h                          Supine AHI:     The patient slept 187 minutes on her back with an AHI of 5.5/h following CMS criteria, or 11.6/h following AASM criteria.  The patient slept on her right side with an AHI of 4.4 by CMS criteria on her left side with an AHI of 1.5/h by CMS criteria and prolonged for 23 minutes with 0 AHI.                                            Oxygen Saturation Statistics:   Oxygen Saturation (%) Mean:   91%, which is considered low.             O2 Saturation Range (%):     Between 77% at nadir and 95% saturation                                   O2 Saturation (minutes) <90%:        40.7 minutes or more than 10% of total sleep time in hypoxia   Pulse Rate Statistics:   Pulse Mean (bpm):   67              Pulse Range:      Between 54 and 90 bpm.  Please note that no cardiac rhythm data can be provided through this home sleep test device.           IMPRESSION:  This HST confirms the presence of mild sleep apnea only when AASM criteria are applied, following CMS criteria this patient does no longer have enough sleep apnea to justify CPAP use.  On the other hand she has significant hypoxia or hypoxemia and would probably need oxygen supplementation if she would not use CPAP.  There is an exemption for CPAP dependent patient's that can no longer get restful or restorative sleep without their CPAP when their apnea is characterized as sleep apnea , unspecified, with increased respiratory effort.  I will try to obtain a new CPAP for the patient based on this diagnosis.  If I do not succeed, I would need to refer her to a pulmonologist to be treated for hypoxemia in sleep.   RECOMMENDATION:  There is an exemption for CPAP dependent patient's that can no longer get restful or restorative sleep without their CPAP when their apnea is characterized as sleep apnea , unspecified, with increased respiratory effort.  I will try to obtain a new CPAP for the patient based on this diagnosis.  If I do not succeed, I would need to  refer her to a pulmonologist to be treated for hypoxemia in sleep.    INTERPRETING PHYSICIAN:   Melvyn Novas, MD  [facility] Sleep Board certified by The [employer] of Sleep Medicine and Diplomate of the [employer] of [employer]. Board certified In Neurology through the ABPN, Fellow of the [employer] of [employer].

## 2023-11-07 ENCOUNTER — Other Ambulatory Visit: Payer: Self-pay | Admitting: Family Medicine

## 2023-11-07 DIAGNOSIS — Z5181 Encounter for therapeutic drug level monitoring: Secondary | ICD-10-CM

## 2023-11-09 ENCOUNTER — Emergency Department (HOSPITAL_COMMUNITY)
Admission: EM | Admit: 2023-11-09 | Discharge: 2023-11-10 | Disposition: A | Discharge status: Home / Self Care | Payer: Medicare HMO | Attending: Emergency Medicine | Admitting: Emergency Medicine

## 2023-11-09 ENCOUNTER — Other Ambulatory Visit: Payer: Self-pay

## 2023-11-09 DIAGNOSIS — Z794 Long term (current) use of insulin: Secondary | ICD-10-CM | POA: Diagnosis not present

## 2023-11-09 DIAGNOSIS — E039 Hypothyroidism, unspecified: Secondary | ICD-10-CM | POA: Insufficient documentation

## 2023-11-09 DIAGNOSIS — I130 Hypertensive heart and chronic kidney disease with heart failure and stage 1 through stage 4 chronic kidney disease, or unspecified chronic kidney disease: Secondary | ICD-10-CM | POA: Insufficient documentation

## 2023-11-09 DIAGNOSIS — R109 Unspecified abdominal pain: Secondary | ICD-10-CM | POA: Insufficient documentation

## 2023-11-09 DIAGNOSIS — Z7902 Long term (current) use of antithrombotics/antiplatelets: Secondary | ICD-10-CM | POA: Diagnosis not present

## 2023-11-09 DIAGNOSIS — E1122 Type 2 diabetes mellitus with diabetic chronic kidney disease: Secondary | ICD-10-CM | POA: Insufficient documentation

## 2023-11-09 DIAGNOSIS — R1083 Colic: Secondary | ICD-10-CM

## 2023-11-09 DIAGNOSIS — K8020 Calculus of gallbladder without cholecystitis without obstruction: Secondary | ICD-10-CM | POA: Diagnosis not present

## 2023-11-09 DIAGNOSIS — N189 Chronic kidney disease, unspecified: Secondary | ICD-10-CM | POA: Insufficient documentation

## 2023-11-09 DIAGNOSIS — Z853 Personal history of malignant neoplasm of breast: Secondary | ICD-10-CM | POA: Diagnosis not present

## 2023-11-09 DIAGNOSIS — I509 Heart failure, unspecified: Secondary | ICD-10-CM | POA: Insufficient documentation

## 2023-11-09 DIAGNOSIS — Z79890 Hormone replacement therapy (postmenopausal): Secondary | ICD-10-CM | POA: Diagnosis not present

## 2023-11-09 DIAGNOSIS — Z79899 Other long term (current) drug therapy: Secondary | ICD-10-CM | POA: Diagnosis not present

## 2023-11-09 DIAGNOSIS — Z7984 Long term (current) use of oral hypoglycemic drugs: Secondary | ICD-10-CM | POA: Diagnosis not present

## 2023-11-09 DIAGNOSIS — K5730 Diverticulosis of large intestine without perforation or abscess without bleeding: Secondary | ICD-10-CM | POA: Diagnosis not present

## 2023-11-09 DIAGNOSIS — Z8541 Personal history of malignant neoplasm of cervix uteri: Secondary | ICD-10-CM | POA: Diagnosis not present

## 2023-11-09 DIAGNOSIS — R1084 Generalized abdominal pain: Secondary | ICD-10-CM | POA: Diagnosis not present

## 2023-11-09 LAB — CBC WITH DIFFERENTIAL/PLATELET
Abs Immature Granulocytes: 0.05 10*3/uL (ref 0.00–0.07)
Basophils Absolute: 0.1 10*3/uL (ref 0.0–0.1)
Basophils Relative: 1 %
Eosinophils Absolute: 0.2 10*3/uL (ref 0.0–0.5)
Eosinophils Relative: 3 %
HCT: 38.3 % (ref 36.0–46.0)
Hemoglobin: 12.7 g/dL (ref 12.0–15.0)
Immature Granulocytes: 1 %
Lymphocytes Relative: 36 %
Lymphs Abs: 2.7 10*3/uL (ref 0.7–4.0)
MCH: 29.5 pg (ref 26.0–34.0)
MCHC: 33.2 g/dL (ref 30.0–36.0)
MCV: 89.1 fL (ref 80.0–100.0)
Monocytes Absolute: 0.5 10*3/uL (ref 0.1–1.0)
Monocytes Relative: 6 %
Neutro Abs: 4 10*3/uL (ref 1.7–7.7)
Neutrophils Relative %: 53 %
Platelets: 262 10*3/uL (ref 150–400)
RBC: 4.3 MIL/uL (ref 3.87–5.11)
RDW: 14.9 % (ref 11.5–15.5)
WBC: 7.4 10*3/uL (ref 4.0–10.5)
nRBC: 0 % (ref 0.0–0.2)

## 2023-11-09 LAB — COMPREHENSIVE METABOLIC PANEL
ALT: 52 U/L — ABNORMAL HIGH (ref 0–44)
AST: 56 U/L — ABNORMAL HIGH (ref 15–41)
Albumin: 3.9 g/dL (ref 3.5–5.0)
Alkaline Phosphatase: 42 U/L (ref 38–126)
Anion gap: 10 (ref 5–15)
BUN: 27 mg/dL — ABNORMAL HIGH (ref 8–23)
CO2: 22 mmol/L (ref 22–32)
Calcium: 10.2 mg/dL (ref 8.9–10.3)
Chloride: 103 mmol/L (ref 98–111)
Creatinine, Ser: 1.44 mg/dL — ABNORMAL HIGH (ref 0.44–1.00)
GFR, Estimated: 37 mL/min — ABNORMAL LOW (ref >60)
Glucose, Bld: 213 mg/dL — ABNORMAL HIGH (ref 70–99)
Potassium: 5.2 mmol/L — ABNORMAL HIGH (ref 3.5–5.1)
Sodium: 135 mmol/L (ref 135–145)
Total Bilirubin: 0.5 mg/dL (ref <1.2)
Total Protein: 7.7 g/dL (ref 6.5–8.1)

## 2023-11-09 LAB — LIPASE, BLOOD: Lipase: 54 U/L — ABNORMAL HIGH (ref 11–51)

## 2023-11-09 NOTE — ED Triage Notes (Signed)
Patient reports upper abdominal " cramping" onset this afternoon with emesis , denies diarrhea or fever , she added occasional constipation .

## 2023-11-09 NOTE — ED Provider Triage Note (Signed)
Emergency Medicine Provider Triage Evaluation Note  Mandy Kim , a 77 y.o. female  was evaluated in triage.  Pt complains of epigastric abdominal pain that started around 3pm today. Pain comes and goes. Reports one episode of non-bloody emesis. Denies fever, chills, chest pain, shortness of breath. Reports dealing some episodes of constipation and diarrhea  Review of Systems  Positive:  As above Negative: As above  Physical Exam  BP (!) 160/62 (BP Location: Left Arm)   Pulse 73   Temp 97.7 F (36.5 C) (Oral)   Resp 18   SpO2 91%  Gen:   Awake, no distress   Resp:  Normal effort  MSK:   Moves extremities without difficulty    Medical Decision Making  Medically screening exam initiated at 9:52 PM.  Appropriate orders placed.  Mandy Kim was informed that the remainder of the evaluation will be completed by another provider, this initial triage assessment does not replace that evaluation, and the importance of remaining in the ED until their evaluation is complete.     Arabella Merles, PA-C 11/09/23 2152

## 2023-11-10 ENCOUNTER — Emergency Department (HOSPITAL_COMMUNITY): Payer: Medicare HMO

## 2023-11-10 ENCOUNTER — Other Ambulatory Visit: Payer: Self-pay | Admitting: Family Medicine

## 2023-11-10 DIAGNOSIS — K8020 Calculus of gallbladder without cholecystitis without obstruction: Secondary | ICD-10-CM | POA: Diagnosis not present

## 2023-11-10 DIAGNOSIS — E118 Type 2 diabetes mellitus with unspecified complications: Secondary | ICD-10-CM

## 2023-11-10 DIAGNOSIS — R109 Unspecified abdominal pain: Secondary | ICD-10-CM | POA: Diagnosis not present

## 2023-11-10 DIAGNOSIS — K5730 Diverticulosis of large intestine without perforation or abscess without bleeding: Secondary | ICD-10-CM | POA: Diagnosis not present

## 2023-11-10 LAB — URINALYSIS, ROUTINE W REFLEX MICROSCOPIC
Bacteria, UA: NONE SEEN
Bilirubin Urine: NEGATIVE
Glucose, UA: >=500 mg/dL — AB
Hgb urine dipstick: NEGATIVE
Ketones, ur: NEGATIVE mg/dL
Leukocytes,Ua: NEGATIVE
Nitrite: NEGATIVE
Protein, ur: 30 mg/dL — AB
Specific Gravity, Urine: 1.01 (ref 1.005–1.030)
pH: 6 (ref 5.0–8.0)

## 2023-11-10 MED ORDER — HYOSCYAMINE SULFATE 0.125 MG SL SUBL
0.1250 mg | SUBLINGUAL_TABLET | Freq: Once | SUBLINGUAL | Status: AC
  Administered 2023-11-10: 0.125 mg via ORAL
  Filled 2023-11-10: qty 1

## 2023-11-10 MED ORDER — DICYCLOMINE HCL 20 MG PO TABS: 20.0000 mg | ORAL_TABLET | Freq: Three times a day (TID) | ORAL | 0 refills | Status: AC | PRN

## 2023-11-10 NOTE — ED Provider Notes (Signed)
[place] EMERGENCY DEPARTMENT AT [facility] Provider Note  CSN: [id number] Arrival date & time: 11/09/23 2133  Chief Complaint(s) Abdominal Pain  HPI Mandy Kim is a 77 y.o. female with a past medical history listed below who presents to the emergency department for several hours of intermittent generalized abdominal cramping lasting several minutes at a time resolving on its own.  Pain comes every 10 to 30 minutes at a time.  Patient reports a bout of emesis that was nonbloody nonbilious earlier today.  She denies any diarrhea.  Reports occasional constipation and last bowel movement was just prior to arrival.  Still passing gas.  She denies any fevers or chills.  No cough or congestion.  No urinary symptoms though patient does report a history of chronic cystitis.  The history is provided by the patient.    Past Medical History Past Medical History:  Diagnosis Date   AAA (abdominal aortic aneurysm) (HCC) 02/12/2012   She is a pt of vascular surgery in [place]- she had a repair done in approx 2013.    Annual [place], stent has been in good position   Acute diarrhea 05/04/2022   Acute lower UTI 05/28/2017   Acute respiratory failure with hypoxia (HCC)    AKI (acute kidney injury) (HCC) 05/04/2022   Anticoagulant long-term use    plavix   Arthritis    Breast cancer (HCC) 02/12/2012   Double mastectomy, 2005.  Now cured   Cancer (HCC)    CHF (congestive heart failure) (HCC) 2000   Chronic cystitis    Chronic kidney disease    Cystocele with prolapse 05/23/2019   Dark stools 05/04/2022   Dehydration 05/28/2017   Diverticulitis    Elevated LFTs 05/28/2017   Essential hypertension 05/04/2022   Fatty liver 02/12/2012   GERD (gastroesophageal reflux disease)    GERD without esophagitis 05/04/2022   Hiatal hernia    History of basal cell carcinoma excision    face/ nose right side   History of bilateral breast cancer followed by oncologist until 2008,  per pt no  recurrence   right 2002  and left 2003 s/p  mastectomy with completed chemo and tamexifen therapy    History of cervical cancer 2000   s/p  TAH w/ BSO   History of diverticulitis of colon    Hyperlipidemia    Hypothyroidism    Hypoxia 05/05/2022   Mixed hyperlipidemia due to type 2 diabetes mellitus (HCC) 05/04/2022   OSA on CPAP    PAD (peripheral artery disease) (HCC)    Peripheral neuropathy    PONV (postoperative nausea and vomiting)    Prolapse of female pelvic organs 05/24/2019   S/P AAA (abdominal aortic aneurysm) repair 09/15/2011   Sepsis (HCC) 05/28/2017   Stress incontinence due to pelvic organ prolapse    Type 2 diabetes mellitus treated with insulin (HCC)    followed by pcp   Type 2 diabetes mellitus with diabetic polyneuropathy, with long-term current use of insulin (HCC) 02/12/2012   Wears glasses    Patient Active Problem List   Diagnosis Date Noted   History of sleep apnea 09/27/2023   Edentulous 09/27/2023   Cerebral aneurysm 10/03/2022   BCC (basal cell carcinoma), face 05/18/2022   Carpal tunnel syndrome, bilateral 05/18/2022   Colon polyp 05/18/2022   Herniated intervertebral disk 05/18/2022   Hypokalemia 05/18/2022   Intermittent claudication (HCC) 05/18/2022   Intracranial aneurysm 05/18/2022   Wears glasses 05/16/2022   Stress incontinence due to pelvic organ  prolapse 05/16/2022   PONV (postoperative nausea and vomiting) 05/16/2022   OSA on CPAP 05/16/2022   History of diverticulitis of colon 05/16/2022   History of bilateral breast cancer 05/16/2022   History of basal cell carcinoma excision 05/16/2022   Hiatal hernia 05/16/2022   Diverticulitis 05/16/2022   CKD (chronic kidney disease) 05/16/2022   Chronic cystitis 05/16/2022   Cancer (HCC) 05/16/2022   Anticoagulant long-term use 05/16/2022   Hypoxia 05/05/2022   Acute diarrhea 05/04/2022   Mixed hyperlipidemia due to type 2 diabetes mellitus (HCC) 05/04/2022   Essential hypertension  05/04/2022   GERD without esophagitis 05/04/2022   AKI (acute kidney injury) (HCC) 05/04/2022   Hypothyroidism 05/04/2022   Dark stools 05/04/2022   Myositis, unspecified 04/27/2020   Prolapse of female pelvic organs 05/24/2019   Cystocele with prolapse 05/23/2019   Acute respiratory failure with hypoxia (HCC)    Sepsis (HCC) 05/28/2017   Elevated LFTs 05/28/2017   Acute lower UTI 05/28/2017   Dehydration 05/28/2017   Peripheral neuropathy 07/27/2014   Rectocele 04/02/2013   Urge incontinence 04/02/2013   Urinary tract infection, site not specified 04/02/2013   Urinary urgency 04/02/2013   AAA (abdominal aortic aneurysm) (HCC) 02/12/2012   Fatty liver 02/12/2012   Breast cancer (HCC) 02/12/2012   Cervical cancer (HCC) 02/12/2012   Type 2 diabetes mellitus with diabetic polyneuropathy, with long-term current use of insulin (HCC) 02/12/2012   Hyperlipidemia 02/12/2012   S/P AAA (abdominal aortic aneurysm) repair 09/15/2011   Impaired glucose tolerance 06/26/2011   History of cervical cancer 2000   CHF (congestive heart failure) (HCC) 2000   Home Medication(s) Prior to Admission medications   Medication Sig Start Date End Date Taking? Authorizing Provider  dicyclomine (BENTYL) 20 MG tablet Take 1 tablet (20 mg total) by mouth 3 (three) times daily as needed for up to 5 days (abdominal cramping). 11/10/23 11/15/23 Yes Elif Yonts, Amadeo Garnet, MD  Accu-Chek Softclix Lancets lancets TEST BLOOD SUGAR THREE TIMES DAILY 07/20/21   Copland, Gwenlyn Found, MD  Alcohol Swabs (DROPSAFE ALCOHOL PREP) 70 % PADS USE TO TEST BLOOD SUGAR 3 TIMES DAILY. 07/20/21   Copland, Gwenlyn Found, MD  Alcohol Swabs PADS Use to test blood sugar 3 times daily. Dx: E11.9 01/14/19   Copland, Gwenlyn Found, MD  Blood Glucose Calibration (GLUCOSE CONTROL) SOLN Use to test blood sugar 3 times daily. Dx: E11.9 12/20/15   Copland, Gwenlyn Found, MD  Blood Glucose Monitoring Suppl (ACCU-CHEK AVIVA PLUS) w/Device KIT USE AS DIRECTED  TO  TEST BLOOD GLUCOSE BID E11.9 05/11/21   Copland, Gwenlyn Found, MD  Carboxymethylcellul-Glycerin (LUBRICATING EYE DROPS OP) Place 1 drop into both eyes 3 (three) times daily.    [provider]  cephALEXin (KEFLEX) 250 MG capsule Take 250 mg by mouth at bedtime. 08/29/23   [provider]  clopidogrel (PLAVIX) 75 MG tablet TAKE 1 TABLET EVERY DAY Patient taking differently: Take 75 mg by mouth every other day. 09/18/22   Copland, Gwenlyn Found, MD  dapagliflozin propanediol (FARXIGA) 10 MG TABS tablet Take 1 tablet (10 mg total) by mouth daily. Patient not taking: Reported on 09/27/2023 01/23/23   Copland, Gwenlyn Found, MD  famotidine (PEPCID) 20 MG tablet Take 1 tablet (20 mg total) by mouth daily. 11/08/23   Copland, Gwenlyn Found, MD  gabapentin (NEURONTIN) 100 MG capsule Take 1 capsule (100 mg total) by mouth 2 (two) times daily. 09/17/23   Copland, Gwenlyn Found, MD  gemfibrozil (LOPID) 600 MG tablet TAKE 1 TABLET TWICE  DAILY BEFORE MEALS 04/05/23   Copland, Gwenlyn Found, MD  Ginger, Zingiber officinalis, (GINGER ROOT PO) Take 1 tablet by mouth at bedtime. Patient not taking: Reported on 09/27/2023    [provider]  glipiZIDE (GLUCOTROL) 5 MG tablet TAKE 1 TABLET TWICE DAILY BEFORE MEALS 01/22/23   Copland, Gwenlyn Found, MD  glucose blood (ACCU-CHEK AVIVA PLUS) test strip TEST BLOOD SUGAR THREE TIMES DAILY 08/22/23   Copland, Gwenlyn Found, MD  Insulin Pen Needle 32G X 4 MM MISC Used to inject insulin 1x daily. 10/18/17   Romero Belling, MD  Insulin Syringe-Needle U-100 (INSULIN SYRINGE 1CC/30GX5/16") 30G X 5/16" 1 ML MISC Use to inject insulin 1 time per day. 05/22/17   Romero Belling, MD  levothyroxine (SYNTHROID) 50 MCG tablet TAKE 1 TABLET EVERY DAY BEFORE BREAKFAST 10/10/23   Copland, Gwenlyn Found, MD  losartan (COZAAR) 50 MG tablet Take 1 tablet (50 mg total) by mouth daily. 10/18/23   Copland, Gwenlyn Found, MD  Menthol, Topical Analgesic, (BIOFREEZE ROLL-ON EX) Apply 1 application topically as needed  (joint pain).    [provider]  niacin (NIASPAN) 500 MG CR tablet Take 1 tablet (500 mg total) by mouth at bedtime. 08/25/15   Copland, Gwenlyn Found, MD  Omega-3 Fatty Acids (FISH OIL) 1000 MG CAPS Take 1,000 mg by mouth 2 (two) times a day.     [provider]  ondansetron (ZOFRAN) 4 MG tablet Take 1 tablet (4 mg total) by mouth every 8 (eight) hours as needed for nausea or vomiting. Patient not taking: Reported on 09/27/2023 09/16/22   Fayrene Helper, PA-C  potassium chloride SA (KLOR-CON M) 20 MEQ tablet Take 1 tablet (20 mEq total) by mouth daily. 11/08/23   Copland, Gwenlyn Found, MD  predniSONE (DELTASONE) 10 MG tablet Take 20 mg daily for 3 days, then 10 mg daily for 3 days Patient not taking: Reported on 09/27/2023 12/20/22   Copland, Gwenlyn Found, MD  Probiotic Product (PROBIOTIC DAILY PO) Take 1 capsule by mouth daily.    [provider]  TRESIBA FLEXTOUCH 100 UNIT/ML FlexTouch Pen INJECT 60 UNITS UNDER THE SKIN AT BEDTIME 11/24/22   Copland, Gwenlyn Found, MD  valACYclovir (VALTREX) 1000 MG tablet Take 1 tablet (1,000 mg total) by mouth 3 (three) times daily. Patient not taking: Reported on 09/27/2023 12/12/22   Charlynne Pander, MD  zinc gluconate 50 MG tablet Take 50 mg by mouth at bedtime.    [provider]                                                                                                                                    Allergies Aspirin, Bee venom, Ibuprofen, Ozempic (0.25 or 0.5 mg-dose) [semaglutide(0.25 or 0.5mg -dos)], Poison oak extract, Statins, Trulicity [dulaglutide], Tylenol [acetaminophen], and Victoza [liraglutide]  Review of Systems Review of Systems As noted in HPI  Physical Exam Vital Signs  I have reviewed the triage  vital signs BP (!) 144/60   Pulse 77   Temp 98.5 F (36.9 C)   Resp 17   SpO2 98%   Physical Exam Vitals reviewed.  Constitutional:      General: She is not in acute distress.    Appearance: She is  well-developed. She is not diaphoretic.  HENT:     Head: Normocephalic and atraumatic.     Right Ear: External ear normal.     Left Ear: External ear normal.     Nose: Nose normal.  Eyes:     General: No scleral icterus.    Conjunctiva/sclera: Conjunctivae normal.  Neck:     Trachea: Phonation normal.  Cardiovascular:     Rate and Rhythm: Normal rate and regular rhythm.  Pulmonary:     Effort: Pulmonary effort is normal. No respiratory distress.     Breath sounds: No stridor.  Abdominal:     General: There is distension.     Tenderness: There is no abdominal tenderness. There is no guarding or rebound.  Musculoskeletal:        General: Normal range of motion.     Cervical back: Normal range of motion.  Neurological:     Mental Status: She is alert and oriented to person, place, and time.  Psychiatric:        Behavior: Behavior normal.     ED Results and Treatments Labs (all labs ordered are listed, but only abnormal results are displayed) Labs Reviewed  URINALYSIS, ROUTINE W REFLEX MICROSCOPIC - Abnormal; Notable for the following components:      Result Value   Glucose, UA >=500 (*)    Protein, ur 30 (*)    All other components within normal limits  COMPREHENSIVE METABOLIC PANEL - Abnormal; Notable for the following components:   Potassium 5.2 (*)    Glucose, Bld 213 (*)    BUN 27 (*)    Creatinine, Ser 1.44 (*)    AST 56 (*)    ALT 52 (*)    GFR, Estimated 37 (*)    All other components within normal limits  LIPASE, BLOOD - Abnormal; Notable for the following components:   Lipase 54 (*)    All other components within normal limits  CBC WITH DIFFERENTIAL/PLATELET                                                                                                                         EKG  EKG Interpretation Date/Time:    Ventricular Rate:    PR Interval:    QRS Duration:    QT Interval:    QTC Calculation:   R Axis:      Text Interpretation:          Radiology CT Renal Stone Study  Result Date: 11/10/2023 CLINICAL DATA:  Abdominal pain EXAM: CT ABDOMEN AND PELVIS WITHOUT CONTRAST TECHNIQUE: Multidetector CT imaging of the abdomen and pelvis was performed following the standard protocol without IV contrast. RADIATION DOSE REDUCTION:  This exam was performed according to the departmental dose-optimization program which includes automated exposure control, adjustment of the mA and/or kV according to patient size and/or use of iterative reconstruction technique. COMPARISON:  09/16/2022 FINDINGS: Lower chest: No acute abnormality. Hepatobiliary: Liver is within normal limits. Gallbladder shows tiny gallstones without complicating factors. Pancreas: Unremarkable. No pancreatic ductal dilatation or surrounding inflammatory changes. Spleen: Normal in size without focal abnormality. Adrenals/Urinary Tract: Adrenal glands are within normal limits. Kidneys are well visualized bilaterally. No renal calculi or obstructive changes are noted. The bladder is well distended. Stomach/Bowel: Diverticular change of the colon is noted. No diverticulitis is seen. The appendix is well visualized and within normal limits. Small bowel and stomach are within normal limits. Vascular/Lymphatic: Atherosclerotic calcifications are noted. Aortic stent graft is seen in satisfactory position stable from the prior exam. Reproductive: Status post hysterectomy. No adnexal masses. Other: No abdominal wall hernia or abnormality. No abdominopelvic ascites. Musculoskeletal: No acute or significant osseous findings. IMPRESSION: Cholelithiasis without complicating factors. Diverticular change without diverticulitis. Aortic stent graft repair stable in appearance. Electronically Signed   By: Alcide Clever M.D.   On: 11/10/2023 03:39    Medications Ordered in ED Medications  hyoscyamine (LEVSIN SL) SL tablet 0.125 mg (0.125 mg Oral Given 11/10/23 0501)   Procedures Procedures  (including  critical care time) Medical Decision Making / ED Course   Medical Decision Making Amount and/or Complexity of Data Reviewed Radiology: ordered.    Abdominal pain differential workup listed below  CBC without leukocytosis or anemia.  Metabolic panel without significant electrolyte derangements.  Patient does have mild hyperglycemia without evidence of DKA.  Mild renal sufficiency without evidence of AKI.  Stable transaminitis without evidence of bili obstruction or pancreatitis.  UA without evidence of infection.  CT scan negative for any intra-abdominal inflammatory/infectious process or bowel obstruction.  No complication to the patient's AAA graft.    Final Clinical Impression(s) / ED Diagnoses Final diagnoses:  Abdominal colic   The patient appears reasonably screened and/or stabilized for discharge and I doubt any other medical condition or other [facility] requiring further screening, evaluation, or treatment in the ED at this time. I have discussed the findings, Dx and Tx plan with the patient/family who expressed understanding and agree(s) with the plan. Discharge instructions discussed at length. The patient/family was given strict return precautions who verbalized understanding of the instructions. No further questions at time of discharge.  Disposition: Discharge  Condition: Good  ED Discharge Orders          Ordered    dicyclomine (BENTYL) 20 MG tablet  3 times daily PRN        11/10/23 0440             Follow Up: Pearline Cables, MD [address] [place] [address] [phone]  Call  to schedule an appointment for close follow up    This chart was dictated using voice recognition software.  Despite best efforts to proofread,  errors can occur which can change the documentation meaning.    Nira Conn, MD 11/10/23 [phone]

## 2023-11-11 NOTE — Procedures (Signed)
[facility] Mandy Kim 77 year old female 1946/10/05   HOME SLEEP TEST REPORT ( by Watch PAT)   STUDY DATE:  10-30-2023   ORDERING CLINICIAN:  REFERRING CLINICIAN: Copland, MD    CLINICAL INFORMATION/HISTORY: 09-27-2023; Chief concern according to patient :  "I need to see a new sleep doctor; needing a new machine - machine is at end of lifespan" .  Mandy Kim is a 77 y.o. female patient who is seen upon referral on 09/27/2023 from PCP for a TOC for OSA,  OSA : she had been dx in 1998 , last sleep study 2011, and that's the age of her machine.    I have the pleasure of seeing Mandy Kim on 09/27/23 a right-handed caucasian female with a history of OSA sleep disorder.        Sleep relevant medical history: Mandy Kim states that she has been snoring again now that she could not use her CPAP machine because of lack of supplies, she has a very old Mirage Quatro medium size mask a fullface mask and worn-out headgear, her machine now displays a sigh assemble but says it is ready to quit working.  She has used the machine for couple of days over the last 90 days so she made a valid attempt and that gave me some interesting data.    Her machine is an Dentist ,set at 13 cm water pressure with 3 cm EPR, and her residual AHI is 0.2/h she has very, very high air leakage of course.  The low AHI however is intriguing-she reportedly can't sleep well without CPAP.      The patient has a history of poorly controlled diabetes mellitus, and nonalcoholic fatty liver, she is status post repair of an abdominal aortic aneurysm in 2013, has remained hyperlipidemic, she had cervical cancer and hysterectomy a year 2000, bilateral breast cancer status post double mastectomy and she is hypothyroid.  She has also had a C. difficile infection.  She had an elective coil embolization of a aneurysm intracranially left internal carotid artery by Dr. Conchita Paris.  This was a left-sided posterior  communicating artery aneurysm.  She is also under the care of a nephrologist, Dr. Carolynn Comment.  She has Bell's palsy in January of this year.  She has chronic venous stasis in both legs with edema, she is taking variable amounts of insulin, she has earlier this year stopped taking metformin,   In February of this year hemoglobin A1c was 8.4, she has a history of hiatal hernia and gastroesophageal reflux disease, elevated LFTs, essential hypertension, congestive heart failure, chronic kidney disease.  She also had been admitted for sepsis in June 2018.      Epworth sleepiness score:  19/ 24 points , FSS endorsed at 36/ 63 points. No GDS data were given.    BMI: 30 kg/m   Neck Circumference: 16.75"   FINDINGS:   Sleep Summary:   Total Recording Time (hours, min): 7 hours 46 minutes      Total Sleep Time (hours, min): 6 hours 44 minutes               Percent REM (%):   12.7%    This patient experienced a very short REM sleep latency of less than 30 minutes, at 25 minutes.  Sleep latency overall was 17 minutes.  There were 45 minutes of wakefulness after sleep onset time.  Respiratory Indices by CMS scoring criteria:   Calculated pAHI (per hour):     3.8/h       (by AASM criteria her AHI would be 9.8/h)                 REM pAHI:    8.3/h                   (by AASM and criteria her AHI would be 21.3/h)                          NREM pAHI:    3.1/h                          Supine AHI:     The patient slept 187 minutes on her back with an AHI of 5.5/h following CMS criteria, or 11.6/h following AASM criteria.  The patient slept on her right side with an AHI of 4.4 by CMS criteria on her left side with an AHI of 1.5/h by CMS criteria and prolonged for 23 minutes with 0 AHI.                                            Oxygen Saturation Statistics:   Oxygen Saturation (%) Mean:   91%, which is considered low.             O2 Saturation Range (%):     Between 77% at nadir and 95% saturation                                   O2 Saturation (minutes) <90%:        40.7 minutes or more than 10% of total sleep time in hypoxia   Pulse Rate Statistics:   Pulse Mean (bpm):   67              Pulse Range:      Between 54 and 90 bpm.  Please note that no cardiac rhythm data can be provided through this home sleep test device.           IMPRESSION:  This HST confirms the presence of mild sleep apnea only when AASM criteria are applied, following CMS criteria this patient does no longer have enough sleep apnea to justify CPAP use.  On the other hand she has significant hypoxia or hypoxemia and would probably need oxygen supplementation if she would not use CPAP.  There is an exemption for CPAP dependent patient's that can no longer get restful or restorative sleep without their CPAP when their apnea is characterized as sleep apnea , unspecified, with increased respiratory effort.  I will try to obtain a new CPAP for the patient based on this diagnosis.  If I do not succeed, I would need to refer her to a pulmonologist to be treated for hypoxemia in sleep.   RECOMMENDATION:  There is an exemption for CPAP dependent patient's that can no longer get restful or restorative sleep without their CPAP when their apnea is characterized as sleep apnea , unspecified, with increased respiratory effort.  I will try to obtain a new CPAP for the patient based on this diagnosis.  If I do not succeed, I would need to  refer her to a pulmonologist to be treated for hypoxemia in sleep.    INTERPRETING PHYSICIAN:   Melvyn Novas, MD  [facility] Sleep Board certified by The [employer] of Sleep Medicine and Diplomate of the [employer] of [employer]. Board certified In Neurology through the ABPN, Fellow of the [employer] of [employer].

## 2023-11-11 NOTE — Addendum Note (Signed)
Addended by: Melvyn Novas on: 11/11/2023 04:39 PM   Modules accepted: Orders

## 2023-11-12 ENCOUNTER — Telehealth: Payer: Self-pay | Admitting: *Deleted

## 2023-11-12 NOTE — Transitions of Care (Post Inpatient/ED Visit) (Signed)
   11/12/2023  Name: HAWI BUTLER MRN: [id number] DOB: [birth year]  Today's TOC FU Call Status: Today's TOC FU Call Status:: Successful TOC FU Call Completed TOC FU Call Complete Date: 11/12/23 Patient's Name and Date of Birth confirmed.  Transition Care Management Follow-up Telephone Call Date of Discharge: 11/10/23 Discharge Facility: [facility]) Type of Discharge: Emergency Department How have you been since you were released from the hospital?: Better Any questions or concerns?: No  Items Reviewed: Did you receive and understand the discharge instructions provided?: Yes Medications obtained,verified, and reconciled?: Yes (Medications Reviewed) Any new allergies since your discharge?: No Dietary orders reviewed?: NA Do you have support at home?: Yes People in Home: spouse Name of Support/Comfort Primary Source: Trellis Moment  Medications Reviewed Today: Medications Reviewed Today   Medications were not reviewed in this encounter     Home Care and Equipment/Supplies: Were Home Health Services Ordered?: NA Any new equipment or medical supplies ordered?: NA  Functional Questionnaire: Do you need assistance with bathing/showering or dressing?: No Do you need assistance with meal preparation?: No Do you need assistance with eating?: No Do you have difficulty maintaining continence: No Do you need assistance with getting out of bed/getting out of a chair/moving?: No Do you have difficulty managing or taking your medications?: No  Follow up appointments reviewed: PCP Follow-up appointment confirmed?: No (pt declined follow up appointment) MD Provider Line Number:[phone] Given: Yes Specialist Hospital Follow-up appointment confirmed?: NA Do you need transportation to your follow-up appointment?: No Do you understand care options if your condition(s) worsen?: Yes-patient verbalized understanding    Donne Anon, CMA

## 2023-11-13 ENCOUNTER — Telehealth: Payer: Self-pay

## 2023-11-13 NOTE — Telephone Encounter (Signed)
-----   Message from [place] Dohmeier sent at 11/11/2023  4:39 PM EST ----- By CMS criteria, this patient no longer qualifies for the dx of OSA and therefore for CPAP therapy.   Some insurances will make an exception for patient with unspecified apnea / hypoxia.  I will try to obtain a new CPAP for the patient based on this diagnosis.  If I do not succeed, I would need to refer her to a pulmonologist to be treated for hypoxemia in sleep.

## 2023-11-13 NOTE — Telephone Encounter (Addendum)
Jacqualine Code D, CMA  Zott, Stacy; Gamble, Tammy New orders have been placed for the above pt, DOB: 08-20-2046 Thanks  Zott, Donette Larry, Abbe Amsterdam, CMA; Melvern Sample Thank You for the referral unfortunately we are not in network with the patients [facility] insurance, the order will need sent to an in network provider

## 2023-11-13 NOTE — Telephone Encounter (Signed)
I called pt. I advised pt that Dr. Vickey Huger reviewed their sleep study results and found that pt mild OSA. Dr. Vickey Huger recommends that pt can try CPAP. I reviewed PAP compliance expectations with the pt. Pt is agreeable to starting a CPAP. I advised pt that an order will be sent to a DME, [facility], and [facility] will call the pt within about one week after they file with the pt's insurance. [facility] will show the pt how to use the machine, fit for masks, and troubleshoot the CPAP if needed. A follow up appt was made for insurance purposes with [facility] 02/11/24 1245. Pt verbalized understanding to arrive 15 minutes early and bring their CPAP. Pt verbalized understanding of results. Pt had no questions at this time but was encouraged to call back if questions arise. I have sent the order to [facility] and have received confirmation that they have received the order.

## 2023-11-26 NOTE — Telephone Encounter (Addendum)
Mandy Kim, CMA  Mandy Kim Mandy Kim, Mandy Kim, Mandy Kim, Mandy Kim, Mandy Kim Mandy Kim orders have been placed for the above pt, DOB: March 18, 2046 Thanks  Mandy Kim, Mandy Kim, Mandy Kim, CMA; Mandy Kim; 1 other received, thank you!

## 2023-11-28 ENCOUNTER — Other Ambulatory Visit: Payer: Self-pay | Admitting: Family Medicine

## 2023-11-28 DIAGNOSIS — Z9889 Other specified postprocedural states: Secondary | ICD-10-CM

## 2023-12-10 DIAGNOSIS — I129 Hypertensive chronic kidney disease with stage 1 through stage 4 chronic kidney disease, or unspecified chronic kidney disease: Secondary | ICD-10-CM | POA: Diagnosis not present

## 2023-12-10 DIAGNOSIS — N1832 Chronic kidney disease, stage 3b: Secondary | ICD-10-CM | POA: Diagnosis not present

## 2023-12-10 DIAGNOSIS — E1122 Type 2 diabetes mellitus with diabetic chronic kidney disease: Secondary | ICD-10-CM | POA: Diagnosis not present

## 2023-12-17 ENCOUNTER — Other Ambulatory Visit: Payer: Self-pay | Admitting: Family Medicine

## 2023-12-17 DIAGNOSIS — E118 Type 2 diabetes mellitus with unspecified complications: Secondary | ICD-10-CM

## 2023-12-24 NOTE — Progress Notes (Addendum)
[facility] [address] [place], [place] [address] [phone]  Date:  12/26/2023   Name:  Mandy Kim   DOB:  [birth year]   MRN:  [id number]  PCP:  Pearline Cables, MD    Chief Complaint: 6 month follow up (Concerns/ questions: cpap device /)   History of Present Illness:  Mandy Kim is a 78 y.o. very pleasant female patient who presents with the following:  Patient seen today for periodic follow-up Most recent visit with myself was in July  History of poorly controlled diabetes, fatty liver, AAA status post repair 2013, hyperlipidemia, cervical cancer status post hysterectomy year 2000, bilateral breast cancer status post double mastectomy ,hypothyroidism  Recent history of C. Difficile She had an elective coil embolization of a LICA aneurysm October 2023-she followed up with neurosurgery in June 2024.  Stable, plan for MRI of the brain in 2 years Nephrology, Dr. Kathrene Bongo  Her most recent A1c was 7.5%.  In July her creatinine was over 2, she had already stopped metformin-followed up with nephrology in September  She was seen in the ER on November 09, 2023 with abdominal pain- she notes the pain is now resolved  CT scan showed cholelithiasis without complications, no diverticulitis.  Aortic stent graft stable.  She was released to home. Creatinine at that time had come down to 1.4, it was 2.17 in June  She has tried a couple of GLP-1 drugs and had difficulty tolerating them so far  Lab Results  Component Value Date   HGBA1C 7.5 (H) 06/25/2023   Due for foot exam-update today Patient declines flu shot, COVID-vaccine, shingles  She gives me a phone number for a sleep apnea  [facility] [employer]  [phone] -I called them and went over the patient's request.  They asked me to please send him a copy of the sleep study and they will see if she qualifies for machine  I reviewed Dr  Dohmeier's notes regarding her sleep study done in November IMPRESSION:  This HST confirms the presence of mild sleep apnea only when AASM criteria are applied, following CMS criteria this patient does no longer have enough sleep apnea to justify CPAP use.  On the other hand she has significant hypoxia or hypoxemia and would probably need oxygen supplementation if she would not use CPAP. RECOMMENDATION: There is an exemption for CPAP dependent patient's that can no longer get restful or restorative sleep without their CPAP when their apnea is characterized as sleep apnea , unspecified, with increased respiratory effort.   I will try to obtain a new CPAP for the patient based on this diagnosis.  If I do not succeed, I would need to refer her to a pulmonologist to be treated for hypoxemia in sleep. ------------------------------------- Dispense CPAP with humidifier and supplies  Setting 4/20 cm H2O [phone] fax     Patient Active Problem List   Diagnosis Date Noted   History of sleep apnea 09/27/2023   Edentulous 09/27/2023   Cerebral aneurysm 10/03/2022   BCC (basal cell carcinoma), face 05/18/2022   Carpal tunnel syndrome, bilateral 05/18/2022   Colon polyp 05/18/2022   Herniated intervertebral disk 05/18/2022   Hypokalemia 05/18/2022   Intermittent claudication (HCC) 05/18/2022   Intracranial aneurysm 05/18/2022   Wears glasses 05/16/2022   Stress incontinence due to pelvic organ prolapse 05/16/2022   PONV (postoperative nausea and vomiting) 05/16/2022   OSA  on CPAP 05/16/2022   History of diverticulitis of colon 05/16/2022   History of bilateral breast cancer 05/16/2022   History of basal cell carcinoma excision 05/16/2022   Hiatal hernia 05/16/2022   Diverticulitis 05/16/2022   CKD (chronic kidney disease) 05/16/2022   Chronic cystitis 05/16/2022   Cancer (HCC) 05/16/2022   Anticoagulant long-term use 05/16/2022   Hypoxia 05/05/2022   Acute diarrhea 05/04/2022   Mixed  hyperlipidemia due to type 2 diabetes mellitus (HCC) 05/04/2022   Essential hypertension 05/04/2022   GERD without esophagitis 05/04/2022   AKI (acute kidney injury) (HCC) 05/04/2022   Hypothyroidism 05/04/2022   Dark stools 05/04/2022   Myositis, unspecified 04/27/2020   Prolapse of female pelvic organs 05/24/2019   Cystocele with prolapse 05/23/2019   Acute respiratory failure with hypoxia (HCC)    Sepsis (HCC) 05/28/2017   Elevated LFTs 05/28/2017   Acute lower UTI 05/28/2017   Dehydration 05/28/2017   Peripheral neuropathy 07/27/2014   Rectocele 04/02/2013   Urge incontinence 04/02/2013   Urinary tract infection, site not specified 04/02/2013   Urinary urgency 04/02/2013   AAA (abdominal aortic aneurysm) (HCC) 02/12/2012   Fatty liver 02/12/2012   Breast cancer (HCC) 02/12/2012   Cervical cancer (HCC) 02/12/2012   Type 2 diabetes mellitus with diabetic polyneuropathy, with long-term current use of insulin (HCC) 02/12/2012   Hyperlipidemia 02/12/2012   S/P AAA (abdominal aortic aneurysm) repair 09/15/2011   Impaired glucose tolerance 06/26/2011   History of cervical cancer 2000   CHF (congestive heart failure) (HCC) 2000    Past Medical History:  Diagnosis Date   AAA (abdominal aortic aneurysm) (HCC) 02/12/2012   She is a pt of vascular surgery in [place]- she had a repair done in approx 2013.    Annual [place], stent has been in good position   Acute diarrhea 05/04/2022   Acute lower UTI 05/28/2017   Acute respiratory failure with hypoxia (HCC)    AKI (acute kidney injury) (HCC) 05/04/2022   Anticoagulant long-term use    plavix   Arthritis    Breast cancer (HCC) 02/12/2012   Double mastectomy, 2005.  Now cured   Cancer (HCC)    CHF (congestive heart failure) (HCC) 2000   Chronic cystitis    Chronic kidney disease    Cystocele with prolapse 05/23/2019   Dark stools 05/04/2022   Dehydration 05/28/2017   Diverticulitis    Elevated LFTs 05/28/2017   Essential  hypertension 05/04/2022   Fatty liver 02/12/2012   GERD (gastroesophageal reflux disease)    GERD without esophagitis 05/04/2022   Hiatal hernia    History of basal cell carcinoma excision    face/ nose right side   History of bilateral breast cancer followed by oncologist until 2008,  per pt no recurrence   right 2002  and left 2003 s/p  mastectomy with completed chemo and tamexifen therapy    History of cervical cancer 2000   s/p  TAH w/ BSO   History of diverticulitis of colon    Hyperlipidemia    Hypothyroidism    Hypoxia 05/05/2022   Mixed hyperlipidemia due to type 2 diabetes mellitus (HCC) 05/04/2022   OSA on CPAP    PAD (peripheral artery disease) (HCC)    Peripheral neuropathy    PONV (postoperative nausea and vomiting)    Prolapse of female pelvic organs 05/24/2019   S/P AAA (abdominal aortic aneurysm) repair 09/15/2011   Sepsis (HCC) 05/28/2017   Stress incontinence due to pelvic organ prolapse    Type 2  diabetes mellitus treated with insulin (HCC)    followed by pcp   Type 2 diabetes mellitus with diabetic polyneuropathy, with long-term current use of insulin (HCC) 02/12/2012   Wears glasses     Past Surgical History:  Procedure Laterality Date   ABDOMINAL HYSTERECTOMY  2000   w/  BSO,  and Bladder tacking abdominally   CARPAL TUNNEL RELEASE Left 1999   CATARACT EXTRACTION W/ INTRAOCULAR LENS  IMPLANT, BILATERAL  2015   ENDOVASCULAR REPAIR/STENT GRAFT  09-15-2011    @[facility]    INCONTINENCE SURGERY  2007  approx.   IR ANGIO INTRA EXTRACRAN SEL COM CAROTID INNOMINATE UNI L MOD SED  05/15/2023   IR ANGIO INTRA EXTRACRAN SEL INTERNAL CAROTID UNI L MOD SED  04/11/2022   IR ANGIO INTRA EXTRACRAN SEL INTERNAL CAROTID UNI L MOD SED  10/03/2022   IR ANGIO VERTEBRAL SEL VERTEBRAL UNI R MOD SED  04/11/2022   IR ANGIOGRAM FOLLOW UP STUDY  10/03/2022   IR ANGIOGRAM FOLLOW UP STUDY  10/03/2022   IR ANGIOGRAM FOLLOW UP STUDY  10/03/2022   IR NEURO EACH ADD'L AFTER BASIC UNI LEFT  (MS)  10/03/2022   IR TRANSCATH/EMBOLIZ  10/03/2022   IR US GUIDE VASC ACCESS RIGHT  04/11/2022   IR US GUIDE VASC ACCESS RIGHT  05/15/2023   MASTECTOMY Bilateral right 2002;  left 2003   lymph node dissection done only on right side   RADIOLOGY WITH ANESTHESIA N/A 10/03/2022   Procedure: 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;  Surgeon: Lisbeth Renshaw, MD;  Location: [facility] OR;  Service: Radiology;  Laterality: N/A;   ROBOTIC ASSISTED LAPAROSCOPIC SACROCOLPOPEXY N/A 05/23/2019   Procedure: XI ROBOTIC ASSISTED LAPAROSCOPIC SACROCOLPOPEXY;  Surgeon: Crist Fat, MD;  Location: [facility] ORS;  Service: Urology;  Laterality: N/A;   TONSILLECTOMY  child   TYMPANOPLASTY Right 1970s   "had my ear drum replacement with a plastic one"   WRIST GANGLION EXCISION Left 1998    Social History   Tobacco Use   Smoking status: Former    Current packs/day: 0.00    Types: Cigarettes    Start date: 06/06/1969    Quit date: 06/07/1999    Years since quitting: 24.5   Smokeless tobacco: Never  Vaping Use   Vaping status: Never Used  Substance Use Topics   Alcohol use: Never   Drug use: Never    Family History  Problem Relation Age of Onset   Cancer Mother    Pneumonia Mother    Thyroid disease Mother    Thyroid disease Sister    Obesity Daughter    Hypertension Daughter    Thyroid disease Son    Pneumonia Maternal Grandmother    Heart attack Maternal Grandfather    Diabetes Neg Hx     Allergies  Allergen Reactions   Aspirin     Per pt any pain medications - causes fluid build up    Bee Venom Swelling   Ibuprofen Swelling   Ozempic (0.25 Or 0.5 Mg-Dose) [Semaglutide(0.25 Or 0.5mg -Dos)] Diarrhea and Nausea And Vomiting    Flatulence   Poison Oak Extract Itching and Swelling   Statins     "skin feels like bugs under the skin"   Trulicity [Dulaglutide] Nausea And Vomiting and Other (See Comments)    Flatulance, "deathly sick"   Tylenol [Acetaminophen] Swelling    Victoza [Liraglutide] Other (See Comments)    Stomach upset issues     Medication list has been reviewed and updated.  Current  Outpatient Medications on File Prior to Visit  Medication Sig Dispense Refill   Accu-Chek Softclix Lancets lancets TEST BLOOD SUGAR THREE TIMES DAILY 300 each 1   Alcohol Swabs (DROPSAFE ALCOHOL PREP) 70 % PADS USE TO TEST BLOOD SUGAR 3 TIMES DAILY. 300 each 2   Alcohol Swabs PADS Use to test blood sugar 3 times daily. Dx: E11.9 300 each 2   Blood Glucose Calibration (GLUCOSE CONTROL) SOLN Use to test blood sugar 3 times daily. Dx: E11.9 1 each 2   Blood Glucose Monitoring Suppl (ACCU-CHEK AVIVA PLUS) w/Device KIT USE AS DIRECTED  TO TEST BLOOD GLUCOSE BID E11.9 1 kit 0   Carboxymethylcellul-Glycerin (LUBRICATING EYE DROPS OP) Place 1 drop into both eyes 3 (three) times daily.     cephALEXin (KEFLEX) 250 MG capsule Take 250 mg by mouth at bedtime.     clopidogrel (PLAVIX) 75 MG tablet TAKE 1 TABLET EVERY DAY 90 tablet 0   famotidine (PEPCID) 20 MG tablet Take 1 tablet (20 mg total) by mouth daily. 90 tablet 1   gabapentin (NEURONTIN) 100 MG capsule Take 1 capsule (100 mg total) by mouth 2 (two) times daily. 180 capsule 1   gemfibrozil (LOPID) 600 MG tablet TAKE 1 TABLET TWICE DAILY BEFORE MEALS 180 tablet 3   Ginger, Zingiber officinalis, (GINGER ROOT PO) Take 1 tablet by mouth at bedtime.     glipiZIDE (GLUCOTROL) 5 MG tablet TAKE 1 TABLET TWICE DAILY BEFORE MEALS 180 tablet 3   glucose blood (ACCU-CHEK AVIVA PLUS) test strip TEST BLOOD SUGAR THREE TIMES DAILY 300 strip 12   insulin degludec (TRESIBA FLEXTOUCH) 100 UNIT/ML FlexTouch Pen Inject 60 Units into the skin at bedtime. 60 mL 0   Insulin Pen Needle 32G X 4 MM MISC Used to inject insulin 1x daily. 100 each 4   Insulin Syringe-Needle U-100 (INSULIN SYRINGE 1CC/30GX5/16") 30G X 5/16" 1 ML MISC Use to inject insulin 1 time per day. 100 each 2   levothyroxine (SYNTHROID) 50 MCG tablet TAKE 1 TABLET EVERY DAY  BEFORE BREAKFAST 90 tablet 3   losartan (COZAAR) 50 MG tablet Take 1 tablet (50 mg total) by mouth daily. 90 tablet 1   Menthol, Topical Analgesic, (BIOFREEZE ROLL-ON EX) Apply 1 application topically as needed (joint pain).     niacin (NIASPAN) 500 MG CR tablet Take 1 tablet (500 mg total) by mouth at bedtime. 30 tablet 3   Omega-3 Fatty Acids (FISH OIL) 1000 MG CAPS Take 1,000 mg by mouth 2 (two) times a day.      ondansetron (ZOFRAN) 4 MG tablet Take 1 tablet (4 mg total) by mouth every 8 (eight) hours as needed for nausea or vomiting. 12 tablet 0   predniSONE (DELTASONE) 10 MG tablet Take 20 mg daily for 3 days, then 10 mg daily for 3 days 9 tablet 0   Probiotic Product (PROBIOTIC DAILY PO) Take 1 capsule by mouth daily.     valACYclovir (VALTREX) 1000 MG tablet Take 1 tablet (1,000 mg total) by mouth 3 (three) times daily. 21 tablet 0   zinc gluconate 50 MG tablet Take 50 mg by mouth at bedtime.     dicyclomine (BENTYL) 20 MG tablet Take 1 tablet (20 mg total) by mouth 3 (three) times daily as needed for up to 5 days (abdominal cramping). 15 tablet 0   No current facility-administered medications on file prior to visit.    Review of Systems:  As per HPI- otherwise negative.   Physical Examination: Vitals:  12/26/23 1356  BP: 124/80  Pulse: 76  Resp: 18  Temp: 98.3 F (36.8 C)  SpO2: 93%   Vitals:   12/26/23 1356  Weight: 180 lb 3.2 oz (81.7 kg)  Height: 5\' 4"  (1.626 m)   Body mass index is 30.93 kg/m. Ideal Body Weight: Weight in (lb) to have BMI = 25: 145.3  GEN: no acute distress.  Central obesity, looks well  HEENT: Atraumatic, Normocephalic.  Ears and Nose: No external deformity. CV: RRR, No M/G/R. No JVD. No thrill. No extra heart sounds. PULM: CTA B, no wheezes, crackles, rhonchi. No retractions. No resp. distress. No accessory muscle use. EXTR: No c/c/e PSYCH: Normally interactive. Conversant.  Foot exam completed today   Wt Readings from Last 3  Encounters:  12/26/23 180 lb 3.2 oz (81.7 kg)  09/27/23 178 lb 12.8 oz (81.1 kg)  06/25/23 175 lb 3.2 oz (79.5 kg)     Assessment and Plan: Type 2 diabetes mellitus with diabetic polyneuropathy, with long-term current use of insulin (HCC) - Plan: Hemoglobin A1c, Basic metabolic panel, dapagliflozin propanediol (FARXIGA) 10 MG TABS tablet  Essential hypertension  Acquired hypothyroidism - Plan: TSH  Fatty liver  Chronic kidney disease, unspecified CKD stage  Excessive sleepiness  Patient seen today for follow-up, I will be in touch with her lab results and can adjust diabetes treatment as needed Follow-up on TSH today-currently on 50 mcg levothyroxine She is seen regularly by nephrology Patient would like me to attempt getting a CPAP machine approved for her.  I will send an order and copy of her sleep study to the company mentioned under HPI Signed Abbe Amsterdam, MD  Addendum 1/27, received labs as below.  Patient does not have MyChart, I gave her a call but no answer at this time  Called again 1/29- LMOM that I was trying to reach her about labs.  We do need to adjust her thyroid medication.  I will send her a letter at this time, asked her to please call us back  Results for orders placed or performed in visit on 12/26/23  Hemoglobin A1c   Collection Time: 12/26/23  2:26 PM  Result Value Ref Range   Hgb A1c MFr Bld 8.8 (H) 4.6 - 6.5 %  Basic metabolic panel   Collection Time: 12/26/23  2:26 PM  Result Value Ref Range   Sodium 138 135 - 145 mEq/L   Potassium 4.2 3.5 - 5.1 mEq/L   Chloride 102 96 - 112 mEq/L   CO2 24 19 - 32 mEq/L   Glucose, Bld 117 (H) 70 - 99 mg/dL   BUN 29 (H) 6 - 23 mg/dL   Creatinine, Ser 1.61 (H) 0.40 - 1.20 mg/dL   GFR 09.60 (L) >45.40 mL/min   Calcium 10.4 8.4 - 10.5 mg/dL  TSH   Collection Time: 12/26/23  2:26 PM  Result Value Ref Range   TSH 5.67 (H) 0.35 - 5.50 uIU/mL   Current dosage of levothyroxine 50 mcg Renal function stable A1c  7.5- 8.8%  Pt of [place] [facility]

## 2023-12-26 ENCOUNTER — Ambulatory Visit (INDEPENDENT_AMBULATORY_CARE_PROVIDER_SITE_OTHER): Payer: Medicare HMO | Admitting: Family Medicine

## 2023-12-26 VITALS — BP 124/80 | HR 76 | Temp 98.3°F | Resp 18 | Ht 64.0 in | Wt 180.2 lb

## 2023-12-26 DIAGNOSIS — G4710 Hypersomnia, unspecified: Secondary | ICD-10-CM | POA: Diagnosis not present

## 2023-12-26 DIAGNOSIS — Z794 Long term (current) use of insulin: Secondary | ICD-10-CM | POA: Diagnosis not present

## 2023-12-26 DIAGNOSIS — E1142 Type 2 diabetes mellitus with diabetic polyneuropathy: Secondary | ICD-10-CM | POA: Diagnosis not present

## 2023-12-26 DIAGNOSIS — N189 Chronic kidney disease, unspecified: Secondary | ICD-10-CM | POA: Diagnosis not present

## 2023-12-26 DIAGNOSIS — K760 Fatty (change of) liver, not elsewhere classified: Secondary | ICD-10-CM

## 2023-12-26 DIAGNOSIS — I10 Essential (primary) hypertension: Secondary | ICD-10-CM

## 2023-12-26 DIAGNOSIS — E039 Hypothyroidism, unspecified: Secondary | ICD-10-CM | POA: Diagnosis not present

## 2023-12-26 MED ORDER — DAPAGLIFLOZIN PROPANEDIOL 10 MG PO TABS
10.0000 mg | ORAL_TABLET | Freq: Every day | ORAL | 3 refills | Status: AC
Start: 2023-12-26 — End: ?

## 2023-12-26 NOTE — Patient Instructions (Signed)
I will be in touch with your labs asap and we can plan any next steps.  I will try to figure out your sleep apnea machine issues for you

## 2023-12-27 LAB — BASIC METABOLIC PANEL
BUN: 29 mg/dL — ABNORMAL HIGH (ref 6–23)
CO2: 24 meq/L (ref 19–32)
Calcium: 10.4 mg/dL (ref 8.4–10.5)
Chloride: 102 meq/L (ref 96–112)
Creatinine, Ser: 1.46 mg/dL — ABNORMAL HIGH (ref 0.40–1.20)
GFR: 34.1 mL/min — ABNORMAL LOW (ref >60.00)
Glucose, Bld: 117 mg/dL — ABNORMAL HIGH (ref 70–99)
Potassium: 4.2 meq/L (ref 3.5–5.1)
Sodium: 138 meq/L (ref 135–145)

## 2023-12-27 LAB — TSH: TSH: 5.67 u[IU]/mL — ABNORMAL HIGH (ref 0.35–5.50)

## 2023-12-27 LAB — HEMOGLOBIN A1C: Hgb A1c MFr Bld: 8.8 % — ABNORMAL HIGH (ref 4.6–6.5)

## 2024-01-03 ENCOUNTER — Telehealth: Payer: Self-pay

## 2024-01-03 DIAGNOSIS — E039 Hypothyroidism, unspecified: Secondary | ICD-10-CM

## 2024-01-03 MED ORDER — LEVOTHYROXINE SODIUM 75 MCG PO TABS
75.0000 ug | ORAL_TABLET | Freq: Every day | ORAL | 2 refills | Status: DC
Start: 2024-01-03 — End: 2024-08-11

## 2024-01-03 NOTE — Telephone Encounter (Signed)
Called pt - she plans to cut down on her carbs and declines to change her diabetes meds right now We will increase her dose of levothyroxine- taking 50 mcg currently, will go up to 75 She has been taking it on a regular basis Plan to do a TSH level in about 6 weeks   Results for orders placed or performed in visit on 12/26/23  Hemoglobin A1c   Collection Time: 12/26/23  2:26 PM  Result Value Ref Range   Hgb A1c MFr Bld 8.8 (H) 4.6 - 6.5 %  Basic metabolic panel   Collection Time: 12/26/23  2:26 PM  Result Value Ref Range   Sodium 138 135 - 145 mEq/L   Potassium 4.2 3.5 - 5.1 mEq/L   Chloride 102 96 - 112 mEq/L   CO2 24 19 - 32 mEq/L   Glucose, Bld 117 (H) 70 - 99 mg/dL   BUN 29 (H) 6 - 23 mg/dL   Creatinine, Ser 1.61 (H) 0.40 - 1.20 mg/dL   GFR 09.60 (L) >45.40 mL/min   Calcium 10.4 8.4 - 10.5 mg/dL  TSH   Collection Time: 12/26/23  2:26 PM  Result Value Ref Range   TSH 5.67 (H) 0.35 - 5.50 uIU/mL

## 2024-01-03 NOTE — Telephone Encounter (Signed)
Copied from CRM [id number]. Topic: Clinical - Lab/Test Results >> Jan 03, 2024 10:44 AM Adelina Mings wrote: Reason for CRM: Requesting A1c results

## 2024-01-03 NOTE — Telephone Encounter (Signed)
Copied from CRM [id number]. Topic: General - Call Back - No Documentation >> Jan 03, 2024 11:32 AM Almira Coaster wrote: Reason for CRM: Patient states she received a call from Dr.Copland; however, no documentation. Patient's best call back number is [phone].

## 2024-01-03 NOTE — Addendum Note (Signed)
Addended by: Abbe Amsterdam C on: 01/03/2024 12:51 PM   Modules accepted: Orders

## 2024-01-07 NOTE — Telephone Encounter (Signed)
 Pt aware of results

## 2024-01-15 DIAGNOSIS — C44311 Basal cell carcinoma of skin of nose: Secondary | ICD-10-CM | POA: Diagnosis not present

## 2024-01-18 ENCOUNTER — Other Ambulatory Visit (INDEPENDENT_AMBULATORY_CARE_PROVIDER_SITE_OTHER): Payer: Medicare HMO

## 2024-01-18 DIAGNOSIS — E039 Hypothyroidism, unspecified: Secondary | ICD-10-CM | POA: Diagnosis not present

## 2024-01-18 NOTE — Addendum Note (Signed)
Addended by: Thelma Barge D on: 01/18/2024 12:42 PM   Modules accepted: Orders

## 2024-01-19 LAB — TSH: TSH: 2.62 m[IU]/L (ref 0.40–4.50)

## 2024-01-24 ENCOUNTER — Other Ambulatory Visit: Payer: Self-pay | Admitting: Family Medicine

## 2024-01-24 DIAGNOSIS — E785 Hyperlipidemia, unspecified: Secondary | ICD-10-CM

## 2024-02-04 ENCOUNTER — Telehealth: Payer: Self-pay | Admitting: Neurology

## 2024-02-04 NOTE — Telephone Encounter (Signed)
 Pt said Dr. Patsy Lager can not write the prescription for new CPAP.machine. Would like a call back.  Patient is very confused on what to do to get her new CPAP.

## 2024-02-04 NOTE — Telephone Encounter (Signed)
 Contacted pt back and went over message received from DME. Advised Dr Dohmeier noted in SSR that we will try to obtain a new CPAP for the patient based on this diagnosis. If I do not succeed, I would need to refer her to a pulmonologist to be treated for hypoxemia. Pt declined referral to pulmonologist nor can she afford to pay for machine out of pocket.  Advised pt to call the office back with any concerns we can help with or if she would like to move forward with referral. Pt verbally understood.

## 2024-02-11 ENCOUNTER — Other Ambulatory Visit: Payer: Self-pay | Admitting: Family Medicine

## 2024-02-11 ENCOUNTER — Encounter: Payer: Medicare HMO | Admitting: Adult Health

## 2024-02-11 DIAGNOSIS — Z9889 Other specified postprocedural states: Secondary | ICD-10-CM

## 2024-03-10 ENCOUNTER — Other Ambulatory Visit: Payer: Self-pay | Admitting: Family Medicine

## 2024-03-10 DIAGNOSIS — I10 Essential (primary) hypertension: Secondary | ICD-10-CM

## 2024-03-10 DIAGNOSIS — E118 Type 2 diabetes mellitus with unspecified complications: Secondary | ICD-10-CM

## 2024-03-23 ENCOUNTER — Other Ambulatory Visit: Payer: Self-pay | Admitting: Family Medicine

## 2024-03-23 DIAGNOSIS — E1142 Type 2 diabetes mellitus with diabetic polyneuropathy: Secondary | ICD-10-CM

## 2024-04-07 ENCOUNTER — Other Ambulatory Visit: Payer: Self-pay | Admitting: Family Medicine

## 2024-05-20 DIAGNOSIS — Z85828 Personal history of other malignant neoplasm of skin: Secondary | ICD-10-CM | POA: Diagnosis not present

## 2024-05-20 DIAGNOSIS — D692 Other nonthrombocytopenic purpura: Secondary | ICD-10-CM | POA: Diagnosis not present

## 2024-05-20 DIAGNOSIS — Z08 Encounter for follow-up examination after completed treatment for malignant neoplasm: Secondary | ICD-10-CM | POA: Diagnosis not present

## 2024-05-20 DIAGNOSIS — D1801 Hemangioma of skin and subcutaneous tissue: Secondary | ICD-10-CM | POA: Diagnosis not present

## 2024-05-20 DIAGNOSIS — L821 Other seborrheic keratosis: Secondary | ICD-10-CM | POA: Diagnosis not present

## 2024-05-20 DIAGNOSIS — L814 Other melanin hyperpigmentation: Secondary | ICD-10-CM | POA: Diagnosis not present

## 2024-05-20 DIAGNOSIS — L570 Actinic keratosis: Secondary | ICD-10-CM | POA: Diagnosis not present

## 2024-06-05 DIAGNOSIS — N1832 Chronic kidney disease, stage 3b: Secondary | ICD-10-CM | POA: Diagnosis not present

## 2024-06-05 DIAGNOSIS — E1122 Type 2 diabetes mellitus with diabetic chronic kidney disease: Secondary | ICD-10-CM | POA: Diagnosis not present

## 2024-06-05 DIAGNOSIS — I129 Hypertensive chronic kidney disease with stage 1 through stage 4 chronic kidney disease, or unspecified chronic kidney disease: Secondary | ICD-10-CM | POA: Diagnosis not present

## 2024-06-05 LAB — BASIC METABOLIC PANEL WITH GFR
BUN: 26 — AB (ref 4–21)
CO2: 20 (ref 13–22)
Chloride: 108 (ref 99–108)
Creatinine: 1.4 — AB (ref 0.5–1.1)
Glucose: 131
Potassium: 5 meq/L (ref 3.5–5.1)
Sodium: 142 (ref 137–147)

## 2024-06-05 LAB — CBC AND DIFFERENTIAL
HCT: 36 (ref 36–46)
Hemoglobin: 11.4 — AB (ref 12.0–16.0)
Neutrophils Absolute: 2.4
Platelets: 279 K/uL (ref 150–400)
WBC: 8.3

## 2024-06-05 LAB — COMPREHENSIVE METABOLIC PANEL WITH GFR
Albumin: 4.7 (ref 3.5–5.0)
Calcium: 9.4 (ref 8.7–10.7)
Globulin: 2.5
eGFR: 38

## 2024-06-05 LAB — HEPATIC FUNCTION PANEL
ALT: 39 U/L — AB (ref 7–35)
AST: 42 — AB (ref 13–35)
Alkaline Phosphatase: 55 (ref 25–125)
Bilirubin, Total: 0.3

## 2024-06-05 LAB — PROTEIN / CREATININE RATIO, URINE: Creatinine, Urine: 64.9

## 2024-06-05 LAB — HEMOGLOBIN A1C: Hemoglobin A1C: 6.9

## 2024-06-05 LAB — CBC: RBC: 3.88 (ref 3.87–5.11)

## 2024-06-09 ENCOUNTER — Encounter: Payer: Self-pay | Admitting: Family Medicine

## 2024-06-20 ENCOUNTER — Other Ambulatory Visit: Payer: Self-pay | Admitting: Family Medicine

## 2024-06-20 DIAGNOSIS — E785 Hyperlipidemia, unspecified: Secondary | ICD-10-CM

## 2024-06-21 NOTE — Progress Notes (Unsigned)
 [facility] [address] [place], [place] [address] [phone]  Date:  06/25/2024   Name:  Mandy Kim   DOB:  1946-06-28   MRN:  [id number]  PCP:  Watt Harlene BROCKS, MD    Chief Complaint: No chief complaint on file.   History of Present Illness:  Mandy Kim is a 78 y.o. very pleasant female patient who presents with the following:  Patient seen today for periodic follow-up  Most recent visit with myself was in January History of poorly controlled diabetes, fatty liver, AAA status post repair 2013, hyperlipidemia, cervical cancer status post hysterectomy year 2000, bilateral breast cancer status post double mastectomy ,hypothyroidism  Recent history of C. Difficile She had an elective coil embolization of a LICA aneurysm October 2023-she followed up with neurosurgery in June 2024.  Stable, plan for MRI of the brain in 2 years Nephrology, Dr. Prescilla  Lab Results  Component Value Date   HGBA1C 6.9 06/05/2024     Patient Active Problem List   Diagnosis Date Noted   History of sleep apnea 09/27/2023   Edentulous 09/27/2023   Cerebral aneurysm 10/03/2022   BCC (basal cell carcinoma), face 05/18/2022   Carpal tunnel syndrome, bilateral 05/18/2022   Colon polyp 05/18/2022   Herniated intervertebral disk 05/18/2022   Hypokalemia 05/18/2022   Intermittent claudication (HCC) 05/18/2022   Intracranial aneurysm 05/18/2022   Wears glasses 05/16/2022   Stress incontinence due to pelvic organ prolapse 05/16/2022   PONV (postoperative nausea and vomiting) 05/16/2022   OSA on CPAP 05/16/2022   History of diverticulitis of colon 05/16/2022   History of bilateral breast cancer 05/16/2022   History of basal cell carcinoma excision 05/16/2022   Hiatal hernia 05/16/2022   Diverticulitis 05/16/2022   CKD (chronic kidney disease) 05/16/2022   Chronic cystitis 05/16/2022   Cancer (HCC) 05/16/2022    Anticoagulant long-term use 05/16/2022   Hypoxia 05/05/2022   Acute diarrhea 05/04/2022   Mixed hyperlipidemia due to type 2 diabetes mellitus (HCC) 05/04/2022   Essential hypertension 05/04/2022   GERD without esophagitis 05/04/2022   AKI (acute kidney injury) (HCC) 05/04/2022   Hypothyroidism 05/04/2022   Dark stools 05/04/2022   Myositis, unspecified 04/27/2020   Prolapse of female pelvic organs 05/24/2019   Cystocele with prolapse 05/23/2019   Acute respiratory failure with hypoxia (HCC)    Sepsis (HCC) 05/28/2017   Elevated LFTs 05/28/2017   Acute lower UTI 05/28/2017   Dehydration 05/28/2017   Peripheral neuropathy 07/27/2014   Rectocele 04/02/2013   Urge incontinence 04/02/2013   Urinary tract infection, site not specified 04/02/2013   Urinary urgency 04/02/2013   AAA (abdominal aortic aneurysm) (HCC) 02/12/2012   Fatty liver 02/12/2012   Breast cancer (HCC) 02/12/2012   Cervical cancer (HCC) 02/12/2012   Type 2 diabetes mellitus with diabetic polyneuropathy, with long-term current use of insulin  (HCC) 02/12/2012   Hyperlipidemia 02/12/2012   S/P AAA (abdominal aortic aneurysm) repair 09/15/2011   Impaired glucose tolerance 06/26/2011   History of cervical cancer 2000   CHF (congestive heart failure) (HCC) 2000    Past Medical History:  Diagnosis Date   AAA (abdominal aortic aneurysm) (HCC) 02/12/2012   She is a pt of vascular surgery in [place]- she had a repair done in approx 2013.    Annual US , stent has been in good position   Acute diarrhea 05/04/2022   Acute lower UTI 05/28/2017   Acute respiratory failure with  hypoxia (HCC)    AKI (acute kidney injury) (HCC) 05/04/2022   Anticoagulant long-term use    plavix    Arthritis    Breast cancer (HCC) 02/12/2012   Double mastectomy, 2005.  Now cured   Cancer (HCC)    CHF (congestive heart failure) (HCC) 2000   Chronic cystitis    Chronic kidney disease    Cystocele with prolapse 05/23/2019   Dark stools  05/04/2022   Dehydration 05/28/2017   Diverticulitis    Elevated LFTs 05/28/2017   Essential hypertension 05/04/2022   Fatty liver 02/12/2012   GERD (gastroesophageal reflux disease)    GERD without esophagitis 05/04/2022   Hiatal hernia    History of basal cell carcinoma excision    face/ nose right side   History of bilateral breast cancer followed by oncologist until 2008,  per pt no recurrence   right 2002  and left 2003 s/p  mastectomy with completed chemo and tamexifen therapy    History of cervical cancer 2000   s/p  TAH w/ BSO   History of diverticulitis of colon    Hyperlipidemia    Hypothyroidism    Hypoxia 05/05/2022   Mixed hyperlipidemia due to type 2 diabetes mellitus (HCC) 05/04/2022   OSA on CPAP    PAD (peripheral artery disease) (HCC)    Peripheral neuropathy    PONV (postoperative nausea and vomiting)    Prolapse of female pelvic organs 05/24/2019   S/P AAA (abdominal aortic aneurysm) repair 09/15/2011   Sepsis (HCC) 05/28/2017   Stress incontinence due to pelvic organ prolapse    Type 2 diabetes mellitus treated with insulin  (HCC)    followed by pcp   Type 2 diabetes mellitus with diabetic polyneuropathy, with long-term current use of insulin  (HCC) 02/12/2012   Wears glasses     Past Surgical History:  Procedure Laterality Date   ABDOMINAL HYSTERECTOMY  2000   w/  BSO,  and Bladder tacking abdominally   CARPAL TUNNEL RELEASE Left 1999   CATARACT EXTRACTION W/ INTRAOCULAR LENS  IMPLANT, BILATERAL  2015   ENDOVASCULAR REPAIR/STENT GRAFT  09-15-2011    @[facility]    INCONTINENCE SURGERY  2007  approx.   IR ANGIO INTRA EXTRACRAN SEL COM CAROTID INNOMINATE UNI L MOD SED  05/15/2023   IR ANGIO INTRA EXTRACRAN SEL INTERNAL CAROTID UNI L MOD SED  04/11/2022   IR ANGIO INTRA EXTRACRAN SEL INTERNAL CAROTID UNI L MOD SED  10/03/2022   IR ANGIO VERTEBRAL SEL VERTEBRAL UNI R MOD SED  04/11/2022   IR ANGIOGRAM FOLLOW UP STUDY  10/03/2022   IR ANGIOGRAM FOLLOW UP STUDY   10/03/2022   IR ANGIOGRAM FOLLOW UP STUDY  10/03/2022   IR NEURO EACH ADD'L AFTER BASIC UNI LEFT (MS)  10/03/2022   IR TRANSCATH/EMBOLIZ  10/03/2022   IR US  GUIDE VASC ACCESS RIGHT  04/11/2022   IR US  GUIDE VASC ACCESS RIGHT  05/15/2023   MASTECTOMY Bilateral right 2002;  left 2003   lymph node dissection done only on right side   RADIOLOGY WITH ANESTHESIA N/A 10/03/2022   Procedure: 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;  Surgeon: Lanis Pupa, MD;  Location: [facility] OR;  Service: Radiology;  Laterality: N/A;   ROBOTIC ASSISTED LAPAROSCOPIC SACROCOLPOPEXY N/A 05/23/2019   Procedure: XI ROBOTIC ASSISTED LAPAROSCOPIC SACROCOLPOPEXY;  Surgeon: Cam Morene ORN, MD;  Location: [facility] ORS;  Service: Urology;  Laterality: N/A;   TONSILLECTOMY  child   TYMPANOPLASTY Right 1970s   had my ear drum replacement with  a plastic one   WRIST GANGLION EXCISION Left 1998    Social History   Tobacco Use   Smoking status: Former    Current packs/day: 0.00    Types: Cigarettes    Start date: 06/06/1969    Quit date: 06/07/1999    Years since quitting: 25.0   Smokeless tobacco: Never  Vaping Use   Vaping status: Never Used  Substance Use Topics   Alcohol  use: Never   Drug use: Never    Family History  Problem Relation Age of Onset   Cancer Mother    Pneumonia Mother    Thyroid  disease Mother    Thyroid  disease Sister    Obesity Daughter    Hypertension Daughter    Thyroid  disease Son    Pneumonia Maternal Grandmother    Heart attack Maternal Grandfather    Diabetes Neg Hx     Allergies  Allergen Reactions   Aspirin     Per pt any pain medications - causes fluid build up    Bee Venom Swelling   Ibuprofen Swelling   Ozempic  (0.25 Or 0.5 Mg-Dose) [Semaglutide (0.25 Or 0.5mg -Dos)] Diarrhea and Nausea And Vomiting    Flatulence   Poison Oak Extract Itching and Swelling   Statins     skin feels like bugs under the skin   Trulicity  [Dulaglutide ] Nausea And  Vomiting and Other (See Comments)    Flatulance, deathly sick   Tylenol  [Acetaminophen ] Swelling   Victoza  [Liraglutide ] Other (See Comments)    Stomach upset issues     Medication list has been reviewed and updated.  Current Outpatient Medications on File Prior to Visit  Medication Sig Dispense Refill   Accu-Chek Softclix Lancets lancets TEST BLOOD SUGAR THREE TIMES DAILY 300 each 1   Alcohol  Swabs  (DROPSAFE ALCOHOL  PREP) 70 % PADS USE TO TEST BLOOD SUGAR 3 TIMES DAILY. 300 each 2   Alcohol  Swabs  PADS Use to test blood sugar 3 times daily. Dx: E11.9 300 each 2   Blood Glucose Calibration (GLUCOSE CONTROL) SOLN Use to test blood sugar 3 times daily. Dx: E11.9 1 each 2   Blood Glucose Monitoring Suppl (ACCU-CHEK AVIVA PLUS) w/Device KIT USE AS DIRECTED  TO TEST BLOOD GLUCOSE BID E11.9 1 kit 0   Carboxymethylcellul-Glycerin  (LUBRICATING EYE DROPS OP) Place 1 drop into both eyes 3 (three) times daily.     cephALEXin  (KEFLEX ) 250 MG capsule Take 250 mg by mouth at bedtime.     clopidogrel  (PLAVIX ) 75 MG tablet TAKE 1 TABLET EVERY DAY 90 tablet 3   dapagliflozin  propanediol (FARXIGA ) 10 MG TABS tablet Take 1 tablet (10 mg total) by mouth daily. 90 tablet 3   dicyclomine  (BENTYL ) 20 MG tablet Take 1 tablet (20 mg total) by mouth 3 (three) times daily as needed for up to 5 days (abdominal cramping). 15 tablet 0   famotidine  (PEPCID ) 20 MG tablet TAKE 1 TABLET EVERY DAY 90 tablet 3   gabapentin  (NEURONTIN ) 100 MG capsule TAKE 1 CAPSULE TWICE DAILY 180 capsule 3   gemfibrozil  (LOPID ) 600 MG tablet Take 1 tablet (600 mg total) by mouth 2 (two) times daily before a meal. 180 tablet 0   Ginger, Zingiber officinalis, (GINGER ROOT PO) Take 1 tablet by mouth at bedtime.     glipiZIDE  (GLUCOTROL ) 5 MG tablet TAKE 1 TABLET TWICE DAILY BEFORE MEALS 180 tablet 3   glucose blood (ACCU-CHEK AVIVA PLUS) test strip TEST BLOOD SUGAR THREE TIMES DAILY 300 strip 12   Insulin  Pen Needle 32G  X 4 MM MISC Used to  inject insulin  1x daily. 100 each 4   Insulin  Syringe-Needle U-100 (INSULIN  SYRINGE 1CC/30GX5/16) 30G X 5/16 1 ML MISC Use to inject insulin  1 time per day. 100 each 2   levothyroxine  (SYNTHROID ) 75 MCG tablet Take 1 tablet (75 mcg total) by mouth daily before breakfast. 90 tablet 2   losartan  (COZAAR ) 50 MG tablet TAKE 1 TABLET EVERY DAY 90 tablet 3   Menthol, Topical Analgesic, (BIOFREEZE ROLL-ON EX) Apply 1 application topically as needed (joint pain).     niacin  (NIASPAN ) 500 MG CR tablet Take 1 tablet (500 mg total) by mouth at bedtime. 30 tablet 3   Omega-3 Fatty Acids  (FISH OIL) 1000 MG CAPS Take 1,000 mg by mouth 2 (two) times a day.      ondansetron  (ZOFRAN ) 4 MG tablet Take 1 tablet (4 mg total) by mouth every 8 (eight) hours as needed for nausea or vomiting. 12 tablet 0   predniSONE  (DELTASONE ) 10 MG tablet Take 20 mg daily for 3 days, then 10 mg daily for 3 days 9 tablet 0   Probiotic Product (PROBIOTIC DAILY PO) Take 1 capsule by mouth daily.     TRESIBA  FLEXTOUCH 100 UNIT/ML FlexTouch Pen INJECT 60 UNITS UNDER THE SKIN AT BEDTIME 60 mL 3   valACYclovir  (VALTREX ) 1000 MG tablet Take 1 tablet (1,000 mg total) by mouth 3 (three) times daily. 21 tablet 0   zinc gluconate 50 MG tablet Take 50 mg by mouth at bedtime.     No current facility-administered medications on file prior to visit.    Review of Systems:  As per HPI- otherwise negative.   Physical Examination: There were no vitals filed for this visit. There were no vitals filed for this visit. There is no height or weight on file to calculate BMI. Ideal Body Weight:    GEN: no acute distress. HEENT: Atraumatic, Normocephalic.  Ears and Nose: No external deformity. CV: RRR, No M/G/R. No JVD. No thrill. No extra heart sounds. PULM: CTA B, no wheezes, crackles, rhonchi. No retractions. No resp. distress. No accessory muscle use. ABD: S, NT, ND, +BS. No rebound. No HSM. EXTR: No c/c/e PSYCH: Normally interactive.  Conversant.    Assessment and Plan: ***  Signed Harlene Schroeder, MD

## 2024-06-25 ENCOUNTER — Encounter: Payer: Self-pay | Admitting: Family Medicine

## 2024-06-25 ENCOUNTER — Ambulatory Visit: Payer: Medicare HMO | Admitting: Family Medicine

## 2024-06-25 VITALS — BP 128/62 | HR 71 | Ht 64.0 in | Wt 181.8 lb

## 2024-06-25 DIAGNOSIS — R7981 Abnormal blood-gas level: Secondary | ICD-10-CM | POA: Diagnosis not present

## 2024-06-25 DIAGNOSIS — M549 Dorsalgia, unspecified: Secondary | ICD-10-CM

## 2024-06-25 DIAGNOSIS — E1142 Type 2 diabetes mellitus with diabetic polyneuropathy: Secondary | ICD-10-CM

## 2024-06-25 DIAGNOSIS — G8929 Other chronic pain: Secondary | ICD-10-CM | POA: Diagnosis not present

## 2024-06-25 MED ORDER — CYCLOBENZAPRINE HCL 10 MG PO TABS: 10.0000 mg | ORAL_TABLET | Freq: Two times a day (BID) | ORAL | 3 refills | Status: AC | PRN

## 2024-06-25 MED ORDER — GABAPENTIN 100 MG PO CAPS: 100.0000 mg | ORAL_CAPSULE | Freq: Two times a day (BID) | ORAL | 1 refills | Status: AC

## 2024-06-25 NOTE — Patient Instructions (Signed)
 Good to see you today- your last A1c looked better than we have seen, good news I sent in the stronger muscle relaxer for you- 10 mg twice daily as needed.  However watch for feeing sleepy!   If all is well please see me in about 6 months

## 2024-08-10 ENCOUNTER — Emergency Department (HOSPITAL_COMMUNITY)
Admission: EM | Admit: 2024-08-10 | Discharge: 2024-08-10 | Disposition: A | Discharge status: Home / Self Care | Attending: Emergency Medicine | Admitting: Emergency Medicine

## 2024-08-10 ENCOUNTER — Other Ambulatory Visit: Payer: Self-pay

## 2024-08-10 ENCOUNTER — Other Ambulatory Visit: Payer: Self-pay | Admitting: Family Medicine

## 2024-08-10 ENCOUNTER — Encounter (HOSPITAL_COMMUNITY): Payer: Self-pay | Admitting: *Deleted

## 2024-08-10 ENCOUNTER — Emergency Department (HOSPITAL_COMMUNITY)

## 2024-08-10 DIAGNOSIS — Z794 Long term (current) use of insulin: Secondary | ICD-10-CM | POA: Diagnosis not present

## 2024-08-10 DIAGNOSIS — E039 Hypothyroidism, unspecified: Secondary | ICD-10-CM

## 2024-08-10 DIAGNOSIS — N3000 Acute cystitis without hematuria: Secondary | ICD-10-CM | POA: Insufficient documentation

## 2024-08-10 DIAGNOSIS — N189 Chronic kidney disease, unspecified: Secondary | ICD-10-CM | POA: Insufficient documentation

## 2024-08-10 DIAGNOSIS — E1122 Type 2 diabetes mellitus with diabetic chronic kidney disease: Secondary | ICD-10-CM | POA: Diagnosis not present

## 2024-08-10 DIAGNOSIS — K8020 Calculus of gallbladder without cholecystitis without obstruction: Secondary | ICD-10-CM | POA: Diagnosis not present

## 2024-08-10 DIAGNOSIS — Z7901 Long term (current) use of anticoagulants: Secondary | ICD-10-CM | POA: Insufficient documentation

## 2024-08-10 DIAGNOSIS — Z853 Personal history of malignant neoplasm of breast: Secondary | ICD-10-CM | POA: Insufficient documentation

## 2024-08-10 DIAGNOSIS — N1330 Unspecified hydronephrosis: Secondary | ICD-10-CM | POA: Diagnosis not present

## 2024-08-10 DIAGNOSIS — R109 Unspecified abdominal pain: Secondary | ICD-10-CM | POA: Diagnosis present

## 2024-08-10 DIAGNOSIS — I10 Essential (primary) hypertension: Secondary | ICD-10-CM | POA: Diagnosis not present

## 2024-08-10 DIAGNOSIS — N2882 Megaloureter: Secondary | ICD-10-CM | POA: Diagnosis not present

## 2024-08-10 DIAGNOSIS — K5730 Diverticulosis of large intestine without perforation or abscess without bleeding: Secondary | ICD-10-CM | POA: Diagnosis not present

## 2024-08-10 LAB — URINALYSIS, ROUTINE W REFLEX MICROSCOPIC
Bilirubin Urine: NEGATIVE
Glucose, UA: NEGATIVE mg/dL
Ketones, ur: NEGATIVE mg/dL
Nitrite: NEGATIVE
Protein, ur: 100 mg/dL — AB
RBC / HPF: >50 RBC/hpf (ref 0–5)
Specific Gravity, Urine: 1.012 (ref 1.005–1.030)
WBC, UA: >50 WBC/hpf (ref 0–5)
pH: 5 (ref 5.0–8.0)

## 2024-08-10 LAB — COMPREHENSIVE METABOLIC PANEL WITH GFR
ALT: 24 U/L (ref 0–44)
AST: 30 U/L (ref 15–41)
Albumin: 3.6 g/dL (ref 3.5–5.0)
Alkaline Phosphatase: 54 U/L (ref 38–126)
Anion gap: 14 (ref 5–15)
BUN: 52 mg/dL — ABNORMAL HIGH (ref 8–23)
CO2: 20 mmol/L — ABNORMAL LOW (ref 22–32)
Calcium: 9.6 mg/dL (ref 8.9–10.3)
Chloride: 103 mmol/L (ref 98–111)
Creatinine, Ser: 1.98 mg/dL — ABNORMAL HIGH (ref 0.44–1.00)
GFR, Estimated: 25 mL/min — ABNORMAL LOW (ref >60)
Glucose, Bld: 134 mg/dL — ABNORMAL HIGH (ref 70–99)
Potassium: 4.8 mmol/L (ref 3.5–5.1)
Sodium: 137 mmol/L (ref 135–145)
Total Bilirubin: 0.7 mg/dL (ref 0.0–1.2)
Total Protein: 8 g/dL (ref 6.5–8.1)

## 2024-08-10 LAB — CBC
HCT: 35.9 % — ABNORMAL LOW (ref 36.0–46.0)
Hemoglobin: 11.6 g/dL — ABNORMAL LOW (ref 12.0–15.0)
MCH: 30.1 pg (ref 26.0–34.0)
MCHC: 32.3 g/dL (ref 30.0–36.0)
MCV: 93.2 fL (ref 80.0–100.0)
Platelets: 374 K/uL (ref 150–400)
RBC: 3.85 MIL/uL — ABNORMAL LOW (ref 3.87–5.11)
RDW: 14.5 % (ref 11.5–15.5)
WBC: 10.1 K/uL (ref 4.0–10.5)
nRBC: 0 % (ref 0.0–0.2)

## 2024-08-10 LAB — I-STAT CHEM 8, ED
BUN: 45 mg/dL — ABNORMAL HIGH (ref 8–23)
Calcium, Ion: 1.14 mmol/L — ABNORMAL LOW (ref 1.15–1.40)
Chloride: 110 mmol/L (ref 98–111)
Creatinine, Ser: 2.3 mg/dL — ABNORMAL HIGH (ref 0.44–1.00)
Glucose, Bld: 139 mg/dL — ABNORMAL HIGH (ref 70–99)
HCT: 35 % — ABNORMAL LOW (ref 36.0–46.0)
Hemoglobin: 11.9 g/dL — ABNORMAL LOW (ref 12.0–15.0)
Potassium: 4.8 mmol/L (ref 3.5–5.1)
Sodium: 139 mmol/L (ref 135–145)
TCO2: 18 mmol/L — ABNORMAL LOW (ref 22–32)

## 2024-08-10 LAB — LIPASE, BLOOD: Lipase: 129 U/L — ABNORMAL HIGH (ref 11–51)

## 2024-08-10 MED ORDER — CEFUROXIME AXETIL 500 MG PO TABS
500.0000 mg | ORAL_TABLET | Freq: Two times a day (BID) | ORAL | 0 refills | Status: AC
Start: 2024-08-10 — End: 2024-08-17

## 2024-08-10 MED ORDER — SODIUM CHLORIDE 0.9 % IV SOLN
1.0000 g | Freq: Once | INTRAVENOUS | Status: AC
  Administered 2024-08-10: 1 g via INTRAVENOUS
  Filled 2024-08-10: qty 10

## 2024-08-10 NOTE — ED Notes (Signed)
 Patient transported to CT

## 2024-08-10 NOTE — ED Notes (Addendum)
 Patient helped into bed, but asked that we do not touch her due to pain from arthritis. Patient independently got into bed.

## 2024-08-10 NOTE — ED Triage Notes (Signed)
 Pt here from home with c/o right side abd pain since Friday and has noticed some blood in her urine , pt states it feels like something fell in her stomach ,

## 2024-08-10 NOTE — ED Provider Notes (Signed)
 [place] [facility]GENC[facility] DEPARTMENT AT [facility] Provider Note   CSN: [id number] Arrival date & time: 08/10/24  1512     Patient presents with: Abdominal Pain and Hematuria   Mandy Kim is a 78 y.o. female.    Abdominal Pain Associated symptoms: hematuria   Hematuria Associated symptoms include abdominal pain.     Patient has a history of chronic kidney disease hyperlipidemia acid reflux, diverticulitis, an abdominal aortic aneurysm, diverticulitis, stress incontinence due to pelvic organ prolapse, diabetes, breast cancer.  Patient has had surgery for incontinence, and abdominal hysterectomy, laparoscopic sacral colpopexy as well as mastectomy.  She presents ED for evaluation of abdominal pain that started on Saturday.  Patient states the pain is on the right side of her abdomen.  She feels like something dropped down in her abdomen.  She has had episodes of nausea vomiting.  No diarrhea.  She is not having any pain with urination but she has noticed some blood in her urine.  Prior to Admission medications   Medication Sig Start Date End Date Taking? Authorizing Provider  cefUROXime  (CEFTIN ) 500 MG tablet Take 1 tablet (500 mg total) by mouth 2 (two) times daily with a meal for 7 days. 08/10/24 08/17/24 Yes Randol Simmonds, MD  Accu-Chek Softclix Lancets lancets TEST BLOOD SUGAR THREE TIMES DAILY 07/20/21   Copland, Harlene BROCKS, MD  Alcohol  Swabs  (DROPSAFE ALCOHOL  PREP) 70 % PADS USE TO TEST BLOOD SUGAR 3 TIMES DAILY. 07/20/21   Copland, Harlene BROCKS, MD  Alcohol  Swabs  PADS Use to test blood sugar 3 times daily. Dx: E11.9 01/14/19   Copland, Harlene BROCKS, MD  Blood Glucose Calibration (GLUCOSE CONTROL) SOLN Use to test blood sugar 3 times daily. Dx: E11.9 12/20/15   Copland, Harlene BROCKS, MD  Blood Glucose Monitoring Suppl (ACCU-CHEK AVIVA PLUS) w/Device KIT USE AS DIRECTED  TO TEST BLOOD GLUCOSE BID E11.9 05/11/21   Copland, Harlene BROCKS, MD  Carboxymethylcellul-Glycerin  (LUBRICATING EYE DROPS OP)  Place 1 drop into both eyes 3 (three) times daily.    [provider]  cephALEXin  (KEFLEX ) 250 MG capsule Take 250 mg by mouth at bedtime. 08/29/23   [provider]  clopidogrel  (PLAVIX ) 75 MG tablet TAKE 1 TABLET EVERY DAY 02/11/24   Copland, Jessica C, MD  cyclobenzaprine  (FLEXERIL ) 10 MG tablet Take 1 tablet (10 mg total) by mouth 2 (two) times daily as needed for muscle spasms. 06/25/24   Copland, Harlene BROCKS, MD  dapagliflozin  propanediol (FARXIGA ) 10 MG TABS tablet Take 1 tablet (10 mg total) by mouth daily. 12/26/23   Copland, Harlene BROCKS, MD  dicyclomine  (BENTYL ) 20 MG tablet Take 1 tablet (20 mg total) by mouth 3 (three) times daily as needed for up to 5 days (abdominal cramping). 11/10/23 06/25/24  Trine Raynell Moder, MD  famotidine  (PEPCID ) 20 MG tablet TAKE 1 TABLET EVERY DAY 04/07/24   Copland, Harlene BROCKS, MD  gabapentin  (NEURONTIN ) 100 MG capsule Take 1 capsule (100 mg total) by mouth 2 (two) times daily. 06/25/24   Copland, Harlene BROCKS, MD  gemfibrozil  (LOPID ) 600 MG tablet Take 1 tablet (600 mg total) by mouth 2 (two) times daily before a meal. 06/20/24   Copland, Harlene BROCKS, MD  Ginger, Zingiber officinalis, (GINGER ROOT PO) Take 1 tablet by mouth at bedtime.    [provider]  glipiZIDE  (GLUCOTROL ) 5 MG tablet TAKE 1 TABLET TWICE DAILY BEFORE MEALS 11/12/23   Copland, Jessica C, MD  glucose blood (ACCU-CHEK AVIVA PLUS) test strip TEST BLOOD  SUGAR THREE TIMES DAILY 08/22/23   Copland, Harlene BROCKS, MD  Insulin  Pen Needle 32G X 4 MM MISC Used to inject insulin  1x daily. 10/18/17   Kassie Mallick, MD  Insulin  Syringe-Needle U-100 (INSULIN  SYRINGE 1CC/30GX5/16) 30G X 5/16 1 ML MISC Use to inject insulin  1 time per day. 05/22/17   Kassie Mallick, MD  levothyroxine  (SYNTHROID ) 75 MCG tablet Take 1 tablet (75 mcg total) by mouth daily before breakfast. 01/03/24   Copland, Harlene BROCKS, MD  losartan  (COZAAR ) 50 MG tablet TAKE 1 TABLET EVERY DAY 03/10/24   Copland, Jessica C, MD  Menthol,  Topical Analgesic, (BIOFREEZE ROLL-ON EX) Apply 1 application topically as needed (joint pain).    [provider]  niacin  (NIASPAN ) 500 MG CR tablet Take 1 tablet (500 mg total) by mouth at bedtime. 08/25/15   Copland, Harlene BROCKS, MD  Omega-3 Fatty Acids  (FISH OIL) 1000 MG CAPS Take 1,000 mg by mouth 2 (two) times a day.     [provider]  ondansetron  (ZOFRAN ) 4 MG tablet Take 1 tablet (4 mg total) by mouth every 8 (eight) hours as needed for nausea or vomiting. 09/16/22   Nivia Colon, PA-C  predniSONE  (DELTASONE ) 10 MG tablet Take 20 mg daily for 3 days, then 10 mg daily for 3 days 12/20/22   Copland, Jessica C, MD  Probiotic Product (PROBIOTIC DAILY PO) Take 1 capsule by mouth daily.    [provider]  TRESIBA  FLEXTOUCH 100 UNIT/ML FlexTouch Pen INJECT 60 UNITS UNDER THE SKIN AT BEDTIME 03/10/24   Copland, Harlene BROCKS, MD  valACYclovir  (VALTREX ) 1000 MG tablet Take 1 tablet (1,000 mg total) by mouth 3 (three) times daily. 12/12/22   Patt Alm Macho, MD  zinc gluconate 50 MG tablet Take 50 mg by mouth at bedtime.    [provider]    Allergies: Aspirin, Bee venom, Ibuprofen, Ozempic  (0.25 or 0.5 mg-dose) [semaglutide (0.25 or 0.5mg -dos)], Poison oak extract, Statins, Trulicity  [dulaglutide ], Tylenol  [acetaminophen ], and Victoza  [liraglutide ]    Review of Systems  Gastrointestinal:  Positive for abdominal pain.  Genitourinary:  Positive for hematuria.    Updated Vital Signs BP (!) 110/47   Pulse 62   Temp 98.2 F (36.8 C) (Oral)   Resp 19   Ht 1.626 m (5' 4)   Wt 82.5 kg   SpO2 91%   BMI 31.22 kg/m   Physical Exam Vitals and nursing note reviewed.  Constitutional:      General: She is not in acute distress.    Appearance: She is well-developed.  HENT:     Head: Normocephalic and atraumatic.     Right Ear: External ear normal.     Left Ear: External ear normal.  Eyes:     General: No scleral icterus.       Right eye: No discharge.         Left eye: No discharge.     Conjunctiva/sclera: Conjunctivae normal.  Neck:     Trachea: No tracheal deviation.  Cardiovascular:     Rate and Rhythm: Normal rate and regular rhythm.  Pulmonary:     Effort: Pulmonary effort is normal. No respiratory distress.     Breath sounds: Normal breath sounds. No stridor. No wheezing or rales.  Abdominal:     General: Bowel sounds are normal. There is no distension.     Palpations: Abdomen is soft.     Tenderness: There is abdominal tenderness in the right upper quadrant. There is no guarding or rebound.  Musculoskeletal:        General: No tenderness or deformity.     Cervical back: Neck supple.  Skin:    General: Skin is warm and dry.     Findings: No rash.  Neurological:     General: No focal deficit present.     Mental Status: She is alert.     Cranial Nerves: No cranial nerve deficit, dysarthria or facial asymmetry.     Sensory: No sensory deficit.     Motor: No abnormal muscle tone or seizure activity.     Coordination: Coordination normal.  Psychiatric:        Mood and Affect: Mood normal.     (all labs ordered are listed, but only abnormal results are displayed) Labs Reviewed  LIPASE, BLOOD - Abnormal; Notable for the following components:      Result Value   Lipase 129 (*)    All other components within normal limits  COMPREHENSIVE METABOLIC PANEL WITH GFR - Abnormal; Notable for the following components:   CO2 20 (*)    Glucose, Bld 134 (*)    BUN 52 (*)    Creatinine, Ser 1.98 (*)    GFR, Estimated 25 (*)    All other components within normal limits  CBC - Abnormal; Notable for the following components:   RBC 3.85 (*)    Hemoglobin 11.6 (*)    HCT 35.9 (*)    All other components within normal limits  URINALYSIS, ROUTINE W REFLEX MICROSCOPIC - Abnormal; Notable for the following components:   Color, Urine AMBER (*)    APPearance TURBID (*)    Hgb urine dipstick LARGE (*)    Protein, ur 100 (*)    Leukocytes,Ua  LARGE (*)    Bacteria, UA RARE (*)    All other components within normal limits  I-STAT CHEM 8, ED - Abnormal; Notable for the following components:   BUN 45 (*)    Creatinine, Ser 2.30 (*)    Glucose, Bld 139 (*)    Calcium , Ion 1.14 (*)    TCO2 18 (*)    Hemoglobin 11.9 (*)    HCT 35.0 (*)    All other components within normal limits  URINE CULTURE    EKG: EKG Interpretation Date/Time:  Sunday August 10 2024 16:36:53 EDT Ventricular Rate:  65 PR Interval:  231 QRS Duration:  110 QT Interval:  427 QTC Calculation: 444 R Axis:   68  Text Interpretation: Sinus rhythm Prolonged PR interval Low voltage, precordial leads No significant change since last tracing Confirmed by Randol Simmonds [phone]) on 08/10/2024 4:40:18 PM  Radiology: CT ABDOMEN PELVIS WO CONTRAST Result Date: 08/10/2024 CLINICAL DATA:  Abdominal pain, vomiting EXAM: CT ABDOMEN AND PELVIS WITHOUT CONTRAST TECHNIQUE: Multidetector CT imaging of the abdomen and pelvis was performed following the standard protocol without IV contrast. RADIATION DOSE REDUCTION: This exam was performed according to the departmental dose-optimization program which includes automated exposure control, adjustment of the mA and/or kV according to patient size and/or use of iterative reconstruction technique. COMPARISON:  11/10/2023 FINDINGS: Lower chest: No acute abnormality Hepatobiliary: Small layering gallstones within the gallbladder. No biliary ductal dilatation or focal hepatic abnormality. Pancreas: No focal abnormality or ductal dilatation. Spleen: No focal abnormality.  Normal size. Adrenals/Urinary Tract: Adrenal glands normal. New mild right hydronephrosis. Right ureter is mildly dilated to the bladder. No visible obstructing stones. No stones or hydronephrosis on the left. Urinary bladder unremarkable. Stomach/Bowel: Sigmoid diverticulosis. No active diverticulitis. Stomach and small  bowel decompressed, unremarkable. Vascular/Lymphatic: Prior  endograft repair of abdominal aortic aneurysm. Aneurysm sac size 3.7 cm, stable. No adenopathy. Reproductive: Prior hysterectomy.  No adnexal masses. Other: No free fluid or free air. Musculoskeletal: No acute bony abnormality. IMPRESSION: Mild right hydronephrosis of unknown etiology.  No visible stones. Sigmoid diverticulosis. Cholelithiasis.  No evidence of acute cholecystitis. Electronically Signed   By: Franky Crease M.D.   On: 08/10/2024 17:11     Procedures   Medications Ordered in the ED  cefTRIAXone  (ROCEPHIN ) 1 g in sodium chloride  0.9 % 100 mL IVPB (1 g Intravenous New Bag/Given 08/10/24 1920)    Clinical Course as of 08/10/24 1952  Sun Aug 10, 2024  1618 CBC(!) CBC shows mild anemia [JK]  1619 I-stat chem 8, ED (not at [facility], [facility] or [facility])(!) I-STAT Chem-8 shows elevated creatinine at 2.3 [JK]  1628 Patient declines any pain medications [JK]  1733 Lipase, blood(!) Lipase elevated at 129 [JK]  1733 CT scan shows mild right hydronephrosis no visible stones [JK]    Clinical Course User Index [JK] Randol Simmonds, MD                                 Medical Decision Making Differential diagnosis includes but not limited to hepatitis pancreatitis cholecystitis ureteral lithiasis pyelonephritis appendicitis  Problems Addressed: Acute cystitis without hematuria: acute illness or injury that poses a threat to life or bodily functions  Amount and/or Complexity of Data Reviewed Labs: ordered. Decision-making details documented in ED Course. Radiology: ordered and independent interpretation performed.  Risk Prescription drug management.   Patient presented to the ED for evaluation of abdominal pain.  Patient's laboratory test did show elevated creatinine.  However patient does have history of renal insufficiency.  Creatinines previously ranged from 2.0 to 1-year ago to 1.4  Labs did show slightly elevated lipase however this has been noted previously.  Doubt pancreatitis based on her  CT scan finding.  Patient CT scan shows mild right-sided hydronephrosis but no evidence of kidney stone.  There is nones of pancreatitis or cholecystitis.  I suspect the patient's symptoms are related to UTI and possibly early pyonephritis.  Patient did not want any medications for pain while she was in the ED.  Will discharge her home on a course of antibiotics     Final diagnoses:  Acute cystitis without hematuria    ED Discharge Orders          Ordered    cefUROXime  (CEFTIN ) 500 MG tablet  2 times daily with meals        08/10/24 1951               Randol Simmonds, MD 08/10/24 1955

## 2024-08-10 NOTE — Discharge Instructions (Signed)
 You can stop taking the cephalexin  medication while you are taking the antibiotic that I prescribed tonight.  Follow-up with your doctor to make sure the infection resolves.  Return to the ER for fever vomiting or other concerning symptoms.

## 2024-08-14 LAB — CARBAPENEM RESISTANCE PANEL
Carba Resistance IMP Gene: NOT DETECTED
Carba Resistance KPC Gene: NOT DETECTED
Carba Resistance NDM Gene: NOT DETECTED
Carba Resistance OXA48 Gene: NOT DETECTED
Carba Resistance VIM Gene: NOT DETECTED

## 2024-08-15 DIAGNOSIS — N3021 Other chronic cystitis with hematuria: Secondary | ICD-10-CM | POA: Diagnosis not present

## 2024-08-15 DIAGNOSIS — N139 Obstructive and reflux uropathy, unspecified: Secondary | ICD-10-CM | POA: Diagnosis not present

## 2024-08-15 DIAGNOSIS — R3914 Feeling of incomplete bladder emptying: Secondary | ICD-10-CM | POA: Diagnosis not present

## 2024-08-18 ENCOUNTER — Telehealth (HOSPITAL_BASED_OUTPATIENT_CLINIC_OR_DEPARTMENT_OTHER): Payer: Self-pay | Admitting: *Deleted

## 2024-08-18 NOTE — Telephone Encounter (Signed)
 Post ED Visit - Positive Culture Follow-up: Unsuccessful Patient Follow-up  Culture assessed and recommendations reviewed by:  [x]  Vito Ralph, Pharm.D. []  Venetia Gully, Pharm.D., BCPS AQ-ID []  Garrel Crews, Pharm.D., BCPS []  Almarie Lunger, Pharm.D., BCPS []  IXL, [address].D., BCPS, AAHIVP []  Rosaline Bihari, Pharm.D., BCPS, AAHIVP []  Massie Rigg, PharmD []  Jodie Rower, PharmD, BCPS  Positive urine culture  []  Patient discharged without antimicrobial prescription and treatment is now indicated [x]  Organism is resistant to prescribed ED discharge antimicrobial []  Patient with positive blood cultures   Unable to contact patient after 3 attempts, letter will be sent to address on file. Plan: Stop cefuroxime  and start Ciprofloxacin  500 mg po daily x 7 days per EDP, Christian Prosperi, PA-C.  Lorita Barnie Pereyra 08/18/2024, 5:24 PM

## 2024-08-18 NOTE — Progress Notes (Signed)
 ED Antimicrobial Stewardship Positive Culture Follow Up   Mandy Kim is an 78 y.o. female who presented to [facility] on 08/10/2024 with a chief complaint of  Chief Complaint  Patient presents with   Abdominal Pain   Hematuria    Recent Results (from the past 720 hours)  Urine Culture     Status: Abnormal   Collection Time: 08/10/24  6:00 PM   Specimen: Urine, Clean Catch  Result Value Ref Range Status   Specimen Description URINE, CLEAN CATCH  Final   Special Requests   Final    NONE Performed at [facility], [address] [place], [place] [address]    Culture >=100,000 COLONIES/mL ENTEROBACTER SPECIES (A)  Final   Report Status 08/17/2024 FINAL  Final   Organism ID, Bacteria ENTEROBACTER SPECIES (A)  Final      Susceptibility   Enterobacter species - MIC*    CEFEPIME <=0.12 SENSITIVE Sensitive     CIPROFLOXACIN  <=0.06 SENSITIVE Sensitive     GENTAMICIN <=1 SENSITIVE Sensitive     IMIPENEM RESISTANT Resistant     NITROFURANTOIN  64 INTERMEDIATE Intermediate     TRIMETH /SULFA  <=20 SENSITIVE Sensitive     PIP/TAZO Value in next row Resistant ug/mL     >=128 RESISTANTThis is a modified FDA-approved test that has been validated and its performance characteristics determined by the reporting laboratory.  This laboratory is certified under the Clinical Laboratory Improvement Amendments CLIA as qualified to perform high complexity clinical laboratory testing.    MEROPENEM Value in next row Resistant      >=128 RESISTANTThis is a modified FDA-approved test that has been validated and its performance characteristics determined by the reporting laboratory.  This laboratory is certified under the Clinical Laboratory Improvement Amendments CLIA as qualified to perform high complexity clinical laboratory testing.    AMIKACIN Value in next row Sensitive      >=128 RESISTANTThis is a modified FDA-approved test that has been validated and its performance characteristics determined by  the reporting laboratory.  This laboratory is certified under the Clinical Laboratory Improvement Amendments CLIA as qualified to perform high complexity clinical laboratory testing.    CEFTAZIDIME/AVIBACTAM Value in next row Sensitive ug/mL     >=128 RESISTANTThis is a modified FDA-approved test that has been validated and its performance characteristics determined by the reporting laboratory.  This laboratory is certified under the Clinical Laboratory Improvement Amendments CLIA as qualified to perform high complexity clinical laboratory testing.    CEFTOLOZANE/TAZOBACTAM Value in next row Sensitive ug/mL     >=128 RESISTANTThis is a modified FDA-approved test that has been validated and its performance characteristics determined by the reporting laboratory.  This laboratory is certified under the Clinical Laboratory Improvement Amendments CLIA as qualified to perform high complexity clinical laboratory testing.    MEROPENEM/VABORBACTAM Value in next row Sensitive ug/mL     >=128 RESISTANTThis is a modified FDA-approved test that has been validated and its performance characteristics determined by the reporting laboratory.  This laboratory is certified under the Clinical Laboratory Improvement Amendments CLIA as qualified to perform high complexity clinical laboratory testing.    TOBRAMYCIN Value in next row Sensitive      >=128 RESISTANTThis is a modified FDA-approved test that has been validated and its performance characteristics determined by the reporting laboratory.  This laboratory is certified under the Clinical Laboratory Improvement Amendments CLIA as qualified to perform high complexity clinical laboratory testing.    * >=100,000 COLONIES/mL ENTEROBACTER SPECIES  Carbapenem Resistance  Panel     Status: None   Collection Time: 08/10/24  6:00 PM  Result Value Ref Range Status   Carba Resistance IMP Gene NOT DETECTED NOT DETECTED Final   Carba Resistance VIM Gene NOT DETECTED NOT DETECTED  Final   Carba Resistance NDM Gene NOT DETECTED NOT DETECTED Final   Carba Resistance KPC Gene NOT DETECTED NOT DETECTED Final   Carba Resistance OXA48 Gene NOT DETECTED NOT DETECTED Final    Comment: (NOTE) Cepheid Carba-R is an FDA-cleared nucleic acid amplification test  (NAAT)for the detection and differentiation of genes encoding the  most prevalent carbapenemases in bacterial isolate samples. Carbapenemase gene identification and implementation of comprehensive  infection control measures are recommended by the CDC to prevent the  spread of the resistant organisms. Performed at [facility], [address], [place], [place] [address]     [x]  Treated with Cefuroime, organism resistant to prescribed antimicrobial []  Patient discharged originally without antimicrobial agent and treatment is now indicated  New antibiotic prescription: Ciprofloxacin  500mg  po daily x 7 days  ED Provider: Sherlean Carota, PA-C   Lealer Marsland G Hazeline Charnley 08/18/2024, 8:39 AM Clinical Pharmacist Monday - Friday phone -  [phone] Saturday - Sunday phone - [phone]

## 2024-08-23 ENCOUNTER — Telehealth (HOSPITAL_BASED_OUTPATIENT_CLINIC_OR_DEPARTMENT_OTHER): Payer: Self-pay | Admitting: Emergency Medicine

## 2024-08-23 NOTE — Telephone Encounter (Signed)
 Pt called after receiving letter from [facility] RN regarding abnormal labs from ED visit 08/10/2024.  Pt reports that she had follow-up with urologist and he changed her antibiotic. Patient reports improvement after change in antibiotic.

## 2024-09-02 DIAGNOSIS — R399 Unspecified symptoms and signs involving the genitourinary system: Secondary | ICD-10-CM | POA: Diagnosis not present

## 2024-09-02 LAB — URINE CULTURE: Culture: >=100000 — AB

## 2024-09-03 DIAGNOSIS — N3091 Cystitis, unspecified with hematuria: Secondary | ICD-10-CM | POA: Diagnosis not present

## 2024-09-03 DIAGNOSIS — N1339 Other hydronephrosis: Secondary | ICD-10-CM | POA: Diagnosis not present

## 2024-09-03 DIAGNOSIS — R1011 Right upper quadrant pain: Secondary | ICD-10-CM | POA: Diagnosis not present

## 2024-09-03 DIAGNOSIS — N10 Acute tubulo-interstitial nephritis: Secondary | ICD-10-CM | POA: Diagnosis not present

## 2024-09-06 ENCOUNTER — Other Ambulatory Visit: Payer: Self-pay | Admitting: Family Medicine

## 2024-09-06 DIAGNOSIS — E785 Hyperlipidemia, unspecified: Secondary | ICD-10-CM

## 2024-09-06 DIAGNOSIS — E118 Type 2 diabetes mellitus with unspecified complications: Secondary | ICD-10-CM

## 2024-09-17 DIAGNOSIS — Z48812 Encounter for surgical aftercare following surgery on the circulatory system: Secondary | ICD-10-CM | POA: Diagnosis not present

## 2024-09-17 DIAGNOSIS — I7143 Infrarenal abdominal aortic aneurysm, without rupture: Secondary | ICD-10-CM | POA: Diagnosis not present

## 2024-10-02 ENCOUNTER — Other Ambulatory Visit: Payer: Self-pay | Admitting: Family Medicine

## 2024-10-23 DIAGNOSIS — E875 Hyperkalemia: Secondary | ICD-10-CM | POA: Diagnosis not present

## 2024-11-25 ENCOUNTER — Ambulatory Visit: Payer: Self-pay

## 2024-11-25 NOTE — Telephone Encounter (Signed)
 FYI Only or Action Required?: FYI only for provider: appointment scheduled on 11/26/24.  Patient was last seen in primary care on 06/25/2024 by Copland, Harlene BROCKS, MD.  Called Nurse Triage reporting Dysuria.  Symptoms began 3 weeks ago.  Interventions attempted: Rest, hydration, or home remedies.  Symptoms are: gradually worsening.  Triage Disposition: See Physician Within 24 Hours (overriding See HCP Within 4 Hours (Or PCP Triage))  Patient/caregiver understands and will follow disposition?: Yes           Copied from CRM #[id number]. Topic: Clinical - Red Word Triage >> Nov 25, 2024 10:09 AM Tinnie BROCKS wrote: Red Word that prompted transfer to Nurse Triage: For 3 weeks pt has had pain with urination, cloudy and smelly urine. Reason for Disposition  Diabetes mellitus or weak immune system (e.g., HIV positive, cancer chemo, splenectomy, organ transplant, chronic steroids)  Answer Assessment - Initial Assessment Questions No appointments available at PCP office or regional offices until tomorrow. RN offered urgent care visit today. Patient declined and states she would like to see her PCP tomorrow. Scheduled and advised patient to call back for new or worsening symptoms.  1. SEVERITY: How bad is the pain?  (e.g., Scale 1-10; mild, moderate, or severe)     5/10.  2. FREQUENCY: How many times have you had painful urination today?      Yes.  3. PATTERN: Is pain present every time you urinate or just sometimes?      Sometimes.  4. ONSET: When did the painful urination start?      3 weeks ago.  5. FEVER: Do you have a fever? If Yes, ask: What is your temperature, how was it measured, and when did it start?     Unsure. Hasn't checked.  6. PAST UTI: Have you had a urine infection before? If Yes, ask: When was the last time? and What happened that time?      Yes, chronic UTIs.  7. CAUSE: What do you think is causing the painful urination?  (e.g., UTI, scratch,  Herpes sore)     UTI.  8. OTHER SYMPTOMS: Do you have any other symptoms? (e.g., blood in urine, flank pain, genital sores, urgency, vaginal discharge)     Nausea, milky colored urine with foul odor, chills, urinary incontinence (states worse from her baseline). No vomiting,blood in urine.  Protocols used: Urination Pain - Female-A-AH

## 2024-11-25 NOTE — Progress Notes (Addendum)
 [facility] [address], [address] [place], [place] [address] [phone]  Date:  11/26/2024   Name:  Mandy Kim   DOB:  14-Jun-1946   MRN:  [id number]  PCP:  Watt Harlene BROCKS, MD    Chief Complaint: Urinary Tract Infection (Onset 3 weeks ago, it has eased up a little /I need a box of needles you screw onto the bottom of a pin )   History of Present Illness:  Mandy Kim is a 78 y.o. very pleasant female patient who presents with the following:  Pt seen today with concern of possible UTI with sx for about 3 weeks  Last seen by me in July  History of poorly controlled diabetes, fatty liver, AAA status post repair 2013, hyperlipidemia, cervical cancer status post hysterectomy year 2000, bilateral breast cancer status post double mastectomy, hypothyroidism  Lab Results  Component Value Date   HGBA1C 6.9 06/05/2024     Discussed the use of AI scribe software for clinical note transcription with the patient, who gave verbal consent to proceed.  History of Present Illness Mandy Kim is an 78 year old female who presents with urinary symptoms including dysuria and incontinence.  She has been experiencing dysuria and increased frequency of urination for the past three weeks. She also notes episodes of incontinence, stating she often cannot make it to the bathroom in time and has to change her clothes frequently. No fever, chills, or hematuria. She mentions a previous episode of blood in her stool, which she attributes to constipation, but states that this has resolved.  She recalls a recent visit to another healthcare provider where she expressed concerns about a possible urinary tract infection, but was advised to drink more water without further diagnostic testing or treatment.  Her blood sugar levels have been fluctuating, with recent readings of 265 mg/dL, 749 mg/dL, 834 mg/dL, and 849 mg/dL. She occasionally  checks her blood sugar levels.  She reports that her feet previously hurt when touched, but they do not hurt like they used to and are not more swollen than usual. She reports occasional pain in her abdomen.  Of note she is taking daily low-dose Keflex  for UTI prophylaxis  Patient Active Problem List   Diagnosis Date Noted   History of sleep apnea 09/27/2023   Edentulous 09/27/2023   Cerebral aneurysm 10/03/2022   BCC (basal cell carcinoma), face 05/18/2022   Carpal tunnel syndrome, bilateral 05/18/2022   Colon polyp 05/18/2022   Herniated intervertebral disk 05/18/2022   Hypokalemia 05/18/2022   Intermittent claudication 05/18/2022   Intracranial aneurysm 05/18/2022   Wears glasses 05/16/2022   Stress incontinence due to pelvic organ prolapse 05/16/2022   PONV (postoperative nausea and vomiting) 05/16/2022   OSA on CPAP 05/16/2022   History of diverticulitis of colon 05/16/2022   History of bilateral breast cancer 05/16/2022   History of basal cell carcinoma excision 05/16/2022   Hiatal hernia 05/16/2022   Diverticulitis 05/16/2022   CKD (chronic kidney disease) 05/16/2022   Chronic cystitis 05/16/2022   Cancer (HCC) 05/16/2022   Anticoagulant long-term use 05/16/2022   Hypoxia 05/05/2022   Acute diarrhea 05/04/2022   Mixed hyperlipidemia due to type 2 diabetes mellitus (HCC) 05/04/2022   Essential hypertension 05/04/2022   GERD without esophagitis 05/04/2022   AKI (acute kidney injury) 05/04/2022   Hypothyroidism 05/04/2022   Dark stools 05/04/2022   Myositis, unspecified 04/27/2020   Prolapse of female  pelvic organs 05/24/2019   Cystocele with prolapse 05/23/2019   Acute respiratory failure with hypoxia (HCC)    Sepsis (HCC) 05/28/2017   Elevated LFTs 05/28/2017   Acute lower UTI 05/28/2017   Dehydration 05/28/2017   Peripheral neuropathy 07/27/2014   Rectocele 04/02/2013   Urge incontinence 04/02/2013   Urinary tract infection, site not specified 04/02/2013    Urinary urgency 04/02/2013   AAA (abdominal aortic aneurysm) (HCC) 02/12/2012   Fatty liver 02/12/2012   Breast cancer (HCC) 02/12/2012   Cervical cancer (HCC) 02/12/2012   Type 2 diabetes mellitus with diabetic polyneuropathy, with long-term current use of insulin  (HCC) 02/12/2012   Hyperlipidemia 02/12/2012   S/P AAA (abdominal aortic aneurysm) repair 09/15/2011   Impaired glucose tolerance 06/26/2011   History of cervical cancer 2000   CHF (congestive heart failure) (HCC) 2000    Past Medical History:  Diagnosis Date   AAA (abdominal aortic aneurysm) 02/12/2012   She is a pt of vascular surgery in [place]- she had a repair done in approx 2013.    Annual US , stent has been in good position   Acute diarrhea 05/04/2022   Acute lower UTI 05/28/2017   Acute respiratory failure with hypoxia (HCC)    AKI (acute kidney injury) 05/04/2022   Anticoagulant long-term use    plavix    Arthritis    Breast cancer (HCC) 02/12/2012   Double mastectomy, 2005.  Now cured   Cancer (HCC)    CHF (congestive heart failure) (HCC) 2000   Chronic cystitis    Chronic kidney disease    Cystocele with prolapse 05/23/2019   Dark stools 05/04/2022   Dehydration 05/28/2017   Diverticulitis    Elevated LFTs 05/28/2017   Essential hypertension 05/04/2022   Fatty liver 02/12/2012   GERD (gastroesophageal reflux disease)    GERD without esophagitis 05/04/2022   Hiatal hernia    History of basal cell carcinoma excision    face/ nose right side   History of bilateral breast cancer followed by oncologist until 2008,  per pt no recurrence   right 2002  and left 2003 s/p  mastectomy with completed chemo and tamexifen therapy    History of cervical cancer 2000   s/p  TAH w/ BSO   History of diverticulitis of colon    Hyperlipidemia    Hypothyroidism    Hypoxia 05/05/2022   Mixed hyperlipidemia due to type 2 diabetes mellitus (HCC) 05/04/2022   OSA on CPAP    PAD (peripheral artery disease)     Peripheral neuropathy    PONV (postoperative nausea and vomiting)    Prolapse of female pelvic organs 05/24/2019   S/P AAA (abdominal aortic aneurysm) repair 09/15/2011   Sepsis (HCC) 05/28/2017   Stress incontinence due to pelvic organ prolapse    Type 2 diabetes mellitus treated with insulin  (HCC)    followed by pcp   Type 2 diabetes mellitus with diabetic polyneuropathy, with long-term current use of insulin  (HCC) 02/12/2012   Wears glasses     Past Surgical History:  Procedure Laterality Date   ABDOMINAL HYSTERECTOMY  2000   w/  BSO,  and Bladder tacking abdominally   CARPAL TUNNEL RELEASE Left 1999   CATARACT EXTRACTION W/ INTRAOCULAR LENS  IMPLANT, BILATERAL  2015   ENDOVASCULAR STENT GRAFT (AAA)  09-15-2011    @[facility]    INCONTINENCE SURGERY  2007  approx.   IR ANGIO INTRA EXTRACRAN SEL COM CAROTID INNOMINATE UNI L MOD SED  05/15/2023   IR ANGIO INTRA EXTRACRAN SEL  INTERNAL CAROTID UNI L MOD SED  04/11/2022   IR ANGIO INTRA EXTRACRAN SEL INTERNAL CAROTID UNI L MOD SED  10/03/2022   IR ANGIO VERTEBRAL SEL VERTEBRAL UNI R MOD SED  04/11/2022   IR ANGIOGRAM FOLLOW UP STUDY  10/03/2022   IR ANGIOGRAM FOLLOW UP STUDY  10/03/2022   IR ANGIOGRAM FOLLOW UP STUDY  10/03/2022   IR NEURO EACH ADD'L AFTER BASIC UNI LEFT (MS)  10/03/2022   IR TRANSCATH/EMBOLIZ  10/03/2022   IR US  GUIDE VASC ACCESS RIGHT  04/11/2022   IR US  GUIDE VASC ACCESS RIGHT  05/15/2023   MASTECTOMY Bilateral right 2002;  left 2003   lymph node dissection done only on right side   RADIOLOGY WITH ANESTHESIA N/A 10/03/2022   Procedure: 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;  Surgeon: Lanis Pupa, MD;  Location: [facility];  Service: Radiology;  Laterality: N/A;   ROBOTIC ASSISTED LAPAROSCOPIC SACROCOLPOPEXY N/A 05/23/2019   Procedure: XI ROBOTIC ASSISTED LAPAROSCOPIC SACROCOLPOPEXY;  Surgeon: Cam Morene ORN, MD;  Location: [facility] ORS;  Service: Urology;  Laterality: N/A;   TONSILLECTOMY  child    TYMPANOPLASTY Right 1970s   had my ear drum replacement with a plastic one   WRIST GANGLION EXCISION Left 1998    Social History[1]  Family History  Problem Relation Age of Onset   Cancer Mother    Pneumonia Mother    Thyroid  disease Mother    Thyroid  disease Sister    Obesity Daughter    Hypertension Daughter    Thyroid  disease Son    Pneumonia Maternal Grandmother    Heart attack Maternal Grandfather    Diabetes Neg Hx     Allergies[2]  Medication list has been reviewed and updated.  Medications Ordered Prior to Encounter[3]  Review of Systems:  As per HPI- otherwise negative.   Physical Examination: Vitals:   11/26/24 1058  BP: (!) 142/62  Pulse: 71  Temp: 97.9 F (36.6 C)  SpO2: 94%   Vitals:   11/26/24 1058  Weight: 178 lb 9.6 oz (81 kg)  Height: 5' 4 (1.626 m)   Body mass index is 30.66 kg/m. Ideal Body Weight: Weight in (lb) to have BMI = 25: 145.3  GEN: no acute distress.  Mildly obese, looks her normal self HEENT: Atraumatic, Normocephalic.  Ears and Nose: No external deformity. CV: RRR, No M/G/R. No JVD. No thrill. No extra heart sounds. PULM: CTA B, no wheezes, crackles, rhonchi. No retractions. No resp. distress. No accessory muscle use. ABD: S, NT, ND, +BS. No rebound. No HSM.  No CVA tenderness EXTR: No c/c/e PSYCH: Normally interactive. Conversant.    Assessment and Plan: Dysuria - Plan: Urine Culture, POCT urinalysis dipstick, amoxicillin -clavulanate (AUGMENTIN ) 500-125 MG tablet  Screening for diabetes mellitus  Type 2 diabetes mellitus with complication, without long-term current use of insulin  (HCC) - Plan: Basic metabolic panel with GFR, Hemoglobin A1c, CANCELED: Hemoglobin A1c, CANCELED: Basic metabolic panel with GFR  Assessment & Plan Urinary tract infection Symptoms suggest UTI. No systemic symptoms or hematuria.  Await urine culture, she has been taking daily Keflex  as a prophylactic medication.  Will start her on  Augmentin  for 5 days.  Urine culture is pending -I have asked her let me know if she is not feeling better in the next day or so  type 2 diabetes mellitus with polyneuropathy Blood glucose fluctuating between 150-265 mg/dL. Follow-up on diabetes labs today  Signed Harlene Schroeder, MD  Received lab and urine culture 12/29 Called  pt and spoke with her  Need to change abx to septra  - called in 3 days of treatment She will see nephrology in the next 1-2 months for kidney recheck  Asked her to see me in 3-4 months and scheduled her so we can follow-up on her DM Asked her to come in for a calcium  level in about one month and she agreed.  Will order  Results for orders placed or performed in visit on 11/26/24  POCT urinalysis dipstick   Collection Time: 11/26/24 11:19 AM  Result Value Ref Range   Color, UA yellow yellow   Clarity, UA cloudy (A) clear   Glucose, UA negative negative mg/dL   Bilirubin, UA negative negative   Ketones, POC UA negative negative mg/dL   Spec Grav, UA 8.989 8.989 - 1.025   Blood, UA negative negative   pH, UA 5.0 5.0 - 8.0   Protein Ur, POC =30 (A) negative mg/dL   Urobilinogen, UA 0.2 0.2 or 1.0 E.U./dL   Nitrite, UA Positive (A) Negative   Leukocytes, UA Large (3+) (A) Negative  Urine Culture   Collection Time: 11/26/24 11:27 AM   Specimen: Urine  Result Value Ref Range   MICRO NUMBER: [id number]    SPECIMEN QUALITY: Adequate    Sample Source NOT GIVEN    STATUS: FINAL    ISOLATE 1: Escherichia coli (A)       Susceptibility   Escherichia coli - URINE CULTURE, REFLEX    AMOX/CLAVULANIC >=32 Resistant     AMPICILLIN/SULBACTAM >=32 Resistant     CEFAZOLIN * >=32 Resistant      * For uncomplicated UTI caused by E. coli, K. pneumoniae or P. mirabilis: Cefazolin  is susceptible if MIC <32 mcg/mL and predicts susceptible to the oral agents cefaclor, cefdinir, cefpodoxime, cefprozil, cefuroxime , cephalexin  and loracarbef.     CEFTAZIDIME >=32 Resistant      CEFEPIME 1 Sensitive     CEFTRIAXONE  >=64 Resistant     CIPROFLOXACIN  <=0.06 Sensitive     LEVOFLOXACIN <=0.12 Sensitive     GENTAMICIN >=16 Resistant     IMIPENEM 1 Sensitive     MEROPENEM <=0.25 Sensitive     NITROFURANTOIN  <=16 Sensitive     PIP/TAZO 64 Intermediate     TRIMETH /SULFA * <=20 Sensitive      * For uncomplicated UTI caused by E. coli, K. pneumoniae or P. mirabilis: Cefazolin  is susceptible if MIC <32 mcg/mL and predicts susceptible to the oral agents cefaclor, cefdinir, cefpodoxime, cefprozil, cefuroxime , cephalexin  and loracarbef. Legend: S = Susceptible  I = Intermediate R = Resistant  NS = Not susceptible SDD = Susceptible Dose Dependent * = Not Tested  NR = Not Reported **NN = See Therapy Comments   Basic metabolic panel with GFR   Collection Time: 11/26/24 11:28 AM  Result Value Ref Range   Glucose, Bld 75 65 - 99 mg/dL   BUN 54 (H) 7 - 25 mg/dL   Creat 7.91 (H) 9.39 - 0.95 mg/dL   eGFR 24 (L) > OR = 60 mL/min/1.1m2   BUN/Creatinine Ratio 26 (H) 6 - 22 (calc)   Sodium 138 135 - 146 mmol/L   Potassium 4.8 3.5 - 5.3 mmol/L   Chloride 106 98 - 110 mmol/L   CO2 21 20 - 32 mmol/L   Calcium  10.7 (H) 8.6 - 10.4 mg/dL  Hemoglobin J8r   Collection Time: 11/26/24 11:28 AM  Result Value Ref Range   Hgb A1c MFr Bld 8.3 (H) <5.7 %   Mean Plasma  Glucose 192 mg/dL   eAG (mmol/L) 89.3 mmol/L        [1]  Social History Tobacco Use   Smoking status: Former    Current packs/day: 0.00    Types: Cigarettes    Start date: 06/06/1969    Quit date: 06/07/1999    Years since quitting: 25.4   Smokeless tobacco: Never  Vaping Use   Vaping status: Never Used  Substance Use Topics   Alcohol  use: Never   Drug use: Never  [2]  Allergies Allergen Reactions   Aspirin     Per pt any pain medications - causes fluid build up    Bee Venom Swelling   Ibuprofen Swelling   Ozempic  (0.25 Or 0.5 Mg-Dose) [Semaglutide (0.25 Or 0.5mg -Dos)] Diarrhea and Nausea And Vomiting     Flatulence   Poison Oak Extract Itching and Swelling   Statins     skin feels like bugs under the skin   Trulicity  [Dulaglutide ] Nausea And Vomiting and Other (See Comments)    Flatulance, deathly sick   Tylenol  [Acetaminophen ] Swelling   Victoza  [Liraglutide ] Other (See Comments)    Stomach upset issues   [3]  Current Outpatient Medications on File Prior to Visit  Medication Sig Dispense Refill   Accu-Chek Softclix Lancets lancets TEST BLOOD SUGAR THREE TIMES DAILY 300 each 1   Alcohol  Swabs  (DROPSAFE ALCOHOL  PREP) 70 % PADS USE TO TEST BLOOD SUGAR 3 TIMES DAILY. 300 each 2   Alcohol  Swabs  PADS Use to test blood sugar 3 times daily. Dx: E11.9 300 each 2   Blood Glucose Calibration (GLUCOSE CONTROL) SOLN Use to test blood sugar 3 times daily. Dx: E11.9 1 each 2   Blood Glucose Monitoring Suppl (ACCU-CHEK AVIVA PLUS) w/Device KIT USE AS DIRECTED  TO TEST BLOOD GLUCOSE BID E11.9 1 kit 0   Carboxymethylcellul-Glycerin  (LUBRICATING EYE DROPS OP) Place 1 drop into both eyes 3 (three) times daily.     cephALEXin  (KEFLEX ) 250 MG capsule Take 250 mg by mouth at bedtime.     clopidogrel  (PLAVIX ) 75 MG tablet TAKE 1 TABLET EVERY DAY 90 tablet 3   cyclobenzaprine  (FLEXERIL ) 10 MG tablet Take 1 tablet (10 mg total) by mouth 2 (two) times daily as needed for muscle spasms. 60 tablet 3   dapagliflozin  propanediol (FARXIGA ) 10 MG TABS tablet Take 1 tablet (10 mg total) by mouth daily. 90 tablet 3   dicyclomine  (BENTYL ) 20 MG tablet Take 1 tablet (20 mg total) by mouth 3 (three) times daily as needed for up to 5 days (abdominal cramping). 15 tablet 0   famotidine  (PEPCID ) 20 MG tablet TAKE 1 TABLET EVERY DAY 90 tablet 3   gabapentin  (NEURONTIN ) 100 MG capsule Take 1 capsule (100 mg total) by mouth 2 (two) times daily. 180 capsule 1   gemfibrozil  (LOPID ) 600 MG tablet TAKE 1 TABLET TWICE DAILY BEFORE MEALS 180 tablet 3   Ginger, Zingiber officinalis, (GINGER ROOT PO) Take 1 tablet by mouth at  bedtime.     glipiZIDE  (GLUCOTROL ) 5 MG tablet TAKE 1 TABLET TWICE DAILY BEFORE MEALS 180 tablet 3   glucose blood (ACCU-CHEK AVIVA PLUS) test strip TEST BLOOD SUGAR THREE TIMES DAILY 300 strip 12   Insulin  Pen Needle 32G X 4 MM MISC Used to inject insulin  1x daily. 100 each 4   Insulin  Syringe-Needle U-100 (INSULIN  SYRINGE 1CC/30GX5/16) 30G X 5/16 1 ML MISC Use to inject insulin  1 time per day. 100 each 2   levothyroxine  (SYNTHROID ) 75 MCG tablet Take 1  tablet (75 mcg total) by mouth daily before breakfast. 90 tablet 1   losartan  (COZAAR ) 50 MG tablet TAKE 1 TABLET EVERY DAY 90 tablet 3   Menthol, Topical Analgesic, (BIOFREEZE ROLL-ON EX) Apply 1 application topically as needed (joint pain).     niacin  (NIASPAN ) 500 MG CR tablet Take 1 tablet (500 mg total) by mouth at bedtime. 30 tablet 3   Omega-3 Fatty Acids  (FISH OIL) 1000 MG CAPS Take 1,000 mg by mouth 2 (two) times a day.      ondansetron  (ZOFRAN ) 4 MG tablet Take 1 tablet (4 mg total) by mouth every 8 (eight) hours as needed for nausea or vomiting. 12 tablet 0   predniSONE  (DELTASONE ) 10 MG tablet Take 20 mg daily for 3 days, then 10 mg daily for 3 days 9 tablet 0   Probiotic Product (PROBIOTIC DAILY PO) Take 1 capsule by mouth daily.     TRESIBA  FLEXTOUCH 100 UNIT/ML FlexTouch Pen INJECT 60 UNITS UNDER THE SKIN AT BEDTIME 60 mL 3   valACYclovir  (VALTREX ) 1000 MG tablet Take 1 tablet (1,000 mg total) by mouth 3 (three) times daily. 21 tablet 0   zinc gluconate 50 MG tablet Take 50 mg by mouth at bedtime.     No current facility-administered medications on file prior to visit.   "

## 2024-11-26 ENCOUNTER — Ambulatory Visit: Admitting: Family Medicine

## 2024-11-26 ENCOUNTER — Encounter: Payer: Self-pay | Admitting: Family Medicine

## 2024-11-26 VITALS — BP 142/62 | HR 71 | Temp 97.9°F | Ht 64.0 in | Wt 178.6 lb

## 2024-11-26 DIAGNOSIS — R300 Dysuria: Secondary | ICD-10-CM | POA: Diagnosis not present

## 2024-11-26 DIAGNOSIS — Z131 Encounter for screening for diabetes mellitus: Secondary | ICD-10-CM | POA: Diagnosis not present

## 2024-11-26 DIAGNOSIS — E118 Type 2 diabetes mellitus with unspecified complications: Secondary | ICD-10-CM | POA: Diagnosis not present

## 2024-11-26 LAB — POCT URINALYSIS DIP (MANUAL ENTRY)
Bilirubin, UA: NEGATIVE
Blood, UA: NEGATIVE
Glucose, UA: NEGATIVE mg/dL
Ketones, POC UA: NEGATIVE mg/dL
Nitrite, UA: POSITIVE — AB
Protein Ur, POC: 30 mg/dL — AB
Spec Grav, UA: 1.01
Urobilinogen, UA: 0.2 U/dL
pH, UA: 5

## 2024-11-26 MED ORDER — AMOXICILLIN-POT CLAVULANATE 500-125 MG PO TABS: 1.0000 | ORAL_TABLET | Freq: Two times a day (BID) | ORAL | 0 refills | Status: AC

## 2024-11-27 LAB — HEMOGLOBIN A1C
Hgb A1c MFr Bld: 8.3 % — ABNORMAL HIGH
Mean Plasma Glucose: 192 mg/dL
eAG (mmol/L): 10.6 mmol/L

## 2024-11-27 LAB — BASIC METABOLIC PANEL WITH GFR
BUN/Creatinine Ratio: 26 (calc) — ABNORMAL HIGH (ref 6–22)
BUN: 54 mg/dL — ABNORMAL HIGH (ref 7–25)
CO2: 21 mmol/L (ref 20–32)
Calcium: 10.7 mg/dL — ABNORMAL HIGH (ref 8.6–10.4)
Chloride: 106 mmol/L (ref 98–110)
Creat: 2.08 mg/dL — ABNORMAL HIGH (ref 0.60–0.95)
Glucose, Bld: 75 mg/dL (ref 65–99)
Potassium: 4.8 mmol/L (ref 3.5–5.3)
Sodium: 138 mmol/L (ref 135–146)
eGFR: 24 mL/min/1.73m2 — ABNORMAL LOW

## 2024-11-29 LAB — URINE CULTURE
MICRO NUMBER:: 17397051
SPECIMEN QUALITY:: ADEQUATE

## 2024-11-30 ENCOUNTER — Other Ambulatory Visit: Payer: Self-pay | Admitting: Family Medicine

## 2024-11-30 DIAGNOSIS — Z9889 Other specified postprocedural states: Secondary | ICD-10-CM

## 2024-12-01 ENCOUNTER — Other Ambulatory Visit (HOSPITAL_BASED_OUTPATIENT_CLINIC_OR_DEPARTMENT_OTHER): Payer: Self-pay

## 2024-12-01 MED ORDER — SULFAMETHOXAZOLE-TRIMETHOPRIM 800-160 MG PO TABS: 1.0000 | ORAL_TABLET | Freq: Two times a day (BID) | ORAL | 0 refills | Status: AC

## 2024-12-01 NOTE — Addendum Note (Signed)
 Addended by: WATT RAISIN C on: 12/01/2024 12:27 PM   Modules accepted: Orders

## 2024-12-18 ENCOUNTER — Other Ambulatory Visit: Payer: Self-pay | Admitting: Nephrology

## 2024-12-18 DIAGNOSIS — N184 Chronic kidney disease, stage 4 (severe): Secondary | ICD-10-CM

## 2024-12-23 ENCOUNTER — Encounter: Payer: Self-pay | Admitting: Nephrology

## 2024-12-24 ENCOUNTER — Other Ambulatory Visit: Payer: Self-pay | Admitting: Family Medicine

## 2024-12-24 DIAGNOSIS — E118 Type 2 diabetes mellitus with unspecified complications: Secondary | ICD-10-CM

## 2024-12-24 DIAGNOSIS — I10 Essential (primary) hypertension: Secondary | ICD-10-CM

## 2024-12-29 ENCOUNTER — Ambulatory Visit: Admitting: Family Medicine

## 2025-01-07 ENCOUNTER — Other Ambulatory Visit: Payer: Self-pay | Admitting: Family Medicine

## 2025-01-07 DIAGNOSIS — E039 Hypothyroidism, unspecified: Secondary | ICD-10-CM

## 2025-03-11 ENCOUNTER — Ambulatory Visit: Admitting: Family Medicine
